# Patient Record
Sex: Female | Born: 1990 | Race: Black or African American | Hispanic: No | Marital: Married | Smoking: Former smoker
Health system: Southern US, Community
[De-identification: ages and names within clinical notes are randomized; demographics above are authoritative.]

## PROBLEM LIST (undated history)

## (undated) DIAGNOSIS — F32A Depression, unspecified: Secondary | ICD-10-CM

## (undated) DIAGNOSIS — B9689 Other specified bacterial agents as the cause of diseases classified elsewhere: Secondary | ICD-10-CM

## (undated) DIAGNOSIS — K5090 Crohn's disease, unspecified, without complications: Secondary | ICD-10-CM

## (undated) DIAGNOSIS — N760 Acute vaginitis: Secondary | ICD-10-CM

## (undated) DIAGNOSIS — F329 Major depressive disorder, single episode, unspecified: Secondary | ICD-10-CM

## (undated) DIAGNOSIS — J45909 Unspecified asthma, uncomplicated: Secondary | ICD-10-CM

## (undated) DIAGNOSIS — O039 Complete or unspecified spontaneous abortion without complication: Secondary | ICD-10-CM

## (undated) HISTORY — PX: DILATION AND CURETTAGE OF UTERUS: SHX78

## (undated) HISTORY — DX: Unspecified asthma, uncomplicated: J45.909

## (undated) HISTORY — PX: TONSILLECTOMY: SUR1361

## (undated) HISTORY — DX: Crohn's disease, unspecified, without complications: K50.90

## (undated) HISTORY — DX: Complete or unspecified spontaneous abortion without complication: O03.9

---
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
## 2009-06-17 ENCOUNTER — Emergency Department: Payer: Self-pay | Admitting: Emergency Medicine

## 2009-12-27 ENCOUNTER — Emergency Department: Payer: Self-pay | Admitting: Emergency Medicine

## 2010-03-20 ENCOUNTER — Emergency Department: Payer: Self-pay | Admitting: Emergency Medicine

## 2010-05-19 ENCOUNTER — Emergency Department: Payer: Self-pay | Admitting: Unknown Physician Specialty

## 2010-07-04 ENCOUNTER — Emergency Department: Payer: Self-pay | Admitting: Emergency Medicine

## 2011-02-17 ENCOUNTER — Emergency Department: Payer: Self-pay | Admitting: Emergency Medicine

## 2011-04-03 ENCOUNTER — Emergency Department: Payer: Self-pay | Admitting: Emergency Medicine

## 2011-04-03 LAB — COMPREHENSIVE METABOLIC PANEL
Alkaline Phosphatase: 54 U/L (ref 50–136)
Calcium, Total: 9.1 mg/dL (ref 8.5–10.1)
Co2: 25 mmol/L (ref 21–32)
Creatinine: 0.62 mg/dL (ref 0.60–1.30)
EGFR (African American): >60
EGFR (Non-African Amer.): >60
Osmolality: 281 (ref 275–301)
SGOT(AST): 23 U/L (ref 15–37)
SGPT (ALT): 36 U/L

## 2011-04-03 LAB — URINALYSIS, COMPLETE
Ketone: NEGATIVE
Nitrite: NEGATIVE
Ph: 5 (ref 4.5–8.0)
Protein: NEGATIVE
Specific Gravity: 1.023 (ref 1.003–1.030)

## 2011-04-03 LAB — CBC
HCT: 37.4 % (ref 35.0–47.0)
MCH: 29 pg (ref 26.0–34.0)
MCV: 90 fL (ref 80–100)
Platelet: 276 10*3/uL (ref 150–440)
RBC: 4.18 10*6/uL (ref 3.80–5.20)
WBC: 11.5 10*3/uL — ABNORMAL HIGH (ref 3.6–11.0)

## 2011-04-03 LAB — PREGNANCY, URINE: Pregnancy Test, Urine: NEGATIVE m[IU]/mL

## 2011-05-28 ENCOUNTER — Emergency Department: Payer: Self-pay | Admitting: *Deleted

## 2011-05-31 LAB — BETA STREP CULTURE(ARMC)

## 2011-11-03 ENCOUNTER — Emergency Department: Payer: Self-pay | Admitting: Emergency Medicine

## 2011-11-04 LAB — URINALYSIS, COMPLETE
Bacteria: NONE SEEN
Bilirubin,UR: NEGATIVE
Blood: NEGATIVE
Nitrite: NEGATIVE
Ph: 5 (ref 4.5–8.0)
Protein: NEGATIVE

## 2011-11-04 LAB — PREGNANCY, URINE: Pregnancy Test, Urine: NEGATIVE m[IU]/mL

## 2012-02-09 ENCOUNTER — Emergency Department: Payer: Self-pay | Admitting: Emergency Medicine

## 2012-02-12 LAB — BETA STREP CULTURE(ARMC)

## 2012-03-21 ENCOUNTER — Emergency Department: Payer: Self-pay | Admitting: Emergency Medicine

## 2012-03-21 LAB — COMPREHENSIVE METABOLIC PANEL
Alkaline Phosphatase: 74 U/L (ref 50–136)
BUN: 7 mg/dL (ref 7–18)
Bilirubin,Total: 0.5 mg/dL (ref 0.2–1.0)
Calcium, Total: 9.1 mg/dL (ref 8.5–10.1)
Chloride: 105 mmol/L (ref 98–107)
Co2: 25 mmol/L (ref 21–32)
Creatinine: 0.61 mg/dL (ref 0.60–1.30)
EGFR (African American): >60
EGFR (Non-African Amer.): >60
Glucose: 96 mg/dL (ref 65–99)
Potassium: 3.6 mmol/L (ref 3.5–5.1)
SGPT (ALT): 24 U/L (ref 12–78)
Total Protein: 7.4 g/dL (ref 6.4–8.2)

## 2012-03-21 LAB — URINALYSIS, COMPLETE
Bilirubin,UR: NEGATIVE
Blood: NEGATIVE
Glucose,UR: NEGATIVE mg/dL (ref 0–75)
Ketone: NEGATIVE
Leukocyte Esterase: NEGATIVE
Nitrite: NEGATIVE
Ph: 6 (ref 4.5–8.0)
Protein: NEGATIVE
RBC,UR: 1 /HPF (ref 0–5)
Specific Gravity: 1.014 (ref 1.003–1.030)
WBC UR: 3 /HPF (ref 0–5)

## 2012-03-21 LAB — CBC
MCH: 29 pg (ref 26.0–34.0)
MCHC: 32.7 g/dL (ref 32.0–36.0)
MCV: 89 fL (ref 80–100)
Platelet: 239 10*3/uL (ref 150–440)
RBC: 4.8 10*6/uL (ref 3.80–5.20)
RDW: 14.4 % (ref 11.5–14.5)

## 2012-03-21 LAB — HCG, QUANTITATIVE, PREGNANCY: Beta Hcg, Quant.: <1 m[IU]/mL — ABNORMAL LOW

## 2012-07-25 ENCOUNTER — Emergency Department: Payer: Self-pay | Admitting: Emergency Medicine

## 2012-07-25 LAB — CBC WITH DIFFERENTIAL/PLATELET
Basophil #: 0.2 10*3/uL — ABNORMAL HIGH (ref 0.0–0.1)
Basophil %: 1.1 %
Eosinophil #: 0.6 10*3/uL (ref 0.0–0.7)
Eosinophil %: 4.3 %
HCT: 41.6 % (ref 35.0–47.0)
HGB: 14.1 g/dL (ref 12.0–16.0)
Lymphocyte #: 4 10*3/uL — ABNORMAL HIGH (ref 1.0–3.6)
Lymphocyte %: 28.4 %
MCH: 29.6 pg (ref 26.0–34.0)
MCHC: 34 g/dL (ref 32.0–36.0)
MCV: 87 fL (ref 80–100)
Monocyte %: 6.3 %
Neutrophil #: 8.4 10*3/uL — ABNORMAL HIGH (ref 1.4–6.5)
Neutrophil %: 59.9 %
RBC: 4.77 10*6/uL (ref 3.80–5.20)
RDW: 14.1 % (ref 11.5–14.5)
WBC: 14.1 10*3/uL — ABNORMAL HIGH (ref 3.6–11.0)

## 2012-07-25 LAB — COMPREHENSIVE METABOLIC PANEL
Albumin: 3.7 g/dL (ref 3.4–5.0)
Anion Gap: 3 — ABNORMAL LOW (ref 7–16)
BUN: 9 mg/dL (ref 7–18)
Calcium, Total: 9.3 mg/dL (ref 8.5–10.1)
Chloride: 107 mmol/L (ref 98–107)
Co2: 29 mmol/L (ref 21–32)
EGFR (African American): >60
Glucose: 79 mg/dL (ref 65–99)
SGOT(AST): 23 U/L (ref 15–37)
SGPT (ALT): 28 U/L (ref 12–78)
Sodium: 139 mmol/L (ref 136–145)

## 2012-07-25 LAB — URINALYSIS, COMPLETE
Bacteria: NONE SEEN
Glucose,UR: NEGATIVE mg/dL (ref 0–75)
Ketone: NEGATIVE
Leukocyte Esterase: NEGATIVE
Nitrite: NEGATIVE
Ph: 6 (ref 4.5–8.0)
Protein: NEGATIVE
RBC,UR: 1 /HPF (ref 0–5)

## 2012-07-25 LAB — GC/CHLAMYDIA PROBE AMP

## 2013-01-01 ENCOUNTER — Emergency Department: Payer: Self-pay | Admitting: Emergency Medicine

## 2013-01-01 LAB — COMPREHENSIVE METABOLIC PANEL
Albumin: 4.3 g/dL (ref 3.4–5.0)
Alkaline Phosphatase: 88 U/L (ref 50–136)
Calcium, Total: 9.8 mg/dL (ref 8.5–10.1)
Chloride: 104 mmol/L (ref 98–107)
Creatinine: 0.73 mg/dL (ref 0.60–1.30)
EGFR (African American): >60
EGFR (Non-African Amer.): >60
Glucose: 104 mg/dL — ABNORMAL HIGH (ref 65–99)
SGOT(AST): 26 U/L (ref 15–37)
Sodium: 136 mmol/L (ref 136–145)
Total Protein: 8.4 g/dL — ABNORMAL HIGH (ref 6.4–8.2)

## 2013-01-01 LAB — URINALYSIS, COMPLETE
Leukocyte Esterase: NEGATIVE
Nitrite: NEGATIVE
Ph: 7 (ref 4.5–8.0)
Protein: NEGATIVE
RBC,UR: 1 /HPF (ref 0–5)
Specific Gravity: 1.021 (ref 1.003–1.030)
Squamous Epithelial: 12
WBC UR: 1 /HPF (ref 0–5)

## 2013-01-01 LAB — DRUG SCREEN, URINE
Amphetamines, Ur Screen: NEGATIVE (ref ?–1000)
Barbiturates, Ur Screen: NEGATIVE (ref ?–200)
Benzodiazepine, Ur Scrn: NEGATIVE (ref ?–200)
Cannabinoid 50 Ng, Ur ~~LOC~~: NEGATIVE (ref ?–50)
Methadone, Ur Screen: NEGATIVE (ref ?–300)
Opiate, Ur Screen: NEGATIVE (ref ?–300)
Phencyclidine (PCP) Ur S: NEGATIVE (ref ?–25)
Tricyclic, Ur Screen: NEGATIVE (ref ?–1000)

## 2013-01-01 LAB — CBC
MCH: 30.2 pg (ref 26.0–34.0)
MCV: 88 fL (ref 80–100)
Platelet: 313 10*3/uL (ref 150–440)
RDW: 14.2 % (ref 11.5–14.5)

## 2013-01-01 LAB — WET PREP, GENITAL

## 2013-01-01 LAB — GC/CHLAMYDIA PROBE AMP

## 2013-02-21 ENCOUNTER — Emergency Department: Payer: Self-pay | Admitting: Emergency Medicine

## 2013-02-21 LAB — CBC
HCT: 38.5 % (ref 35.0–47.0)
HGB: 13.2 g/dL (ref 12.0–16.0)
MCH: 30.3 pg (ref 26.0–34.0)
Platelet: 236 10*3/uL (ref 150–440)
RDW: 14 % (ref 11.5–14.5)
WBC: 15.1 10*3/uL — ABNORMAL HIGH (ref 3.6–11.0)

## 2013-02-21 LAB — URINALYSIS, COMPLETE
Bacteria: NONE SEEN
Bilirubin,UR: NEGATIVE
Blood: NEGATIVE
Ph: 7 (ref 4.5–8.0)
Protein: NEGATIVE
RBC,UR: 1 /HPF (ref 0–5)
Specific Gravity: 1.008 (ref 1.003–1.030)

## 2013-02-21 LAB — HCG, QUANTITATIVE, PREGNANCY: Beta Hcg, Quant.: 11734 m[IU]/mL — ABNORMAL HIGH

## 2013-02-21 LAB — WET PREP, GENITAL

## 2013-03-14 ENCOUNTER — Emergency Department: Payer: Self-pay | Admitting: Emergency Medicine

## 2013-03-15 LAB — URINALYSIS, COMPLETE
BILIRUBIN, UR: NEGATIVE
Glucose,UR: NEGATIVE mg/dL (ref 0–75)
Ketone: NEGATIVE
Leukocyte Esterase: NEGATIVE
NITRITE: NEGATIVE
Ph: 8 (ref 4.5–8.0)
Protein: NEGATIVE
SPECIFIC GRAVITY: 1.009 (ref 1.003–1.030)
Squamous Epithelial: 4
WBC UR: 1 /HPF (ref 0–5)

## 2013-03-15 LAB — GC/CHLAMYDIA PROBE AMP

## 2013-03-15 LAB — HCG, QUANTITATIVE, PREGNANCY: Beta Hcg, Quant.: 2987 m[IU]/mL — ABNORMAL HIGH

## 2013-03-15 LAB — CBC
HCT: 36.9 % (ref 35.0–47.0)
HGB: 12.4 g/dL (ref 12.0–16.0)
MCH: 30.1 pg (ref 26.0–34.0)
MCHC: 33.7 g/dL (ref 32.0–36.0)
MCV: 89 fL (ref 80–100)
Platelet: 254 10*3/uL (ref 150–440)
RBC: 4.13 10*6/uL (ref 3.80–5.20)
RDW: 13.9 % (ref 11.5–14.5)
WBC: 19.2 10*3/uL — ABNORMAL HIGH (ref 3.6–11.0)

## 2013-03-15 LAB — WET PREP, GENITAL

## 2013-03-24 ENCOUNTER — Emergency Department: Payer: Self-pay | Admitting: Emergency Medicine

## 2013-03-24 LAB — CBC
HCT: 40.9 % (ref 35.0–47.0)
HGB: 13.7 g/dL (ref 12.0–16.0)
MCH: 30.1 pg (ref 26.0–34.0)
MCHC: 33.6 g/dL (ref 32.0–36.0)
MCV: 90 fL (ref 80–100)
PLATELETS: 282 10*3/uL (ref 150–440)
RBC: 4.56 10*6/uL (ref 3.80–5.20)
RDW: 14.5 % (ref 11.5–14.5)
WBC: 16.9 10*3/uL — ABNORMAL HIGH (ref 3.6–11.0)

## 2013-03-24 LAB — HCG, QUANTITATIVE, PREGNANCY: Beta Hcg, Quant.: 10 m[IU]/mL — ABNORMAL HIGH

## 2013-05-11 ENCOUNTER — Emergency Department: Payer: Self-pay | Admitting: Emergency Medicine

## 2013-05-11 LAB — COMPREHENSIVE METABOLIC PANEL
ALK PHOS: 59 U/L
ALT: 23 U/L (ref 12–78)
Albumin: 3.6 g/dL (ref 3.4–5.0)
Anion Gap: 7 (ref 7–16)
BUN: 7 mg/dL (ref 7–18)
Bilirubin,Total: 0.2 mg/dL (ref 0.2–1.0)
Calcium, Total: 8.5 mg/dL (ref 8.5–10.1)
Chloride: 109 mmol/L — ABNORMAL HIGH (ref 98–107)
Co2: 27 mmol/L (ref 21–32)
Creatinine: 0.61 mg/dL (ref 0.60–1.30)
EGFR (African American): >60
Glucose: 111 mg/dL — ABNORMAL HIGH (ref 65–99)
Osmolality: 284 (ref 275–301)
Potassium: 3.1 mmol/L — ABNORMAL LOW (ref 3.5–5.1)
SGOT(AST): 19 U/L (ref 15–37)
Sodium: 143 mmol/L (ref 136–145)
TOTAL PROTEIN: 6.9 g/dL (ref 6.4–8.2)

## 2013-05-11 LAB — GC/CHLAMYDIA PROBE AMP

## 2013-05-11 LAB — URINALYSIS, COMPLETE
BACTERIA: NONE SEEN
Bilirubin,UR: NEGATIVE
Blood: NEGATIVE
GLUCOSE, UR: NEGATIVE mg/dL (ref 0–75)
Ketone: NEGATIVE
Leukocyte Esterase: NEGATIVE
Nitrite: NEGATIVE
PROTEIN: NEGATIVE
Ph: 5 (ref 4.5–8.0)
RBC,UR: 1 /HPF (ref 0–5)
SPECIFIC GRAVITY: 1.023 (ref 1.003–1.030)
Squamous Epithelial: 3
WBC UR: <1 /HPF (ref 0–5)

## 2013-05-11 LAB — CBC
HCT: 39.6 % (ref 35.0–47.0)
HGB: 13.5 g/dL (ref 12.0–16.0)
MCH: 30.5 pg (ref 26.0–34.0)
MCHC: 34 g/dL (ref 32.0–36.0)
MCV: 90 fL (ref 80–100)
Platelet: 226 10*3/uL (ref 150–440)
RBC: 4.41 10*6/uL (ref 3.80–5.20)
RDW: 13.7 % (ref 11.5–14.5)
WBC: 12.1 10*3/uL — ABNORMAL HIGH (ref 3.6–11.0)

## 2013-05-11 LAB — LIPASE, BLOOD: Lipase: 92 U/L (ref 73–393)

## 2013-05-11 LAB — HCG, QUANTITATIVE, PREGNANCY: Beta Hcg, Quant.: <1 m[IU]/mL — ABNORMAL LOW

## 2013-05-11 LAB — WET PREP, GENITAL

## 2013-07-20 ENCOUNTER — Emergency Department: Payer: Self-pay | Admitting: Emergency Medicine

## 2013-07-20 LAB — CBC WITH DIFFERENTIAL/PLATELET
Basophil #: 0.1 10*3/uL (ref 0.0–0.1)
Basophil %: 0.7 %
EOS ABS: 0.6 10*3/uL (ref 0.0–0.7)
Eosinophil %: 3.4 %
HCT: 40.9 % (ref 35.0–47.0)
HGB: 13.5 g/dL (ref 12.0–16.0)
Lymphocyte #: 6.2 10*3/uL — ABNORMAL HIGH (ref 1.0–3.6)
Lymphocyte %: 34.7 %
MCH: 29.8 pg (ref 26.0–34.0)
MCHC: 33.1 g/dL (ref 32.0–36.0)
MCV: 90 fL (ref 80–100)
MONO ABS: 1.3 x10 3/mm — AB (ref 0.2–0.9)
Monocyte %: 7 %
NEUTROS ABS: 9.6 10*3/uL — AB (ref 1.4–6.5)
NEUTROS PCT: 54.2 %
PLATELETS: 214 10*3/uL (ref 150–440)
RBC: 4.54 10*6/uL (ref 3.80–5.20)
RDW: 14.1 % (ref 11.5–14.5)
WBC: 17.8 10*3/uL — ABNORMAL HIGH (ref 3.6–11.0)

## 2013-07-20 LAB — COMPREHENSIVE METABOLIC PANEL
ALK PHOS: 57 U/L
Albumin: 3.7 g/dL (ref 3.4–5.0)
Anion Gap: 2 — ABNORMAL LOW (ref 7–16)
BUN: 5 mg/dL — ABNORMAL LOW (ref 7–18)
Bilirubin,Total: 0.2 mg/dL (ref 0.2–1.0)
CALCIUM: 9.1 mg/dL (ref 8.5–10.1)
Chloride: 107 mmol/L (ref 98–107)
Co2: 29 mmol/L (ref 21–32)
Creatinine: 0.66 mg/dL (ref 0.60–1.30)
EGFR (African American): >60
EGFR (Non-African Amer.): >60
Glucose: 89 mg/dL (ref 65–99)
Osmolality: 272 (ref 275–301)
Potassium: 3.7 mmol/L (ref 3.5–5.1)
SGOT(AST): 23 U/L (ref 15–37)
SGPT (ALT): 23 U/L (ref 12–78)
Sodium: 138 mmol/L (ref 136–145)
Total Protein: 7 g/dL (ref 6.4–8.2)

## 2013-07-20 LAB — URINALYSIS, COMPLETE
BACTERIA: NONE SEEN
BILIRUBIN, UR: NEGATIVE
BLOOD: NEGATIVE
GLUCOSE, UR: NEGATIVE mg/dL (ref 0–75)
KETONE: NEGATIVE
Leukocyte Esterase: NEGATIVE
NITRITE: NEGATIVE
PH: 5 (ref 4.5–8.0)
Protein: NEGATIVE
SPECIFIC GRAVITY: 1.021 (ref 1.003–1.030)
Squamous Epithelial: 8
WBC UR: 2 /HPF (ref 0–5)

## 2013-07-20 LAB — LIPASE, BLOOD: LIPASE: 106 U/L (ref 73–393)

## 2013-07-20 LAB — GC/CHLAMYDIA PROBE AMP

## 2013-07-20 LAB — WET PREP, GENITAL

## 2013-07-24 ENCOUNTER — Emergency Department: Payer: Self-pay | Admitting: Emergency Medicine

## 2013-07-24 LAB — CBC WITH DIFFERENTIAL/PLATELET
BASOS ABS: 0.1 10*3/uL (ref 0.0–0.1)
Basophil %: 0.4 %
Eosinophil #: 0.3 10*3/uL (ref 0.0–0.7)
Eosinophil %: 2 %
HCT: 42 % (ref 35.0–47.0)
HGB: 14 g/dL (ref 12.0–16.0)
LYMPHS ABS: 3.8 10*3/uL — AB (ref 1.0–3.6)
Lymphocyte %: 26.5 %
MCH: 29.8 pg (ref 26.0–34.0)
MCHC: 33.4 g/dL (ref 32.0–36.0)
MCV: 89 fL (ref 80–100)
Monocyte #: 0.8 x10 3/mm (ref 0.2–0.9)
Monocyte %: 5.3 %
NEUTROS ABS: 9.5 10*3/uL — AB (ref 1.4–6.5)
Neutrophil %: 65.8 %
Platelet: 230 10*3/uL (ref 150–440)
RBC: 4.71 10*6/uL (ref 3.80–5.20)
RDW: 14.2 % (ref 11.5–14.5)
WBC: 14.4 10*3/uL — ABNORMAL HIGH (ref 3.6–11.0)

## 2013-07-24 LAB — COMPREHENSIVE METABOLIC PANEL
Albumin: 3.7 g/dL (ref 3.4–5.0)
Alkaline Phosphatase: 69 U/L
Anion Gap: 5 — ABNORMAL LOW (ref 7–16)
BUN: 12 mg/dL (ref 7–18)
Bilirubin,Total: 0.6 mg/dL (ref 0.2–1.0)
Calcium, Total: 9.5 mg/dL (ref 8.5–10.1)
Chloride: 101 mmol/L (ref 98–107)
Co2: 30 mmol/L (ref 21–32)
Creatinine: 0.61 mg/dL (ref 0.60–1.30)
EGFR (African American): >60
Glucose: 112 mg/dL — ABNORMAL HIGH (ref 65–99)
OSMOLALITY: 272 (ref 275–301)
POTASSIUM: 3.4 mmol/L — AB (ref 3.5–5.1)
SGOT(AST): 272 U/L — ABNORMAL HIGH (ref 15–37)
SGPT (ALT): 132 U/L — ABNORMAL HIGH (ref 12–78)
Sodium: 136 mmol/L (ref 136–145)
TOTAL PROTEIN: 7.2 g/dL (ref 6.4–8.2)

## 2013-07-24 LAB — DRUG SCREEN, URINE
AMPHETAMINES, UR SCREEN: NEGATIVE (ref ?–1000)
BARBITURATES, UR SCREEN: NEGATIVE (ref ?–200)
BENZODIAZEPINE, UR SCRN: NEGATIVE (ref ?–200)
Cannabinoid 50 Ng, Ur ~~LOC~~: NEGATIVE (ref ?–50)
Cocaine Metabolite,Ur ~~LOC~~: NEGATIVE (ref ?–300)
MDMA (ECSTASY) UR SCREEN: NEGATIVE (ref ?–500)
METHADONE, UR SCREEN: NEGATIVE (ref ?–300)
OPIATE, UR SCREEN: NEGATIVE (ref ?–300)
Phencyclidine (PCP) Ur S: NEGATIVE (ref ?–25)
Tricyclic, Ur Screen: NEGATIVE (ref ?–1000)

## 2013-07-24 LAB — URINALYSIS, COMPLETE
BILIRUBIN, UR: NEGATIVE
Blood: NEGATIVE
GLUCOSE, UR: NEGATIVE mg/dL (ref 0–75)
Ketone: NEGATIVE
LEUKOCYTE ESTERASE: NEGATIVE
NITRITE: NEGATIVE
Ph: 7 (ref 4.5–8.0)
Protein: NEGATIVE
RBC,UR: 1 /HPF (ref 0–5)
SPECIFIC GRAVITY: 1.005 (ref 1.003–1.030)
Squamous Epithelial: 22

## 2013-10-03 DIAGNOSIS — Z6827 Body mass index (BMI) 27.0-27.9, adult: Secondary | ICD-10-CM | POA: Insufficient documentation

## 2013-10-14 ENCOUNTER — Emergency Department: Payer: Self-pay | Admitting: Emergency Medicine

## 2013-10-14 LAB — COMPREHENSIVE METABOLIC PANEL
ALBUMIN: 3.9 g/dL (ref 3.4–5.0)
ALK PHOS: 68 U/L
Anion Gap: 7 (ref 7–16)
BILIRUBIN TOTAL: 0.2 mg/dL (ref 0.2–1.0)
BUN: 8 mg/dL (ref 7–18)
CALCIUM: 9.2 mg/dL (ref 8.5–10.1)
CO2: 26 mmol/L (ref 21–32)
CREATININE: 0.84 mg/dL (ref 0.60–1.30)
Chloride: 106 mmol/L (ref 98–107)
Glucose: 119 mg/dL — ABNORMAL HIGH (ref 65–99)
Osmolality: 277 (ref 275–301)
Potassium: 3.8 mmol/L (ref 3.5–5.1)
SGOT(AST): 14 U/L — ABNORMAL LOW (ref 15–37)
SGPT (ALT): 25 U/L
Sodium: 139 mmol/L (ref 136–145)
Total Protein: 7.9 g/dL (ref 6.4–8.2)

## 2013-10-14 LAB — URINALYSIS, COMPLETE
BILIRUBIN, UR: NEGATIVE
Bacteria: NONE SEEN
GLUCOSE, UR: NEGATIVE mg/dL (ref 0–75)
Ketone: NEGATIVE
LEUKOCYTE ESTERASE: NEGATIVE
Nitrite: NEGATIVE
Ph: 6 (ref 4.5–8.0)
Protein: NEGATIVE
RBC,UR: 362 /HPF (ref 0–5)
Specific Gravity: 1.021 (ref 1.003–1.030)
Squamous Epithelial: 4
WBC UR: 4 /HPF (ref 0–5)

## 2013-10-14 LAB — CBC
HCT: 43.1 % (ref 35.0–47.0)
HGB: 14.1 g/dL (ref 12.0–16.0)
MCH: 29.4 pg (ref 26.0–34.0)
MCHC: 32.6 g/dL (ref 32.0–36.0)
MCV: 90 fL (ref 80–100)
Platelet: 290 10*3/uL (ref 150–440)
RBC: 4.78 10*6/uL (ref 3.80–5.20)
RDW: 13.9 % (ref 11.5–14.5)
WBC: 15.4 10*3/uL — ABNORMAL HIGH (ref 3.6–11.0)

## 2013-10-14 LAB — WET PREP, GENITAL

## 2013-10-14 LAB — GC/CHLAMYDIA PROBE AMP

## 2014-01-06 ENCOUNTER — Observation Stay: Payer: Self-pay | Admitting: Internal Medicine

## 2014-01-06 LAB — CBC WITH DIFFERENTIAL/PLATELET
Basophil #: 0.1 10*3/uL (ref 0.0–0.1)
Basophil %: 0.6 %
Eosinophil #: 0.8 10*3/uL — ABNORMAL HIGH (ref 0.0–0.7)
Eosinophil %: 3.6 %
HCT: 41.7 % (ref 35.0–47.0)
HGB: 13.8 g/dL (ref 12.0–16.0)
LYMPHS ABS: 5 10*3/uL — AB (ref 1.0–3.6)
LYMPHS PCT: 23.9 %
MCH: 30 pg (ref 26.0–34.0)
MCHC: 33.2 g/dL (ref 32.0–36.0)
MCV: 91 fL (ref 80–100)
Monocyte #: 1.3 x10 3/mm — ABNORMAL HIGH (ref 0.2–0.9)
Monocyte %: 6.1 %
NEUTROS PCT: 65.8 %
Neutrophil #: 13.8 10*3/uL — ABNORMAL HIGH (ref 1.4–6.5)
Platelet: 237 10*3/uL (ref 150–440)
RBC: 4.61 10*6/uL (ref 3.80–5.20)
RDW: 13.9 % (ref 11.5–14.5)
WBC: 21 10*3/uL — ABNORMAL HIGH (ref 3.6–11.0)

## 2014-01-06 LAB — URINALYSIS, COMPLETE
Bilirubin,UR: NEGATIVE
Blood: NEGATIVE
GLUCOSE, UR: NEGATIVE mg/dL (ref 0–75)
Ketone: NEGATIVE
Leukocyte Esterase: NEGATIVE
Nitrite: NEGATIVE
Ph: 5 (ref 4.5–8.0)
Protein: NEGATIVE
Specific Gravity: 1.019 (ref 1.003–1.030)
Squamous Epithelial: 5
WBC UR: 1 /HPF (ref 0–5)

## 2014-01-06 LAB — COMPREHENSIVE METABOLIC PANEL
ALBUMIN: 3.4 g/dL (ref 3.4–5.0)
ALT: 25 U/L
AST: 18 U/L (ref 15–37)
Alkaline Phosphatase: 62 U/L
Anion Gap: 11 (ref 7–16)
BUN: 7 mg/dL (ref 7–18)
Bilirubin,Total: 0.2 mg/dL (ref 0.2–1.0)
CALCIUM: 8.3 mg/dL — AB (ref 8.5–10.1)
CO2: 25 mmol/L (ref 21–32)
Chloride: 106 mmol/L (ref 98–107)
Creatinine: 0.81 mg/dL (ref 0.60–1.30)
EGFR (African American): >60
EGFR (Non-African Amer.): >60
Glucose: 112 mg/dL — ABNORMAL HIGH (ref 65–99)
Osmolality: 282 (ref 275–301)
Potassium: 3.2 mmol/L — ABNORMAL LOW (ref 3.5–5.1)
SODIUM: 142 mmol/L (ref 136–145)
Total Protein: 7.3 g/dL (ref 6.4–8.2)

## 2014-01-06 LAB — LIPASE, BLOOD: LIPASE: 105 U/L (ref 73–393)

## 2014-01-06 LAB — PREGNANCY, URINE: Pregnancy Test, Urine: NEGATIVE m[IU]/mL

## 2014-01-06 LAB — GC/CHLAMYDIA PROBE AMP

## 2014-01-06 LAB — CLOSTRIDIUM DIFFICILE(ARMC)

## 2014-01-09 LAB — STOOL CULTURE

## 2014-02-11 ENCOUNTER — Emergency Department: Payer: Self-pay | Admitting: Emergency Medicine

## 2014-02-11 LAB — URINALYSIS, COMPLETE
BACTERIA: NONE SEEN
BILIRUBIN, UR: NEGATIVE
Blood: NEGATIVE
Glucose,UR: NEGATIVE mg/dL (ref 0–75)
Ketone: NEGATIVE
Leukocyte Esterase: NEGATIVE
Nitrite: NEGATIVE
PH: 6 (ref 4.5–8.0)
Protein: NEGATIVE
Specific Gravity: 1.02 (ref 1.003–1.030)
Squamous Epithelial: 6
WBC UR: 1 /HPF (ref 0–5)

## 2014-02-21 ENCOUNTER — Ambulatory Visit: Payer: Self-pay | Admitting: Gastroenterology

## 2014-02-23 DIAGNOSIS — K5090 Crohn's disease, unspecified, without complications: Secondary | ICD-10-CM

## 2014-02-23 HISTORY — DX: Crohn's disease, unspecified, without complications: K50.90

## 2014-03-23 ENCOUNTER — Emergency Department: Payer: Self-pay | Admitting: Emergency Medicine

## 2014-03-23 LAB — URINALYSIS, COMPLETE
BACTERIA: NONE SEEN
BILIRUBIN, UR: NEGATIVE
BLOOD: NEGATIVE
GLUCOSE, UR: NEGATIVE mg/dL (ref 0–75)
Ketone: NEGATIVE
Leukocyte Esterase: NEGATIVE
Nitrite: NEGATIVE
Ph: 5 (ref 4.5–8.0)
Protein: NEGATIVE
RBC,UR: 1 /HPF (ref 0–5)
Specific Gravity: 1.026 (ref 1.003–1.030)
WBC UR: 1 /HPF (ref 0–5)

## 2014-03-23 LAB — CBC WITH DIFFERENTIAL/PLATELET
BASOS ABS: 0 10*3/uL (ref 0.0–0.1)
Basophil %: 0.3 %
Eosinophil #: 0.5 10*3/uL (ref 0.0–0.7)
Eosinophil %: 2.8 %
HCT: 43.3 % (ref 35.0–47.0)
HGB: 14 g/dL (ref 12.0–16.0)
LYMPHS PCT: 22.2 %
Lymphocyte #: 3.6 10*3/uL (ref 1.0–3.6)
MCH: 29 pg (ref 26.0–34.0)
MCHC: 32.4 g/dL (ref 32.0–36.0)
MCV: 90 fL (ref 80–100)
MONOS PCT: 7.7 %
Monocyte #: 1.2 x10 3/mm — ABNORMAL HIGH (ref 0.2–0.9)
Neutrophil #: 10.9 10*3/uL — ABNORMAL HIGH (ref 1.4–6.5)
Neutrophil %: 67 %
PLATELETS: 264 10*3/uL (ref 150–440)
RBC: 4.84 10*6/uL (ref 3.80–5.20)
RDW: 14.5 % (ref 11.5–14.5)
WBC: 16.2 10*3/uL — AB (ref 3.6–11.0)

## 2014-03-23 LAB — COMPREHENSIVE METABOLIC PANEL
ALBUMIN: 3.6 g/dL (ref 3.4–5.0)
ALT: 20 U/L (ref 14–63)
Alkaline Phosphatase: 57 U/L (ref 46–116)
Anion Gap: 7 (ref 7–16)
BILIRUBIN TOTAL: 0.4 mg/dL (ref 0.2–1.0)
BUN: 8 mg/dL (ref 7–18)
Calcium, Total: 9.3 mg/dL (ref 8.5–10.1)
Chloride: 106 mmol/L (ref 98–107)
Co2: 24 mmol/L (ref 21–32)
Creatinine: 0.77 mg/dL (ref 0.60–1.30)
EGFR (Non-African Amer.): >60
GLUCOSE: 97 mg/dL (ref 65–99)
Osmolality: 272 (ref 275–301)
Potassium: 3.6 mmol/L (ref 3.5–5.1)
SGOT(AST): 22 U/L (ref 15–37)
Sodium: 137 mmol/L (ref 136–145)
TOTAL PROTEIN: 7.6 g/dL (ref 6.4–8.2)

## 2014-03-23 LAB — HCG, QUANTITATIVE, PREGNANCY: Beta Hcg, Quant.: 9 m[IU]/mL — ABNORMAL HIGH

## 2014-03-23 LAB — LIPASE, BLOOD: LIPASE: 79 U/L (ref 73–393)

## 2014-03-27 ENCOUNTER — Emergency Department: Payer: Self-pay | Admitting: Emergency Medicine

## 2014-03-27 LAB — BASIC METABOLIC PANEL
ANION GAP: 8 (ref 7–16)
BUN: 11 mg/dL (ref 7–18)
CHLORIDE: 107 mmol/L (ref 98–107)
CO2: 26 mmol/L (ref 21–32)
CREATININE: 0.83 mg/dL (ref 0.60–1.30)
Calcium, Total: 9 mg/dL (ref 8.5–10.1)
EGFR (African American): >60
EGFR (Non-African Amer.): >60
Glucose: 115 mg/dL — ABNORMAL HIGH (ref 65–99)
Osmolality: 282 (ref 275–301)
POTASSIUM: 3.5 mmol/L (ref 3.5–5.1)
SODIUM: 141 mmol/L (ref 136–145)

## 2014-03-27 LAB — CBC
HCT: 41.4 % (ref 35.0–47.0)
HGB: 13.6 g/dL (ref 12.0–16.0)
MCH: 29.4 pg (ref 26.0–34.0)
MCHC: 32.8 g/dL (ref 32.0–36.0)
MCV: 90 fL (ref 80–100)
PLATELETS: 276 10*3/uL (ref 150–440)
RBC: 4.61 10*6/uL (ref 3.80–5.20)
RDW: 14.2 % (ref 11.5–14.5)
WBC: 19.4 10*3/uL — AB (ref 3.6–11.0)

## 2014-03-27 LAB — HCG, QUANTITATIVE, PREGNANCY: BETA HCG, QUANT.: 1 m[IU]/mL

## 2014-06-16 NOTE — Discharge Summary (Signed)
Dates of Admission and Diagnosis:  Date of Admission 06-Jan-2014   Date of Discharge 08-Jan-2014   Admitting Diagnosis blood in stool   Final Diagnosis Bloody diarrhea. possibel irritable bowel syndrome Exercise indused asthma chronic abdominal pain    Chief Complaint/History of Present Illness a 24 year old African American female who presents to the Emergency Department complaining of abdominal pain for 1 week. The patient has also had bloody diarrhea and some nausea. She admits to some subjective fevers and has seen blood in her stools. The diarrhea is watery, yet painful. She has a remote history of pelvic inflammatory disease and received treatment for that; she denies that her symptoms are similar in any way. Due to her intractable pain and the unclear etiology, the Emergency Department called for admission.   Allergies:  Amoxicillin: Rash  Pertinent Past History:  Pertinent Past History Exercise-induced asthma   Hospital Course:  Hospital Course 24 year old female admitted for abdominal pain and painful bloody diarrhea.   *  Diarrhea.   C. difficile- negative.  afebrile now,   a concern for inflammatory bowel disease/ irritable bowel syndrome.   IV fluids and supportive care.   Appreciated GI consult- Colonoscopy done    No gross findings, Internal hemorrhoids, Biopsy collected.     Suggest to D/c home. Follwo win GI clinic next week for report.  * migraine- worse due to morphin inj, give fioricet.  *  Leukocytosis.  likely inflammatory.  * Exercise-induced asthma. completely stable. The patient has not had to use her inhaler in a very long time.  * Pelvic inflammatory disease.Pt says - this has been ruled out by [employer].     so most likely pain was due to GI origin. *  Deep vein thrombosis prophylaxis - pt is young and ambulatory.   Condition on Discharge Stable   Code Status:  Code Status Full Code   DISCHARGE INSTRUCTIONS HOME MEDS:  Medication  Reconciliation: Patient's Home Medications at Discharge:     Medication Instructions  acetaminophen 325 mg oral tablet  2 tab(s) orally 1 to 4 times a day, As Needed - for Pain     Physician's Instructions:  Diet Regular   Activity Limitations As tolerated   Return to Work Not Applicable   Time frame for Follow Up Appointment 1-2 weeks  Dr. Allen Norris   Other Comments follow in GI office next week.     Allen Norris, Darren(Consultant): [facility], [address], [place], [place], [place] [address], [phone]  TIME SPENT:  Total Time: Greater than 30 minutes   Electronic Signatures: Vaughan Basta (MD)  (Signed [phone] 23:36)  Authored: ADMISSION DATE AND DIAGNOSIS, CHIEF COMPLAINT/HPI, Allergies, PERTINENT PAST HISTORY, HOSPITAL COURSE, DISCHARGE INSTRUCTIONS HOME MEDS, PATIENT INSTRUCTIONS, Follow Up Physician, TIME SPENT   Last Updated: 22-Nov-15 23:36 by Vaughan Basta (MD)

## 2014-06-16 NOTE — Consult Note (Signed)
Chief Complaint:  Subjective/Chief Complaint The patient had diarrhea times 2 yesterday. Reports having a head ache today. Having much less diarrhea since being in the hospital.   VITAL SIGNS/ANCILLARY NOTES: **Vital Signs.:   15-Nov-15 09:23  Vital Signs Type Routine  Temperature Temperature (F) 98.6  Celsius 37  Temperature Source oral  Pulse Pulse 82  Respirations Respirations 18  Systolic BP Systolic BP 95  Diastolic BP (mmHg) Diastolic BP (mmHg) 58  Mean BP 70  Pulse Ox % Pulse Ox % 98  Pulse Ox Activity Level  At rest  Oxygen Delivery Room Air/ 21 %   Brief Assessment:  GEN well developed, well nourished, no acute distress   Respiratory normal resp effort  no use of accessory muscles   Additional Physical Exam Alert and orientated times 3   Lab Results: Routine Micro:  14-Nov-15 16:43   Micro Text Report STOOL COMPREHENSIVE   COMMENT                   HOLDING FOR POSSIBLE PATHOGEN   COMMENT                   NO PATHOGENIC E.COLI DETECTED   COMMENT                   NO CAMPYLOBACTER ANTIGEN DETECTED   ANTIBIOTIC                        Culture Comment HOLDING FOR POSSIBLE PATHOGEN  Culture Comment . NO PATHOGENIC E.COLI DETECTED  Culture Comment    . NO CAMPYLOBACTER ANTIGEN DETECTED  Result(s) reported on 07 Jan 2014 at 09:43AM.   Assessment/Plan:  Assessment/Plan:  Assessment Bloody diarrhea   Plan The patient has been doing well since admission.  She will be set up for a colonoscopy for tomorrow.   Electronic Signatures: Lucilla Lame (MD)  (Signed [phone] 10:54)  Authored: Chief Complaint, VITAL SIGNS/ANCILLARY NOTES, Brief Assessment, Lab Results, Assessment/Plan   Last Updated: 15-Nov-15 10:54 by Lucilla Lame (MD)

## 2014-06-16 NOTE — Consult Note (Signed)
Brief Consult Note: Diagnosis: Patient with abd pain since January that has gotten worse in the last week with now bloody diarrhea and increased WBC's.   Patient was seen by consultant.   Consult note dictated.   Comments: The patient has not had a bowel movement since admission. Now with increased WBC's and lower abd pain and bloody stools. Will continue supportive treatment and plan for a colonoscopy for Monday.  Electronic Signatures: Lucilla Lame (MD)  (Signed [phone] 14:15)  Authored: Brief Consult Note   Last Updated: 14-Nov-15 14:15 by Lucilla Lame (MD)

## 2014-06-16 NOTE — H&P (Signed)
PATIENT NAME:  Kathleen Owen, Kathleen Owen MR#:  [id number] DATE OF BIRTH:  10-30-1990  DATE OF ADMISSION:  01/06/2014  REFERRING PHYSICIAN : Elta Guadeloupe R. Jacqualine Code, MD  PRIMARY CARE PHYSICIAN: Nonlocal.   ADMISSION DIAGNOSIS: Painful diarrhea and abdominal pain.   HISTORY OF PRESENT ILLNESS: This is a 24 year old African American female who presents to the Emergency Department complaining of abdominal pain for 1 week. The patient has also had bloody diarrhea and some nausea. She admits to some subjective fevers and has seen blood in her stools. The diarrhea is watery, yet painful. She has a remote history of pelvic inflammatory disease and received treatment for that; she denies that her symptoms are similar in any way. Due to her intractable pain and the unclear etiology, the Emergency Department called for admission.   REVIEW OF SYSTEMS:  CONSTITUTIONAL: The patient admits to subjective fevers, but denies weakness.  EYES: Denies blurred vision or inflammation.  EARS, NOSE AND THROAT: Denies tinnitus or difficulty swallowing.  RESPIRATORY: Denies cough or shortness of breath.  CARDIOVASCULAR: Denies chest pain or palpitations.  GASTROINTESTINAL: Admits to nausea and abdominal pain, as well as diarrhea and melena and sometimes bright red blood per rectum.  GENITOURINARY: Denies dysuria, increased frequency, or hesitancy.  ENDOCRINE: Denies polyuria or polydipsia.  HEMATOLOGIC AND LYMPHATIC: Admits to easy bruising most of which is on her legs but denies lesions consistent with erythema nodosum.  INTEGUMENT: Denies rashes or lesions.  MUSCULOSKELETAL: Denies myalgias or arthralgias.  NEUROLOGIC: Denies numbness in her extremities or dysarthria.  PSYCHIATRIC: Denies depression or suicidal ideation.   PAST MEDICAL HISTORY: Exercise-induced asthma.   PAST SURGICAL HISTORY: Tonsillectomy and adenoidectomy.   SOCIAL HISTORY: The patient smokes 1/2 pack per day for the last 4 years. She occasionally drinks  alcohol. She denies any drug use.   FAMILY HISTORY: Her great grandmother is deceased of stomach cancer.   MEDICATIONS: None.   ALLERGIES: AMOXICILLIN.   PERTINENT LABORATORY RESULTS AND RADIOGRAPHIC FINDINGS: Urine is negative for infection. White blood cell count is 21, hemoglobin 13.8, hematocrit 41.7, platelet count 237,000. Serum glucose 112, BUN 7, creatinine 0.81, sodium is 142, potassium is 3.2, chloride 106, bicarbonate 25, calcium is 8.3, alkaline phosphatase 62, AST 18, ALT 25, serum albumin is 3.4, lipase is 105. Urine pregnancy test is negative. C. difficile antigen is negative, as well.   CT of the abdomen and pelvis with contrast shows no acute abdominal or pelvic pathology.   PHYSICAL EXAMINATION:  VITAL SIGNS: Temperature is 98.3, pulse 78, respirations 15, blood pressure 118/78, pulse oximetry 100% on room air.  GENERAL: The patient is alert and oriented x 3, in mild discomfort.  HEENT: Normocephalic, atraumatic. Pupils equal, round, and reactive to light and accommodation. Extraocular movements are intact.  Mucous membranes are moist.  NECK: Trachea is midline. No adenopathy.  CHEST: Symmetric and atraumatic.  CARDIOVASCULAR: Regular rate and rhythm. Normal S1, S2. No rubs, clicks, or murmurs appreciated.  LUNGS: Clear to auscultation bilaterally. Normal effort and excursion.  ABDOMEN: Positive bowel sounds. Soft. Diffusely tender, but without guarding or rebound. There is no hepatosplenomegaly. There is no pinpoint tenderness at McBurney point. Murphy sign is negative. The patient does not specifically have periumbilical pain, nor does she have suprapubic pain.  GENITOURINARY: Deferred.  MUSCULOSKELETAL: The patient moves all 4 extremities equally. There is 5/5 strength in the upper and lower extremities bilaterally.  SKIN: No rashes or lesions.  NEUROLOGIC: Cranial nerves II through XII are grossly intact.  PSYCHIATRIC: Mood is  normal. Affect is congruent.   ASSESSMENT  AND PLAN: This is a 24 year old female admitted for abdominal pain and painful bloody diarrhea.  1.  Diarrhea. The patient's stool is watery and occasionally she has bright red blood per rectum, which is painful. She has no history of constipation or trauma to her genitalia, including her anus. The patient has no sick contacts, nor has she has been hunting recently or been interacting with daycare age children to suggest any infectious etiology. The patient's C. difficile assay was negative. She is afebrile now, but admits to subjective fevers. She does not currently meet sepsis criteria, but I have a concern for inflammatory bowel disease. Our goal currently is to manage the patient's cramping, and prevent dehydration by giving her intravenous fluid. Gastrointestinal consult has been ordered.  2.  Leukocytosis. There is a normal differential to the patient's complete blood count. Her leukocytosis is likely inflammatory.  3.  Exercise-induced asthma. This is completely stable. The patient has not had to use her inhaler in a very long time.  4.  Pelvic inflammatory disease. The patient declined a pelvic exam at this time; however, she denies any pelvic symptoms. There is a urine gonorrhea and Chlamydia test pending.  5.  Overweight. The patient's BMI is 28.4; I have encouraged a balanced diet and exercise.  6.  Deep vein thrombosis prophylaxis with SCDs. Will defer anticoagulation at this time, as the patient has bleeding per rectum.  7.  Gastrointestinal prophylaxis is unnecessary, as the patient is not critically ill.  8.  The patient is a Full Code.   TIME SPENT ON ADMISSION ORDERS AND PATIENT CARE: Approximately 35 minutes.     ____________________________ Norva Riffle. Marcille Blanco, MD msd:MT D: 01/06/2014 08:02:06 ET T: 01/06/2014 08:17:24 ET JOB#: [id number]  cc: Norva Riffle. Marcille Blanco, MD, <Dictator> Norva Riffle Robert Sunga MD ELECTRONICALLY SIGNED 01/11/2014 1:29

## 2014-06-16 NOTE — Consult Note (Signed)
PATIENT NAME:  Kathleen Owen, Kathleen Owen MR#:  [id number] DATE OF BIRTH:  08-Apr-1990  DATE OF CONSULTATION:  01/06/2014  CONSULTING PHYSICIAN:  Lucilla Lame, MD  CONSULTING SERVICE: Gastroenterology.    REASON FOR CONSULTATION: Bloody diarrhea.   HISTORY OF PRESENT ILLNESS: This patient is a 24 year old woman who states that she has had abdominal pain since January. She had a miscarriage in January and it was her first pregnancy. The patient states that since then, she has had abdominal pain and has been treated multiple times for PID. The patient states that she is not sure that she actually had PID, but was continually treated for it and states that the pain has not gotten better. Approximately 1 month ago, she started to have diarrhea and within the last week, she has been having some nausea with worsening diarrhea. The patient now reports that the diarrhea has been watery and she had noticed some bleeding with blood mixed with the stools. The patient came to the ER because of intractable pain and diarrhea with rectal bleeding.   PAST MEDICAL HISTORY: Asthma, miscarriage.   SOCIAL HISTORY: The patient smokes one half pack of cigarettes a day. She drinks occasionally.   FAMILY HISTORY: Noncontributory.   MEDICATIONS: None.   ALLERGIES: AMOXICILLIN.   REVIEW OF SYSTEMS: A 10 point review of systems is negative except what was stated above.   PHYSICAL EXAMINATION: VITAL SIGNS: Temperature 98.2, pulse 79, respirations 18, blood pressure 107/69, pulse oximetry 99%.   GENERAL: The patient is sitting in bed in no apparent distress. Alert, oriented x 3.   HEENT: Normocephalic, atraumatic. Extraocular motor intact. Pupils equally round and reactive to light and accommodation without JVD, without lymphadenopathy.   LUNGS: Clear to auscultation bilaterally.   HEART: Regular rate and rhythm without murmurs, rubs or gallops.   ABDOMEN: Soft, nondistended. Mild tenderness diffusely without  hepatosplenomegaly, without rebound, without guarding.   EXTREMITIES: Without cyanosis, clubbing or edema.   NEUROLOGICAL: Grossly intact.   PSYCHIATRIC: The mood normal.   ANCILLARY SERVICES: The patient's white cell count upon admission was 21,000 with normal, hemoglobin and hematocrit. The patient's C. difficile was negative and her LFTs were normal. The patient also had a CT scan of the abdomen that did not show any acute abdominal pelvic pathology.   ASSESSMENT AND PLAN: This patient is a 24 year old woman with 10 months of abdominal pain. The patient now has been having diarrhea for the last month, which has gotten worse in the last week. The patient states she has lost approximately 10 pounds. The patient was concerned because of her bloody diarrhea. She has not had any diarrhea since being admitted this morning and states that the pain is tolerable at the present time. The patient will be set up for a colonoscopy on Monday to rule out inflammatory bowel disease. She does report a long history of having to go to the bathroom shortly after eating, which is worse with stress which indicates the patient likely has underlying irritable bowel syndrome in addition to what is acutely going on with her elevated white cell count and her bloody diarrhea.   Thank you very much for involving me in the care of this patient. If you have any questions, please do not hesitate to call.     ____________________________ Lucilla Lame, MD dw:TT D: 01/06/2014 15:43:14 ET T: 01/06/2014 16:09:35 ET JOB#: [id number]  cc: Lucilla Lame, MD, <Dictator> Lucilla Lame MD ELECTRONICALLY SIGNED 01/07/2014 8:04

## 2014-06-18 LAB — SURGICAL PATHOLOGY

## 2014-06-21 ENCOUNTER — Emergency Department: Admit: 2014-06-21 | Disposition: A | Discharge status: Home / Self Care | Payer: Self-pay | Admitting: Internal Medicine

## 2014-07-15 ENCOUNTER — Emergency Department
Admission: EM | Admit: 2014-07-15 | Discharge: 2014-07-15 | Disposition: A | Discharge status: Home / Self Care | Payer: Self-pay | Attending: Emergency Medicine | Admitting: Emergency Medicine

## 2014-07-15 ENCOUNTER — Emergency Department: Payer: Self-pay

## 2014-07-15 DIAGNOSIS — O200 Threatened abortion: Secondary | ICD-10-CM

## 2014-07-15 DIAGNOSIS — B9689 Other specified bacterial agents as the cause of diseases classified elsewhere: Secondary | ICD-10-CM

## 2014-07-15 DIAGNOSIS — N72 Inflammatory disease of cervix uteri: Secondary | ICD-10-CM

## 2014-07-15 DIAGNOSIS — N760 Acute vaginitis: Secondary | ICD-10-CM

## 2014-07-15 DIAGNOSIS — O23511 Infections of cervix in pregnancy, first trimester: Secondary | ICD-10-CM | POA: Insufficient documentation

## 2014-07-15 DIAGNOSIS — Z79899 Other long term (current) drug therapy: Secondary | ICD-10-CM | POA: Insufficient documentation

## 2014-07-15 DIAGNOSIS — O23591 Infection of other part of genital tract in pregnancy, first trimester: Secondary | ICD-10-CM | POA: Insufficient documentation

## 2014-07-15 DIAGNOSIS — R109 Unspecified abdominal pain: Secondary | ICD-10-CM

## 2014-07-15 DIAGNOSIS — Z3A01 Less than 8 weeks gestation of pregnancy: Secondary | ICD-10-CM | POA: Insufficient documentation

## 2014-07-15 DIAGNOSIS — O26899 Other specified pregnancy related conditions, unspecified trimester: Secondary | ICD-10-CM

## 2014-07-15 DIAGNOSIS — Z880 Allergy status to penicillin: Secondary | ICD-10-CM | POA: Insufficient documentation

## 2014-07-15 DIAGNOSIS — Z3491 Encounter for supervision of normal pregnancy, unspecified, first trimester: Secondary | ICD-10-CM

## 2014-07-15 LAB — URINALYSIS COMPLETE WITH MICROSCOPIC (ARMC ONLY)
Bilirubin Urine: NEGATIVE
Glucose, UA: NEGATIVE mg/dL
Hgb urine dipstick: NEGATIVE
KETONES UR: NEGATIVE mg/dL
Nitrite: NEGATIVE
Protein, ur: 30 mg/dL — AB
Specific Gravity, Urine: 1.029 (ref 1.005–1.030)
pH: 6 (ref 5.0–8.0)

## 2014-07-15 LAB — CBC
HCT: 37.7 % (ref 35.0–47.0)
Hemoglobin: 12.5 g/dL (ref 12.0–16.0)
MCH: 30 pg (ref 26.0–34.0)
MCHC: 33.2 g/dL (ref 32.0–36.0)
MCV: 90.3 fL (ref 80.0–100.0)
Platelets: 256 10*3/uL (ref 150–440)
RBC: 4.18 MIL/uL (ref 3.80–5.20)
RDW: 13.8 % (ref 11.5–14.5)
WBC: 19.7 10*3/uL — AB (ref 3.6–11.0)

## 2014-07-15 LAB — ABO/RH
ABO/RH(D): A POS
ABO/RH(D): A POS

## 2014-07-15 LAB — WET PREP, GENITAL: TRICH WET PREP: NONE SEEN

## 2014-07-15 LAB — CHLAMYDIA/NGC RT PCR (ARMC ONLY)
CHLAMYDIA TR: NOT DETECTED
N GONORRHOEAE: NOT DETECTED

## 2014-07-15 LAB — HCG, QUANTITATIVE, PREGNANCY: hCG, Beta Chain, Quant, S: 3035 m[IU]/mL — ABNORMAL HIGH (ref <5)

## 2014-07-15 MED ORDER — PRENATAL VITAMINS 0.8 MG PO TABS: 1.0000 | ORAL_TABLET | Freq: Every day | ORAL | Status: DC

## 2014-07-15 MED ORDER — CEFTRIAXONE SODIUM 1 G IJ SOLR
500.0000 mg | Freq: Once | INTRAMUSCULAR | Status: AC
  Administered 2014-07-15: 500 mg via INTRAMUSCULAR

## 2014-07-15 MED ORDER — AZITHROMYCIN 250 MG PO TABS
ORAL_TABLET | ORAL | Status: AC
  Administered 2014-07-15: 1000 mg via ORAL
  Filled 2014-07-15: qty 4

## 2014-07-15 MED ORDER — AZITHROMYCIN 250 MG PO TABS
1000.0000 mg | ORAL_TABLET | Freq: Once | ORAL | Status: AC
  Administered 2014-07-15: 1000 mg via ORAL

## 2014-07-15 MED ORDER — METRONIDAZOLE 0.75 % VA GEL
1.0000 | Freq: Two times a day (BID) | VAGINAL | Status: AC
Start: 2014-07-15 — End: 2014-07-22

## 2014-07-15 MED ORDER — LIDOCAINE HCL (PF) 1 % IJ SOLN
INTRAMUSCULAR | Status: AC
  Administered 2014-07-15: 2.1 mL
  Filled 2014-07-15: qty 5

## 2014-07-15 MED ORDER — CEFTRIAXONE SODIUM 1 G IJ SOLR
INTRAMUSCULAR | Status: AC
  Administered 2014-07-15: 500 mg via INTRAMUSCULAR
  Filled 2014-07-15: qty 10

## 2014-07-15 NOTE — ED Notes (Signed)
Report to katie, rn 

## 2014-07-15 NOTE — ED Provider Notes (Signed)
[facility] Emergency Department Provider Note  ____________________________________________  Time seen: 7:10 AM  I have reviewed the triage vital signs and the nursing notes.   HISTORY  Chief Complaint Abdominal Pain; Back Pain; and Vaginal Discharge    HPI Kathleen Owen is a 24 y.o. female who is approximately [redacted] weeks pregnant and reports having low back pain and low pelvic pain described as an ache, as well as a brownish red discharge for one day. She's also had about 3 days of whitish discharge. Prior to this. No fever, chills, or other abdominal pain. She is eating and drinking normally. No hemorrhage. No dizziness, chest pain, shortness of breath. No prenatal care yet    No past medical history on file.  There are no active problems to display for this patient.   No past surgical history on file.  Current Outpatient Rx  Name  Route  Sig  Dispense  Refill  . metroNIDAZOLE (METROGEL) 0.75 % vaginal gel   Vaginal   Place 1 Applicatorful vaginally 2 (two) times daily.   70 g   0   . Prenatal Multivit-Min-Fe-FA (PRENATAL VITAMINS) 0.8 MG tablet   Oral   Take 1 tablet by mouth daily.   30 tablet   2     Allergies Amoxicillin  No family history on file.  Social History History  Substance Use Topics  . Smoking status: Not on file  . Smokeless tobacco: Not on file  . Alcohol Use: Not on file    Review of Systems  Constitutional: No fever or chills. No weight changes Eyes:No blurry vision or double vision.  ENT: No sore throat. Cardiovascular: No chest pain. Respiratory: No dyspnea or cough. Gastrointestinal: Pelvic pain, no vomiting or diarrhea.  No BRBPR or melena. Genitourinary: Negative for dysuria, urinary retention, bloody urine, or difficulty urinating. Musculoskeletal: Negative for back pain. No joint swelling or pain. Skin: Negative for rash. Neurological: Negative for headaches, focal weakness or  numbness. Psychiatric:No anxiety or depression.   Endocrine:No hot/cold intolerance, changes in energy, or sleep difficulty.  10-point ROS otherwise negative.  ____________________________________________   PHYSICAL EXAM:  VITAL SIGNS: ED Triage Vitals  Enc Vitals Group     BP 07/15/14 0152 103/78 mmHg     Pulse Rate 07/15/14 0152 89     Resp 07/15/14 0152 20     Temp 07/15/14 0152 98.3 F (36.8 C)     Temp Source 07/15/14 0152 Oral     SpO2 07/15/14 0152 100 %     Weight 07/15/14 0152 160 lb (72.576 kg)     Height 07/15/14 0152 5\' 3"  (1.6 m)     Head Cir --      Peak Flow --      Pain Score 07/15/14 0152 8     Pain Loc --      Pain Edu? --      Excl. in [place]? --      Constitutional: Alert and oriented. Well appearing and in no distress. Eyes: No scleral icterus. No conjunctival pallor. PERRL. EOMI ENT   Head: Normocephalic and atraumatic.   Nose: No congestion/rhinnorhea. No septal hematoma   Mouth/Throat: MMM, no pharyngeal erythema. No peritonsillar mass. No uvula shift.   Neck: No stridor. No SubQ emphysema. No meningismus. Hematological/Lymphatic/Immunilogical: No cervical lymphadenopathy. Cardiovascular: RRR. Normal and symmetric distal pulses are present in all extremities. No murmurs, rubs, or gallops. Respiratory: Normal respiratory effort without tachypnea nor retractions. Breath sounds are clear and equal bilaterally.  No wheezes/rales/rhonchi. Gastrointestinal: Soft, mild tenderness over the suprapubic area. No distention. There is no CVA tenderness.  No rebound, rigidity, or guarding. Genitourinary: External pelvic exam unremarkable. Speculum exam with sterile gloves reveals mild cervicitis and whitish discharge. Bimanual exam reveals no adnexal tenderness. No CMT and closed cervical os. Musculoskeletal: Nontender with normal range of motion in all extremities. No joint effusions.  No lower extremity tenderness.  No edema. Neurologic:   Normal  speech and language.  CN 2-10 normal. Motor grossly intact. No pronator drift.  Normal gait. No gross focal neurologic deficits are appreciated.  Skin:  Skin is warm, dry and intact. No rash noted.  No petechiae, purpura, or bullae. Psychiatric: Mood and affect are normal. Speech and behavior are normal. Patient exhibits appropriate insight and judgment.  ____________________________________________    LABS (pertinent positives/negatives) (all labs ordered are listed, but only abnormal results are displayed) Labs Reviewed  WET PREP, GENITAL - Abnormal; Notable for the following:    Yeast Wet Prep HPF POC FEW (*)    Clue Cells Wet Prep HPF POC FEW (*)    WBC, Wet Prep HPF POC MANY (*)    All other components within normal limits  CBC - Abnormal; Notable for the following:    WBC 19.7 (*)    All other components within normal limits  HCG, QUANTITATIVE, PREGNANCY - Abnormal; Notable for the following:    hCG, Beta Chain, Quant, S 3035 (*)    All other components within normal limits  URINALYSIS COMPLETEWITH MICROSCOPIC ([facility])  - Abnormal; Notable for the following:    Color, Urine YELLOW (*)    APPearance CLOUDY (*)    Protein, ur 30 (*)    Leukocytes, UA TRACE (*)    Bacteria, UA RARE (*)    Squamous Epithelial / LPF 6-30 (*)    All other components within normal limits  CHLAMYDIA/NGC RT PCR [facility])   ABO/RH  ABO/RH   ____________________________________________   EKG    ____________________________________________    RADIOLOGY  Pelvic ultrasound consistent with IUP without fetal heart rate. There is also a subchorionic hemorrhage.  ____________________________________________   PROCEDURES  ____________________________________________   INITIAL IMPRESSION / ASSESSMENT AND PLAN / ED COURSE  Pertinent labs & imaging results that were available during my care of the patient were reviewed by me and considered in my medical decision making (see  chart for details).  Patient counseled on possible early miscarriage and a subchorionic hemorrhage that may be causing some bloody discharge. Due to the cervicitis on exam and her symptoms, we went ahead and treated her with azithromycin and ceftriaxone. Empirically. The wet prep does also indicate that she likely has bacterial vaginosis so we'll prescribe her metronidazole. The patient is resistant to taking a by mouth due to GI upset, so I'll prescribe a vaginal gel. No evidence of ectopic TOA, sepsis, torsion or PID.  ____________________________________________   FINAL CLINICAL IMPRESSION(S) / ED DIAGNOSES  Final diagnoses:  Bacterial vaginosis  Cervicitis  First trimester pregnancy  Threatened miscarriage in early pregnancy      Carrie Mew, MD 07/15/14 [phone]

## 2014-07-15 NOTE — ED Notes (Signed)
MD at bedside. 

## 2014-07-15 NOTE — ED Notes (Signed)
Pt presents to ER alert and in NAD. Pt states she is [redacted] weeks pregnant and is having abd pain, back pain and vaginal discharge that is "brownish red."

## 2014-07-15 NOTE — Discharge Instructions (Signed)
Abdominal Pain During Pregnancy Abdominal pain is common in pregnancy. Most of the time, it does not cause harm. There are many causes of abdominal pain. Some causes are more serious than others. Some of the causes of abdominal pain in pregnancy are easily diagnosed. Occasionally, the diagnosis takes time to understand. Other times, the cause is not determined. Abdominal pain can be a sign that something is very wrong with the pregnancy, or the pain may have nothing to do with the pregnancy at all. For this reason, always tell your health care provider if you have any abdominal discomfort. HOME CARE INSTRUCTIONS  Monitor your abdominal pain for any changes. The following actions may help to alleviate any discomfort you are experiencing:  Do not have sexual intercourse or put anything in your vagina until your symptoms go away completely.  Get plenty of rest until your pain improves.  Drink clear fluids if you feel nauseous. Avoid solid food as long as you are uncomfortable or nauseous.  Only take over-the-counter or prescription medicine as directed by your health care provider.  Keep all follow-up appointments with your health care provider. SEEK IMMEDIATE MEDICAL CARE IF:  You are bleeding, leaking fluid, or passing tissue from the vagina.  You have increasing pain or cramping.  You have persistent vomiting.  You have painful or bloody urination.  You have a fever.  You notice a decrease in your baby's movements.  You have extreme weakness or feel faint.  You have shortness of breath, with or without abdominal pain.  You develop a severe headache with abdominal pain.  You have abnormal vaginal discharge with abdominal pain.  You have persistent diarrhea.  You have abdominal pain that continues even after rest, or gets worse. MAKE SURE YOU:   Understand these instructions.  Will watch your condition.  Will get help right away if you are not doing well or get  worse. Document Released: 02/09/2005 Document Revised: 11/30/2012 Document Reviewed: 09/08/2012 [facility] Patient Information 2015 [place], [place]. This information is not intended to replace advice given to you by your health care provider. Make sure you discuss any questions you have with your health care provider.  Bacterial Vaginosis Bacterial vaginosis is an infection of the vagina. It happens when too many of certain germs (bacteria) grow in the vagina. HOME CARE  Take your medicine as told by your doctor.  Finish your medicine even if you start to feel better.  Do not have sex until you finish your medicine and are better.  Tell your sex partner that you have an infection. They should see their doctor for treatment.  Practice safe sex. Use condoms. Have only one sex partner. GET HELP IF:  You are not getting better after 3 days of treatment.  You have more grey fluid (discharge) coming from your vagina than before.  You have more pain than before.  You have a fever. MAKE SURE YOU:   Understand these instructions.  Will watch your condition.  Will get help right away if you are not doing well or get worse. Document Released: 11/19/2007 Document Revised: 11/30/2012 Document Reviewed: 09/21/2012 [facility] Patient Information 2015 [place], [place]. This information is not intended to replace advice given to you by your health care provider. Make sure you discuss any questions you have with your health care provider.

## 2014-07-18 ENCOUNTER — Encounter: Payer: Self-pay | Admitting: Emergency Medicine

## 2014-07-18 ENCOUNTER — Emergency Department
Admission: EM | Admit: 2014-07-18 | Discharge: 2014-07-18 | Disposition: A | Discharge status: Home / Self Care | Payer: Medicaid Other | Attending: Emergency Medicine | Admitting: Emergency Medicine

## 2014-07-18 DIAGNOSIS — O039 Complete or unspecified spontaneous abortion without complication: Secondary | ICD-10-CM

## 2014-07-18 DIAGNOSIS — F17210 Nicotine dependence, cigarettes, uncomplicated: Secondary | ICD-10-CM | POA: Insufficient documentation

## 2014-07-18 DIAGNOSIS — Z880 Allergy status to penicillin: Secondary | ICD-10-CM | POA: Insufficient documentation

## 2014-07-18 DIAGNOSIS — O99331 Smoking (tobacco) complicating pregnancy, first trimester: Secondary | ICD-10-CM | POA: Insufficient documentation

## 2014-07-18 DIAGNOSIS — Z3A00 Weeks of gestation of pregnancy not specified: Secondary | ICD-10-CM | POA: Insufficient documentation

## 2014-07-18 DIAGNOSIS — Z79899 Other long term (current) drug therapy: Secondary | ICD-10-CM | POA: Insufficient documentation

## 2014-07-18 HISTORY — DX: Complete or unspecified spontaneous abortion without complication: O03.9

## 2014-07-18 LAB — HCG, QUANTITATIVE, PREGNANCY: hCG, Beta Chain, Quant, S: 1139 m[IU]/mL — ABNORMAL HIGH (ref <5)

## 2014-07-18 MED ORDER — IBUPROFEN 800 MG PO TABS
800.0000 mg | ORAL_TABLET | Freq: Once | ORAL | Status: AC
  Administered 2014-07-18: 800 mg via ORAL

## 2014-07-18 MED ORDER — IBUPROFEN 800 MG PO TABS
ORAL_TABLET | ORAL | Status: AC
  Filled 2014-07-18: qty 1

## 2014-07-18 NOTE — ED Notes (Signed)
Pt began spotting on Monday and came to ED for eval.  Was sent home.  Today pt began passing tissue.  Also c/o vaginal bleeding and lower abdominal pain.

## 2014-07-18 NOTE — Discharge Instructions (Signed)
Please seek medical attention for any high fevers, chest pain, shortness of breath, change in behavior, persistent vomiting, bloody stool or any other new or concerning symptoms.  Miscarriage A miscarriage is the sudden loss of an unborn baby (fetus) before the 20th week of pregnancy. Most miscarriages happen in the first 3 months of pregnancy. Sometimes, it happens before a woman even knows she is pregnant. A miscarriage is also called a "spontaneous miscarriage" or "early pregnancy loss." Having a miscarriage can be an emotional experience. Talk with your caregiver about any questions you may have about miscarrying, the grieving process, and your future pregnancy plans. CAUSES   Problems with the fetal chromosomes that make it impossible for the baby to develop normally. Problems with the baby's genes or chromosomes are most often the result of errors that occur, by chance, as the embryo divides and grows. The problems are not inherited from the parents.  Infection of the cervix or uterus.   Hormone problems.   Problems with the cervix, such as having an incompetent cervix. This is when the tissue in the cervix is not strong enough to hold the pregnancy.   Problems with the uterus, such as an abnormally shaped uterus, uterine fibroids, or congenital abnormalities.   Certain medical conditions.   Smoking, drinking alcohol, or taking illegal drugs.   Trauma.  Often, the cause of a miscarriage is unknown.  SYMPTOMS   Vaginal bleeding or spotting, with or without cramps or pain.  Pain or cramping in the abdomen or lower back.  Passing fluid, tissue, or blood clots from the vagina. DIAGNOSIS  Your caregiver will perform a physical exam. You may also have an ultrasound to confirm the miscarriage. Blood or urine tests may also be ordered. TREATMENT   Sometimes, treatment is not necessary if you naturally pass all the fetal tissue that was in the uterus. If some of the fetus or  placenta remains in the body (incomplete miscarriage), tissue left behind may become infected and must be removed. Usually, a dilation and curettage (D and C) procedure is performed. During a D and C procedure, the cervix is widened (dilated) and any remaining fetal or placental tissue is gently removed from the uterus.  Antibiotic medicines are prescribed if there is an infection. Other medicines may be given to reduce the size of the uterus (contract) if there is a lot of bleeding.  If you have Rh negative blood and your baby was Rh positive, you will need a Rh immunoglobulin shot. This shot will protect any future baby from having Rh blood problems in future pregnancies. HOME CARE INSTRUCTIONS   Your caregiver may order bed rest or may allow you to continue light activity. Resume activity as directed by your caregiver.  Have someone help with home and family responsibilities during this time.   Keep track of the number of sanitary pads you use each day and how soaked (saturated) they are. Write down this information.   Do not use tampons. Do not douche or have sexual intercourse until approved by your caregiver.   Only take over-the-counter or prescription medicines for pain or discomfort as directed by your caregiver.   Do not take aspirin. Aspirin can cause bleeding.   Keep all follow-up appointments with your caregiver.   If you or your partner have problems with grieving, talk to your caregiver or seek counseling to help cope with the pregnancy loss. Allow enough time to grieve before trying to get pregnant again.  SEEK IMMEDIATE  MEDICAL CARE IF:   You have severe cramps or pain in your back or abdomen.  You have a fever.  You pass large blood clots (walnut-sized or larger) ortissue from your vagina. Save any tissue for your caregiver to inspect.   Your bleeding increases.   You have a thick, bad-smelling vaginal discharge.  You become lightheaded, weak, or you  faint.   You have chills.  MAKE SURE YOU:  Understand these instructions.  Will watch your condition.  Will get help right away if you are not doing well or get worse. Document Released: 08/05/2000 Document Revised: 06/06/2012 Document Reviewed: 03/31/2011 [facility] Patient Information 2015 [place], [place]. This information is not intended to replace advice given to you by your health care provider. Make sure you discuss any questions you have with your health care provider.

## 2014-07-18 NOTE — ED Notes (Signed)
Pt passed tissue size of quarter.  md informed ,  No testing ordered.

## 2014-07-18 NOTE — ED Provider Notes (Signed)
[facility] Emergency Department Provider Note    ____________________________________________  Time seen: 1750  I have reviewed the triage vital signs and the nursing notes.   HISTORY  Chief Complaint Vaginal Bleeding   History limited by: Not Limited   HPI Kathleen Owen is a 24 y.o. female presents to the emergency department today because of concerns for passing tissue and lower abdominal pain. Patient was seen recently in the emergency department for brownish red discharge. States had an ultrasound done which did show an intrauterine pregnancy. States today and noticed some bright red clots as well as stringy tissue-like material. States the pain is also been severe. States it reminds her of when she has had previous miscarriages. Denies any fevers, chest pain or shortness of breath.    History reviewed. No pertinent past medical history.  There are no active problems to display for this patient.   Past Surgical History  Procedure Laterality Date  . Tonsillectomy      Current Outpatient Rx  Name  Route  Sig  Dispense  Refill  . Prenatal Multivit-Min-Fe-FA (PRENATAL VITAMINS) 0.8 MG tablet   Oral   Take 1 tablet by mouth daily.   30 tablet   2   . metroNIDAZOLE (METROGEL) 0.75 % vaginal gel   Vaginal   Place 1 Applicatorful vaginally 2 (two) times daily.   70 g   0     Allergies Amoxicillin  History reviewed. No pertinent family history.  Social History History  Substance Use Topics  . Smoking status: Current Every Day Smoker  . Smokeless tobacco: Not on file  . Alcohol Use: No    Review of Systems  Constitutional: Negative for fever. Cardiovascular: Negative for chest pain. Respiratory: Negative for shortness of breath. Gastrointestinal: Lower abdominal pain Genitourinary: Negative for dysuria. Musculoskeletal: Negative for back pain. Skin: Negative for rash. Neurological: Negative for headaches, focal weakness or  numbness.   10-point ROS otherwise negative.  ____________________________________________   PHYSICAL EXAM:  VITAL SIGNS: ED Triage Vitals  Enc Vitals Group     BP 07/18/14 1709 99/78 mmHg     Pulse Rate 07/18/14 1709 93     Resp 07/18/14 1709 20     Temp --      Temp Source 07/18/14 1709 Oral     SpO2 07/18/14 1709 99 %     Weight 07/18/14 1709 160 lb (72.576 kg)     Height 07/18/14 1709 5\' 3"  (1.6 m)     Head Cir --      Peak Flow --      Pain Score 07/18/14 1710 9   Constitutional: Alert and oriented. Well appearing and in no distress. Eyes: Conjunctivae are normal. PERRL. Normal extraocular movements. ENT   Head: Normocephalic and atraumatic.   Nose: No congestion/rhinnorhea.   Mouth/Throat: Mucous membranes are moist.   Neck: No stridor. Hematological/Lymphatic/Immunilogical: No cervical lymphadenopathy. Cardiovascular: Normal rate, regular rhythm.  No murmurs, rubs, or gallops. Respiratory: Normal respiratory effort without tachypnea nor retractions. Breath sounds are clear and equal bilaterally. No wheezes/rales/rhonchi. Gastrointestinal: Soft, minimally tender to palpation lower abdominal area. No rebound. No guarding. Genitourinary: Deferred Musculoskeletal: Normal range of motion in all extremities. No joint effusions.  No lower extremity tenderness nor edema. Neurologic:  Normal speech and language. No gross focal neurologic deficits are appreciated. Speech is normal.  Skin:  Skin is warm, dry and intact. No rash noted. Psychiatric: Mood and affect are normal. Speech and behavior are normal. Patient exhibits  appropriate insight and judgment.  ____________________________________________    LABS (pertinent positives/negatives)  Labs Reviewed  HCG, QUANTITATIVE, PREGNANCY - Abnormal; Notable for the following:    hCG, Beta Chain, Quant, S 1139 (*)    All other components within normal limits   Of note previous hCG 3035 days  ago  ____________________________________________   EKG  None  ____________________________________________    RADIOLOGY  None  ____________________________________________   PROCEDURES  Procedure(s) performed: None  Critical Care performed: No  ____________________________________________   INITIAL IMPRESSION / ASSESSMENT AND PLAN / ED COURSE  Pertinent labs & imaging results that were available during my care of the patient were reviewed by me and considered in my medical decision making (see chart for details).  Patient here with pelvic pain and concern for passing vaginal tissue. Bedside ultrasound does not show an intrauterine gestational sac. Additionally beta hCG has decreased from previous eye 3 days ago. I feel like clinical history and findings consistent with miscarriage. Encourage primary care and OB/GYN follow-up.  ____________________________________________   FINAL CLINICAL IMPRESSION(S) / ED DIAGNOSES  Final diagnoses:  Miscarriage     Nance Pear, MD 07/18/14 [id number]

## 2014-07-30 ENCOUNTER — Encounter: Payer: Self-pay | Admitting: Gastroenterology

## 2014-07-30 ENCOUNTER — Ambulatory Visit (INDEPENDENT_AMBULATORY_CARE_PROVIDER_SITE_OTHER): Payer: Self-pay | Admitting: Gastroenterology

## 2014-07-30 VITALS — BP 115/73 | HR 74 | Temp 98.1°F | Resp 20 | Ht 63.0 in | Wt 154.0 lb

## 2014-07-30 DIAGNOSIS — K5090 Crohn's disease, unspecified, without complications: Secondary | ICD-10-CM

## 2014-07-30 DIAGNOSIS — J45909 Unspecified asthma, uncomplicated: Secondary | ICD-10-CM

## 2014-07-30 HISTORY — DX: Crohn's disease, unspecified, without complications: K50.90

## 2014-07-30 HISTORY — DX: Unspecified asthma, uncomplicated: J45.909

## 2014-07-30 MED ORDER — PREDNISONE 10 MG PO TABS: 40.0000 mg | ORAL_TABLET | Freq: Every day | ORAL | Status: AC

## 2014-07-30 NOTE — Assessment & Plan Note (Addendum)
This patient is a 24 year old woman with presumed Crohn's disease. The site of her Crohn disease has not been ascertained despite upper endoscopy colonoscopy and small bowel follow-through. The patient did very well on steroids in the past. The patient has been told that the feasibility of keeping her on steroids long-term have higher risks. The patient will be started back on steroids to get her symptoms under control. The patient is not having diarrhea at the present time and states she is having constipation. The patient has been told to find a primary care provider to ascertain why she keeps having miscarriages. The patient and her mother have been explained the plan and agree with it.

## 2014-07-30 NOTE — Progress Notes (Signed)
Primary Care Physician: Stoney Bang, MD  Primary Gastroenterologist:  Dr. Lucilla Lame  No chief complaint on file.   HPI: Kathleen Owen is a 24 y.o. female here if lower abdominal pain and a history of likely Crohn's disease. The patient has had a small bowel follow-through upper endoscopy and colonoscopy that did not show any active disease but her CRP has been positive and her blood test for inflammatory bowel disease with consistent with Crohn's disease. The patient has not had biopsy proven Crohn's disease but has been better with steroids. The patient recently had been on steroids and felt well until she got pregnant and ran out of medication. 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 The original abdominal pain was diagnosed for some time as PID but the patient had never gotten better which brought her to me and resulted in this diagnosis.  No current outpatient prescriptions on file.   No current facility-administered medications for this visit.    Allergies as of 07/30/2014 - Review Complete 07/30/2014  Allergen Reaction Noted  . Amoxicillin Rash 07-19-14    ROS:  General: Negative for anorexia, weight loss, fever, chills, fatigue, weakness. ENT: Negative for hoarseness, difficulty swallowing , nasal congestion. CV: Negative for chest pain, angina, palpitations, dyspnea on exertion, peripheral edema.  Respiratory: Negative for dyspnea at rest, dyspnea on exertion, cough, sputum, wheezing.  GI: See history of present illness. GU:  Negative for dysuria, hematuria, urinary incontinence, urinary frequency, nocturnal urination.  Endo: Negative for unusual weight change.    Physical Examination:   BP 115/73 mmHg  Pulse 74  Temp(Src) 98.1 F (36.7 C) (Oral)  Resp 20  Ht 5\' 3"  (1.6 m)  Wt 154 lb (69.854 kg)  BMI 27.29 kg/m2  LMP 05/26/2014 (Exact Date)  Breastfeeding? Unknown  General: Well-nourished, well-developed in no acute distress.  Eyes:  No icterus. Conjunctivae pink. Mouth: Oropharyngeal mucosa moist and pink , no lesions erythema or exudate. Lungs: Clear to auscultation bilaterally. Non-labored. Heart: Regular rate and rhythm, no murmurs rubs or gallops.  Abdomen: Bowel sounds are normal, nontender, nondistended, no hepatosplenomegaly or masses, no abdominal bruits or hernia , no rebound or guarding.   Extremities: No lower extremity edema. No clubbing or deformities. Neuro: Alert and oriented x 3.  Grossly intact. Skin: Warm and dry, no jaundice.   Psych: Alert and cooperative, normal mood and affect.    Imaging Studies: US Ob Comp Less 14 Wks  2014-07-19   CLINICAL DATA:  Pregnant patient with right pelvic and back pain for 3 days. Vaginal spotting.  EXAM: OBSTETRIC <14 WK [place] AND TRANSVAGINAL OB US  TECHNIQUE: Both transabdominal and transvaginal ultrasound examinations were performed for complete evaluation of the gestation as well as the maternal uterus, adnexal regions, and pelvic cul-de-sac. Transvaginal technique was performed to assess early pregnancy.  COMPARISON:  None this pregnancy.  Pelvic ultrasound 03/27/2014  FINDINGS: Intrauterine gestational sac: Visualized, slightly irregular in shape.  Yolk sac:  Present.  Embryo:  Not present.  Cardiac Activity: Not present.  MSD: 6  mm   5 w   2  d  Maternal uterus/adnexae: Small amount of subchorionic hemorrhage measuring 1 x 0.9 x 0.5 cm. Fundal fibroid measuring 2.5 x 2.2 x 2.5 cm is noted with internal vascularity. Right ovary measures 3.5 x 1.6 x 2.5 cm with normal blood flow and appears normal. The left ovary is not visualized. No free fluid in the pelvis.  IMPRESSION: Probable early intrauterine gestational  sac and yolk sac. No fetal pole or cardiac activity yet visualized. Small subchorionic hemorrhage. Recommend follow-up quantitative B-HCG levels and follow-up US in 14 days to confirm and assess viability. This recommendation follows SRU consensus guidelines: Diagnostic  Criteria for Nonviable Pregnancy Early in the First Trimester. [employer] Med 2013; 384:5364-68.   Electronically Signed   By: Jeb Levering M.D.   On: 07/15/2014 05:06   US Ob Transvaginal  07/15/2014   CLINICAL DATA:  Pregnant patient with right pelvic and back pain for 3 days. Vaginal spotting.  EXAM: OBSTETRIC <14 WK [place] AND TRANSVAGINAL OB US  TECHNIQUE: Both transabdominal and transvaginal ultrasound examinations were performed for complete evaluation of the gestation as well as the maternal uterus, adnexal regions, and pelvic cul-de-sac. Transvaginal technique was performed to assess early pregnancy.  COMPARISON:  None this pregnancy.  Pelvic ultrasound 03/27/2014  FINDINGS: Intrauterine gestational sac: Visualized, slightly irregular in shape.  Yolk sac:  Present.  Embryo:  Not present.  Cardiac Activity: Not present.  MSD: 6  mm   5 w   2  d  Maternal uterus/adnexae: Small amount of subchorionic hemorrhage measuring 1 x 0.9 x 0.5 cm. Fundal fibroid measuring 2.5 x 2.2 x 2.5 cm is noted with internal vascularity. Right ovary measures 3.5 x 1.6 x 2.5 cm with normal blood flow and appears normal. The left ovary is not visualized. No free fluid in the pelvis.  IMPRESSION: Probable early intrauterine gestational sac and yolk sac. No fetal pole or cardiac activity yet visualized. Small subchorionic hemorrhage. Recommend follow-up quantitative B-HCG levels and follow-up US in 14 days to confirm and assess viability. This recommendation follows SRU consensus guidelines: Diagnostic Criteria for Nonviable Pregnancy Early in the First Trimester. [employer] Med 2013; 032:1224-82.   Electronically Signed   By: Jeb Levering M.D.   On: 07/15/2014 05:06

## 2014-09-24 DIAGNOSIS — B9689 Other specified bacterial agents as the cause of diseases classified elsewhere: Secondary | ICD-10-CM

## 2014-09-24 HISTORY — DX: Other specified bacterial agents as the cause of diseases classified elsewhere: B96.89

## 2014-10-15 ENCOUNTER — Telehealth: Payer: Self-pay | Admitting: Gastroenterology

## 2014-10-15 NOTE — Telephone Encounter (Signed)
Patient called, Now has insurance. Prednisone doesn't seem to be working, patient said Dr.Wohl mentioned he could try something else if and when she got insurance

## 2014-10-16 NOTE — Telephone Encounter (Signed)
Please let me know if you want her to come back in for an OV. Pt has crohn's disease and we were treating her with prednisone. Her last appt with [place] was January.

## 2014-10-16 NOTE — Telephone Encounter (Signed)
Pt scheduled follow up to discuss treatment for crohn's on Thurs, Sept 1st.

## 2014-10-16 NOTE — Telephone Encounter (Signed)
Please have patient come in for an office visit.

## 2014-10-24 ENCOUNTER — Other Ambulatory Visit: Payer: Self-pay

## 2014-10-25 ENCOUNTER — Encounter: Payer: Self-pay | Admitting: Gastroenterology

## 2014-10-25 ENCOUNTER — Ambulatory Visit (INDEPENDENT_AMBULATORY_CARE_PROVIDER_SITE_OTHER): Payer: Medicaid Other | Admitting: Gastroenterology

## 2014-10-25 VITALS — BP 114/65 | HR 96 | Temp 98.6°F | Ht 63.0 in | Wt 158.0 lb

## 2014-10-25 DIAGNOSIS — K5090 Crohn's disease, unspecified, without complications: Secondary | ICD-10-CM | POA: Diagnosis not present

## 2014-10-25 MED ORDER — MESALAMINE ER 500 MG PO CPCR: 1000.0000 mg | ORAL_CAPSULE | Freq: Four times a day (QID) | ORAL | Status: DC

## 2014-10-25 NOTE — Progress Notes (Signed)
Primary Care Physician: Stoney Bang, MD  Primary Gastroenterologist:  Dr. Lucilla Lame  No chief complaint on file.   HPI: Kathleen Owen is a 24 y.o. female here for follow-up of her Crohn's disease. The patient has serology that suggests that she has Crohn's disease but her endoscopy did not show the source. The patient has also had markers of inflammation and will positive. She responds well to steroids but then has symptoms again when she stop the steroids. She now reports that she recently completed her treated with steroids and her symptoms are beginning to return.  Current Outpatient Prescriptions  Medication Sig Dispense Refill  . HYDROcodone-acetaminophen (NORCO/VICODIN) 5-325 MG per tablet TK 1 T PO Q 4 TO 6 H  PRN P  0  . mesalamine (PENTASA) 500 MG CR capsule Take 2 capsules (1,000 mg total) by mouth 4 (four) times daily. 240 capsule 3  . metroNIDAZOLE (FLAGYL) 500 MG tablet TK 1 T PO Q  12 H  0  . predniSONE (DELTASONE) 10 MG tablet Take 4 tablets (40 mg total) by mouth daily. 40 mg/day for a week then 30mg  a day for a week then 20 mg a day for a week and then 10mg  a day (Patient not taking: Reported on 10/25/2014) 70 tablet 2   No current facility-administered medications for this visit.    Allergies as of 10/25/2014 - Review Complete 10/25/2014  Allergen Reaction Noted  . Amoxicillin Rash 07/15/2014    ROS:  General: Negative for anorexia, weight loss, fever, chills, fatigue, weakness. ENT: Negative for hoarseness, difficulty swallowing , nasal congestion. CV: Negative for chest pain, angina, palpitations, dyspnea on exertion, peripheral edema.  Respiratory: Negative for dyspnea at rest, dyspnea on exertion, cough, sputum, wheezing.  GI: See history of present illness. GU:  Negative for dysuria, hematuria, urinary incontinence, urinary frequency, nocturnal urination.  Endo: Negative for unusual weight change.    Physical Examination:   BP 114/65 mmHg   Pulse 96  Temp(Src) 98.6 F (37 C) (Oral)  Ht 5\' 3"  (1.6 m)  Wt 158 lb (71.668 kg)  BMI 28.00 kg/m2  General: Well-nourished, well-developed in no acute distress.  Eyes: No icterus. Conjunctivae pink. Mouth: Oropharyngeal mucosa moist and pink , no lesions erythema or exudate. Lungs: Clear to auscultation bilaterally. Non-labored. Heart: Regular rate and rhythm, no murmurs rubs or gallops.  Abdomen: Bowel sounds are normal, nontender, nondistended, no hepatosplenomegaly or masses, no abdominal bruits or hernia , no rebound or guarding.   Extremities: No lower extremity edema. No clubbing or deformities. Neuro: Alert and oriented x 3.  Grossly intact. Skin: Warm and dry, no jaundice.   Psych: Alert and cooperative, normal mood and affect.  Labs:    Imaging Studies: No results found.  Assessment and Plan:   Kathleen Owen is a 24 y.o. y/o female who has a workup suggests will disease without a location found that small bowel follow-through or upper endoscopy and colonoscopy. The patient has been doing well on steroids but she has been explained that long-term steroid use is not an option for her. The patient will be started on Pentasa to see if her symptoms improve. If her symptoms do not improve she is been told that she will likely be sent for evaluation at a tertiary care center.  The patient and her mother have been explained the plan and agree with it.   Note: This dictation was prepared with Dragon dictation along with smaller phrase technology. Any transcriptional  errors that result from this process are unintentional.

## 2014-10-30 ENCOUNTER — Encounter: Payer: Self-pay | Admitting: *Deleted

## 2014-10-30 DIAGNOSIS — R197 Diarrhea, unspecified: Secondary | ICD-10-CM | POA: Insufficient documentation

## 2014-10-30 DIAGNOSIS — Z79899 Other long term (current) drug therapy: Secondary | ICD-10-CM | POA: Insufficient documentation

## 2014-10-30 DIAGNOSIS — Z880 Allergy status to penicillin: Secondary | ICD-10-CM | POA: Diagnosis not present

## 2014-10-30 DIAGNOSIS — O9989 Other specified diseases and conditions complicating pregnancy, childbirth and the puerperium: Secondary | ICD-10-CM | POA: Insufficient documentation

## 2014-10-30 DIAGNOSIS — Z720 Tobacco use: Secondary | ICD-10-CM | POA: Insufficient documentation

## 2014-10-30 DIAGNOSIS — O210 Mild hyperemesis gravidarum: Secondary | ICD-10-CM | POA: Diagnosis not present

## 2014-10-30 DIAGNOSIS — R109 Unspecified abdominal pain: Secondary | ICD-10-CM | POA: Insufficient documentation

## 2014-10-30 DIAGNOSIS — Z3A01 Less than 8 weeks gestation of pregnancy: Secondary | ICD-10-CM | POA: Insufficient documentation

## 2014-10-30 LAB — CBC WITH DIFFERENTIAL/PLATELET
Basophils Absolute: 0.1 10*3/uL (ref 0–0.1)
Basophils Relative: 0 %
Eosinophils Absolute: 0.4 10*3/uL (ref 0–0.7)
Eosinophils Relative: 2 %
HEMATOCRIT: 41.7 % (ref 35.0–47.0)
HEMOGLOBIN: 14.1 g/dL (ref 12.0–16.0)
LYMPHS ABS: 5.3 10*3/uL — AB (ref 1.0–3.6)
Lymphocytes Relative: 26 %
MCH: 30.2 pg (ref 26.0–34.0)
MCHC: 33.7 g/dL (ref 32.0–36.0)
MCV: 89.7 fL (ref 80.0–100.0)
Monocytes Absolute: 1.3 10*3/uL — ABNORMAL HIGH (ref 0.2–0.9)
Monocytes Relative: 6 %
NEUTROS PCT: 66 %
Neutro Abs: 13.1 10*3/uL — ABNORMAL HIGH (ref 1.4–6.5)
Platelets: 274 10*3/uL (ref 150–440)
RBC: 4.65 MIL/uL (ref 3.80–5.20)
RDW: 13.4 % (ref 11.5–14.5)
WBC: 20.2 10*3/uL — ABNORMAL HIGH (ref 3.6–11.0)

## 2014-10-30 LAB — URINALYSIS COMPLETE WITH MICROSCOPIC (ARMC ONLY)
Bacteria, UA: NONE SEEN
Bilirubin Urine: NEGATIVE
GLUCOSE, UA: NEGATIVE mg/dL
Hgb urine dipstick: NEGATIVE
Ketones, ur: NEGATIVE mg/dL
Leukocytes, UA: NEGATIVE
Nitrite: NEGATIVE
Protein, ur: NEGATIVE mg/dL
Specific Gravity, Urine: 1.014 (ref 1.005–1.030)
pH: 8 (ref 5.0–8.0)

## 2014-10-30 LAB — POCT PREGNANCY, URINE: Preg Test, Ur: NEGATIVE

## 2014-10-30 NOTE — ED Notes (Signed)
Pt has n/v/d for 2 days.  Pt had positive home pregnancy test 2 weeks ago,  No vag bleeding or discharge.  No dysuria.

## 2014-10-31 ENCOUNTER — Ambulatory Visit: Payer: Medicaid Other

## 2014-10-31 ENCOUNTER — Other Ambulatory Visit: Payer: Self-pay | Admitting: Emergency Medicine

## 2014-10-31 ENCOUNTER — Emergency Department
Admission: EM | Admit: 2014-10-31 | Discharge: 2014-10-31 | Disposition: A | Discharge status: Home / Self Care | Payer: Medicaid Other | Attending: Emergency Medicine | Admitting: Emergency Medicine

## 2014-10-31 DIAGNOSIS — R197 Diarrhea, unspecified: Secondary | ICD-10-CM

## 2014-10-31 DIAGNOSIS — Z3201 Encounter for pregnancy test, result positive: Secondary | ICD-10-CM

## 2014-10-31 DIAGNOSIS — R112 Nausea with vomiting, unspecified: Secondary | ICD-10-CM

## 2014-10-31 DIAGNOSIS — Z3490 Encounter for supervision of normal pregnancy, unspecified, unspecified trimester: Secondary | ICD-10-CM

## 2014-10-31 LAB — COMPREHENSIVE METABOLIC PANEL
ALK PHOS: 54 U/L (ref 38–126)
ALT: 20 U/L (ref 14–54)
AST: 23 U/L (ref 15–41)
Albumin: 4 g/dL (ref 3.5–5.0)
Anion gap: 8 (ref 5–15)
BUN: <5 mg/dL — ABNORMAL LOW (ref 6–20)
CALCIUM: 9.6 mg/dL (ref 8.9–10.3)
CO2: 25 mmol/L (ref 22–32)
CREATININE: 0.58 mg/dL (ref 0.44–1.00)
Chloride: 104 mmol/L (ref 101–111)
GFR calc Af Amer: >60 mL/min (ref >60)
GFR calc non Af Amer: >60 mL/min (ref >60)
Glucose, Bld: 109 mg/dL — ABNORMAL HIGH (ref 65–99)
Potassium: 3.9 mmol/L (ref 3.5–5.1)
SODIUM: 137 mmol/L (ref 135–145)
Total Bilirubin: 0.3 mg/dL (ref 0.3–1.2)
Total Protein: 7.5 g/dL (ref 6.5–8.1)

## 2014-10-31 LAB — HCG, QUANTITATIVE, PREGNANCY: hCG, Beta Chain, Quant, S: 33 m[IU]/mL — ABNORMAL HIGH (ref <5)

## 2014-10-31 LAB — LIPASE, BLOOD: Lipase: 23 U/L (ref 22–51)

## 2014-10-31 NOTE — ED Provider Notes (Signed)
[facility] Emergency Department Provider Note  ____________________________________________  Time seen: Approximately 1:45 AM  I have reviewed the triage vital signs and the nursing notes.   HISTORY  Chief Complaint Emesis and Diarrhea    HPI Kathleen Owen is a 24 y.o. female with a history of probable Crohn's disease and multiple miscarriages in the past who presents with 2-3 days of persistent nausea/vomiting and diarrhea.  She states is similar to episodes that she has had in the past.  She is on "some medicine" (thought to be mesalamine based on [facility]) from Dr. Allen Norris for her Crohn's disease but she has not started yet because she had a home pregnancy test 2 weeks ago that was positive.  She has had no vaginal bleeding or discharge.  She also denies dysuria.  She previously took prednisone for the Crohn's disease but she says that she has not taken it for about a month.  She has no complaints of abdominal pain other than intermittent mild cramping.  She does not know what started these episodes.  She states that she has not had any vomiting for about 12 hours.  The diarrhea is the biggest problem and has profuse and watery.  She has not seen any blood in it.  She has not called Dr. Allen Norris to discuss her symptoms.   Past Medical History  Diagnosis Date  . Miscarriage 07/18/2014  . 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 CD (Crohn's disease) 07/30/2014    Patient Active Problem List   Diagnosis Date Noted  . 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 CD (Crohn's disease) 07/30/2014  . Body mass index (BMI) of 27.0-27.9 in adult 10/03/2013    Past Surgical History  Procedure Laterality Date  . Tonsillectomy      Current Outpatient Rx  Name  Route  Sig  Dispense  Refill  . metroNIDAZOLE (FLAGYL) 500 MG tablet      TK 1 T PO Q  12 H      0   . HYDROcodone-acetaminophen (NORCO/VICODIN) 5-325 MG per tablet      TK 1 T PO Q 4 TO 6 H  PRN P      0   .  mesalamine (PENTASA) 500 MG CR capsule   Oral   Take 2 capsules (1,000 mg total) by mouth 4 (four) times daily.   240 capsule   3   . predniSONE (DELTASONE) 10 MG tablet   Oral   Take 4 tablets (40 mg total) by mouth daily. 40 mg/day for a week then 30mg  a day for a week then 20 mg a day for a week and then 10mg  a day Patient not taking: Reported on 10/25/2014   70 tablet   2     Allergies Amoxicillin  Family History  Problem Relation Age of Onset  . Kidney disease Maternal Uncle     Social History Social History  Substance Use Topics  . Smoking status: Current Every Day Smoker -- 0.25 packs/day    Types: Cigarettes  . Smokeless tobacco: Never Used  . Alcohol Use: No    Review of Systems Constitutional: No fever/chills Eyes: No visual changes. ENT: No sore throat. Cardiovascular: Denies chest pain. Respiratory: Denies shortness of breath. Gastrointestinal: Mild intermittent abdominal cramping, currently gone, with frequent vomiting and diarrhea for 2-3 days Genitourinary: Negative for dysuria. Musculoskeletal: Negative for back pain. Skin: Negative for rash. Neurological: Negative for headaches, focal weakness or numbness.  10-point ROS otherwise negative.  ____________________________________________  PHYSICAL EXAM:  VITAL SIGNS: ED Triage Vitals  Enc Vitals Group     BP 10/30/14 2305 116/70 mmHg     Pulse Rate 10/30/14 2305 86     Resp 10/30/14 2305 18     Temp 10/30/14 2305 98.8 F (37.1 C)     Temp Source 10/30/14 2305 Oral     SpO2 10/30/14 2305 100 %     Weight 10/30/14 2305 154 lb (69.854 kg)     Height 10/30/14 2305 5\' 3"  (1.6 m)     Head Cir --      Peak Flow --      Pain Score 10/30/14 2306 5     Pain Loc --      Pain Edu? --      Excl. in [place]? --     Constitutional: Alert and oriented. Well appearing and in no acute distress. Afebrile, VSS. Eyes: Conjunctivae are normal. PERRL. EOMI. Head: Atraumatic. Nose: No  congestion/rhinnorhea. Mouth/Throat: Mucous membranes are moist.  Oropharynx non-erythematous. Neck: No stridor.   Cardiovascular: Normal rate, regular rhythm. Grossly normal heart sounds.  Good peripheral circulation. Respiratory: Normal respiratory effort.  No retractions. Lungs CTAB. Gastrointestinal: Soft and nontender. No distention. No abdominal bruits. No CVA tenderness. Musculoskeletal: No lower extremity tenderness nor edema.  No joint effusions. Neurologic:  Normal speech and language. No gross focal neurologic deficits are appreciated.  Skin:  Skin is warm, dry and intact. No rash noted. Psychiatric: Mood and affect are normal. Speech and behavior are normal.  ____________________________________________   LABS (all labs ordered are listed, but only abnormal results are displayed)  Labs Reviewed  CBC WITH DIFFERENTIAL/PLATELET - Abnormal; Notable for the following:    WBC 20.2 (*)    Neutro Abs 13.1 (*)    Lymphs Abs 5.3 (*)    Monocytes Absolute 1.3 (*)    All other components within normal limits  COMPREHENSIVE METABOLIC PANEL - Abnormal; Notable for the following:    Glucose, Bld 109 (*)    BUN <5 (*)    All other components within normal limits  URINALYSIS COMPLETEWITH MICROSCOPIC ([facility] ONLY) - Abnormal; Notable for the following:    Color, Urine YELLOW (*)    APPearance CLEAR (*)    Squamous Epithelial / LPF 6-30 (*)    All other components within normal limits  HCG, QUANTITATIVE, PREGNANCY - Abnormal; Notable for the following:    hCG, Beta Chain, Quant, S 33 (*)    All other components within normal limits  LIPASE, BLOOD  POC URINE PREG, ED  POCT PREGNANCY, URINE   ____________________________________________  EKG  Not indicated ____________________________________________  RADIOLOGY  No results found. ____________________________________________   PROCEDURES  Procedure(s) performed: None  Critical Care performed:  No ____________________________________________   INITIAL IMPRESSION / ASSESSMENT AND PLAN / ED COURSE  Pertinent labs & imaging results that were available during my care of the patient were reviewed by me and considered in my medical decision making (see chart for details).  The patient is stable without tachycardia and with no complaints at this time.  She has not vomited for about 12 hours.  Her symptoms are similar to her Crohn's flares in the past.  She has a significant leukocytosis but is in no acute distress.  The hCG of 33 is somewhat puzzling.  I offered to obtain a transvaginal and transabdominal ultrasound to look for evidence of pregnancy, but the patient is refusing the transvaginal probe stating that "that is what caused by miscarriage  last time".  There are best to evaluate with a transabdominal ultrasound.  Given the patient's stable appearance, no abdominal tenderness, and history of similar with established patient care with GI, anticipate that we will be able to discharge her home for follow-up in clinic.  ----------------------------------------- 2:20 AM on 10/31/2014 -----------------------------------------  The patient told the nurse that she feels much better and wants to go home.  She stated that she wants her discharge paperwork right now.  She is stable with no apparent emergent medical conditions; I will discharge her with appropriate follow-up recommendations.  I gave her my usual and customary return precautions.  ____________________________________________  FINAL CLINICAL IMPRESSION(S) / ED DIAGNOSES  Final diagnoses:  Diarrhea  Non-intractable vomiting with nausea, vomiting of unspecified type  Positive pregnancy test      NEW MEDICATIONS STARTED DURING THIS VISIT:  Discharge Medication List as of 10/31/2014  2:23 AM       Hinda Kehr, MD 10/31/14 [phone]

## 2014-10-31 NOTE — Discharge Instructions (Signed)
Since you are feeling better, your choosing to leave the emergency department at this time.  We think that that is appropriate, but it is very important that you call Dr. Allen Norris to follow-up at the next available opportunity, preferably in the morning.  It is also important that you follow-up with an OB/GYN doctor given you are positive pregnancy test which she did not want to receive an ultrasound to evaluate.  Please return to the emergency department if he develop new or worsening symptoms that concern you.   Diarrhea Diarrhea is frequent loose and watery bowel movements. It can cause you to feel weak and dehydrated. Dehydration can cause you to become tired and thirsty, have a dry mouth, and have decreased urination that often is dark yellow. Diarrhea is a sign of another problem, most often an infection that will not last long. In most cases, diarrhea typically lasts 2-3 days. However, it can last longer if it is a sign of something more serious. It is important to treat your diarrhea as directed by your caregiver to lessen or prevent future episodes of diarrhea. CAUSES  Some common causes include:  Gastrointestinal infections caused by viruses, bacteria, or parasites.  Food poisoning or food allergies.  Certain medicines, such as antibiotics, chemotherapy, and laxatives.  Artificial sweeteners and fructose.  Digestive disorders. HOME CARE INSTRUCTIONS  Ensure adequate fluid intake (hydration): Have 1 cup (8 oz) of fluid for each diarrhea episode. Avoid fluids that contain simple sugars or sports drinks, fruit juices, whole milk products, and sodas. Your urine should be clear or pale yellow if you are drinking enough fluids. Hydrate with an oral rehydration solution that you can purchase at pharmacies, retail stores, and online. You can prepare an oral rehydration solution at home by mixing the following ingredients together:   - tsp table salt.   tsp baking soda.   tsp salt substitute  containing potassium chloride.  1  tablespoons sugar.  1 L (34 oz) of water.  Certain foods and beverages may increase the speed at which food moves through the gastrointestinal (GI) tract. These foods and beverages should be avoided and include:  Caffeinated and alcoholic beverages.  High-fiber foods, such as raw fruits and vegetables, nuts, seeds, and whole grain breads and cereals.  Foods and beverages sweetened with sugar alcohols, such as xylitol, sorbitol, and mannitol.  Some foods may be well tolerated and may help thicken stool including:  Starchy foods, such as rice, toast, pasta, low-sugar cereal, oatmeal, grits, baked potatoes, crackers, and bagels.  Bananas.  Applesauce.  Add probiotic-rich foods to help increase healthy bacteria in the GI tract, such as yogurt and fermented milk products.  Wash your hands well after each diarrhea episode.  Only take over-the-counter or prescription medicines as directed by your caregiver.  Take a warm bath to relieve any burning or pain from frequent diarrhea episodes. SEEK IMMEDIATE MEDICAL CARE IF:   You are unable to keep fluids down.  You have persistent vomiting.  You have blood in your stool, or your stools are black and tarry.  You do not urinate in 6-8 hours, or there is only a small amount of very dark urine.  You have abdominal pain that increases or localizes.  You have weakness, dizziness, confusion, or light-headedness.  You have a severe headache.  Your diarrhea gets worse or does not get better.  You have a fever or persistent symptoms for more than 2-3 days.  You have a fever and your symptoms suddenly  get worse. MAKE SURE YOU:   Understand these instructions.  Will watch your condition.  Will get help right away if you are not doing well or get worse. Document Released: 01/30/2002 Document Revised: 06/26/2013 Document Reviewed: 10/18/2011 [facility] Patient Information 2015 [place], [place]. This  information is not intended to replace advice given to you by your health care provider. Make sure you discuss any questions you have with your health care provider.  Nausea and Vomiting Nausea is a sick feeling that often comes before throwing up (vomiting). Vomiting is a reflex where stomach contents come out of your mouth. Vomiting can cause severe loss of body fluids (dehydration). Children and elderly adults can become dehydrated quickly, especially if they also have diarrhea. Nausea and vomiting are symptoms of a condition or disease. It is important to find the cause of your symptoms. CAUSES   Direct irritation of the stomach lining. This irritation can result from increased acid production (gastroesophageal reflux disease), infection, food poisoning, taking certain medicines (such as nonsteroidal anti-inflammatory drugs), alcohol use, or tobacco use.  Signals from the brain.These signals could be caused by a headache, heat exposure, an inner ear disturbance, increased pressure in the brain from injury, infection, a tumor, or a concussion, pain, emotional stimulus, or metabolic problems.  An obstruction in the gastrointestinal tract (bowel obstruction).  Illnesses such as diabetes, hepatitis, gallbladder problems, appendicitis, kidney problems, cancer, sepsis, atypical symptoms of a heart attack, or eating disorders.  Medical treatments such as chemotherapy and radiation.  Receiving medicine that makes you sleep (general anesthetic) during surgery. DIAGNOSIS Your caregiver may ask for tests to be done if the problems do not improve after a few days. Tests may also be done if symptoms are severe or if the reason for the nausea and vomiting is not clear. Tests may include:  Urine tests.  Blood tests.  Stool tests.  Cultures (to look for evidence of infection).  X-rays or other imaging studies. Test results can help your caregiver make decisions about treatment or the need for  additional tests. TREATMENT You need to stay well hydrated. Drink frequently but in small amounts.You may wish to drink water, sports drinks, clear broth, or eat frozen ice pops or gelatin dessert to help stay hydrated.When you eat, eating slowly may help prevent nausea.There are also some antinausea medicines that may help prevent nausea. HOME CARE INSTRUCTIONS   Take all medicine as directed by your caregiver.  If you do not have an appetite, do not force yourself to eat. However, you must continue to drink fluids.  If you have an appetite, eat a normal diet unless your caregiver tells you differently.  Eat a variety of complex carbohydrates (rice, wheat, potatoes, bread), lean meats, yogurt, fruits, and vegetables.  Avoid high-fat foods because they are more difficult to digest.  Drink enough water and fluids to keep your urine clear or pale yellow.  If you are dehydrated, ask your caregiver for specific rehydration instructions. Signs of dehydration may include:  Severe thirst.  Dry lips and mouth.  Dizziness.  Dark urine.  Decreasing urine frequency and amount.  Confusion.  Rapid breathing or pulse. SEEK IMMEDIATE MEDICAL CARE IF:   You have blood or brown flecks (like coffee grounds) in your vomit.  You have black or bloody stools.  You have a severe headache or stiff neck.  You are confused.  You have severe abdominal pain.  You have chest pain or trouble breathing.  You do not urinate at least  once every 8 hours.  You develop cold or clammy skin.  You continue to vomit for longer than 24 to 48 hours.  You have a fever. MAKE SURE YOU:   Understand these instructions.  Will watch your condition.  Will get help right away if you are not doing well or get worse. Document Released: 02/09/2005 Document Revised: 05/04/2011 Document Reviewed: 07/09/2010 [facility] Patient Information 2015 [place], [place]. This information is not intended to replace advice  given to you by your health care provider. Make sure you discuss any questions you have with your health care provider.  Prenatal Care  WHAT IS PRENATAL CARE?  Prenatal care means health care during your pregnancy, before your baby is born. It is very important to take care of yourself and your baby during your pregnancy by:   Getting early prenatal care. If you know you are pregnant, or think you might be pregnant, call your health care provider as soon as possible. Schedule a visit for a prenatal exam.  Getting regular prenatal care. Follow your health care provider's schedule for blood and other necessary tests. Do not miss appointments.  Doing everything you can to keep yourself and your baby healthy during your pregnancy.  Getting complete care. Prenatal care should include evaluation of the medical, dietary, educational, psychological, and social needs of you and your significant other. The medical and genetic history of your family and the family of your baby's father should be discussed with your health care provider.  Discussing with your health care provider:  Prescription, over-the-counter, and herbal medicines that you take.  Any history of substance abuse, alcohol use, smoking, and illegal drug use.  Any history of domestic abuse and violence.  Immunizations you have received.  Your nutrition and diet.  The amount of exercise you do.  Any environmental and occupational hazards to which you are exposed.  History of sexually transmitted infections for both you and your partner.  Previous pregnancies you have had. WHY IS PRENATAL CARE SO IMPORTANT?  By regularly seeing your health care provider, you help ensure that problems can be identified early so that they can be treated as soon as possible. Other problems might be prevented. Many studies have shown that early and regular prenatal care is important for the health of mothers and their babies.  HOW CAN I TAKE CARE OF  MYSELF WHILE I AM PREGNANT?  Here are ways to take care of yourself and your baby:   Start or continue taking your multivitamin with 400 micrograms (mcg) of folic acid every day.  Get early and regular prenatal care. It is very important to see a health care provider during your pregnancy. Your health care provider will check at each visit to make sure that you and your baby are healthy. If there are any problems, action can be taken right away to help you and your baby.  Eat a healthy diet that includes:  Fruits.  Vegetables.  Foods low in saturated fat.  Whole grains.  Calcium-rich foods, such as milk, yogurt, and hard cheeses.  Drink 6-8 glasses of liquids a day.  Unless your health care provider tells you not to, try to be physically active for 30 minutes, most days of the week. If you are pressed for time, you can get your activity in through 10-minute segments, three times a day.  Do not smoke, drink alcohol, or use drugs. These can cause long-term damage to your baby. Talk with your health care provider about steps  to take to stop smoking. Talk with a member of your faith community, a counselor, a trusted friend, or your health care provider if you are concerned about your alcohol or drug use.  Ask your health care provider before taking any medicine, even over-the-counter medicines. Some medicines are not safe to take during pregnancy.  Get plenty of rest and sleep.  Avoid hot tubs and saunas during pregnancy.  Do not have X-rays taken unless absolutely necessary and with the recommendation of your health care provider. A lead shield can be placed on your abdomen to protect your baby when X-rays are taken in other parts of your body.  Do not empty the cat litter when you are pregnant. It may contain a parasite that causes an infection called toxoplasmosis, which can cause birth defects. Also, use gloves when working in garden areas used by cats.  Do not eat uncooked or  undercooked meats or fish.  Do not eat soft, mold-ripened cheeses (Brie, Camembert, and chevre) or soft, blue-veined cheese (Danish blue and Roquefort).  Stay away from toxic chemicals like:  Insecticides.  Solvents (some cleaners or paint thinners).  Lead.  Mercury.  Sexual intercourse may continue until the end of the pregnancy, unless you have a medical problem or there is a problem with the pregnancy and your health care provider tells you not to.  Do not wear high-heel shoes, especially during the second half of the pregnancy. You can lose your balance and fall.  Do not take long trips, unless absolutely necessary. Be sure to see your health care provider before going on the trip.  Do not sit in one position for more than 2 hours when on a trip.  Take a copy of your medical records when going on a trip. Know where a hospital is located in the city you are visiting, in case of an emergency.  Most dangerous household products will have pregnancy warnings on their labels. Ask your health care provider about products if you are unsure.  Limit or eliminate your caffeine intake from coffee, tea, sodas, medicines, and chocolate.  Many women continue working through pregnancy. Staying active might help you stay healthier. If you have a question about the safety or the hours you work at your particular job, talk with your health care provider.  Get informed:  Read books.  Watch videos.  Go to childbirth classes for you and your significant other.  Talk with experienced moms.  Ask your health care provider about childbirth education classes for you and your partner. Classes can help you and your partner prepare for the birth of your baby.  Ask about a baby doctor (pediatrician) and methods and pain medicine for labor, delivery, and possible cesarean delivery. HOW OFTEN SHOULD I SEE MY HEALTH CARE PROVIDER DURING PREGNANCY?  Your health care provider will give you a schedule for  your prenatal visits. You will have visits more often as you get closer to the end of your pregnancy. An average pregnancy lasts about 40 weeks.  A typical schedule includes visiting your health care provider:   About once each month during your first 6 months of pregnancy.  Every 2 weeks during the next 2 months.  Weekly in the last month, until the delivery date. Your health care provider will probably want to see you more often if:  You are older than 35 years.  Your pregnancy is high risk because you have certain health problems or problems with the pregnancy, such as:  Diabetes.  High blood pressure.  The baby is not growing on schedule, according to the dates of the pregnancy. Your health care provider will do special tests to make sure you and your baby are not having any serious problems. WHAT HAPPENS DURING PRENATAL VISITS?   At your first prenatal visit, your health care provider will do a physical exam and talk to you about your health history and the health history of your partner and your family. Your health care provider will be able to tell you what date to expect your baby to be born on.  Your first physical exam will include checks of your blood pressure, measurements of your height and weight, and an exam of your pelvic organs. Your health care provider will do a Pap test if you have not had one recently and will do cultures of your cervix to make sure there is no infection.  At each prenatal visit, there will be tests of your blood, urine, blood pressure, weight, and the progress of the baby will be checked.  At your later prenatal visits, your health care provider will check how you are doing and how your baby is developing. You may have a number of tests done as your pregnancy progresses.  Ultrasound exams are often used to check on your baby's growth and health.  You may have more urine and blood tests, as well as special tests, if needed. These may include  amniocentesis to examine fluid in the pregnancy sac, stress tests to check how the baby responds to contractions, or a biophysical profile to measure your baby's well-being. Your health care provider will explain the tests and why they are necessary.  You should be tested for high blood sugar (gestational diabetes) between the 24th and 28th weeks of your pregnancy.  You should discuss with your health care provider your plans to breastfeed or bottle-feed your baby.  Each visit is also a chance for you to learn about staying healthy during pregnancy and to ask questions. Document Released: 02/12/2003 Document Revised: 02/14/2013 Document Reviewed: 04/26/2013 [facility] Patient Information 2015 [place], [place]. This information is not intended to replace advice given to you by your health care provider. Make sure you discuss any questions you have with your health care provider.  Pregnancy Tests HOW DO PREGNANCY TESTS WORK? All pregnancy tests look for a special hormone in the urine or blood that is only present in pregnant women. This hormone, human chorionic gonadotropin (hCG), is also called the pregnancy hormone.  WHAT IS THE DIFFERENCE BETWEEN A URINE AND A BLOOD PREGNANCY TEST? IS ONE BETTER THAN THE OTHER? There are two types of pregnancy tests.  Blood tests.  Urine tests. Both tests look for the presence of hCG, the pregnancy hormone. Many women use a urine test or home pregnancy test (HPT) to find out if they are pregnant. HPTs are cheap, easy to use, can be done at home, and are private. When a woman has a positive result on an HPT, she needs to see her caregiver right away. The caregiver can confirm a positive HPT result with another urine test, a blood test, ultrasound, and a pelvic exam.  There are two types of blood tests you can get from a caregiver.   A quantitative blood test (or the beta hCG test). This test measures the exact amount of hCG in the blood. This means it can pick up  very small amounts of hCG, making it a very accurate test.  A qualitative hCG  blood test. This test gives a simple yes or no answer to whether you are pregnant. This test is more like a urine test in terms of its accuracy. Blood tests can pick up hCG earlier in a pregnancy than urine tests can. Blood tests can tell if you are pregnant about 6 to 8 days after you release an egg from an ovary (ovulate). Urine tests can determine pregnancy about 2 weeks after ovulation.  HOW IS A HOME PREGNANCY TEST DONE?  There are many types of home pregnancy tests or HPTs that can be bought over-the-counter at drug or discount stores.   Some involve collecting your urine in a cup and dipping a stick into the urine or putting some of the urine into a special container with an eyedropper.  Others are done by placing a stick into your urine stream.  Tests vary in how long you need to wait for the stick or container to turn a certain color or have a symbol on it (like a plus or a minus).  All tests come with written instructions. Most tests also have toll-free phone numbers to call if you have any questions about how to do the test or read the results. HOW ACCURATE ARE HOME PREGNANCY TESTS?  HPTs are very accurate. Most brands of HPTs say they are 97% to 99% accurate when taken 1 week after missing your menstrual period, but this can vary with actual use. Each brand varies in how sensitive it is in picking up the pregnancy hormone hCG. If a test is not done correctly, it will be less accurate. Always check the package to make sure it is not past its expiration date. If it is, it will not be accurate. Most brands of HPTs tell users to do the test again in a few days, no matter what the results.  If you use an HPT too early in your pregnancy, you may not have enough of the pregnancy hormone hCG in your urine to have a positive test result. Most HPTs will be accurate if you test yourself around the time your period is due  (about 2 weeks after you ovulate). You can get a negative test result if you are not pregnant or if you ovulated later than you thought you did. You may also have problems with the pregnancy, which affects the amount of hCG you have in your urine. If your HPT is negative, test yourself again within a few days to 1 week. If you keep getting a negative result and think you are pregnant, talk with your caregiver right away about getting a blood pregnancy test.  FALSE POSITIVE PREGNANCY TEST A false positive HPT can happen if there is blood or protein present in your urine. A false positive can also happen if you were recently pregnant or if you take a pregnancy test too soon after taking fertility drug that contains hCG. Also, some prescription medicines such as water pills (diuretics), tranquilizers, seizure medicines, psychiatric medicines, and allergy and nausea medicines (promethazine) give false positive readings. FALSE NEGATIVE PREGNANCY TEST  A false negative HPT can happen if you do the test too early. Try to wait until you are at least 1 day late for your menstrual period.  It may happen if you wait too long to test the urine (longer than 15 minutes).  It may also happen if the urine is too diluted because you drank a lot of fluids before getting the urine sample. It is best to test the  first morning urine after you get out of bed. If your menstrual period did not start after a week of a negative HPT, repeat the pregnancy test. CAN ANYTHING INTERFERE WITH HOME PREGNANCY TEST RESULTS?  Most medicines, both over-the-counter and prescription drugs, including birth control pills and antibiotics, should not affect the results of a HPT. Only those drugs that have the pregnancy hormone hCG in them can give a false positive test result. Drugs that have hCG in them may be used for treating infertility (not being able to get pregnant). Alcohol and illegal drugs do not affect HPT results, but you should not  be using these substances if you are trying to get pregnant. If you have a positive pregnancy test, call your caregiver to make an appointment to begin prenatal care. Document Released: 02/12/2003 Document Revised: 05/04/2011 Document Reviewed: 05/26/2013 [facility] Patient Information 2015 [place], [place]. This information is not intended to replace advice given to you by your health care provider. Make sure you discuss any questions you have with your health care provider.

## 2014-10-31 NOTE — ED Notes (Signed)
During rounding, this RN informed the pt of her plan of care, which included an upcoming [place]. Pt stated she would like her d/c papers and was ready to go home because she "was exhausted". Pt informed that this RN would notify Dr Karma Greaser. MD made aware.

## 2014-11-21 ENCOUNTER — Telehealth: Payer: Self-pay | Admitting: Gastroenterology

## 2014-11-21 NOTE — Telephone Encounter (Signed)
Patient said she  Was calling because her insurance wouldn't approve it? Not sure of medication

## 2014-11-22 ENCOUNTER — Ambulatory Visit
Admission: RE | Admit: 2014-11-22 | Discharge: 2014-11-22 | Disposition: A | Discharge status: Home / Self Care | Payer: Medicaid Other | Source: Ambulatory Visit | Attending: Obstetrics and Gynecology | Admitting: Obstetrics and Gynecology

## 2014-11-22 DIAGNOSIS — K50911 Crohn's disease, unspecified, with rectal bleeding: Secondary | ICD-10-CM

## 2014-11-22 DIAGNOSIS — Z720 Tobacco use: Secondary | ICD-10-CM | POA: Diagnosis not present

## 2014-11-22 DIAGNOSIS — N760 Acute vaginitis: Secondary | ICD-10-CM | POA: Diagnosis not present

## 2014-11-22 DIAGNOSIS — N96 Recurrent pregnancy loss: Secondary | ICD-10-CM

## 2014-11-22 DIAGNOSIS — J4520 Mild intermittent asthma, uncomplicated: Secondary | ICD-10-CM

## 2014-11-22 DIAGNOSIS — F17200 Nicotine dependence, unspecified, uncomplicated: Secondary | ICD-10-CM | POA: Insufficient documentation

## 2014-11-22 HISTORY — DX: Crohn's disease, unspecified, without complications: K50.90

## 2014-11-22 HISTORY — DX: Other specified bacterial agents as the cause of diseases classified elsewhere: B96.89

## 2014-11-22 HISTORY — DX: Acute vaginitis: N76.0

## 2014-11-22 NOTE — Progress Notes (Signed)
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

## 2014-11-22 NOTE — Progress Notes (Signed)
[place] Consultation   Chief Complaint: recurrent pregnancy loss x4   HPI: [name] is a 24 y.o. G6P0060 AAF who presents in consultation from [facility]   for recurrent pregnancy loss.  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  Past Surgical History: She  has past surgical history that includes Tonsillectomy.  Obstetric History:  OB History    Gravida Para Term Preterm AB TAB SAB Ectopic Multiple Living   _0 Gynecologic History:  Patient's last menstrual period was 09/30/2014 (approximate).  Medications: prednisone, pentasa ( she hasn't started yet , flagyl for recurrent BV  Allergies: Patient is allergic to amoxicillin.  Social History: Patient  reports that she has been smoking Cigarettes.  She has been smoking about 0.25 packs per day. She has never used smokeless tobacco. She reports that she drinks about 1.8 oz of alcohol per week. She reports that she does not use illicit drugs.  Family History: family history includes Kidney disease in her maternal uncle.  Review of Systems A full 12 point review of systems was negative or as noted in the History of Present Illness.  Physical Exam: There were no vitals filed for this visit. General appearance - alert, well appearing, and in no distress  Asessement: 1. CD (Crohn's disease), with rectal bleeding   2. 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 Smoker     Plan: I offered parental karyotypes to patient to r/o translocation as a cause of recurrent SAB - pt met with the genetic counselor and will consider - pt has undergone APAS labs which were negative , TSH was normal and SIS was normal .  I reviewed controversy over low progesterone /luteal phase defect as a cause and told her that  mainstream thought is that low prog  is a result of sab and not a cause. If karyotypes are normal -  I would be willing to Rx crinone or prometrium if she felt strongly about using despite lack of evidence. I told her we sometimes use empiric baby aspirin but given her Crohns it might aggravate her condition and there isn't data to support use.  We discussed empiric clomid use but I pointed risk of multiples . I also discussed IVF with endo beinng able to select best embryos for replacement but expense may be prohibitive.   I told her that Crohns can be associated with increased risk of preterm delivery and FGR but not usually SAB . I recommended she get her disease under control with pentasa and prednisone . There is some data of increased dVT risk in pregnancy - appropriate use of SCDs for imobilization  may be helpful .   I recommended she cut back on/ quit  smoking .  I suggested nicotine replacement as an improvement over cigarettes.    Total time spent with the patient was 30 minutes with greater than 50% spent in counseling and coordination of care. We appreciate this interesting consult and will be happy to be involved in the ongoing care of Kathleen Owen in anyway her obstetricians desire.  Alver Sorrow MD [facility]

## 2014-11-22 NOTE — Progress Notes (Signed)
Referring Physician:  Mosetta Pigeon Time of Consultation: 40 minutes  Ms.  Dilmore was referred for an MFM consultation and genetic counseling regarding her history of recurrent pregnancy losses and a brother with developmental delays.  This letter is a summary of our visit and may be helpful as questions arise.  We first obtained a detailed family history and pregnancy history.  Ms. Reyburn is not currently pregnant.  She has had 6 pregnancies, two early elective terminations and 4 first trimester spontaneous losses.  All have been with the same partner.  In the family history, Ms. Bronkema reports one maternal half brother (who is also a maternal first cousin to the father of the baby) with learning delays particularly in reading.  He is now 25 years old and is able to work and live independently.  The father of the baby has a full brother with similar developmental differences.  Neither individual has any birth defects or health concerns.  Learning differences may occur for many reasons, including genetic syndromes, environmental factors and often for no clearly identified reason.  Most genetic syndromes would be expected to result in more significant developmental differences than described in this family or would be accompanied by other health concerns or birth defects.  In the absence of a genetic syndrome, it is difficult to clearly determine the cause for learning differences or the chance for other family members to have a similar delay.  It would be important to be aware of this history and seek educational help if needed. There are no other family members with birth defects, developmental differences, chromosome conditions, recurrent pregnancy loss or known genetic conditions.  There can be many different causes for pregnancy loss.  It is known that approximately 15% of recognized pregnancies will end in miscarriage, often with no known cause.  When a person has experienced more than three losses, we  typically begin to search for specific factors causing the miscarriages. If a couple has experienced less than three losses, it is less likely that there will be a specific identifiable cause.  During our visit, we discussed several of these causes, including chromosome rearrangements, clotting factors, antibodies, and structural differences in the uterus.  Testing was previously ordere to assess for endocrine differences, clotting disorders and structural uterine anomalies which can all contribute to recurrent miscarriages.  That testing was all normal.  We therefore discussed chromosome differences.  Chromosomes are the structures inside each cell of our bodies that have all the instructions for growth and development.  There are usually 23 pairs of chromosomes in each cell, with one member of each pair coming from each parent.  This means that we all have two copies of all of our genetic instructions.  Sometimes the instructions are all present, but are arranged differently.  A rearrangement of the chromosomes is called a translocation and is caused when one or more chromosomes break and reattach in a different location.  As long as a person still has two copies of all the genetic instructions, this is does not result in medical problems.  However, when a person with a translocation makes egg or sperm cells, it is difficult for the chromosomes to divide normally.  This can result in too much or too little genetic information in the fetus, which can cause miscarriage, birth defects, or developmental differences.  We can test the chromosomes of an individual through a blood test.  At the time of this visit, Ms. Hush was offered chromosome analysis on herself  and her husband.  She declined all testing at this time, and desired to speak with her husband before making a decision.  She was given our contact infomration to call to arrange testing if desired.  If she were to become pregnant and were to experience  another loss, chromosome analysis or microarray may be helpful on tissue at the time of loss.  We are happy to speak with this family further to arrange for testing or answer questions.  We may be reached at [phone].  Wilburt Finlay, MS, CGC

## 2014-12-04 NOTE — Telephone Encounter (Signed)
LVM for pt to return my call.

## 2015-01-02 ENCOUNTER — Telehealth: Payer: Self-pay | Admitting: Obstetrics and Gynecology

## 2015-01-02 NOTE — Telephone Encounter (Signed)
Pinkish discharge/ lot of pressure in her pelvic area / lower stomach   ([redacted] wks pregnant)

## 2015-01-03 NOTE — Telephone Encounter (Signed)
This is wrong pt message

## 2015-02-28 ENCOUNTER — Encounter: Payer: Self-pay | Admitting: Urgent Care

## 2015-02-28 ENCOUNTER — Emergency Department
Admission: EM | Admit: 2015-02-28 | Discharge: 2015-02-28 | Disposition: A | Discharge status: Home / Self Care | Payer: Medicaid Other | Attending: Emergency Medicine | Admitting: Emergency Medicine

## 2015-02-28 DIAGNOSIS — Z792 Long term (current) use of antibiotics: Secondary | ICD-10-CM | POA: Diagnosis not present

## 2015-02-28 DIAGNOSIS — F17210 Nicotine dependence, cigarettes, uncomplicated: Secondary | ICD-10-CM | POA: Diagnosis not present

## 2015-02-28 DIAGNOSIS — Z880 Allergy status to penicillin: Secondary | ICD-10-CM | POA: Diagnosis not present

## 2015-02-28 DIAGNOSIS — Z79899 Other long term (current) drug therapy: Secondary | ICD-10-CM | POA: Insufficient documentation

## 2015-02-28 DIAGNOSIS — G43809 Other migraine, not intractable, without status migrainosus: Secondary | ICD-10-CM | POA: Diagnosis not present

## 2015-02-28 DIAGNOSIS — R51 Headache: Secondary | ICD-10-CM | POA: Diagnosis present

## 2015-02-28 MED ORDER — DIPHENHYDRAMINE HCL 50 MG/ML IJ SOLN
50.0000 mg | Freq: Once | INTRAMUSCULAR | Status: AC
  Administered 2015-02-28: 50 mg via INTRAVENOUS
  Filled 2015-02-28: qty 1

## 2015-02-28 MED ORDER — METOCLOPRAMIDE HCL 5 MG/ML IJ SOLN
10.0000 mg | Freq: Once | INTRAMUSCULAR | Status: AC
  Administered 2015-02-28: 10 mg via INTRAVENOUS
  Filled 2015-02-28 (×2): qty 2

## 2015-02-28 MED ORDER — SODIUM CHLORIDE 0.9 % IV BOLUS (SEPSIS)
1000.0000 mL | Freq: Once | INTRAVENOUS | Status: AC
  Administered 2015-02-28: 1000 mL via INTRAVENOUS

## 2015-02-28 MED ORDER — MORPHINE SULFATE (PF) 4 MG/ML IV SOLN
4.0000 mg | Freq: Once | INTRAVENOUS | Status: AC
  Administered 2015-02-28: 4 mg via INTRAVENOUS
  Filled 2015-02-28: qty 1

## 2015-02-28 MED ORDER — BUTALBITAL-APAP-CAFFEINE 50-325-40 MG PO TABS: 1.0000 | ORAL_TABLET | Freq: Four times a day (QID) | ORAL | Status: AC | PRN

## 2015-02-28 NOTE — ED Notes (Deleted)
Not workers comp.

## 2015-02-28 NOTE — ED Notes (Signed)
Pt c/o nausea, headache, and light sensitivity X 2 days.  Pt reports 8 out of 10 pain described as throbbing/pressure behind the left eye. Pt denies vomiting. Pt reports a previous history of migraines, but reports she has not had one in over 2 years.
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
## 2015-02-28 NOTE — ED Notes (Signed)
Reviewed d/c instructions, prescriptions, pain management strategies/idenitification of possible migraine triggers with pt. Pt verbalized understanding.

## 2015-02-28 NOTE — ED Provider Notes (Signed)
[facility] Emergency Department Provider Note  Time seen: 9:11 PM  I have reviewed the triage vital signs and the nursing notes.   HISTORY  Chief Complaint Headache    HPI Kathleen Owen is a 25 y.o. female with a past medical history of Crohn's disease who presents to the emergency department with a headache. According to the patient she has had migraines in the past but it is been several years since she had one. She states 3 days ago she started with a very slight headache which has progressively worsened over the past 3 days. Today she states the headache is making her nauseated, she cannot eat or look at light due to the pain. Denies fever, denies neck pain. Denies acute onset. Has taken Tylenol at home without relief, states due to her Crohn's disease Tylenol as she only medications she can take over-the-counter (has to avoid NSAIDs).Describes the headache as severe, dull aching pain in the front of her head.     Past Medical History  Diagnosis Date  . Miscarriage 07/18/2014  . 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 CD (Crohn's disease) ([place]) 07/30/2014  . Crohn's disease [facility]) Jan 2016  . Bacterial vaginosis August 2016    recurrent BV    Patient Active Problem List   Diagnosis Date Noted  . 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 Smoker 11/22/2014  . Recurrent vaginitis 11/22/2014  . 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 CD (Crohn's disease) ([place]) 07/30/2014  . Body mass index (BMI) of 27.0-27.9 in adult 10/03/2013    Past Surgical History  Procedure Laterality Date  . Tonsillectomy      Current Outpatient Rx  Name  Route  Sig  Dispense  Refill  . HYDROcodone-acetaminophen (NORCO/VICODIN) 5-325 MG per tablet      TK 1 T PO Q 4 TO 6 H  PRN P      0   . mesalamine (PENTASA) 500 MG CR capsule   Oral   Take 2 capsules (1,000 mg total) by mouth 4 (four) times daily. Patient not taking: Reported on 11/22/2014   240  capsule   3   . metroNIDAZOLE (FLAGYL) 500 MG tablet      TK 1 T PO Q  12 H      0   . predniSONE (DELTASONE) 10 MG tablet   Oral   Take 4 tablets (40 mg total) by mouth daily. 40 mg/day for a week then 30mg  a day for a week then 20 mg a day for a week and then 10mg  a day Patient not taking: Reported on 10/25/2014   70 tablet   2     Allergies Aspirin; Ibuprofen; and Amoxicillin  Family History  Problem Relation Age of Onset  . Kidney disease Maternal Uncle     Social History Social History  Substance Use Topics  . Smoking status: Current Every Day Smoker -- 0.25 packs/day    Types: Cigarettes  . Smokeless tobacco: Never Used  . Alcohol Use: 1.8 oz/week    3 Cans of beer per week     Comment: every other weekend    Review of Systems Constitutional: Negative for fever. Cardiovascular: Negative for chest pain. Respiratory: Negative for shortness of breath. Gastrointestinal: Negative for abdominal pain Neurological: Positive for headache. Denies focal weakness or numbness. 10-point ROS otherwise negative.  ____________________________________________   PHYSICAL EXAM:  VITAL SIGNS: ED Triage Vitals  Enc Vitals Group     BP 02/28/15 1910 118/65  mmHg     Pulse Rate 02/28/15 1910 81     Resp 02/28/15 1910 16     Temp 02/28/15 1910 98 F (36.7 C)     Temp Source 02/28/15 1910 Oral     SpO2 02/28/15 1910 97 %     Weight 02/28/15 1910 160 lb (72.576 kg)     Height 02/28/15 1910 5\' 3"  (1.6 m)     Head Cir --      Peak Flow --      Pain Score 02/28/15 1913 9     Pain Loc --      Pain Edu? --      Excl. in [place]? --     Constitutional: Alert and oriented. Well appearing and in no distress. Eyes: Normal exam, moderate photophobia on exam bilaterally. ENT   Head: Normocephalic and atraumatic.   Mouth/Throat: Mucous membranes are moist. Cardiovascular: Normal rate, regular rhythm. No murmur Respiratory: Normal respiratory effort without tachypnea nor  retractions. Breath sounds are clear and equal bilaterally. No wheezes/rales/rhonchi. Gastrointestinal: Soft and nontender. No distention.  Musculoskeletal: Nontender with normal range of motion in all extremities. Neurologic:  Normal speech and language. No gross focal neurologic deficits. 5/5 motor in all extremities. No pronator drift.  Skin:  Skin is warm, dry and intact.  Psychiatric: Mood and affect are normal. Speech and behavior are normal.  ____________________________________________    INITIAL IMPRESSION / ASSESSMENT AND PLAN / ED COURSE  Pertinent labs & imaging results that were available during my care of the patient were reviewed by me and considered in my medical decision making (see chart for details).  Patient presents to the emergency department with what appears to be consistent with a migraine headache. Patient has a history of migraine headaches in the past however it is been over a year since her past headache. States progressive onset, denies fever or neck pain. Overall the patient appears well but does appear uncomfortable especially when looking at light. We will treat the patient's headache and closely monitor in the emergency department. Patient has a very reassuring neurologic exam currently.  Headache is much improved. We'll discharge patient home at this time.  ____________________________________________   FINAL CLINICAL IMPRESSION(S) / ED DIAGNOSES  Migraine headache   Harvest Dark, MD 02/28/15 2249

## 2015-02-28 NOTE — Discharge Instructions (Signed)

## 2015-02-28 NOTE — ED Notes (Signed)
Patient presents with LEFT frontal headache x 3 days. (+) nausea. Taking APAP at home - "Its all I can take because of my crohns"

## 2015-04-13 ENCOUNTER — Encounter: Payer: Self-pay | Admitting: Emergency Medicine

## 2015-04-13 DIAGNOSIS — M545 Low back pain: Secondary | ICD-10-CM | POA: Insufficient documentation

## 2015-04-13 DIAGNOSIS — Z3A00 Weeks of gestation of pregnancy not specified: Secondary | ICD-10-CM

## 2015-04-13 DIAGNOSIS — O99330 Smoking (tobacco) complicating pregnancy, unspecified trimester: Secondary | ICD-10-CM | POA: Insufficient documentation

## 2015-04-13 DIAGNOSIS — Z3A01 Less than 8 weeks gestation of pregnancy: Secondary | ICD-10-CM | POA: Diagnosis not present

## 2015-04-13 DIAGNOSIS — R102 Pelvic and perineal pain: Secondary | ICD-10-CM | POA: Insufficient documentation

## 2015-04-13 DIAGNOSIS — M549 Dorsalgia, unspecified: Secondary | ICD-10-CM | POA: Diagnosis not present

## 2015-04-13 DIAGNOSIS — F17210 Nicotine dependence, cigarettes, uncomplicated: Secondary | ICD-10-CM | POA: Insufficient documentation

## 2015-04-13 DIAGNOSIS — Z880 Allergy status to penicillin: Secondary | ICD-10-CM | POA: Diagnosis not present

## 2015-04-13 DIAGNOSIS — O9989 Other specified diseases and conditions complicating pregnancy, childbirth and the puerperium: Secondary | ICD-10-CM

## 2015-04-13 DIAGNOSIS — N898 Other specified noninflammatory disorders of vagina: Secondary | ICD-10-CM

## 2015-04-13 DIAGNOSIS — R1013 Epigastric pain: Secondary | ICD-10-CM | POA: Diagnosis not present

## 2015-04-13 DIAGNOSIS — Z79899 Other long term (current) drug therapy: Secondary | ICD-10-CM | POA: Diagnosis not present

## 2015-04-13 DIAGNOSIS — O99331 Smoking (tobacco) complicating pregnancy, first trimester: Secondary | ICD-10-CM | POA: Diagnosis not present

## 2015-04-13 DIAGNOSIS — O23599 Infection of other part of genital tract in pregnancy, unspecified trimester: Secondary | ICD-10-CM | POA: Insufficient documentation

## 2015-04-13 DIAGNOSIS — O210 Mild hyperemesis gravidarum: Secondary | ICD-10-CM | POA: Diagnosis not present

## 2015-04-13 LAB — URINALYSIS COMPLETE WITH MICROSCOPIC (ARMC ONLY)
BILIRUBIN URINE: NEGATIVE
GLUCOSE, UA: NEGATIVE mg/dL
Hgb urine dipstick: NEGATIVE
Nitrite: NEGATIVE
Protein, ur: NEGATIVE mg/dL
Specific Gravity, Urine: 1.026 (ref 1.005–1.030)
pH: 6 (ref 5.0–8.0)

## 2015-04-13 MED ORDER — ACETAMINOPHEN 325 MG PO TABS: 650.0000 mg | ORAL_TABLET | Freq: Once | ORAL | Status: DC

## 2015-04-13 NOTE — ED Notes (Addendum)
Pt c/o low right back pain x 1 week; now with intermittent sharp pain in vagina; having white vaginal discharge; pt in no acute distress; pt is pregnant, due 12/01/15; denies vaginal bleeding

## 2015-04-14 ENCOUNTER — Encounter: Payer: Self-pay | Admitting: Emergency Medicine

## 2015-04-14 ENCOUNTER — Emergency Department: Payer: Medicaid Other

## 2015-04-14 ENCOUNTER — Emergency Department
Admission: EM | Admit: 2015-04-14 | Discharge: 2015-04-14 | Disposition: A | Discharge status: Home / Self Care | Payer: Medicaid Other | Source: Home / Self Care

## 2015-04-14 ENCOUNTER — Emergency Department
Admission: EM | Admit: 2015-04-14 | Discharge: 2015-04-15 | Disposition: A | Discharge status: Home / Self Care | Payer: Medicaid Other | Attending: Emergency Medicine | Admitting: Emergency Medicine

## 2015-04-14 DIAGNOSIS — R109 Unspecified abdominal pain: Secondary | ICD-10-CM

## 2015-04-14 DIAGNOSIS — O210 Mild hyperemesis gravidarum: Secondary | ICD-10-CM | POA: Insufficient documentation

## 2015-04-14 DIAGNOSIS — Z79899 Other long term (current) drug therapy: Secondary | ICD-10-CM | POA: Insufficient documentation

## 2015-04-14 DIAGNOSIS — M549 Dorsalgia, unspecified: Secondary | ICD-10-CM | POA: Insufficient documentation

## 2015-04-14 DIAGNOSIS — Z3A01 Less than 8 weeks gestation of pregnancy: Secondary | ICD-10-CM | POA: Insufficient documentation

## 2015-04-14 DIAGNOSIS — F17210 Nicotine dependence, cigarettes, uncomplicated: Secondary | ICD-10-CM | POA: Insufficient documentation

## 2015-04-14 DIAGNOSIS — O99331 Smoking (tobacco) complicating pregnancy, first trimester: Secondary | ICD-10-CM | POA: Insufficient documentation

## 2015-04-14 DIAGNOSIS — O9989 Other specified diseases and conditions complicating pregnancy, childbirth and the puerperium: Secondary | ICD-10-CM | POA: Insufficient documentation

## 2015-04-14 DIAGNOSIS — R1013 Epigastric pain: Secondary | ICD-10-CM

## 2015-04-14 DIAGNOSIS — Z880 Allergy status to penicillin: Secondary | ICD-10-CM | POA: Insufficient documentation

## 2015-04-14 LAB — CBC
HCT: 39.5 % (ref 35.0–47.0)
Hemoglobin: 13.2 g/dL (ref 12.0–16.0)
MCH: 29.7 pg (ref 26.0–34.0)
MCHC: 33.5 g/dL (ref 32.0–36.0)
MCV: 88.5 fL (ref 80.0–100.0)
PLATELETS: 269 10*3/uL (ref 150–440)
RBC: 4.46 MIL/uL (ref 3.80–5.20)
RDW: 14 % (ref 11.5–14.5)
WBC: 20.6 10*3/uL — AB (ref 3.6–11.0)

## 2015-04-14 LAB — HCG, QUANTITATIVE, PREGNANCY: HCG, BETA CHAIN, QUANT, S: 1780 m[IU]/mL — AB (ref <5)

## 2015-04-14 LAB — ABO/RH: ABO/RH(D): A POS

## 2015-04-14 LAB — POCT PREGNANCY, URINE: Preg Test, Ur: POSITIVE — AB

## 2015-04-14 MED ORDER — METOCLOPRAMIDE HCL 5 MG/ML IJ SOLN
10.0000 mg | Freq: Once | INTRAMUSCULAR | Status: AC
  Administered 2015-04-15: 10 mg via INTRAVENOUS
  Filled 2015-04-14: qty 2

## 2015-04-14 MED ORDER — MORPHINE SULFATE (PF) 4 MG/ML IV SOLN
4.0000 mg | Freq: Once | INTRAVENOUS | Status: AC
  Administered 2015-04-15: 4 mg via INTRAVENOUS
  Filled 2015-04-14: qty 1

## 2015-04-14 MED ORDER — SODIUM CHLORIDE 0.9 % IV BOLUS (SEPSIS)
1000.0000 mL | Freq: Once | INTRAVENOUS | Status: AC
  Administered 2015-04-15: 1000 mL via INTRAVENOUS

## 2015-04-14 NOTE — ED Notes (Signed)
Pt here with mother, with c/o upper abdominal 8/10 and back pain 5/10, N/V/D.  Pt reports [redacted] weeks pregnant and reports 7 spontaneous abortions in the past with no live births, and 7 weeks is the longest pregnancy to date.  Pt was here last night for similar s/sx, today different is d/t the vomiting and back pain.

## 2015-04-14 NOTE — ED Provider Notes (Signed)
[facility] Emergency Department Provider Note  ____________________________________________  Time seen: Approximately 11:13 PM  I have reviewed the triage vital signs and the nursing notes.   HISTORY  Chief Complaint Abdominal Pain and Emesis During Pregnancy    HPI Kathleen Owen is a 25 y.o. female who comes into the hospital today with epigastric pain and back pain. The patient reports that yesterday she had some bad back pain on the right. She came here to the hospital but there was a long wait and she was told that she should try to take Tylenol. The patient went home and took Tylenol and was able to go to sleep. She reports that this morning she had a bowel movement and then started having some burning in her upper abdomen. She thought she was maybe just hungry and tried to eat a french fry which made the pain worse. The patient reports that the pain as going from her mid upper abdomen around to her back. The patient reports that she has been unable to eat today. She's vomited 3 times. She does have a history of Crohn's but she's had no diarrhea nor bloody stools. The patient take steroids and mesalamine for her Crohn's but has not taken anything for the past 3-6 months. The patient reports that her pain in her upper abdomen is 8 out of 10 in intensity and she has no lower abdominal pain bleeding or vaginal discharge. The patient is [redacted] weeks pregnant and has had multiple miscarriages in the past. The patient is here because she is unable to tolerate the upper abdominal pain.   Past Medical History  Diagnosis Date  . Miscarriage 07/18/2014  . 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 CD (Crohn's disease) ([place]) 07/30/2014  . Crohn's disease [facility]) Jan 2016  . Bacterial vaginosis August 2016    recurrent BV    Patient Active Problem List   Diagnosis Date Noted  . 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 Smoker 11/22/2014  . Recurrent  vaginitis 11/22/2014  . 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 CD (Crohn's disease) ([place]) 07/30/2014  . Body mass index (BMI) of 27.0-27.9 in adult 10/03/2013    Past Surgical History  Procedure Laterality Date  . Tonsillectomy      Current Outpatient Rx  Name  Route  Sig  Dispense  Refill  . butalbital-acetaminophen-caffeine (FIORICET) 50-325-40 MG tablet   Oral   Take 1-2 tablets by mouth every 6 (six) hours as needed for headache.   20 tablet   0   . HYDROcodone-acetaminophen (NORCO/VICODIN) 5-325 MG per tablet      TK 1 T PO Q 4 TO 6 H  PRN P      0   . mesalamine (PENTASA) 500 MG CR capsule   Oral   Take 2 capsules (1,000 mg total) by mouth 4 (four) times daily. Patient not taking: Reported on 11/22/2014   240 capsule   3   . metroNIDAZOLE (FLAGYL) 500 MG tablet      TK 1 T PO Q  12 H      0   . predniSONE (DELTASONE) 10 MG tablet   Oral   Take 4 tablets (40 mg total) by mouth daily. 40 mg/day for a week then 30mg  a day for a week then 20 mg a day for a week and then 10mg  a day Patient not taking: Reported on 10/25/2014   70 tablet   2   . Prenatal Vit-Fe Fumarate-FA (PRENATAL  MULTIVITAMIN) TABS tablet   Oral   Take 1 tablet by mouth daily at 12 noon.           Allergies Aspirin; Ibuprofen; and Amoxicillin  Family History  Problem Relation Age of Onset  . Kidney disease Maternal Uncle     Social History Social History  Substance Use Topics  . Smoking status: Current Every Day Smoker -- 0.50 packs/day    Types: Cigarettes  . Smokeless tobacco: Never Used  . Alcohol Use: No     Comment: pt not drinking while pregnant    Review of Systems Constitutional: No fever/chills Eyes: No visual changes. ENT: No sore throat. Cardiovascular: Denies chest pain. Respiratory: Denies shortness of breath. Gastrointestinal:  abdominal pain, nausea, vomiting.  No diarrhea.  No constipation. Genitourinary: Negative for dysuria. Musculoskeletal:  back  pain. Skin: Negative for rash. Neurological: Negative for headaches, focal weakness or numbness.  10-point ROS otherwise negative.  ____________________________________________   PHYSICAL EXAM:  VITAL SIGNS: ED Triage Vitals  Enc Vitals Group     BP 04/14/15 2135 110/75 mmHg     Pulse Rate 04/14/15 2135 97     Resp 04/14/15 2135 20     Temp 04/14/15 2135 98.4 F (36.9 C)     Temp Source 04/14/15 2135 Oral     SpO2 04/14/15 2135 100 %     Weight 04/14/15 2135 160 lb (72.576 kg)     Height 04/14/15 2135 5\' 3"  (1.6 m)     Head Cir --      Peak Flow --      Pain Score 04/14/15 2137 8     Pain Loc --      Pain Edu? --      Excl. in [place]? --     Constitutional: Alert and oriented. Well appearing and in moderate distress. Eyes: Conjunctivae are normal. PERRL. EOMI. Head: Atraumatic. Nose: No congestion/rhinnorhea. Mouth/Throat: Mucous membranes are moist.  Oropharynx non-erythematous. Cardiovascular: Normal rate, regular rhythm. Grossly normal heart sounds.  Good peripheral circulation. Respiratory: Normal respiratory effort.  No retractions. Lungs CTAB. Gastrointestinal: Soft with epigastric and right upper quadrant tenderness to palpation. No distention. Positive bowel sounds, no CVA tenderness Musculoskeletal: No lower extremity tenderness nor edema.   Neurologic:  Normal speech and language.  Skin:  Skin is warm, dry and intact.  Psychiatric: Mood and affect are normal.   ____________________________________________   LABS (all labs ordered are listed, but only abnormal results are displayed)  Labs Reviewed  CBC - Abnormal; Notable for the following:    WBC 20.6 (*)    All other components within normal limits  HCG, QUANTITATIVE, PREGNANCY - Abnormal; Notable for the following:    hCG, Beta Chain, Quant, S 1780 (*)    All other components within normal limits  COMPREHENSIVE METABOLIC PANEL - Abnormal; Notable for the following:    Glucose, Bld 107 (*)    Anion gap  3 (*)    All other components within normal limits  URINALYSIS COMPLETEWITH MICROSCOPIC ([facility] ONLY) - Abnormal; Notable for the following:    Color, Urine YELLOW (*)    APPearance CLEAR (*)    pH 9.0 (*)    Leukocytes, UA TRACE (*)    Bacteria, UA RARE (*)    Squamous Epithelial / LPF 0-5 (*)    All other components within normal limits  POCT PREGNANCY, URINE - Abnormal; Notable for the following:    Preg Test, Ur POSITIVE (*)    All other components  within normal limits  LIPASE, BLOOD  ABO/RH   ____________________________________________  EKG  None ____________________________________________  RADIOLOGY  Ultrasound abdomen: Unremarkable ultrasound of the right upper quadrant ____________________________________________   PROCEDURES  Procedure(s) performed: None  Critical Care performed: No  ____________________________________________   INITIAL IMPRESSION / ASSESSMENT AND PLAN / ED COURSE  Pertinent labs & imaging results that were available during my care of the patient were reviewed by me and considered in my medical decision making (see chart for details).  This is a 25 year old female who is [redacted] weeks pregnant who comes into the hospital today with upper abdominal and epigastric tenderness to palpation as well as back pain on the right side. I am concerned that the patient may have some pancreatic or biliary disease. I will give the patient a liter of normal saline as well as some Reglan and I will have her obtain a right upper quadrant ultrasound.  The patient received some morphine for her pain as well as a GI cocktail and she is feeling improved at this time. I did give her a liter of normal saline. I discussed with the patient that she may have some gastritis or reflux which could be related to the pregnancy hormone. The patient does have a white blood cell count of 20.6 but she has had some chronically elevated white blood cell counts previously. The patient's  urine does not show any bacteria or white blood cells. I feel at this point the patient will be discharged home and follow up with her GI physician. I discussed this with the patient and her mother and they do understand and agree with the plan as stated.  ____________________________________________   FINAL CLINICAL IMPRESSION(S) / ED DIAGNOSES  Final diagnoses:  Abdominal pain  Epigastric pain      Loney Hering, MD 04/15/15 [phone]

## 2015-04-14 NOTE — ED Notes (Signed)
Pt states no dysuria, no vaginal discharge, vaginal bleeding. Pt states is G7, A6, P0. Skin normal color, warm and dry. resps unlabored.

## 2015-04-15 LAB — URINALYSIS COMPLETE WITH MICROSCOPIC (ARMC ONLY)
Bilirubin Urine: NEGATIVE
Glucose, UA: NEGATIVE mg/dL
Hgb urine dipstick: NEGATIVE
Ketones, ur: NEGATIVE mg/dL
Nitrite: NEGATIVE
Protein, ur: NEGATIVE mg/dL
Specific Gravity, Urine: 1.018 (ref 1.005–1.030)
pH: 9 — ABNORMAL HIGH (ref 5.0–8.0)

## 2015-04-15 LAB — COMPREHENSIVE METABOLIC PANEL
ALBUMIN: 3.9 g/dL (ref 3.5–5.0)
ALK PHOS: 43 U/L (ref 38–126)
ALT: 19 U/L (ref 14–54)
AST: 20 U/L (ref 15–41)
Anion gap: 3 — ABNORMAL LOW (ref 5–15)
BILIRUBIN TOTAL: 0.6 mg/dL (ref 0.3–1.2)
BUN: 8 mg/dL (ref 6–20)
CO2: 28 mmol/L (ref 22–32)
CREATININE: 0.47 mg/dL (ref 0.44–1.00)
Calcium: 9.3 mg/dL (ref 8.9–10.3)
Chloride: 106 mmol/L (ref 101–111)
GFR calc Af Amer: >60 mL/min (ref >60)
GLUCOSE: 107 mg/dL — AB (ref 65–99)
POTASSIUM: 3.6 mmol/L (ref 3.5–5.1)
Sodium: 137 mmol/L (ref 135–145)
TOTAL PROTEIN: 6.9 g/dL (ref 6.5–8.1)

## 2015-04-15 LAB — LIPASE, BLOOD: Lipase: 20 U/L (ref 11–51)

## 2015-04-15 MED ORDER — FAMOTIDINE 40 MG PO TABS: 40.0000 mg | ORAL_TABLET | Freq: Every evening | ORAL | Status: DC

## 2015-04-15 MED ORDER — ALUM & MAG HYDROXIDE-SIMETH 200-200-20 MG/5ML PO SUSP: 15.0000 mL | Freq: Four times a day (QID) | ORAL | Status: DC | PRN

## 2015-04-15 MED ORDER — METOCLOPRAMIDE HCL 10 MG PO TABS: 10.0000 mg | ORAL_TABLET | Freq: Three times a day (TID) | ORAL | Status: DC | PRN

## 2015-04-15 MED ORDER — FAMOTIDINE 20 MG PO TABS
40.0000 mg | ORAL_TABLET | Freq: Once | ORAL | Status: AC
  Administered 2015-04-15: 40 mg via ORAL
  Filled 2015-04-15: qty 2

## 2015-04-15 MED ORDER — ALUM & MAG HYDROXIDE-SIMETH 200-200-20 MG/5ML PO SUSP
15.0000 mL | Freq: Once | ORAL | Status: AC
  Administered 2015-04-15: 15 mL via ORAL
  Filled 2015-04-15: qty 30

## 2015-04-15 NOTE — Discharge Instructions (Signed)

## 2015-04-16 ENCOUNTER — Encounter: Payer: Self-pay | Admitting: Emergency Medicine

## 2015-04-16 ENCOUNTER — Emergency Department
Admission: EM | Admit: 2015-04-16 | Discharge: 2015-04-16 | Disposition: A | Discharge status: Home / Self Care | Payer: Medicaid Other | Attending: Emergency Medicine | Admitting: Emergency Medicine

## 2015-04-16 DIAGNOSIS — R8271 Bacteriuria: Secondary | ICD-10-CM | POA: Diagnosis not present

## 2015-04-16 DIAGNOSIS — Z79899 Other long term (current) drug therapy: Secondary | ICD-10-CM | POA: Diagnosis not present

## 2015-04-16 DIAGNOSIS — Z880 Allergy status to penicillin: Secondary | ICD-10-CM | POA: Diagnosis not present

## 2015-04-16 DIAGNOSIS — O2391 Unspecified genitourinary tract infection in pregnancy, first trimester: Secondary | ICD-10-CM | POA: Diagnosis not present

## 2015-04-16 DIAGNOSIS — O200 Threatened abortion: Secondary | ICD-10-CM | POA: Diagnosis not present

## 2015-04-16 DIAGNOSIS — O99331 Smoking (tobacco) complicating pregnancy, first trimester: Secondary | ICD-10-CM | POA: Insufficient documentation

## 2015-04-16 DIAGNOSIS — F419 Anxiety disorder, unspecified: Secondary | ICD-10-CM | POA: Insufficient documentation

## 2015-04-16 DIAGNOSIS — Z792 Long term (current) use of antibiotics: Secondary | ICD-10-CM | POA: Diagnosis not present

## 2015-04-16 DIAGNOSIS — F17210 Nicotine dependence, cigarettes, uncomplicated: Secondary | ICD-10-CM | POA: Diagnosis not present

## 2015-04-16 DIAGNOSIS — O99341 Other mental disorders complicating pregnancy, first trimester: Secondary | ICD-10-CM | POA: Diagnosis not present

## 2015-04-16 DIAGNOSIS — Z3A01 Less than 8 weeks gestation of pregnancy: Secondary | ICD-10-CM | POA: Insufficient documentation

## 2015-04-16 DIAGNOSIS — O209 Hemorrhage in early pregnancy, unspecified: Secondary | ICD-10-CM | POA: Diagnosis present

## 2015-04-16 LAB — CBC WITH DIFFERENTIAL/PLATELET
BASOS PCT: 0 %
Basophils Absolute: 0 10*3/uL (ref 0–0.1)
EOS PCT: 3 %
Eosinophils Absolute: 0.5 10*3/uL (ref 0–0.7)
HCT: 37.3 % (ref 35.0–47.0)
HEMOGLOBIN: 12.6 g/dL (ref 12.0–16.0)
LYMPHS PCT: 32 %
Lymphs Abs: 5.7 10*3/uL — ABNORMAL HIGH (ref 1.0–3.6)
MCH: 29.7 pg (ref 26.0–34.0)
MCHC: 33.9 g/dL (ref 32.0–36.0)
MCV: 87.6 fL (ref 80.0–100.0)
MONOS PCT: 7 %
Monocytes Absolute: 1.3 10*3/uL — ABNORMAL HIGH (ref 0.2–0.9)
NEUTROS PCT: 58 %
Neutro Abs: 10.4 10*3/uL — ABNORMAL HIGH (ref 1.4–6.5)
Platelets: 254 10*3/uL (ref 150–440)
RBC: 4.25 MIL/uL (ref 3.80–5.20)
RDW: 13.9 % (ref 11.5–14.5)
WBC: 17.9 10*3/uL — ABNORMAL HIGH (ref 3.6–11.0)

## 2015-04-16 LAB — HCG, QUANTITATIVE, PREGNANCY: hCG, Beta Chain, Quant, S: 1303 m[IU]/mL — ABNORMAL HIGH (ref <5)

## 2015-04-16 MED ORDER — ONDANSETRON 4 MG PO TBDP
4.0000 mg | ORAL_TABLET | Freq: Once | ORAL | Status: AC
  Administered 2015-04-16: 4 mg via ORAL
  Filled 2015-04-16: qty 1

## 2015-04-16 MED ORDER — NITROFURANTOIN MONOHYD MACRO 100 MG PO CAPS: 100.0000 mg | ORAL_CAPSULE | Freq: Two times a day (BID) | ORAL | Status: AC

## 2015-04-16 MED ORDER — ACETAMINOPHEN 500 MG PO TABS
1000.0000 mg | ORAL_TABLET | Freq: Once | ORAL | Status: AC
  Administered 2015-04-16: 1000 mg via ORAL
  Filled 2015-04-16: qty 2

## 2015-04-16 NOTE — ED Notes (Signed)
Pt states she woke up this morning with severe lower abd pain and lower back pain with vaginal bleeding.  Currently [redacted] weeks pregnant. was seen by [facility] ob gyn yesterday with ultrasound performed . pt told she was having a miscarriage and that she may need to have a d and c performed; had no bleeding at that time.  Pt states she has a hx of the same. Passing small clot but denies passing any large pieces of tissue. Pt appears slightly anxious. Tearful during triage.

## 2015-04-16 NOTE — Discharge Instructions (Signed)
Stay hydrated.   You are likely going to have some bleeding and spotting and cramps.   See OB in 2 days for HCG check. You should talk to [facility] about getting testing for reasons for miscarriages.   Take reglan prescribed yesterday for nausea.  You have some bacteria in your urine so take macrobid.   Take tylenol for pain  Return to ER if you have severe abdominal cramps, vomiting, fever.

## 2015-04-16 NOTE — ED Provider Notes (Signed)
CSN: [id number]     Arrival date & time 04/16/15  0541 History   First MD Initiated Contact with Patient 04/16/15 0802     Chief Complaint  Patient presents with  . Vaginal Bleeding  . Threatened Miscarriage     (Consider location/radiation/quality/duration/timing/severity/associated sxs/prior Treatment) The history is provided by the patient.  Kathleen Owen is a 25 y.o. female hx of recurrent miscarriage, crohn's disease, who presenting with lower abdominal pain, vaginal bleeding. Was seen yesterday at [facility] and had [place] to confirm IUP. Was also seen last night in the ED for epigastric pain and had unremarkable CMP and unremarkable RUQ [place], WBC 20 baseline with some bacteriuria. Has more spotting since yesterday and lower abdominal cramps. Patient had 3 miscarriages previously and was concerned for another miscarriage.    Past Medical History  Diagnosis Date  . Miscarriage 07/18/2014  . 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 CD (Crohn's disease) ([place]) 07/30/2014  . Crohn's disease [facility]) Jan 2016  . Bacterial vaginosis August 2016    recurrent BV   Past Surgical History  Procedure Laterality Date  . Tonsillectomy     Family History  Problem Relation Age of Onset  . Kidney disease Maternal Uncle    Social History  Substance Use Topics  . Smoking status: Current Every Day Smoker -- 0.50 packs/day    Types: Cigarettes  . Smokeless tobacco: Never Used  . Alcohol Use: No     Comment: pt not drinking while pregnant   OB History    Gravida Para Term Preterm AB TAB SAB Ectopic Multiple Living   7    6 1 4         Review of Systems  Gastrointestinal: Positive for abdominal pain.  Genitourinary: Positive for vaginal bleeding.  All other systems reviewed and are negative.     Allergies  Aspirin; Ibuprofen; and Amoxicillin  Home Medications   Prior to Admission medications   Medication Sig Start Date End Date Taking? Authorizing Provider  alum & mag hydroxide-simeth  (MAALOX ADVANCED) 200-200-20 MG/5ML suspension Take 15 mLs by mouth every 6 (six) hours as needed for indigestion or heartburn. 04/15/15   Loney Hering, MD  butalbital-acetaminophen-caffeine [facility]) [phone] MG tablet Take 1-2 tablets by mouth every 6 (six) hours as needed for headache. 02/28/15 02/28/16  Harvest Dark, MD  famotidine (PEPCID) 40 MG tablet Take 1 tablet (40 mg total) by mouth every evening. 04/15/15 04/14/16  Loney Hering, MD  HYDROcodone-acetaminophen (NORCO/VICODIN) 5-325 MG per tablet TK 1 T PO Q 4 TO 6 H  PRN P 10/12/14   Historical Provider, MD  mesalamine (PENTASA) 500 MG CR capsule Take 2 capsules (1,000 mg total) by mouth 4 (four) times daily. Patient not taking: Reported on 11/22/2014 10/25/14   Lucilla Lame, MD  metoCLOPramide (REGLAN) 10 MG tablet Take 1 tablet (10 mg total) by mouth every 8 (eight) hours as needed for nausea or vomiting. 04/15/15 04/14/16  Loney Hering, MD  metroNIDAZOLE (FLAGYL) 500 MG tablet TK 1 T PO Q  12 H 10/12/14   Historical Provider, MD  predniSONE (DELTASONE) 10 MG tablet Take 4 tablets (40 mg total) by mouth daily. 40 mg/day for a week then 30mg  a day for a week then 20 mg a day for a week and then 10mg  a day Patient not taking: Reported on 10/25/2014 07/30/14 07/30/15  Lucilla Lame, MD  Prenatal Vit-Fe Fumarate-FA (PRENATAL MULTIVITAMIN) TABS tablet Take 1 tablet by mouth daily at 12 noon.  Historical Provider, MD   BP 113/67 mmHg  Pulse 108  Temp(Src) 97.9 F (36.6 C) (Oral)  Resp 20  Ht 5\' 3"  (1.6 m)  Wt 160 lb (72.576 kg)  BMI 28.35 kg/m2  SpO2 99%  LMP 02/24/2015 (Exact Date) Physical Exam  Constitutional: She is oriented to person, place, and time.  Anxious, tearful   HENT:  Head: Normocephalic.  Mouth/Throat: Oropharynx is clear and moist.  Eyes: Conjunctivae are normal. Pupils are equal, round, and reactive to light.  Neck: Normal range of motion. Neck supple.  Cardiovascular: Normal rate, regular rhythm and normal  heart sounds.   Pulmonary/Chest: Effort normal and breath sounds normal. No respiratory distress. She has no wheezes. She has no rales.  Abdominal: Soft. Bowel sounds are normal.  ? Gravid uterus, mild tenderness. No adnexal tenderness   Musculoskeletal: Normal range of motion.  Neurological: She is alert and oriented to person, place, and time.  Skin: Skin is warm and dry.  Psychiatric: She has a normal mood and affect. Her behavior is normal. Judgment and thought content normal.  Nursing note and vitals reviewed.   ED Course  Procedures (including critical care time)  EMERGENCY DEPARTMENT [place] PREGNANCY "Study: Limited Ultrasound of the Pelvis"  INDICATIONS:Pregnancy(required) Multiple views of the uterus and pelvic cavity are obtained with a multi-frequency probe.  APPROACH:Transabdominal   PERFORMED BY: Myself  IMAGES ARCHIVED?: Yes  LIMITATIONS: Body habitus  PREGNANCY FREE FLUID: None  PREGNANCY UTERUS FINDINGS:Uterus enlarged ADNEXAL FINDINGS:Left ovary not seen and Right ovary not seen  PREGNANCY FINDINGS: Intrauterine gestational sac noted  INTERPRETATION: Non-viable intrauterine pregnancy  GESTATIONAL AGE, ESTIMATE:   FETAL HEART RATE: unable  COMMENT(Estimate of Gestational Age):  Possible gestational sac, unable to visualize IUP      Labs Review Labs Reviewed  HCG, QUANTITATIVE, PREGNANCY - Abnormal; Notable for the following:    hCG, Beta Chain, Quant, S 1303 (*)    All other components within normal limits  CBC WITH DIFFERENTIAL/PLATELET - Abnormal; Notable for the following:    WBC 17.9 (*)    Neutro Abs 10.4 (*)    Lymphs Abs 5.7 (*)    Monocytes Absolute 1.3 (*)    All other components within normal limits    Imaging Review US Abdomen Limited Ruq  04/15/2015  CLINICAL DATA:  Acute onset of epigastric abdominal pain. Initial encounter. EXAM: US ABDOMEN LIMITED - RIGHT UPPER QUADRANT COMPARISON:  CT of the abdomen pelvis from 01/06/2014, and  abdominal MRI performed 03/23/2014 FINDINGS: Gallbladder: No gallstones or wall thickening visualized. No sonographic Murphy sign noted by sonographer. Common bile duct: Diameter: 0.2 cm, within normal limits in caliber. Liver: No focal lesion identified. Within normal limits in parenchymal echogenicity. IMPRESSION: Unremarkable ultrasound of the right upper quadrant. Electronically Signed   By: Garald Balding M.D.   On: 04/15/2015 00:37   I have personally reviewed and evaluated these images and lab results as part of my medical decision-making.   EKG Interpretation None      MDM   Final diagnoses:  None    Kathleen Owen is a 25 y.o. female here with lower abdominal cramps, vaginal bleeding. Bedside US showed no IUP, ? Blood vs gestational sac. Beta HCG was 1700 yesterday, this morning is 1303. WBC still 18. Had some bacteriuria on UA yesterday so will give macrobid. Has follow up at [facility]. Recommend follow up in 2 days for HCG check. Likely going to have some bleeding. This is her third miscarriage so recommend  further workup and testing with OB.     Wandra Arthurs, MD 04/16/15 [phone]

## 2015-07-26 ENCOUNTER — Emergency Department
Admission: EM | Admit: 2015-07-26 | Discharge: 2015-07-26 | Disposition: A | Discharge status: Home / Self Care | Payer: Self-pay | Attending: Emergency Medicine | Admitting: Emergency Medicine

## 2015-07-26 ENCOUNTER — Encounter: Payer: Self-pay | Admitting: Emergency Medicine

## 2015-07-26 ENCOUNTER — Emergency Department: Payer: Self-pay

## 2015-07-26 DIAGNOSIS — J45909 Unspecified asthma, uncomplicated: Secondary | ICD-10-CM | POA: Insufficient documentation

## 2015-07-26 DIAGNOSIS — R197 Diarrhea, unspecified: Secondary | ICD-10-CM

## 2015-07-26 DIAGNOSIS — R112 Nausea with vomiting, unspecified: Secondary | ICD-10-CM | POA: Insufficient documentation

## 2015-07-26 DIAGNOSIS — K529 Noninfective gastroenteritis and colitis, unspecified: Secondary | ICD-10-CM | POA: Insufficient documentation

## 2015-07-26 DIAGNOSIS — Z79899 Other long term (current) drug therapy: Secondary | ICD-10-CM | POA: Insufficient documentation

## 2015-07-26 DIAGNOSIS — F17210 Nicotine dependence, cigarettes, uncomplicated: Secondary | ICD-10-CM | POA: Insufficient documentation

## 2015-07-26 LAB — URINALYSIS COMPLETE WITH MICROSCOPIC (ARMC ONLY)
BACTERIA UA: NONE SEEN
Bilirubin Urine: NEGATIVE
GLUCOSE, UA: NEGATIVE mg/dL
Hgb urine dipstick: NEGATIVE
Ketones, ur: NEGATIVE mg/dL
Leukocytes, UA: NEGATIVE
Nitrite: NEGATIVE
Protein, ur: 30 mg/dL — AB
Specific Gravity, Urine: 1.023 (ref 1.005–1.030)
pH: 7 (ref 5.0–8.0)

## 2015-07-26 LAB — COMPREHENSIVE METABOLIC PANEL
ALT: 21 U/L (ref 14–54)
AST: 26 U/L (ref 15–41)
Albumin: 4.7 g/dL (ref 3.5–5.0)
Alkaline Phosphatase: 52 U/L (ref 38–126)
Anion gap: 10 (ref 5–15)
BILIRUBIN TOTAL: 0.4 mg/dL (ref 0.3–1.2)
BUN: 7 mg/dL (ref 6–20)
CHLORIDE: 105 mmol/L (ref 101–111)
CO2: 24 mmol/L (ref 22–32)
CREATININE: 0.65 mg/dL (ref 0.44–1.00)
Calcium: 9.6 mg/dL (ref 8.9–10.3)
GFR calc Af Amer: >60 mL/min (ref >60)
Glucose, Bld: 110 mg/dL — ABNORMAL HIGH (ref 65–99)
Potassium: 4 mmol/L (ref 3.5–5.1)
Sodium: 139 mmol/L (ref 135–145)
Total Protein: 8 g/dL (ref 6.5–8.1)

## 2015-07-26 LAB — CHLAMYDIA/NGC RT PCR (ARMC ONLY)
Chlamydia Tr: NOT DETECTED
N GONORRHOEAE: NOT DETECTED

## 2015-07-26 LAB — WET PREP, GENITAL
Clue Cells Wet Prep HPF POC: NONE SEEN
Trich, Wet Prep: NONE SEEN
YEAST WET PREP: NONE SEEN

## 2015-07-26 LAB — CBC
HEMATOCRIT: 41.5 % (ref 35.0–47.0)
Hemoglobin: 13.9 g/dL (ref 12.0–16.0)
MCH: 29.1 pg (ref 26.0–34.0)
MCHC: 33.6 g/dL (ref 32.0–36.0)
MCV: 86.8 fL (ref 80.0–100.0)
Platelets: 258 10*3/uL (ref 150–440)
RBC: 4.78 MIL/uL (ref 3.80–5.20)
RDW: 13.7 % (ref 11.5–14.5)
WBC: 21.8 10*3/uL — ABNORMAL HIGH (ref 3.6–11.0)

## 2015-07-26 LAB — LIPASE, BLOOD: Lipase: 19 U/L (ref 11–51)

## 2015-07-26 LAB — PREGNANCY, URINE: PREG TEST UR: NEGATIVE

## 2015-07-26 MED ORDER — SODIUM CHLORIDE 0.9 % IV BOLUS (SEPSIS)
1000.0000 mL | Freq: Once | INTRAVENOUS | Status: AC
  Administered 2015-07-26: 1000 mL via INTRAVENOUS

## 2015-07-26 MED ORDER — IOPAMIDOL (ISOVUE-300) INJECTION 61%
100.0000 mL | Freq: Once | INTRAVENOUS | Status: AC | PRN
  Administered 2015-07-26: 100 mL via INTRAVENOUS

## 2015-07-26 MED ORDER — ONDANSETRON HCL 4 MG/2ML IJ SOLN
4.0000 mg | Freq: Once | INTRAMUSCULAR | Status: AC
  Administered 2015-07-26: 4 mg via INTRAVENOUS
  Filled 2015-07-26: qty 2

## 2015-07-26 MED ORDER — CIPROFLOXACIN HCL 500 MG PO TABS
500.0000 mg | ORAL_TABLET | Freq: Once | ORAL | Status: AC
  Administered 2015-07-26: 500 mg via ORAL
  Filled 2015-07-26: qty 1

## 2015-07-26 MED ORDER — MORPHINE SULFATE (PF) 2 MG/ML IV SOLN
2.0000 mg | Freq: Once | INTRAVENOUS | Status: AC
  Administered 2015-07-26: 2 mg via INTRAVENOUS
  Filled 2015-07-26: qty 1

## 2015-07-26 MED ORDER — DOXYCYCLINE HYCLATE 100 MG PO TABS: 100.0000 mg | ORAL_TABLET | Freq: Once | ORAL | Status: DC

## 2015-07-26 MED ORDER — ONDANSETRON HCL 4 MG PO TABS: 4.0000 mg | ORAL_TABLET | Freq: Three times a day (TID) | ORAL | Status: DC | PRN

## 2015-07-26 MED ORDER — DIATRIZOATE MEGLUMINE & SODIUM 66-10 % PO SOLN
15.0000 mL | Freq: Once | ORAL | Status: AC
  Administered 2015-07-26: 15 mL via ORAL

## 2015-07-26 MED ORDER — AZITHROMYCIN 500 MG PO TABS: 2000.0000 mg | ORAL_TABLET | Freq: Once | ORAL | Status: DC

## 2015-07-26 MED ORDER — CIPROFLOXACIN HCL 500 MG PO TABS
500.0000 mg | ORAL_TABLET | Freq: Two times a day (BID) | ORAL | Status: DC
Start: 2015-07-26 — End: 2016-08-27

## 2015-07-26 MED ORDER — DEXTROSE 5 % IV SOLN: 250.0000 mg | Freq: Once | INTRAVENOUS | Status: DC

## 2015-07-26 NOTE — ED Provider Notes (Signed)
[name] Emergency Department Provider Note   ____________________________________________  Time seen:  I have reviewed the triage vital signs and the triage nursing note.  HISTORY  Chief Complaint Emesis and Diarrhea   Historian Patient  HPI Kathleen Owen is a 25 y.o. female history Crohn's disease, here for her nausea vomiting and diarrhea multiple episodes of both since this morning. Nonbloody and nonbilious vomiting. Nonbloody, just watery diarrhea. Denies fever. She has had some abdominal pain in the suprapubic and lower abdominal area for a day or so.  Pain is moderate about 7 or 8 out of 10. She still feeling nauseated.  Nothing really makes it worse or better.    Past Medical History  Diagnosis Date  . Miscarriage 07/18/2014  . 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 CD (Crohn's disease) ([place]) 07/30/2014  . Crohn's disease [facility]) Jan 2016  . Bacterial vaginosis August 2016    recurrent BV    Patient Active Problem List   Diagnosis Date Noted  . 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 Smoker 11/22/2014  . Recurrent vaginitis 11/22/2014  . 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 CD (Crohn's disease) ([place]) 07/30/2014  . Body mass index (BMI) of 27.0-27.9 in adult 10/03/2013    Past Surgical History  Procedure Laterality Date  . Tonsillectomy      Current Outpatient Rx  Name  Route  Sig  Dispense  Refill  . alum & mag hydroxide-simeth (MAALOX ADVANCED) 200-200-20 MG/5ML suspension   Oral   Take 15 mLs by mouth every 6 (six) hours as needed for indigestion or heartburn.   150 mL   0   . butalbital-acetaminophen-caffeine (FIORICET) 50-325-40 MG tablet   Oral   Take 1-2 tablets by mouth every 6 (six) hours as needed for headache.   20 tablet   0   . ciprofloxacin (CIPRO) 500 MG tablet   Oral   Take 1 tablet (500 mg total) by mouth 2 (two) times daily.   14 tablet   0   . famotidine (PEPCID) 40 MG tablet    Oral   Take 1 tablet (40 mg total) by mouth every evening.   15 tablet   0   . HYDROcodone-acetaminophen (NORCO/VICODIN) 5-325 MG per tablet      TK 1 T PO Q 4 TO 6 H  PRN P      0   . mesalamine (PENTASA) 500 MG CR capsule   Oral   Take 2 capsules (1,000 mg total) by mouth 4 (four) times daily. Patient not taking: Reported on 11/22/2014   240 capsule   3   . metoCLOPramide (REGLAN) 10 MG tablet   Oral   Take 1 tablet (10 mg total) by mouth every 8 (eight) hours as needed for nausea or vomiting.   20 tablet   0   . metroNIDAZOLE (FLAGYL) 500 MG tablet      TK 1 T PO Q  12 H      0   . ondansetron (ZOFRAN) 4 MG tablet   Oral   Take 1 tablet (4 mg total) by mouth every 8 (eight) hours as needed for nausea or vomiting.   10 tablet   0   . predniSONE (DELTASONE) 10 MG tablet   Oral   Take 4 tablets (40 mg total) by mouth daily. 40 mg/day for a week then 30mg  a day for a week then 20 mg a day for a week and then 10mg  a day Patient  not taking: Reported on 10/25/2014   70 tablet   2   . Prenatal Vit-Fe Fumarate-FA (PRENATAL MULTIVITAMIN) TABS tablet   Oral   Take 1 tablet by mouth daily at 12 noon.           Allergies Aspirin; Ibuprofen; and Amoxicillin  Family History  Problem Relation Age of Onset  . Kidney disease Maternal Uncle     Social History Social History  Substance Use Topics  . Smoking status: Current Every Day Smoker -- 0.50 packs/day    Types: Cigarettes  . Smokeless tobacco: Never Used  . Alcohol Use: No     Comment: pt not drinking while pregnant    Review of Systems  Constitutional: Negative for fever. Eyes: Negative for visual changes. ENT: Negative for sore throat. Cardiovascular: Negative for chest pain. Respiratory: Negative for shortness of breath. Gastrointestinal:As per history of present illness. Genitourinary: Negative for dysuria.  Denies vaginal bleeding or pelvic discharge. No history of prior STD  exposure. Musculoskeletal: Negative for back pain. Skin: Negative for rash. Neurological: Negative for headache. 10 point Review of Systems otherwise negative ____________________________________________   PHYSICAL EXAM:  VITAL SIGNS: ED Triage Vitals  Enc Vitals Group     BP 07/26/15 1049 109/77 mmHg     Pulse Rate 07/26/15 1049 95     Resp 07/26/15 1049 20     Temp 07/26/15 1049 99 F (37.2 C)     Temp Source 07/26/15 1049 Oral     SpO2 07/26/15 1049 100 %     Weight 07/26/15 1049 160 lb (72.576 kg)     Height 07/26/15 1049 5\' 3"  (1.6 m)     Head Cir --      Peak Flow --      Pain Score 07/26/15 1051 6     Pain Loc --      Pain Edu? --      Excl. in [place]? --      Constitutional: Alert and oriented. Well appearing and in no distress. HEENT   Head: Normocephalic and atraumatic.      Eyes: Conjunctivae are normal. PERRL. Normal extraocular movements.      Ears:         Nose: No congestion/rhinnorhea.   Mouth/Throat: Mucous membranes are moist.   Neck: No stridor. Cardiovascular/Chest: Normal rate, regular rhythm.  No murmurs, rubs, or gallops. Respiratory: Normal respiratory effort without tachypnea nor retractions. Breath sounds are clear and equal bilaterally. No wheezes/rales/rhonchi. Gastrointestinal: Soft. No distention, no guarding, no rebound. Moderate lower abdominal tenderness suprapubic and right lower quadrant and left lower quadrant.  Genitourinary/rectal: Small white discharge, mild cervicitis. Musculoskeletal: Nontender with normal range of motion in all extremities. No joint effusions.  No lower extremity tenderness.  No edema. Neurologic:  Normal speech and language. No gross or focal neurologic deficits are appreciated. Skin:  Skin is warm, dry and intact. No rash noted. Psychiatric: Mood and affect are normal. Speech and behavior are normal. Patient exhibits appropriate insight and judgment.  ____________________________________________    EKG I, Lisa Roca, MD, the attending physician have personally viewed and interpreted all ECGs.  None ____________________________________________  LABS (pertinent positives/negatives)  Labs Reviewed  WET PREP, GENITAL - Abnormal; Notable for the following:    WBC, Wet Prep HPF POC RARE (*)    All other components within normal limits  COMPREHENSIVE METABOLIC PANEL - Abnormal; Notable for the following:    Glucose, Bld 110 (*)    All other components within normal limits  CBC - Abnormal; Notable for the following:    WBC 21.8 (*)    All other components within normal limits  URINALYSIS COMPLETEWITH MICROSCOPIC ([facility] ONLY) - Abnormal; Notable for the following:    Color, Urine YELLOW (*)    APPearance HAZY (*)    Protein, ur 30 (*)    Squamous Epithelial / LPF 6-30 (*)    All other components within normal limits  CHLAMYDIA/NGC RT PCR ([facility] ONLY)  C DIFFICILE QUICK SCREEN W PCR REFLEX  GASTROINTESTINAL PANEL BY PCR, STOOL (REPLACES STOOL CULTURE)  LIPASE, BLOOD  PREGNANCY, URINE    ____________________________________________  RADIOLOGY All Xrays were viewed by me. Imaging interpreted by Radiologist.  CT abdomen and pelvis with contrast:  IMPRESSION: Loss of the normal haustral pattern along the distal descending and sigmoid colon, with question of minimal wall thickening, which may reflect a mild infectious or inflammatory process. __________________________________________  PROCEDURES  Procedure(s) performed: None  Critical Care performed: None  ____________________________________________   ED COURSE / ASSESSMENT AND PLAN  Pertinent labs & imaging results that were available during my care of the patient were reviewed by me and considered in my medical decision making (see chart for details).   Patient's here mainly because of nausea vomiting diarrhea which sounds likely consistent with gastritis, this is very common in the community  right now.  She is overall well-appearing and stable. However on abdomen palpation she does seem to have fairly significant lower abdominal discomfort. However is significantly elevated, it looks like previously when she is in the ED is also elevated, I'm not sure whether or not she has some sort of underlying typical elevated white blood count, however in the setting of infection, and lower abdominal pain, I discussed with her risks versus benefit of obtaining CT to rule out appendicitis and we chose to proceed.  Her CT did not show acute appendicitis, but there was some wall thickening of the colon.  She states that she has blood work that showed she had Crohn's, but a colonoscopy that has never shown evidence of active Crohn's. I spoke with gastroenterologist on-call, Dr. Vira Agar, who recommends no it treatment with respect to Crohn's, but would recommend going ahead and treating with Cipro. He also recommended sending C. difficile and bio fire. Patient will be discharged on Cipro.  Her pelvic exam initially was a little concerning for cervicitis and I started ordering antibiotics, however her gonorrhea and Chlamydia came back negative, and I cancelled those antibiotic orders.    CONSULTATIONS:   Dr. Vira Agar, gastroenterology, recommended sending bio fire and C. difficile, and covering with Cipro for now.   Patient / Family / Caregiver informed of clinical course, medical decision-making process, and agree with plan.   I discussed return precautions, follow-up instructions, and discharged instructions with patient and/or family.   ___________________________________________   FINAL CLINICAL IMPRESSION(S) / ED DIAGNOSES   Final diagnoses:  Nausea vomiting and diarrhea  Colitis              Note: This dictation was prepared with Dragon dictation. Any transcriptional errors that result from this process are unintentional   Lisa Roca, MD 07/26/15 2109

## 2015-07-26 NOTE — ED Notes (Signed)
Pt to ed with c/o vomiting and diarrhea that started today at 7am.

## 2015-07-26 NOTE — ED Notes (Signed)
Reviewed d/c instructions, follow-up care, prescriptions with pt. Pt verbalized understanding

## 2015-07-26 NOTE — Discharge Instructions (Signed)
You were evaluated for vomiting and diarrhea and your being treated for colitis, infection/inflammation seen on your CT scan. You're being treated with antibiotic Cipro.   Return to emergency department for any worsening condition including fever, abdominal pain, weakness or numbness, dizziness or passing out, or any other symptoms concerning to you.   Diarrhea Diarrhea is frequent loose and watery bowel movements. It can cause you to feel weak and dehydrated. Dehydration can cause you to become tired and thirsty, have a dry mouth, and have decreased urination that often is dark yellow. Diarrhea is a sign of another problem, most often an infection that will not last long. In most cases, diarrhea typically lasts 2-3 days. However, it can last longer if it is a sign of something more serious. It is important to treat your diarrhea as directed by your caregiver to lessen or prevent future episodes of diarrhea. CAUSES  Some common causes include:  Gastrointestinal infections caused by viruses, bacteria, or parasites.  Food poisoning or food allergies.  Certain medicines, such as antibiotics, chemotherapy, and laxatives.  Artificial sweeteners and fructose.  Digestive disorders. HOME CARE INSTRUCTIONS  Ensure adequate fluid intake (hydration): Have 1 cup (8 oz) of fluid for each diarrhea episode. Avoid fluids that contain simple sugars or sports drinks, fruit juices, whole milk products, and sodas. Your urine should be clear or pale yellow if you are drinking enough fluids. Hydrate with an oral rehydration solution that you can purchase at pharmacies, retail stores, and online. You can prepare an oral rehydration solution at home by mixing the following ingredients together:   - tsp table salt.   tsp baking soda.   tsp salt substitute containing potassium chloride.  1  tablespoons sugar.  1 L (34 oz) of water.  Certain foods and beverages may increase the speed at which food moves  through the gastrointestinal (GI) tract. These foods and beverages should be avoided and include:  Caffeinated and alcoholic beverages.  High-fiber foods, such as raw fruits and vegetables, nuts, seeds, and whole grain breads and cereals.  Foods and beverages sweetened with sugar alcohols, such as xylitol, sorbitol, and mannitol.  Some foods may be well tolerated and may help thicken stool including:  Starchy foods, such as rice, toast, pasta, low-sugar cereal, oatmeal, grits, baked potatoes, crackers, and bagels.  Bananas.  Applesauce.  Add probiotic-rich foods to help increase healthy bacteria in the GI tract, such as yogurt and fermented milk products.  Wash your hands well after each diarrhea episode.  Only take over-the-counter or prescription medicines as directed by your caregiver.  Take a warm bath to relieve any burning or pain from frequent diarrhea episodes. SEEK IMMEDIATE MEDICAL CARE IF:   You are unable to keep fluids down.  You have persistent vomiting.  You have blood in your stool, or your stools are black and tarry.  You do not urinate in 6-8 hours, or there is only a small amount of very dark urine.  You have abdominal pain that increases or localizes.  You have weakness, dizziness, confusion, or light-headedness.  You have a severe headache.  Your diarrhea gets worse or does not get better.  You have a fever or persistent symptoms for more than 2-3 days.  You have a fever and your symptoms suddenly get worse. MAKE SURE YOU:   Understand these instructions.  Will watch your condition.  Will get help right away if you are not doing well or get worse.   This information is not  intended to replace advice given to you by your health care provider. Make sure you discuss any questions you have with your health care provider.   Document Released: 01/30/2002 Document Revised: 03/02/2014 Document Reviewed: 10/18/2011 Elsevier Interactive Patient  Education 2016 [employer]  Nausea and Vomiting Nausea is a sick feeling that often comes before throwing up (vomiting). Vomiting is a reflex where stomach contents come out of your mouth. Vomiting can cause severe loss of body fluids (dehydration). Children and elderly adults can become dehydrated quickly, especially if they also have diarrhea. Nausea and vomiting are symptoms of a condition or disease. It is important to find the cause of your symptoms. CAUSES   Direct irritation of the stomach lining. This irritation can result from increased acid production (gastroesophageal reflux disease), infection, food poisoning, taking certain medicines (such as nonsteroidal anti-inflammatory drugs), alcohol use, or tobacco use.  Signals from the brain.These signals could be caused by a headache, heat exposure, an inner ear disturbance, increased pressure in the brain from injury, infection, a tumor, or a concussion, pain, emotional stimulus, or metabolic problems.  An obstruction in the gastrointestinal tract (bowel obstruction).  Illnesses such as diabetes, hepatitis, gallbladder problems, appendicitis, kidney problems, cancer, sepsis, atypical symptoms of a heart attack, or eating disorders.  Medical treatments such as chemotherapy and radiation.  Receiving medicine that makes you sleep (general anesthetic) during surgery. DIAGNOSIS Your caregiver may ask for tests to be done if the problems do not improve after a few days. Tests may also be done if symptoms are severe or if the reason for the nausea and vomiting is not clear. Tests may include:  Urine tests.  Blood tests.  Stool tests.  Cultures (to look for evidence of infection).  X-rays or other imaging studies. Test results can help your caregiver make decisions about treatment or the need for additional tests. TREATMENT You need to stay well hydrated. Drink frequently but in small amounts.You may wish to drink water, sports  drinks, clear broth, or eat frozen ice pops or gelatin dessert to help stay hydrated.When you eat, eating slowly may help prevent nausea.There are also some antinausea medicines that may help prevent nausea. HOME CARE INSTRUCTIONS   Take all medicine as directed by your caregiver.  If you do not have an appetite, do not force yourself to eat. However, you must continue to drink fluids.  If you have an appetite, eat a normal diet unless your caregiver tells you differently.  Eat a variety of complex carbohydrates (rice, wheat, potatoes, bread), lean meats, yogurt, fruits, and vegetables.  Avoid high-fat foods because they are more difficult to digest.  Drink enough water and fluids to keep your urine clear or pale yellow.  If you are dehydrated, ask your caregiver for specific rehydration instructions. Signs of dehydration may include:  Severe thirst.  Dry lips and mouth.  Dizziness.  Dark urine.  Decreasing urine frequency and amount.  Confusion.  Rapid breathing or pulse. SEEK IMMEDIATE MEDICAL CARE IF:   You have blood or brown flecks (like coffee grounds) in your vomit.  You have black or bloody stools.  You have a severe headache or stiff neck.  You are confused.  You have severe abdominal pain.  You have chest pain or trouble breathing.  You do not urinate at least once every 8 hours.  You develop cold or clammy skin.  You continue to vomit for longer than 24 to 48 hours.  You have a fever. MAKE SURE YOU:  Understand these instructions.  Will watch your condition.  Will get help right away if you are not doing well or get worse.   This information is not intended to replace advice given to you by your health care provider. Make sure you discuss any questions you have with your health care provider.   Document Released: 02/09/2005 Document Revised: 05/04/2011 Document Reviewed: 07/09/2010 Elsevier Interactive Patient Education [employer].

## 2015-07-26 NOTE — ED Notes (Signed)
Patient states that she has had N/V/D since this morning. Patient has been unable to eat since yesterday afternoon. Patient states that she is having lower abdominal pain that started 2 days ago.

## 2015-07-27 LAB — GASTROINTESTINAL PANEL BY PCR, STOOL (REPLACES STOOL CULTURE)
ADENOVIRUS F40/41: NOT DETECTED
ASTROVIRUS: NOT DETECTED
CRYPTOSPORIDIUM: NOT DETECTED
CYCLOSPORA CAYETANENSIS: NOT DETECTED
Campylobacter species: NOT DETECTED
E. coli O157: NOT DETECTED
ENTAMOEBA HISTOLYTICA: NOT DETECTED
ENTEROAGGREGATIVE E COLI (EAEC): NOT DETECTED
ENTEROPATHOGENIC E COLI (EPEC): NOT DETECTED
Enterotoxigenic E coli (ETEC): NOT DETECTED
GIARDIA LAMBLIA: NOT DETECTED
Norovirus GI/GII: NOT DETECTED
Plesimonas shigelloides: NOT DETECTED
ROTAVIRUS A: NOT DETECTED
Salmonella species: NOT DETECTED
Sapovirus (I, II, IV, and V): NOT DETECTED
Shiga like toxin producing E coli (STEC): NOT DETECTED
Shigella/Enteroinvasive E coli (EIEC): NOT DETECTED
VIBRIO CHOLERAE: NOT DETECTED
VIBRIO SPECIES: NOT DETECTED
YERSINIA ENTEROCOLITICA: NOT DETECTED

## 2015-07-27 LAB — C DIFFICILE QUICK SCREEN W PCR REFLEX
C DIFFICLE (CDIFF) ANTIGEN: NEGATIVE
C Diff interpretation: NEGATIVE
C Diff toxin: NEGATIVE

## 2015-09-17 DIAGNOSIS — F321 Major depressive disorder, single episode, moderate: Secondary | ICD-10-CM | POA: Insufficient documentation

## 2015-09-17 DIAGNOSIS — F411 Generalized anxiety disorder: Secondary | ICD-10-CM | POA: Insufficient documentation

## 2015-09-21 ENCOUNTER — Encounter: Payer: Self-pay | Admitting: *Deleted

## 2015-09-21 ENCOUNTER — Emergency Department
Admission: EM | Admit: 2015-09-21 | Discharge: 2015-09-22 | Disposition: A | Discharge status: Home / Self Care | Payer: Self-pay | Attending: Emergency Medicine | Admitting: Emergency Medicine

## 2015-09-21 DIAGNOSIS — F17210 Nicotine dependence, cigarettes, uncomplicated: Secondary | ICD-10-CM | POA: Insufficient documentation

## 2015-09-21 DIAGNOSIS — N760 Acute vaginitis: Secondary | ICD-10-CM | POA: Insufficient documentation

## 2015-09-21 DIAGNOSIS — B9689 Other specified bacterial agents as the cause of diseases classified elsewhere: Secondary | ICD-10-CM

## 2015-09-21 DIAGNOSIS — N83202 Unspecified ovarian cyst, left side: Secondary | ICD-10-CM | POA: Insufficient documentation

## 2015-09-21 DIAGNOSIS — O26899 Other specified pregnancy related conditions, unspecified trimester: Secondary | ICD-10-CM

## 2015-09-21 DIAGNOSIS — R102 Pelvic and perineal pain: Secondary | ICD-10-CM

## 2015-09-21 LAB — URINALYSIS COMPLETE WITH MICROSCOPIC (ARMC ONLY)
Bilirubin Urine: NEGATIVE
Glucose, UA: NEGATIVE mg/dL
HGB URINE DIPSTICK: NEGATIVE
Ketones, ur: NEGATIVE mg/dL
NITRITE: NEGATIVE
PROTEIN: NEGATIVE mg/dL
SPECIFIC GRAVITY, URINE: 1.025 (ref 1.005–1.030)
pH: 6 (ref 5.0–8.0)

## 2015-09-21 LAB — COMPREHENSIVE METABOLIC PANEL
ALBUMIN: 4.3 g/dL (ref 3.5–5.0)
ALT: 15 U/L (ref 14–54)
ANION GAP: 7 (ref 5–15)
AST: 22 U/L (ref 15–41)
Alkaline Phosphatase: 51 U/L (ref 38–126)
BILIRUBIN TOTAL: 0.4 mg/dL (ref 0.3–1.2)
BUN: 13 mg/dL (ref 6–20)
CO2: 25 mmol/L (ref 22–32)
Calcium: 9.2 mg/dL (ref 8.9–10.3)
Chloride: 104 mmol/L (ref 101–111)
Creatinine, Ser: 0.62 mg/dL (ref 0.44–1.00)
GFR calc Af Amer: >60 mL/min (ref >60)
GLUCOSE: 111 mg/dL — AB (ref 65–99)
POTASSIUM: 3.3 mmol/L — AB (ref 3.5–5.1)
Sodium: 136 mmol/L (ref 135–145)
TOTAL PROTEIN: 7.4 g/dL (ref 6.5–8.1)

## 2015-09-21 LAB — POCT PREGNANCY, URINE: PREG TEST UR: NEGATIVE

## 2015-09-21 LAB — CBC
HEMATOCRIT: 41 % (ref 35.0–47.0)
HEMOGLOBIN: 13.9 g/dL (ref 12.0–16.0)
MCH: 29.4 pg (ref 26.0–34.0)
MCHC: 33.9 g/dL (ref 32.0–36.0)
MCV: 86.8 fL (ref 80.0–100.0)
Platelets: 233 10*3/uL (ref 150–440)
RBC: 4.73 MIL/uL (ref 3.80–5.20)
RDW: 13.6 % (ref 11.5–14.5)
WBC: 17.2 10*3/uL — ABNORMAL HIGH (ref 3.6–11.0)

## 2015-09-21 LAB — LIPASE, BLOOD: Lipase: 19 U/L (ref 11–51)

## 2015-09-21 NOTE — ED Triage Notes (Signed)
Pt disclosed that she was intermittently taking flagyl that was rx'd at the end of May 2017 for BV. Pt states she has not taken the meds as rx'd because they made her sick. Pt has PMH of prior episodes of BV and it has never felt like this. Pt also states [id number].

## 2015-09-21 NOTE — ED Notes (Signed)
Patient outside lobby doors and decided she did want to stay and be seen.

## 2015-09-21 NOTE — ED Triage Notes (Signed)
Pt c/o dysuria x 2 weeks. Pt c/o sudden increase in pelvic pressure and pain tonight at 2100. Pt denies BC use and could be pregnant. Pt is tearful during triage after urinating.

## 2015-09-22 ENCOUNTER — Emergency Department: Payer: Self-pay

## 2015-09-22 ENCOUNTER — Encounter: Payer: Self-pay | Admitting: Emergency Medicine

## 2015-09-22 LAB — WET PREP, GENITAL
Sperm: NONE SEEN
Trich, Wet Prep: NONE SEEN
Yeast Wet Prep HPF POC: NONE SEEN

## 2015-09-22 LAB — CHLAMYDIA/NGC RT PCR (ARMC ONLY)
Chlamydia Tr: NOT DETECTED
N gonorrhoeae: NOT DETECTED

## 2015-09-22 LAB — HCG, QUANTITATIVE, PREGNANCY

## 2015-09-22 MED ORDER — METRONIDAZOLE 0.75 % VA GEL: 1.0000 | Freq: Two times a day (BID) | VAGINAL | 0 refills | Status: DC

## 2015-09-22 MED ORDER — OXYCODONE-ACETAMINOPHEN 5-325 MG PO TABS
1.0000 | ORAL_TABLET | Freq: Once | ORAL | Status: AC
  Administered 2015-09-22: 1 via ORAL
  Filled 2015-09-22: qty 1

## 2015-09-22 MED ORDER — OXYCODONE-ACETAMINOPHEN 5-325 MG PO TABS: 1.0000 | ORAL_TABLET | Freq: Four times a day (QID) | ORAL | 0 refills | Status: DC | PRN

## 2015-09-22 MED ORDER — MORPHINE SULFATE (PF) 4 MG/ML IV SOLN
4.0000 mg | Freq: Once | INTRAVENOUS | Status: AC
  Administered 2015-09-22: 4 mg via INTRAVENOUS
  Filled 2015-09-22: qty 1

## 2015-09-22 MED ORDER — SODIUM CHLORIDE 0.9 % IV BOLUS (SEPSIS)
1000.0000 mL | Freq: Once | INTRAVENOUS | Status: AC
  Administered 2015-09-22: 1000 mL via INTRAVENOUS

## 2015-09-22 NOTE — ED Notes (Signed)
Unable to locate patient in lobby to update her on the wait.

## 2015-09-22 NOTE — ED Notes (Signed)
Pt reports she was dx with bacterial vaginosis and prescribed flagyl on 7/10.

## 2015-09-22 NOTE — ED Notes (Signed)
Pt taken to US

## 2015-09-22 NOTE — ED Notes (Signed)
MD McShane at bedside  

## 2015-09-22 NOTE — ED Provider Notes (Addendum)
[facility] Emergency Department Provider Note  ____________________________________________   I have reviewed the triage vital signs and the nursing notes.   HISTORY  Chief Complaint Dysuria    HPI Kathleen Owen is a 25 y.o. female with a history of ovarian cyst, uterine fibroids, Crohn's disease, recurrent abdominal pain, who is currently being treated since the 10th of this month for bacterial vaginosis with limited success in remembering to take the pills. Patient states last week, she has had left-sided lower abdominal discomfort which feels like menstrual period pain. She is somewhat late on her menstrual period. She believes she is approximately a week late. She believes is a like menstrual cramps however, she is not having any menstrual bleeding. She has not had any frank dysuria but sometimes it seems like the cramping is worse when she urinates. She denies any fever or chills. She does not believe this is a Crohn's flare. Patient states she has not had a Crohn's flare and 7 years, and her stool is completely normal. This is nothing like a Crohn's flare and her idea. She has had pain like this before but she cannot quite exactly remember what caused it. Patient denies any STI exposure she does have a ongoing bacterial vaginosis and therefore is an ongoing vaginal discharge. She states the vaginal discharge might be slightly worse in the last few days. She denies any sudden onset of discomfort, she cannot exactly remember when the lower pelvic pain started. She denies pregnancy although she has had multiple miscarriages in the past and she is concerned that she may be  having a miscarriage again.     Past Medical History:  Diagnosis Date  . 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 CD (Crohn's disease) ([place]) 07/30/2014  . Crohn's disease [facility]) Jan 2016  . Miscarriage 07/18/2014    Patient Active Problem List   Diagnosis Date Noted  . 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 Smoker 11/22/2014  . Recurrent vaginitis 11/22/2014  . 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 CD (Crohn's disease) ([place]) 07/30/2014  . Body mass index (BMI) of 27.0-27.9 in adult 10/03/2013    Past Surgical History:  Procedure Laterality Date  . TONSILLECTOMY      Current Outpatient Rx  . Order #: [id number] Class: Historical Med  . Order #: [id number] Class: Print  . Order #: [id number] Class: Print  . Order #: [id number] Class: Print  . Order #: [id number] Class: Print  . Order #: [id number] Class: Historical Med  . Order #: [id number] Class: Normal  . Order #: [id number] Class: Print  . Order #: [id number] Class: Print  . Order #: [id number] Class: Historical Med    Allergies Aspirin; Ibuprofen; and Amoxicillin  Family History  Problem Relation Age of Onset  . Kidney disease Maternal Uncle     Social History Social History  Substance Use Topics  . Smoking status: Current Every Day Smoker    Packs/day: 0.50    Types: Cigarettes  . Smokeless tobacco: Never Used  . Alcohol use Yes     Comment: occasionally    Review of Systems Constitutional: No fever/chills Eyes: No visual changes. ENT: No sore throat. No stiff neck no neck pain Cardiovascular: Denies chest pain. Respiratory: Denies shortness of breath. Gastrointestinal:   no vomiting.  No diarrhea.  No constipation. Genitourinary: See history of present illness. Musculoskeletal: Negative lower extremity swelling Skin: Negative for rash. Neurological: Negative for severe headaches, focal weakness or numbness. 10-point ROS otherwise negative.  ____________________________________________   PHYSICAL EXAM:  VITAL SIGNS: ED Triage Vitals  Enc Vitals Group     BP 09/21/15 2157 119/65     Pulse Rate 09/21/15 2157 (!) 102     Resp 09/21/15 2157 (!) 24     Temp 09/21/15 2157 98.1 F (36.7 C)     Temp Source 09/21/15 2157 Oral     SpO2  09/21/15 2157 100 %     Weight 09/21/15 2157 155 lb (70.3 kg)     Height 09/21/15 2157 5' 3"  (1.6 m)     Head Circumference --      Peak Flow --      Pain Score 09/21/15 2158 9     Pain Loc --      Pain Edu? --      Excl. in [place]? --     Constitutional: Alert and oriented. Well appearing and in no acute distress. Nose: No congestion/rhinnorhea. Mouth/Throat: Mucous membranes are moist.  Oropharynx non-erythematous. Cardiovascular: Normal rate, regular rhythm. Grossly normal heart sounds.  Good peripheral circulation. Respiratory: Normal respiratory effort.  No retractions. Lungs CTAB. Abdominal: Soft and Slight tenderness to palpation noted in the left suprapubic region nonsurgical abdomen No distention. No guarding no rebound Back:  There is no focal tenderness or step off.  there is no midline tenderness there are no lesions noted. there is no CVA tenderness Pelvic exam: Female nurse chaperone present, no external lesions noted, it is non-purulent vaginal discharge noted with no purulent discharge, there is moderate cervical motion tenderness, There is moderate left adnexal tenderness no mass, there is no significant uterine tenderness or mass. No vaginal bleeding Musculoskeletal: No lower extremity tenderness, no upper extremity tenderness. No joint effusions, no DVT signs strong distal pulses no edema Neurologic:  Normal speech and language. No gross focal neurologic deficits are appreciated.  Skin:  Skin is warm, dry and intact. No rash noted. Psychiatric: Mood and affect are normal. Speech and behavior are normal.  ____________________________________________   LABS (all labs ordered are listed, but only abnormal results are displayed)  Labs Reviewed  COMPREHENSIVE METABOLIC PANEL - Abnormal; Notable for the following:       Result Value   Potassium 3.3 (*)    Glucose, Bld 111 (*)    All other components within normal limits  CBC - Abnormal; Notable for the following:    WBC  17.2 (*)    All other components within normal limits  URINALYSIS COMPLETEWITH MICROSCOPIC ([facility] ONLY) - Abnormal; Notable for the following:    Color, Urine YELLOW (*)    APPearance HAZY (*)    Leukocytes, UA TRACE (*)    Bacteria, UA RARE (*)    Squamous Epithelial / LPF 6-30 (*)    All other components within normal limits  URINE CULTURE  CHLAMYDIA/NGC RT PCR ([facility] ONLY)  LIPASE, BLOOD  HCG, QUANTITATIVE, PREGNANCY  POC URINE PREG, ED  POCT PREGNANCY, URINE   ____________________________________________  EKG  I personally interpreted any EKGs ordered by me or triage  ____________________________________________  RADIOLOGY  I reviewed any imaging ordered by me or triage that were performed during my shift and, if possible, patient and/or family made aware of any abnormal findings. ____________________________________________   PROCEDURES  Procedure(s) performed: None  Procedures  Critical Care performed: None  ____________________________________________   INITIAL IMPRESSION / ASSESSMENT AND PLAN / ED COURSE  Pertinent labs & imaging results that were available during my care of the patient were reviewed by me and considered  in my medical decision making (see chart for details).  Patient with over a week of pelvic discomfort. White count moderately elevated otherwise unremarkable blood work. Does not appear to be urinalysis confirmed UTI but we will send urine culture. Given history of ovarian cysts and fibroids we will obtain an ultrasound and reassess.  ----------------------------------------- 5:11 AM on 09/22/2015 -----------------------------------------  Workup thus far reassuring patient does have a fairly large hemorrhagic cyst which I suspect is the cause of her symptoms. I did discuss with Anner Crete M.D. he states that he does not believe the patient has any evidence of torsion. He does see blood flow to the ovary and there is no  parenchymal edema or other secondary markers of torsion.   ----------------------------------------- 5:25 AM on 09/22/2015 -----------------------------------------  Patient has evidence of ongoing BV. I discussed with Dr. Kenton Kingfisher. He is comfortable signing the patient home with these findings on ultrasound doesn't he will follow-up. Patient does follow with [facility]. We will also see Dr. Kenton Kingfisher suggestion of a can try MetroGel to see if that helps the patient's BV as she is having trouble compliance with oral medication. We will send her home with pain medication and instructions to come back for any signs of torsion including increased pain or vomiting etc. the evidence of rupture with hemorrhage including increased pain or lightheadedness etc. and she will follow-up closely with her OB.  Clinical Course   ____________________________________________   FINAL CLINICAL IMPRESSION(S) / ED DIAGNOSES  Final diagnoses:  Pelvic pain affecting pregnancy      This chart was dictated using voice recognition software.  Despite best efforts to proofread,  errors can occur which can change meaning.      Schuyler Amor, MD 09/22/15 [place], MD 09/22/15 [place], MD 09/22/15 [phone]

## 2015-09-22 NOTE — ED Notes (Signed)
Pt c/o of lower left pelvic pain and pelvic pressure beginning last week. Pt denies problems with urinary flow/stream.

## 2015-09-23 LAB — URINE CULTURE

## 2016-01-31 LAB — HM PAP SMEAR: HM Pap smear: NEGATIVE

## 2016-04-07 ENCOUNTER — Emergency Department: Payer: Self-pay

## 2016-04-07 ENCOUNTER — Encounter: Payer: Self-pay | Admitting: Emergency Medicine

## 2016-04-07 ENCOUNTER — Emergency Department
Admission: EM | Admit: 2016-04-07 | Discharge: 2016-04-07 | Disposition: A | Discharge status: Home / Self Care | Payer: Self-pay | Attending: Emergency Medicine | Admitting: Emergency Medicine

## 2016-04-07 DIAGNOSIS — F17210 Nicotine dependence, cigarettes, uncomplicated: Secondary | ICD-10-CM | POA: Insufficient documentation

## 2016-04-07 DIAGNOSIS — R059 Cough, unspecified: Secondary | ICD-10-CM

## 2016-04-07 DIAGNOSIS — J40 Bronchitis, not specified as acute or chronic: Secondary | ICD-10-CM | POA: Insufficient documentation

## 2016-04-07 DIAGNOSIS — R05 Cough: Secondary | ICD-10-CM

## 2016-04-07 MED ORDER — PREDNISONE 10 MG PO TABS: 40.0000 mg | ORAL_TABLET | Freq: Every day | ORAL | 0 refills | Status: AC

## 2016-04-07 MED ORDER — ACETAMINOPHEN 325 MG PO TABS
650.0000 mg | ORAL_TABLET | Freq: Once | ORAL | Status: AC
  Administered 2016-04-07: 650 mg via ORAL
  Filled 2016-04-07: qty 2

## 2016-04-07 MED ORDER — AZITHROMYCIN 250 MG PO TABS: ORAL_TABLET | ORAL | 0 refills | Status: DC

## 2016-04-07 NOTE — ED Triage Notes (Signed)
Pt dx with the flu on Saturday and not any better today.

## 2016-04-07 NOTE — ED Provider Notes (Signed)
[facility] Emergency Department Provider Note  ____________________________________________  Time seen: Approximately 5:32 PM  I have reviewed the triage vital signs and the nursing notes.   HISTORY  Chief Complaint Influenza    HPI Kathleen Owen is a 26 y.o. female that presents to the emergency department with cough, body aches, congestion, fever since Wednesday. Patient states that she is coughing up brown sputum. Patient states that the coughing makes it difficult to breath during coughing spells. Patient states that symptoms weren't really bad for the first 2 days but symptoms got worse on Saturday. Patient states her fever went up to 103/104 but has not have a fever today.Patient has taken "everything over-the-counter." Patient denies chest pain, nausea, vomiting, abdominal pain, diarrhea, constipation.   Past Medical History:  Diagnosis Date  . 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 CD (Crohn's disease) ([place]) 07/30/2014  . Crohn's disease [facility]) Jan 2016  . Miscarriage 07/18/2014    Patient Active Problem List   Diagnosis Date Noted  . 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 Smoker 11/22/2014  . Recurrent vaginitis 11/22/2014  . 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 CD (Crohn's disease) ([place]) 07/30/2014  . Body mass index (BMI) of 27.0-27.9 in adult 10/03/2013    Past Surgical History:  Procedure Laterality Date  . TONSILLECTOMY      Prior to Admission medications   Medication Sig Start Date End Date Taking? Authorizing Provider  alum & mag hydroxide-simeth (MAALOX ADVANCED) 200-200-20 MG/5ML suspension Take 15 mLs by mouth every 6 (six) hours as needed for indigestion or heartburn. 04/15/15   Loney Hering, MD  azithromycin (ZITHROMAX Z-PAK) 250 MG tablet Take 2 tablets (500 mg) on  Day 1,  followed by 1 tablet (250 mg) once daily on Days 2 through 5. 04/07/16    Laban Emperor, PA-C  ciprofloxacin (CIPRO) 500 MG tablet Take 1 tablet (500 mg total) by mouth 2 (two) times daily. 07/26/15   Lisa Roca, MD  famotidine (PEPCID) 40 MG tablet Take 1 tablet (40 mg total) by mouth every evening. 04/15/15 04/14/16  Loney Hering, MD  HYDROcodone-acetaminophen (NORCO/VICODIN) 5-325 MG per tablet TK 1 T PO Q 4 TO 6 H  PRN P 10/12/14   Historical Provider, MD  mesalamine (PENTASA) 500 MG CR capsule Take 2 capsules (1,000 mg total) by mouth 4 (four) times daily. Patient not taking: Reported on 11/22/2014 10/25/14   Lucilla Lame, MD  metoCLOPramide (REGLAN) 10 MG tablet Take 1 tablet (10 mg total) by mouth every 8 (eight) hours as needed for nausea or vomiting. 04/15/15 04/14/16  Loney Hering, MD  metroNIDAZOLE (FLAGYL) 500 MG tablet TK 1 T PO Q  12 H 10/12/14   Historical Provider, MD  metroNIDAZOLE (METROGEL VAGINAL) 0.75 % vaginal gel Place 1 Applicatorful vaginally 2 (two) times daily. 09/22/15   Schuyler Amor, MD  ondansetron (ZOFRAN) 4 MG tablet Take 1 tablet (4 mg total) by mouth every 8 (eight) hours as needed for nausea or vomiting. 07/26/15   Lisa Roca, MD  oxyCODONE-acetaminophen (ROXICET) 5-325 MG tablet Take 1 tablet by mouth every 6 (six) hours as needed. 09/22/15   Schuyler Amor, MD  predniSONE (DELTASONE) 10 MG tablet Take 4 tablets (40 mg total) by mouth daily. 04/07/16 04/12/16  Laban Emperor, PA-C  Prenatal Vit-Fe Fumarate-FA (PRENATAL MULTIVITAMIN) TABS tablet Take 1 tablet by mouth daily at 12 noon.    Historical Provider,  MD    Allergies Aspirin; Ibuprofen; and Amoxicillin  Family History  Problem Relation Age of Onset  . Kidney disease Maternal Uncle     Social History Social History  Substance Use Topics  . Smoking status: Current Every Day Smoker    Packs/day: 0.50    Types: Cigarettes  . Smokeless tobacco: Never Used  . Alcohol use Yes     Comment: occasionally     Review of Systems  Eyes: No visual changes. No discharge. ENT:  Positive for congestion and rhinorrhea. Cardiovascular: No chest pain. Respiratory: Positive for cough.  Gastrointestinal: No abdominal pain.  No nausea, no vomiting.  No diarrhea.  No constipation. Musculoskeletal: Positive for musculoskeletal pain. Skin: Negative for rash, abrasions, lacerations, ecchymosis. Neurological: Negative for headaches.   ____________________________________________   PHYSICAL EXAM:  VITAL SIGNS: ED Triage Vitals  Enc Vitals Group     BP 04/07/16 1513 126/72     Pulse Rate 04/07/16 1513 (!) 102     Resp 04/07/16 1513 20     Temp 04/07/16 1513 98.5 F (36.9 C)     Temp Source 04/07/16 1513 Oral     SpO2 04/07/16 1513 98 %     Weight 04/07/16 1514 160 lb (72.6 kg)     Height 04/07/16 1514 5\' 3"  (1.6 m)     Head Circumference --      Peak Flow --      Pain Score 04/07/16 1514 8     Pain Loc --      Pain Edu? --      Excl. in [place]? --      Constitutional: Alert and oriented. Well appearing and in no acute distress. Eyes: Conjunctivae are normal. PERRL. EOMI. No discharge. Head: Atraumatic. ENT: No frontal and maxillary sinus tenderness.      Ears: Tympanic membranes pearly gray with good landmarks. No discharge.      Nose: Mild congestion/rhinnorhea.      Mouth/Throat: Mucous membranes are moist. Oropharynx non-erythematous. Tonsils not enlarged. No exudates. Uvula midline. Neck: No stridor.   Hematological/Lymphatic/Immunilogical: No cervical lymphadenopathy. Cardiovascular: Normal rate, regular rhythm. Good peripheral circulation. Respiratory: Normal respiratory effort without tachypnea or retractions. Lungs CTAB. Good air entry to the bases with no decreased or absent breath sounds. Gastrointestinal: Bowel sounds 4 quadrants. Soft and nontender to palpation. No guarding or rigidity. No palpable masses. No distention. Musculoskeletal: Full range of motion to all extremities. No gross deformities appreciated. Neurologic:  Normal speech and  language. No gross focal neurologic deficits are appreciated.  Skin:  Skin is warm, dry and intact. No rash noted. Psychiatric: Mood and affect are normal. Speech and behavior are normal. Patient exhibits appropriate insight and judgement.   ____________________________________________   LABS (all labs ordered are listed, but only abnormal results are displayed)  Labs Reviewed - No data to display ____________________________________________  EKG   ____________________________________________  RADIOLOGY Robinette Haines, personally viewed and evaluated these images (plain radiographs) as part of my medical decision making, as well as reviewing the written report by the radiologist.  Dg Chest 2 View  Result Date: 04/07/2016 CLINICAL DATA:  Acute onset of fever. Generalized chest and back pain when breathing. Blood-tinged mucus when coughing. Initial encounter. EXAM: CHEST  2 VIEW COMPARISON:  Chest radiograph from 01/01/2013 FINDINGS: The lungs are well-aerated and clear. There is no evidence of focal opacification, pleural effusion or pneumothorax. The heart is normal in size; the mediastinal contour is within normal limits. No acute osseous abnormalities  are seen. IMPRESSION: No acute cardiopulmonary process seen. Electronically Signed   By: Garald Balding M.D.   On: 04/07/2016 18:01    ____________________________________________    PROCEDURES  Procedure(s) performed:    Procedures    Medications  acetaminophen (TYLENOL) tablet 650 mg (650 mg Oral Given 04/07/16 1735)     ____________________________________________   INITIAL IMPRESSION / ASSESSMENT AND PLAN / ED COURSE  Pertinent labs & imaging results that were available during my care of the patient were reviewed by me and considered in my medical decision making (see chart for details).  Review of the [place] CSRS was performed in accordance of the [place] prior to dispensing any controlled drugs.     Patient's  diagnosis is consistent with bronchitis. Vital signs and exam are reassuring. Symptoms have been going on for 7 days. Chest x-ray negative for any acute cardiopulmonary processes. Patient appears well and is comfortable going home. Patient will be discharged home with prescriptions for azithromycin and prednisone. Patient is to follow up with PCP as needed or otherwise directed. Patient is given ED precautions to return to the ED for any worsening or new symptoms.     ____________________________________________  FINAL CLINICAL IMPRESSION(S) / ED DIAGNOSES  Final diagnoses:  Cough  Bronchitis      NEW MEDICATIONS STARTED DURING THIS VISIT:  Discharge Medication List as of 04/07/2016  6:51 PM    START taking these medications   Details  azithromycin (ZITHROMAX Z-PAK) 250 MG tablet Take 2 tablets (500 mg) on  Day 1,  followed by 1 tablet (250 mg) once daily on Days 2 through 5., Print    predniSONE (DELTASONE) 10 MG tablet Take 4 tablets (40 mg total) by mouth daily., Starting Tue 04/07/2016, Until Sun 04/12/2016, Print            This chart was dictated using voice recognition software/Dragon. Despite best efforts to proofread, errors can occur which can change the meaning. Any change was purely unintentional.    Laban Emperor, PA-C 04/07/16 2356    Harvest Dark, MD 04/13/16 2028

## 2016-04-07 NOTE — ED Notes (Signed)
Pt reports that she was dx with flu Saturday - she had a fever until yesterday of max 104 - since yesterday her entire back and chest hurt when she breaths and she reports blood tinged mucus from the coughing

## 2016-04-23 ENCOUNTER — Telehealth: Payer: Self-pay

## 2016-04-23 NOTE — Telephone Encounter (Signed)
Pt calling.  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  Wants to know if AMS ck'd her killer cells shen he ck'd her blood for diff things in the [place] office.  If he needs her to come in to discuss further that's fine.  Wants to know what all he did ck and what does she need to have ck'd to move forward.   She's had a couple miscarriages since she saw him last.  Also wants to know what Md/infertility clinic did AMS send her to.

## 2016-04-23 NOTE — Telephone Encounter (Signed)
Please schedule her to come in for a conference and possible labwork

## 2016-04-27 NOTE — Telephone Encounter (Signed)
Pt is calling back and would like Dr. Georgianne Fick to call back to answer some questions she has please. Cb# [phone]

## 2016-04-30 NOTE — Telephone Encounter (Signed)
I called pt and left voicemail for pt to call back to be schedule with Dr. Georgianne Fick for an Consult.

## 2016-05-07 NOTE — Telephone Encounter (Signed)
Called and Lvm for pt to call back to Follow up with Dr. Georgianne Fick.

## 2016-05-26 NOTE — Telephone Encounter (Signed)
Pt called and doesn't want to pay for appt at this time due to not having insurance pt will call back to be schedule

## 2016-08-27 ENCOUNTER — Emergency Department
Admission: EM | Admit: 2016-08-27 | Discharge: 2016-08-27 | Disposition: A | Discharge status: Home / Self Care | Payer: Self-pay | Attending: Emergency Medicine | Admitting: Emergency Medicine

## 2016-08-27 ENCOUNTER — Emergency Department: Payer: Self-pay

## 2016-08-27 ENCOUNTER — Encounter: Payer: Self-pay | Admitting: Emergency Medicine

## 2016-08-27 DIAGNOSIS — D72829 Elevated white blood cell count, unspecified: Secondary | ICD-10-CM

## 2016-08-27 DIAGNOSIS — Z79899 Other long term (current) drug therapy: Secondary | ICD-10-CM | POA: Insufficient documentation

## 2016-08-27 DIAGNOSIS — F17210 Nicotine dependence, cigarettes, uncomplicated: Secondary | ICD-10-CM | POA: Insufficient documentation

## 2016-08-27 DIAGNOSIS — R079 Chest pain, unspecified: Secondary | ICD-10-CM

## 2016-08-27 LAB — CBC
HCT: 42.1 % (ref 35.0–47.0)
Hemoglobin: 14.2 g/dL (ref 12.0–16.0)
MCH: 29 pg (ref 26.0–34.0)
MCHC: 33.7 g/dL (ref 32.0–36.0)
MCV: 86.1 fL (ref 80.0–100.0)
Platelets: 265 10*3/uL (ref 150–440)
RBC: 4.89 MIL/uL (ref 3.80–5.20)
RDW: 13.8 % (ref 11.5–14.5)
WBC: 28.5 10*3/uL — ABNORMAL HIGH (ref 3.6–11.0)

## 2016-08-27 LAB — BASIC METABOLIC PANEL
Anion gap: 10 (ref 5–15)
BUN: 10 mg/dL (ref 6–20)
CHLORIDE: 104 mmol/L (ref 101–111)
CO2: 24 mmol/L (ref 22–32)
Calcium: 9.6 mg/dL (ref 8.9–10.3)
Creatinine, Ser: 0.68 mg/dL (ref 0.44–1.00)
GFR calc Af Amer: >60 mL/min (ref >60)
GLUCOSE: 111 mg/dL — AB (ref 65–99)
POTASSIUM: 3.5 mmol/L (ref 3.5–5.1)
Sodium: 138 mmol/L (ref 135–145)

## 2016-08-27 LAB — TROPONIN I: Troponin I: <0.03 ng/mL (ref <0.03)

## 2016-08-27 MED ORDER — ONDANSETRON HCL 4 MG/2ML IJ SOLN
4.0000 mg | Freq: Once | INTRAMUSCULAR | Status: AC
  Administered 2016-08-27: 4 mg via INTRAVENOUS
  Filled 2016-08-27: qty 2

## 2016-08-27 MED ORDER — MORPHINE SULFATE (PF) 2 MG/ML IV SOLN
2.0000 mg | Freq: Once | INTRAVENOUS | Status: AC
  Administered 2016-08-27: 2 mg via INTRAVENOUS
  Filled 2016-08-27: qty 1

## 2016-08-27 MED ORDER — IOPAMIDOL (ISOVUE-370) INJECTION 76%
75.0000 mL | Freq: Once | INTRAVENOUS | Status: AC | PRN
  Administered 2016-08-27: 75 mL via INTRAVENOUS

## 2016-08-27 MED ORDER — MORPHINE SULFATE (PF) 4 MG/ML IV SOLN
INTRAVENOUS | Status: AC
  Administered 2016-08-27: 4 mg via INTRAVENOUS
  Filled 2016-08-27: qty 1

## 2016-08-27 MED ORDER — ALBUTEROL SULFATE HFA 108 (90 BASE) MCG/ACT IN AERS: 2.0000 | INHALATION_SPRAY | Freq: Four times a day (QID) | RESPIRATORY_TRACT | 0 refills | Status: DC | PRN

## 2016-08-27 MED ORDER — MORPHINE SULFATE (PF) 4 MG/ML IV SOLN
4.0000 mg | Freq: Once | INTRAVENOUS | Status: AC
  Administered 2016-08-27: 4 mg via INTRAVENOUS

## 2016-08-27 NOTE — ED Triage Notes (Signed)
Pt ambulatory to triage with steady gait, coughing. Pt reports she woke this AM at approximately 0400 with vigorous coughing spell, following she has had right sided chest pain with SOB. Pt taken to RM 8 with MD at bedside.

## 2016-08-27 NOTE — ED Provider Notes (Signed)
[facility] Emergency Department Provider Note    First MD Initiated Contact with Patient 08/27/16 [phone]     (approximate)  I have reviewed the triage vital signs and the nursing notes.   HISTORY  Chief Complaint Chest Pain    HPI Kathleen Owen is a 26 y.o. female with below list of chronic medical conditions including Crohn's disease (not receiving any treatment at this time) presents to the emergency department with acute onset of right-sided chest pain which began after a violent coughing spell on awakening this morning. Patient states her current pain score is 9 out of 10. Patient states the pain is worse with movement and deep inspiration. Patient denies any lower extremity pain swelling. Patient denies any history of DVT or PE.   Past Medical History:  Diagnosis Date  . 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 CD (Crohn's disease) ([place]) 07/30/2014  . Crohn's disease [facility]) Jan 2016  . Miscarriage 07/18/2014    Patient Active Problem List   Diagnosis Date Noted  . 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 Smoker 11/22/2014  . Recurrent vaginitis 11/22/2014  . 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 CD (Crohn's disease) ([place]) 07/30/2014  . Body mass index (BMI) of 27.0-27.9 in adult 10/03/2013    Past Surgical History:  Procedure Laterality Date  . TONSILLECTOMY      Prior to Admission medications   Medication Sig Start Date End Date Taking? Authorizing Provider  alum & mag hydroxide-simeth (MAALOX ADVANCED) 200-200-20 MG/5ML suspension Take 15 mLs by mouth every 6 (six) hours as needed for indigestion or heartburn. 04/15/15   Loney Hering, MD  azithromycin (ZITHROMAX Z-PAK) 250 MG tablet Take 2 tablets (500 mg) on  Day 1,  followed by 1 tablet (250 mg) once daily on Days 2 through 5. 04/07/16   Laban Emperor, PA-C  ciprofloxacin (CIPRO) 500 MG tablet Take 1 tablet (500 mg  total) by mouth 2 (two) times daily. 07/26/15   Lisa Roca, MD  famotidine (PEPCID) 40 MG tablet Take 1 tablet (40 mg total) by mouth every evening. 04/15/15 04/14/16  Loney Hering, MD  HYDROcodone-acetaminophen (NORCO/VICODIN) 5-325 MG per tablet TK 1 T PO Q 4 TO 6 H  PRN P 10/12/14   [provider]  mesalamine (PENTASA) 500 MG CR capsule Take 2 capsules (1,000 mg total) by mouth 4 (four) times daily. Patient not taking: Reported on 11/22/2014 10/25/14   Lucilla Lame, MD  metoCLOPramide (REGLAN) 10 MG tablet Take 1 tablet (10 mg total) by mouth every 8 (eight) hours as needed for nausea or vomiting. 04/15/15 04/14/16  Loney Hering, MD  metroNIDAZOLE (FLAGYL) 500 MG tablet TK 1 T PO Q  12 H 10/12/14   [provider]  metroNIDAZOLE (METROGEL VAGINAL) 0.75 % vaginal gel Place 1 Applicatorful vaginally 2 (two) times daily. 09/22/15   Schuyler Amor, MD  ondansetron (ZOFRAN) 4 MG tablet Take 1 tablet (4 mg total) by mouth every 8 (eight) hours as needed for nausea or vomiting. 07/26/15   Lisa Roca, MD  oxyCODONE-acetaminophen (ROXICET) 5-325 MG tablet Take 1 tablet by mouth every 6 (six) hours as needed. 09/22/15   Schuyler Amor, MD  Prenatal Vit-Fe Fumarate-FA (PRENATAL MULTIVITAMIN) TABS tablet Take 1 tablet by mouth daily at 12 noon.    [provider]    Allergies Aspirin; Ibuprofen; and Amoxicillin  Family History  Problem Relation Age of  Onset  . Kidney disease Maternal Uncle     Social History Social History  Substance Use Topics  . Smoking status: Current Every Day Smoker    Packs/day: 0.50    Types: Cigarettes  . Smokeless tobacco: Never Used  . Alcohol use Yes     Comment: occasionally    Review of Systems Constitutional: No fever/chills Eyes: No visual changes. ENT: No sore throat. Cardiovascular: Positive for chest pain. Respiratory: Denies shortness of breath. Gastrointestinal: No abdominal pain.  No nausea, no vomiting.  No  diarrhea.  No constipation. Genitourinary: Negative for dysuria. Musculoskeletal: Negative for neck pain.  Negative for back pain. Integumentary: Negative for rash. Neurological: Negative for headaches, focal weakness or numbness.   ____________________________________________   PHYSICAL EXAM:  VITAL SIGNS: ED Triage Vitals  Enc Vitals Group     BP 08/27/16 0624 120/71     Pulse Rate 08/27/16 0624 92     Resp 08/27/16 0624 20     Temp 08/27/16 0624 98 F (36.7 C)     Temp Source 08/27/16 0624 Oral     SpO2 08/27/16 0624 97 %     Weight 08/27/16 0621 72.6 kg (160 lb)     Height --      Head Circumference --      Peak Flow --      Pain Score 08/27/16 0628 8     Pain Loc --      Pain Edu? --      Excl. in [place]? --     Constitutional: Alert and oriented. Well appearing and in no acute distress. Eyes: Conjunctivae are normal.  Head: Atraumatic. Mouth/Throat: Mucous membranes are moist. Oropharynx non-erythematous. Neck: No stridor.   Cardiovascular: Normal rate, regular rhythm. Good peripheral circulation. Grossly normal heart sounds.Pain with gentle palpation of the right parasternal border. Respiratory: Normal respiratory effort.  No retractions. Lungs CTAB. Gastrointestinal: Soft and nontender. No distention.  Musculoskeletal: No lower extremity tenderness nor edema. No gross deformities of extremities.Pain with gentle palpation of the right parasternal border. Neurologic:  Normal speech and language. No gross focal neurologic deficits are appreciated.  Skin:  Skin is warm, dry and intact. No rash noted. Psychiatric: Mood and affect are normal. Speech and behavior are normal.  ____________________________________________   LABS (all labs ordered are listed, but only abnormal results are displayed)  Labs Reviewed  BASIC METABOLIC PANEL - Abnormal; Notable for the following:       Result Value   Glucose, Bld 111 (*)    All other components within normal limits  CBC -  Abnormal; Notable for the following:    WBC 28.5 (*)    All other components within normal limits  TROPONIN I  PREGNANCY, URINE   ____________________________________________  EKG  ED ECG REPORT I, [place] N BROWN, the attending physician, personally viewed and interpreted this ECG.   Date: 08/27/2016  EKG Time: 6:24 AM  Rate: 94  Rhythm: Normal sinus rhythm  Axis: Normal  Intervals: Normal  ST&T Change: None  ____________________________________________  RADIOLOGY I, Neligh N BROWN, personally viewed and evaluated these images (plain radiographs) as part of my medical decision making, as well as reviewing the written report by the radiologist.  Dg Chest 2 View  Result Date: 08/27/2016 CLINICAL DATA:  Acute onset of cough, right-sided chest pain and shortness of breath. Initial encounter. EXAM: CHEST  2 VIEW COMPARISON:  Chest radiograph performed 04/07/2016 FINDINGS: The lungs are well-aerated and clear. There is no evidence of focal  opacification, pleural effusion or pneumothorax. The heart is normal in size; the mediastinal contour is within normal limits. No acute osseous abnormalities are seen. IMPRESSION: No acute cardiopulmonary process seen. Electronically Signed   By: Garald Balding M.D.   On: 08/27/2016 06:51      Procedures   ____________________________________________   INITIAL IMPRESSION / ASSESSMENT AND PLAN / ED COURSE  Pertinent labs & imaging results that were available during my care of the patient were reviewed by me and considered in my medical decision making (see chart for details).  Patient given morphine 2 mg with no improvement of pain as such patient given 4 mg of IV morphine. Considered possibly of a pneumothorax given acute onset of chest pain and dyspnea however chest x-ray revealed no evidence of a pneumothorax. Also consider possibility of a pulmonary emboli and a such CT scan of chest ordered. Given the reproducible nature of the pain  considered possible costochondritis as etiology for the patient's pain. A CT scan of the chest revealed no acute abnormality I anticipate the patient being able to be discharged home with outpatient follow-up. I informed the patient of the fact that she's had a persistent leukocytosis and the necessity of following up with primary care physician for further outpatient evaluation regarding this as well. Patient's care transferred to Dr. Dineen Kid      ____________________________________________  FINAL CLINICAL IMPRESSION(S) / ED DIAGNOSES  Final diagnoses:  Leukocytosis, unspecified type  Chest pain, unspecified type     MEDICATIONS GIVEN DURING THIS VISIT:  Medications  morphine 2 MG/ML injection 2 mg (2 mg Intravenous Given 08/27/16 0644)  ondansetron (ZOFRAN) injection 4 mg (4 mg Intravenous Given 08/27/16 0644)     NEW OUTPATIENT MEDICATIONS STARTED DURING THIS VISIT:  New Prescriptions   No medications on file    Modified Medications   No medications on file    Discontinued Medications   No medications on file     Note:  This document was prepared using Dragon voice recognition software and may include unintentional dictation errors.    Gregor Hams, MD 08/27/16 [phone]

## 2016-08-27 NOTE — ED Provider Notes (Signed)
26 year old female with a history of Crohn's disease was presenting after waking up a coughing fit with right-sided chest pain this morning. Given morphine by Dr. Owens Shark. Plan to follow up with the CT angiography of the chest. Pain has been reproducible. Possible chest wall pain.   Physical Exam  BP 120/71 (BP Location: Right Arm)   Pulse 92   Temp 98 F (36.7 C) (Oral)   Resp 20   Wt 72.6 kg (160 lb)   SpO2 97%   BMI 28.34 kg/m  ----------------------------------------- 8:09 AM on 08/27/2016 -----------------------------------------   Physical Exam Patient without any respiratory distress. Clear to auscultation to the bilateral lung fields. No cough 11 the room. Patient resting in the bed without any active signs of pain. ED Course  Procedures  MDM No acute findings on the chest CT. Unclear cause of the coughing. Patient thinks she may have had an asthma attack but the symptoms seem to have resolved spontaneously without inhaler steroid use. I will discharge the patient home with an inhaler. She is aware of her elevated white blood cell count will be able to follow-up as an outpatient. We discussed the CAT scan findings. The patient says that she has used icy hot and other muscle sounds in the past without any adverse reaction. I recommended these for use at home. Patient will also be discharged with a prescription for an inhaler she says that she does not have one at home. Patient says that her symptoms have improved and she is not having any difficulty breathing now but still has just a mild soreness after morphine. I believe at this point she is appropriate for discharge. The patient understands plan and is willing to comply.       Orbie Pyo, MD 08/27/16 [phone]

## 2016-10-22 ENCOUNTER — Ambulatory Visit (INDEPENDENT_AMBULATORY_CARE_PROVIDER_SITE_OTHER): Payer: Medicaid Other | Admitting: Certified Nurse Midwife

## 2016-10-22 ENCOUNTER — Encounter: Payer: Self-pay | Admitting: Certified Nurse Midwife

## 2016-10-22 VITALS — BP 99/70 | HR 83 | Ht 63.0 in | Wt 165.2 lb

## 2016-10-22 DIAGNOSIS — O2620 Pregnancy care for patient with recurrent pregnancy loss, unspecified trimester: Secondary | ICD-10-CM

## 2016-10-22 DIAGNOSIS — N926 Irregular menstruation, unspecified: Secondary | ICD-10-CM | POA: Diagnosis not present

## 2016-10-22 DIAGNOSIS — Z87440 Personal history of urinary (tract) infections: Secondary | ICD-10-CM

## 2016-10-22 DIAGNOSIS — M549 Dorsalgia, unspecified: Secondary | ICD-10-CM | POA: Diagnosis not present

## 2016-10-22 LAB — POCT URINALYSIS DIPSTICK
Bilirubin, UA: NEGATIVE
Glucose, UA: NEGATIVE
KETONES UA: NEGATIVE
Leukocytes, UA: NEGATIVE
Nitrite, UA: NEGATIVE
PH UA: 7 (ref 5.0–8.0)
PROTEIN UA: NEGATIVE
RBC UA: NEGATIVE
Spec Grav, UA: 1.025 (ref 1.010–1.025)
UROBILINOGEN UA: 0.2 U/dL

## 2016-10-22 LAB — POCT URINE PREGNANCY: Preg Test, Ur: POSITIVE — AB

## 2016-10-22 MED ORDER — PROGESTERONE MICRONIZED 200 MG PO CAPS: ORAL_CAPSULE | ORAL | 3 refills | Status: DC

## 2016-10-22 NOTE — Patient Instructions (Signed)
First Trimester of Pregnancy The first trimester of pregnancy is from week 1 until the end of week 13 (months 1 through 3). A week after a sperm fertilizes an egg, the egg will implant on the wall of the uterus. This embryo will begin to develop into a baby. Genes from you and your partner will form the baby. The female genes will determine whether the baby will be a boy or a girl. At 6-8 weeks, the eyes and face will be formed, and the heartbeat can be seen on ultrasound. At the end of 12 weeks, all the baby's organs will be formed. Now that you are pregnant, you will want to do everything you can to have a healthy baby. Two of the most important things are to get good prenatal care and to follow your health care provider's instructions. Prenatal care is all the medical care you receive before the baby's birth. This care will help prevent, find, and treat any problems during the pregnancy and childbirth. Body changes during your first trimester Your body goes through many changes during pregnancy. The changes vary from woman to woman.  You may gain or lose a couple of pounds at first.  You may feel sick to your stomach (nauseous) and you may throw up (vomit). If the vomiting is uncontrollable, call your health care provider.  You may tire easily.  You may develop headaches that can be relieved by medicines. All medicines should be approved by your health care provider.  You may urinate more often. Painful urination may mean you have a bladder infection.  You may develop heartburn as a result of your pregnancy.  You may develop constipation because certain hormones are causing the muscles that push stool through your intestines to slow down.  You may develop hemorrhoids or swollen veins (varicose veins).  Your breasts may begin to grow larger and become tender. Your nipples may stick out more, and the tissue that surrounds them (areola) may become darker.  Your gums may bleed and may be  sensitive to brushing and flossing.  Dark spots or blotches (chloasma, mask of pregnancy) may develop on your face. This will likely fade after the baby is born.  Your menstrual periods will stop.  You may have a loss of appetite.  You may develop cravings for certain kinds of food.  You may have changes in your emotions from day to day, such as being excited to be pregnant or being concerned that something may go wrong with the pregnancy and baby.  You may have more vivid and strange dreams.  You may have changes in your hair. These can include thickening of your hair, rapid growth, and changes in texture. Some women also have hair loss during or after pregnancy, or hair that feels dry or thin. Your hair will most likely return to normal after your baby is born.  What to expect at prenatal visits During a routine prenatal visit:  You will be weighed to make sure you and the baby are growing normally.  Your blood pressure will be taken.  Your abdomen will be measured to track your baby's growth.  The fetal heartbeat will be listened to between weeks 10 and 14 of your pregnancy.  Test results from any previous visits will be discussed.  Your health care provider may ask you:  How you are feeling.  If you are feeling the baby move.  If you have had any abnormal symptoms, such as leaking fluid, bleeding, severe headaches,  or abdominal cramping.  If you are using any tobacco products, including cigarettes, chewing tobacco, and electronic cigarettes.  If you have any questions.  Other tests that may be performed during your first trimester include:  Blood tests to find your blood type and to check for the presence of any previous infections. The tests will also be used to check for low iron levels (anemia) and protein on red blood cells (Rh antibodies). Depending on your risk factors, or if you previously had diabetes during pregnancy, you may have tests to check for high blood  sugar that affects pregnant women (gestational diabetes).  Urine tests to check for infections, diabetes, or protein in the urine.  An ultrasound to confirm the proper growth and development of the baby.  Fetal screens for spinal cord problems (spina bifida) and Down syndrome.  HIV (human immunodeficiency virus) testing. Routine prenatal testing includes screening for HIV, unless you choose not to have this test.  You may need other tests to make sure you and the baby are doing well.  Follow these instructions at home: Medicines  Follow your health care provider's instructions regarding medicine use. Specific medicines may be either safe or unsafe to take during pregnancy.  Take a prenatal vitamin that contains at least 600 micrograms (mcg) of folic acid.  If you develop constipation, try taking a stool softener if your health care provider approves. Eating and drinking  Eat a balanced diet that includes fresh fruits and vegetables, whole grains, good sources of protein such as meat, eggs, or tofu, and low-fat dairy. Your health care provider will help you determine the amount of weight gain that is right for you.  Avoid raw meat and uncooked cheese. These carry germs that can cause birth defects in the baby.  Eating four or five small meals rather than three large meals a day may help relieve nausea and vomiting. If you start to feel nauseous, eating a few soda crackers can be helpful. Drinking liquids between meals, instead of during meals, also seems to help ease nausea and vomiting.  Limit foods that are high in fat and processed sugars, such as fried and sweet foods.  To prevent constipation: ? Eat foods that are high in fiber, such as fresh fruits and vegetables, whole grains, and beans. ? Drink enough fluid to keep your urine clear or pale yellow. Activity  Exercise only as directed by your health care provider. Most women can continue their usual exercise routine during  pregnancy. Try to exercise for 30 minutes at least 5 days a week. Exercising will help you: ? Control your weight. ? Stay in shape. ? Be prepared for labor and delivery.  Experiencing pain or cramping in the lower abdomen or lower back is a good sign that you should stop exercising. Check with your health care provider before continuing with normal exercises.  Try to avoid standing for long periods of time. Move your legs often if you must stand in one place for a long time.  Avoid heavy lifting.  Wear low-heeled shoes and practice good posture.  You may continue to have sex unless your health care provider tells you not to. Relieving pain and discomfort  Wear a good support bra to relieve breast tenderness.  Take warm sitz baths to soothe any pain or discomfort caused by hemorrhoids. Use hemorrhoid cream if your health care provider approves.  Rest with your legs elevated if you have leg cramps or low back pain.  If you develop  varicose veins in your legs, wear support hose. Elevate your feet for 15 minutes, 3-4 times a day. Limit salt in your diet. Prenatal care  Schedule your prenatal visits by the twelfth week of pregnancy. They are usually scheduled monthly at first, then more often in the last 2 months before delivery.  Write down your questions. Take them to your prenatal visits.  Keep all your prenatal visits as told by your health care provider. This is important. Safety  Wear your seat belt at all times when driving.  Make a list of emergency phone numbers, including numbers for family, friends, the hospital, and police and fire departments. General instructions  Ask your health care provider for a referral to a local prenatal education class. Begin classes no later than the beginning of month 6 of your pregnancy.  Ask for help if you have counseling or nutritional needs during pregnancy. Your health care provider can offer advice or refer you to specialists for help  with various needs.  Do not use hot tubs, steam rooms, or saunas.  Do not douche or use tampons or scented sanitary pads.  Do not cross your legs for long periods of time.  Avoid cat litter boxes and soil used by cats. These carry germs that can cause birth defects in the baby and possibly loss of the fetus by miscarriage or stillbirth.  Avoid all smoking, herbs, alcohol, and medicines not prescribed by your health care provider. Chemicals in these products affect the formation and growth of the baby.  Do not use any products that contain nicotine or tobacco, such as cigarettes and e-cigarettes. If you need help quitting, ask your health care provider. You may receive counseling support and other resources to help you quit.  Schedule a dentist appointment. At home, brush your teeth with a soft toothbrush and be gentle when you floss. Contact a health care provider if:  You have dizziness.  You have mild pelvic cramps, pelvic pressure, or nagging pain in the abdominal area.  You have persistent nausea, vomiting, or diarrhea.  You have a bad smelling vaginal discharge.  You have pain when you urinate.  You notice increased swelling in your face, hands, legs, or ankles.  You are exposed to fifth disease or chickenpox.  You are exposed to [place] measles (rubella) and have never had it. Get help right away if:  You have a fever.  You are leaking fluid from your vagina.  You have spotting or bleeding from your vagina.  You have severe abdominal cramping or pain.  You have rapid weight gain or loss.  You vomit blood or material that looks like coffee grounds.  You develop a severe headache.  You have shortness of breath.  You have any kind of trauma, such as from a fall or a car accident. Summary  The first trimester of pregnancy is from week 1 until the end of week 13 (months 1 through 3).  Your body goes through many changes during pregnancy. The changes vary from  woman to woman.  You will have routine prenatal visits. During those visits, your health care provider will examine you, discuss any test results you may have, and talk with you about how you are feeling. This information is not intended to replace advice given to you by your health care provider. Make sure you discuss any questions you have with your health care provider. Document Released: 02/03/2001 Document Revised: 01/22/2016 Document Reviewed: 01/22/2016 Elsevier Interactive Patient Education  2017 [employer]

## 2016-10-24 LAB — URINE CULTURE, OB REFLEX

## 2016-10-24 LAB — CULTURE, OB URINE

## 2016-10-28 ENCOUNTER — Telehealth: Payer: Self-pay | Admitting: Certified Nurse Midwife

## 2016-10-28 NOTE — Telephone Encounter (Signed)
Patient called requesting lab results from 8/30. Thanks

## 2016-10-29 ENCOUNTER — Ambulatory Visit (INDEPENDENT_AMBULATORY_CARE_PROVIDER_SITE_OTHER): Payer: Self-pay

## 2016-10-29 DIAGNOSIS — O2620 Pregnancy care for patient with recurrent pregnancy loss, unspecified trimester: Secondary | ICD-10-CM

## 2016-10-29 DIAGNOSIS — N926 Irregular menstruation, unspecified: Secondary | ICD-10-CM

## 2016-10-29 LAB — CMV ANTIBODY, IGG (EIA)

## 2016-10-29 LAB — PT AND PTT
INR: 1 (ref 0.8–1.2)
Prothrombin Time: 10.2 s (ref 9.1–12.0)
aPTT: 28 s (ref 24–33)

## 2016-10-29 LAB — GLIADIN ANTIBODIES, SERUM
Antigliadin Abs, IgA: 4 units (ref 0–19)
Gliadin IgG: 3 units (ref 0–19)

## 2016-10-29 LAB — FACTOR 5 LEIDEN

## 2016-10-29 LAB — HSV 1 ANTIBODY, IGG: HSV 1 Glycoprotein G Ab, IgG: 24.1 index — ABNORMAL HIGH (ref 0.00–0.90)

## 2016-10-29 LAB — RUBELLA SCREEN: RUBELLA: 3.87 {index} (ref >0.99)

## 2016-10-29 LAB — TOXOPLASMA GONDII ANTIBODY, IGG

## 2016-10-29 LAB — RPR: RPR Ser Ql: NONREACTIVE

## 2016-10-29 LAB — BETA HCG QUANT (REF LAB): hCG Quant: 1035 m[IU]/mL

## 2016-10-29 LAB — TISSUE TRANSGLUTAMINASE, IGA

## 2016-10-29 LAB — HSV 2 ANTIBODY, IGG

## 2016-10-29 LAB — RETICULIN ANTIBODIES, IGA W TITER: Reticulin Ab, IgA: NEGATIVE titer (ref <2.5)

## 2016-10-29 LAB — PROGESTERONE: Progesterone: 15.5 ng/mL

## 2016-11-01 NOTE — Progress Notes (Signed)
GYN ENCOUNTER NOTE  Subjective:       Kathleen Owen is a 26 y.o. G53P0070 female here for pregnancy confirmation.   Reports missed menses and positive home pregnancy test. Endorses breast tenderness and GERD.   Also reports intermittent lower right back pain for the last three (3) days.     History significant for recurrent pregnancy loss, irregular menses, and Chron's disease.   Denies difficulty breathing or respiratory distress, chest pain, abdominal pain, vaginal bleeding, dysuria, and leg pain or swelling.    Gynecologic History  Patient's last menstrual period was 09/15/2016 (exact date).   Gestational age: 72 weeks 2 days.   Estimated date of birth: 06/22/2018.   Obstetric History  OB History  Gravida Para Term Preterm AB Living  8       7    SAB TAB Ectopic Multiple Live Births  5 1          # Outcome Date GA Lbr Len/2nd Weight Sex Delivery Anes PTL Lv  8 Current           7 SAB 07/22/14          6 SAB 04/23/14          5 SAB 03/23/14          4 SAB 03/23/13          3 TAB 07/21/08          2 AB 07/22/06     TAB     1 SAB         ND      Past Medical History:  Diagnosis Date  . 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 CD (Crohn's disease) ([place]) 07/30/2014  . Crohn's disease [facility]) Jan 2016  . Miscarriage 07/18/2014    Past Surgical History:  Procedure Laterality Date  . TONSILLECTOMY      Current Outpatient Prescriptions on File Prior to Visit  Medication Sig Dispense Refill  . albuterol (PROVENTIL HFA;VENTOLIN HFA) 108 (90 Base) MCG/ACT inhaler Inhale 2 puffs into the lungs every 6 (six) hours as needed. 1 Inhaler 0  . alum & mag hydroxide-simeth (MAALOX ADVANCED) 200-200-20 MG/5ML suspension Take 15 mLs by mouth every 6 (six) hours as needed for indigestion or heartburn. (Patient not taking: Reported on 08/27/2016) 150 mL 0  . famotidine (PEPCID) 40 MG tablet Take 1 tablet (40 mg total) by mouth every  evening. (Patient not taking: Reported on 08/27/2016) 15 tablet 0  . mesalamine (PENTASA) 500 MG CR capsule Take 2 capsules (1,000 mg total) by mouth 4 (four) times daily. (Patient not taking: Reported on 11/22/2014) 240 capsule 3  . metoCLOPramide (REGLAN) 10 MG tablet Take 1 tablet (10 mg total) by mouth every 8 (eight) hours as needed for nausea or vomiting. (Patient not taking: Reported on 08/27/2016) 20 tablet 0  . ondansetron (ZOFRAN) 4 MG tablet Take 1 tablet (4 mg total) by mouth every 8 (eight) hours as needed for nausea or vomiting. (Patient not taking: Reported on 08/27/2016) 10 tablet 0  . oxyCODONE-acetaminophen (ROXICET) 5-325 MG tablet Take 1 tablet by mouth every 6 (six) hours as needed. (Patient not taking: Reported on 08/27/2016) 12 tablet 0  . Prenatal Vit-Fe Fumarate-FA (PRENATAL MULTIVITAMIN) TABS tablet Take 1 tablet by mouth daily at 12 noon.     No current facility-administered medications on file prior to visit.     Allergies  Allergen Reactions  . Aspirin  Chron's Disease  . Ibuprofen     Chron's Disease  . Amoxicillin Rash    Social History   Social History  . Marital status: Single    Spouse name: N/A  . Number of children: N/A  . Years of education: N/A   Occupational History  . Not on file.   Social History Main Topics  . Smoking status: Current Every Day Smoker    Packs/day: 0.50    Types: Cigarettes  . Smokeless tobacco: Never Used  . Alcohol use Yes     Comment: occasionally  . Drug use: No  . Sexual activity: Yes    Birth control/ protection: None   Other Topics Concern  . Not on file   Social History Narrative  . No narrative on file    Family History  Problem Relation Age of Onset  . Kidney disease Maternal Uncle   . Heart failure Maternal Uncle   . Heart failure Maternal Grandmother   . Hypertension Paternal Grandmother   . Thyroid disease Paternal Aunt     The following portions of the patient's history were reviewed and updated  as appropriate: allergies, current medications, past family history, past medical history, past social history, past surgical history and problem list.  Review of Systems  Review of Systems - Negative except as noted above.  History obtained from the patient.   Objective:   BP 99/70   Pulse 83   Ht 5' 3"  (1.6 m)   Wt 165 lb 3.2 oz (74.9 kg)   LMP 09/15/2016 (Exact Date)   BMI 29.26 kg/m   Alert and oriented x 4, no apparent distress.   Positive UPT  Physical exam: not indicated.   Assessment:   1. Missed menses  - POCT urine pregnancy - POCT urinalysis dipstick - Progesterone - Gliadin antibodies, serum - Tissue transglutaminase, IgA - Reticulin Antibody, IgA w reflex titer - RPR - Rubella screen - Toxoplasma gondii antibody, IgG - HSV 1 antibody, IgG - HSV 2 antibody, IgG - Cmv antibody, IgG (EIA) - PT and PTT - INR/PT - Beta HCG, Quant - Factor 5 leiden - US OB Transvaginal; Future  2. Right-sided back pain, unspecified back location, unspecified chronicity  - POCT urinalysis dipstick - Culture, OB Urine  3. Recurrent pregnancy loss with current pregnancy  - Progesterone - Gliadin antibodies, serum - Tissue transglutaminase, IgA - Reticulin Antibody, IgA w reflex titer - RPR - Rubella screen - Toxoplasma gondii antibody, IgG - HSV 1 antibody, IgG - HSV 2 antibody, IgG - Cmv antibody, IgG (EIA) - PT and PTT - INR/PT - Beta HCG, Quant - Factor 5 leiden - US OB Transvaginal; Future  4. Irregular menses  - US OB Transvaginal; Future  5. History of recurrent UTIs  - Culture, OB Urine   Plan:   Encouraged PNV with folic acid and DHA.   Discussed treatment for recurrent miscarriages including lab work and use of Aspirin 81 mg PO daily. Patient declines aspirin due to history of Chron's diseases.   Labs: see orders.   Rx: Progesterone, see orders.   Reviewed red flag symptoms and when to call.   RTC x `1-2 weeks for dating/viability  [place], RTC x 2-3 weeks for nurse intake.    Diona Fanti, CNM  A total of 20 minutes were spent face-to-face with the patient during the encounter with greater than 50% dealing with counseling and coordination of care.

## 2016-11-02 ENCOUNTER — Telehealth: Payer: Self-pay

## 2016-11-02 ENCOUNTER — Other Ambulatory Visit: Payer: Self-pay | Admitting: Certified Nurse Midwife

## 2016-11-02 DIAGNOSIS — R768 Other specified abnormal immunological findings in serum: Secondary | ICD-10-CM | POA: Insufficient documentation

## 2016-11-02 NOTE — Telephone Encounter (Signed)
Called pt no answer. LM for pt informing her of information below. Call back for questions.

## 2016-11-02 NOTE — Telephone Encounter (Signed)
Pt calls and questions CMV lab please advise.

## 2016-11-02 NOTE — Telephone Encounter (Signed)
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 Higher level indicated that infection was probably more than six (6) months ago. Patient may come in for redraw of CMV IgG and IgM to rule out new infection, even though lab work may inconclusive. There are no signs of active infection at this time; fever, chills, flu like symptoms (virus stuff). And even if active virus management options are tylenol and symptom treatment. There are risk associated with active infection, that can be reviewed in detail at follow up visit. TORCH titers are usually obtained during pregnancy or in someone with history of pregnancy loss. CMV is not uncommon in people who take immunomodulating drugs (she has Chron's). I also included a fact sheet at the end of her MyChart message, but will mail her patient teaching from [employer] when I return to the office. Thanks, JML.

## 2016-11-02 NOTE — Telephone Encounter (Signed)
Please inform patient labs released via MyChart. Continue progesterone and follow up as scheduled. Thanks, JML

## 2016-11-05 NOTE — Telephone Encounter (Signed)
Telephone call to patient, verified full name and date of birth.   Results reviewed regarding positive CMV IgG and questions answered. Will send fact sheet to patient in mail. Reviewed red flag symptoms and when to call.   Return to clinic as previously scheduled.    Diona Fanti, CNM

## 2016-11-05 NOTE — Telephone Encounter (Signed)
Sharyn Lull, I do not feel comfortable calling this pt, can you give me a better understanding before I call her. Thanks

## 2016-11-09 ENCOUNTER — Encounter: Payer: Self-pay | Admitting: Obstetrics and Gynecology

## 2016-11-09 ENCOUNTER — Other Ambulatory Visit: Payer: Self-pay | Admitting: Certified Nurse Midwife

## 2016-11-09 DIAGNOSIS — Z369 Encounter for antenatal screening, unspecified: Secondary | ICD-10-CM

## 2016-11-12 ENCOUNTER — Ambulatory Visit (INDEPENDENT_AMBULATORY_CARE_PROVIDER_SITE_OTHER): Payer: Self-pay

## 2016-11-12 ENCOUNTER — Ambulatory Visit (INDEPENDENT_AMBULATORY_CARE_PROVIDER_SITE_OTHER): Payer: Self-pay | Admitting: Certified Nurse Midwife

## 2016-11-12 VITALS — BP 123/62 | HR 83 | Wt 163.2 lb

## 2016-11-12 DIAGNOSIS — Z369 Encounter for antenatal screening, unspecified: Secondary | ICD-10-CM

## 2016-11-12 DIAGNOSIS — Z3A01 Less than 8 weeks gestation of pregnancy: Secondary | ICD-10-CM

## 2016-11-12 NOTE — Progress Notes (Signed)
Jerilee Hoh presents for NOB nurse interview visit. Pregnancy confirmation done 8/30/18______.  G-8 .  P-0070 . Pregnancy education material explained and given. _0__ cats in the home. NOB labs ordered. (TSH/HbgA1c due to Increased BMI), (sickle cell). HIV labs and Drug screen were explained optional and she did not decline. Drug screen ordered PNV encouraged. Genetic screening options discussed. Genetic testing:Unsure.  Pt may discuss with provider. Pt. To follow up with provider in _4_ weeks for NOB physical.  All questions answered.

## 2016-11-25 ENCOUNTER — Encounter: Payer: Self-pay | Admitting: Certified Nurse Midwife

## 2016-11-25 ENCOUNTER — Telehealth: Payer: Self-pay | Admitting: Certified Nurse Midwife

## 2016-11-25 NOTE — Telephone Encounter (Signed)
Patient with c/o cramping for the past week mostly on the right side - no spotting but she has had miscarriage X7 and wants to know if she should be seen.  Please call

## 2016-11-30 ENCOUNTER — Ambulatory Visit (INDEPENDENT_AMBULATORY_CARE_PROVIDER_SITE_OTHER): Payer: Self-pay | Admitting: Certified Nurse Midwife

## 2016-11-30 ENCOUNTER — Ambulatory Visit (INDEPENDENT_AMBULATORY_CARE_PROVIDER_SITE_OTHER): Payer: Self-pay

## 2016-11-30 ENCOUNTER — Telehealth: Payer: Self-pay | Admitting: Certified Nurse Midwife

## 2016-11-30 ENCOUNTER — Other Ambulatory Visit: Payer: Medicaid Other

## 2016-11-30 VITALS — BP 101/71 | HR 94 | Wt 162.4 lb

## 2016-11-30 DIAGNOSIS — O200 Threatened abortion: Secondary | ICD-10-CM

## 2016-11-30 NOTE — Patient Instructions (Signed)

## 2016-11-30 NOTE — Progress Notes (Signed)
TAB- see u/s from [facility]. NO vb. Pos for abd cramps.

## 2016-11-30 NOTE — Telephone Encounter (Signed)
Patient called and stated that she was seen at the ED in [place] over the weekend for sever cramping and ED were unable to hear heartbeat. ED advised patient to follow up with her OBGYN office, Patient has been added to the schedule this morning 11/30/2016 at 10:30. Please advise.

## 2016-11-30 NOTE — Progress Notes (Signed)
GYN ENCOUNTER NOTE  Subjective:       Kathleen Owen is a 26 y.o. G46P0070 female is here for gynecologic evaluation of the following issues:  1. Follow up form ED visit. Pt states that she was seen in the ED over the weekend and was told they did not see a heart beat. She complains of cramping for the last several days with no vaginal bleeding. She has a history of recurrent losses.    Gynecologic History Patient's last menstrual period was 09/15/2016 (exact date). Contraception: none   Obstetric History OB History  Gravida Para Term Preterm AB Living  8       7    SAB TAB Ectopic Multiple Live Births  5 1          # Outcome Date GA Lbr Len/2nd Weight Sex Delivery Anes PTL Lv  8 Current           7 SAB 07/22/14          6 SAB 04/23/14          5 SAB 03/23/14          4 SAB 03/23/13          3 TAB 07/21/08          2 AB 07/22/06     TAB     1 SAB         ND      Past Medical History:  Diagnosis Date  . 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 CD (Crohn's disease) ([place]) 07/30/2014  . Crohn's disease [facility]) Jan 2016  . Miscarriage 07/18/2014    Past Surgical History:  Procedure Laterality Date  . TONSILLECTOMY      Current Outpatient Prescriptions on File Prior to Visit  Medication Sig Dispense Refill  . progesterone (PROMETRIUM) 200 MG capsule Place one capsule vaginally at bedtime 30 capsule 3   No current facility-administered medications on file prior to visit.     Allergies  Allergen Reactions  . Aspirin     Chron's Disease  . Ibuprofen     Chron's Disease  . Amoxicillin Rash    Social History   Social History  . Marital status: Single    Spouse name: N/A  . Number of children: N/A  . Years of education: N/A   Occupational History  . Not on file.   Social History Main Topics  . Smoking status: Current Every Day Smoker    Packs/day: 0.50    Types: Cigarettes  . Smokeless tobacco: Never Used  .  Alcohol use No     Comment: occasionally  . Drug use: No  . Sexual activity: Yes    Birth control/ protection: None   Other Topics Concern  . Not on file   Social History Narrative  . No narrative on file    Family History  Problem Relation Age of Onset  . Kidney disease Maternal Uncle   . Heart failure Maternal Uncle   . Heart failure Maternal Grandmother   . Hypertension Paternal Grandmother   . Thyroid disease Paternal Aunt     The following portions of the patient's history were reviewed and updated as appropriate: allergies, current medications, past family history, past medical history, past social history, past surgical history and problem list.  Review of Systems Review of Systems - Negative except as mentioned in HPI  Review of Systems - General ROS: negative for -  chills, fatigue, fever, hot flashes, malaise or night sweats Hematological and Lymphatic ROS: negative for - bleeding problems or swollen lymph nodes Gastrointestinal ROS: negative for - abdominal pain, blood in stools, change in bowel habits and nausea/vomiting Musculoskeletal ROS: negative for - joint pain, muscle pain or muscular weakness Genito-Urinary ROS: positive for cramping, no bleeding or vaginal discharge  Objective:   BP 101/71   Pulse 94   Wt 162 lb 6.4 oz (73.7 kg)   LMP 09/15/2016 (Exact Date)   BMI 28.77 kg/m  CONSTITUTIONAL: Well-developed, well-nourished female in no acute distress.  HENT:  Normocephalic, atraumatic.  NECK: Normal range of motion, supple,   SKIN: Skin is warm and dry. No rash noted. Not diaphoretic. No erythema. No pallor. [place]: Alert and oriented to person, place, and time.  PSYCHIATRIC: Normal mood and affect. Normal behavior. Normal judgment and thought content. CARDIOVASCULAR:Not Examined RESPIRATORY: Not Examined BREASTS: Not Examined ABDOMEN: Soft, non distended; Non tender.  No Organomegaly. PELVIC: not indicated  MUSCULOSKELETAL: Normal range of  motion. No tenderness.  No cyanosis, clubbing, or edema.   Assessment:   1. Threatened miscarriage  - Beta HCG, Quant - Beta HCG, Quant; Future - CMV abs, IgG+IgM (cytomegalovirus) - US OB Comp Less 14 Wks; Future - INR/PT     Plan:  Return this afternoon for u/s , beta Hcg today, repeat in 48 hrs.   Philip Aspen, CNM

## 2016-12-01 ENCOUNTER — Emergency Department
Admission: EM | Admit: 2016-12-01 | Discharge: 2016-12-02 | Disposition: A | Discharge status: Home / Self Care | Payer: Self-pay | Attending: Emergency Medicine | Admitting: Emergency Medicine

## 2016-12-01 ENCOUNTER — Encounter: Payer: Self-pay | Admitting: Emergency Medicine

## 2016-12-01 DIAGNOSIS — O021 Missed abortion: Secondary | ICD-10-CM | POA: Insufficient documentation

## 2016-12-01 DIAGNOSIS — Z79899 Other long term (current) drug therapy: Secondary | ICD-10-CM | POA: Insufficient documentation

## 2016-12-01 DIAGNOSIS — O039 Complete or unspecified spontaneous abortion without complication: Secondary | ICD-10-CM

## 2016-12-01 DIAGNOSIS — F17210 Nicotine dependence, cigarettes, uncomplicated: Secondary | ICD-10-CM | POA: Insufficient documentation

## 2016-12-01 DIAGNOSIS — K5090 Crohn's disease, unspecified, without complications: Secondary | ICD-10-CM | POA: Insufficient documentation

## 2016-12-01 DIAGNOSIS — Z79890 Hormone replacement therapy (postmenopausal): Secondary | ICD-10-CM | POA: Insufficient documentation

## 2016-12-01 LAB — COMPREHENSIVE METABOLIC PANEL
ALT: 15 U/L (ref 14–54)
ANION GAP: 12 (ref 5–15)
AST: 26 U/L (ref 15–41)
Albumin: 3.9 g/dL (ref 3.5–5.0)
Alkaline Phosphatase: 46 U/L (ref 38–126)
BUN: 9 mg/dL (ref 6–20)
CHLORIDE: 105 mmol/L (ref 101–111)
CO2: 21 mmol/L — AB (ref 22–32)
Calcium: 9.3 mg/dL (ref 8.9–10.3)
Creatinine, Ser: 0.6 mg/dL (ref 0.44–1.00)
GFR calc non Af Amer: >60 mL/min (ref >60)
Glucose, Bld: 134 mg/dL — ABNORMAL HIGH (ref 65–99)
POTASSIUM: 2.8 mmol/L — AB (ref 3.5–5.1)
SODIUM: 138 mmol/L (ref 135–145)
Total Bilirubin: 0.4 mg/dL (ref 0.3–1.2)
Total Protein: 7.8 g/dL (ref 6.5–8.1)

## 2016-12-01 LAB — CBC WITH DIFFERENTIAL/PLATELET
Basophils Absolute: 0.2 10*3/uL — ABNORMAL HIGH (ref 0–0.1)
Basophils Relative: 1 %
EOS ABS: 0.5 10*3/uL (ref 0–0.7)
EOS PCT: 2 %
HCT: 38.7 % (ref 35.0–47.0)
Hemoglobin: 13.1 g/dL (ref 12.0–16.0)
LYMPHS ABS: 6.8 10*3/uL — AB (ref 1.0–3.6)
LYMPHS PCT: 26 %
MCH: 29.8 pg (ref 26.0–34.0)
MCHC: 33.8 g/dL (ref 32.0–36.0)
MCV: 88.1 fL (ref 80.0–100.0)
MONO ABS: 1.5 10*3/uL — AB (ref 0.2–0.9)
MONOS PCT: 6 %
Neutro Abs: 17.5 10*3/uL — ABNORMAL HIGH (ref 1.4–6.5)
Neutrophils Relative %: 65 %
Platelets: 297 10*3/uL (ref 150–440)
RBC: 4.39 MIL/uL (ref 3.80–5.20)
RDW: 13.8 % (ref 11.5–14.5)
WBC: 26.4 10*3/uL — ABNORMAL HIGH (ref 3.6–11.0)

## 2016-12-01 LAB — HCG, QUANTITATIVE, PREGNANCY: hCG, Beta Chain, Quant, S: 1278 m[IU]/mL — ABNORMAL HIGH (ref <5)

## 2016-12-01 LAB — PROTIME-INR
INR: 1 (ref 0.8–1.2)
Prothrombin Time: 10.3 s (ref 9.1–12.0)

## 2016-12-01 LAB — BETA HCG QUANT (REF LAB): hCG Quant: 1940 m[IU]/mL

## 2016-12-01 LAB — CMV ABS, IGG+IGM (CYTOMEGALOVIRUS)

## 2016-12-01 MED ORDER — FENTANYL CITRATE (PF) 100 MCG/2ML IJ SOLN
INTRAMUSCULAR | Status: AC
  Administered 2016-12-01: 100 ug via INTRAVENOUS
  Filled 2016-12-01: qty 2

## 2016-12-01 MED ORDER — HYDROMORPHONE HCL 1 MG/ML IJ SOLN
1.0000 mg | Freq: Once | INTRAMUSCULAR | Status: AC
  Administered 2016-12-01: 1 mg via INTRAVENOUS

## 2016-12-01 MED ORDER — HYDROMORPHONE HCL 1 MG/ML IJ SOLN
1.0000 mg | Freq: Once | INTRAMUSCULAR | Status: AC
  Administered 2016-12-01: 1 mg via INTRAVENOUS
  Filled 2016-12-01: qty 1

## 2016-12-01 MED ORDER — HYDROMORPHONE HCL 1 MG/ML IJ SOLN
INTRAMUSCULAR | Status: AC
  Administered 2016-12-01: 1 mg via INTRAVENOUS
  Filled 2016-12-01: qty 1

## 2016-12-01 MED ORDER — SODIUM CHLORIDE 0.9 % IV BOLUS (SEPSIS)
1000.0000 mL | Freq: Once | INTRAVENOUS | Status: AC
  Administered 2016-12-01: 1000 mL via INTRAVENOUS

## 2016-12-01 MED ORDER — FENTANYL CITRATE (PF) 100 MCG/2ML IJ SOLN
100.0000 ug | Freq: Once | INTRAMUSCULAR | Status: AC
Start: 2016-12-01 — End: 2016-12-01
  Administered 2016-12-01: 100 ug via INTRAVENOUS

## 2016-12-01 NOTE — ED Provider Notes (Addendum)
[facility] Emergency Department Provider Note  Time seen: 7:49 PM  I have reviewed the triage vital signs and the nursing notes.   HISTORY  Chief Complaint Abdominal Pain and Vaginal Bleeding    HPI Kathleen Owen is a 26 y.o. female With a past medical history of Crohn's disease, G8 P0 A7, approximately [redacted] weeks pregnant by LMP presents the emergency department for likely miscarriage. According to the patient she was seen at [facility] over the weekend with an ultrasound showing no fetal cardiac activity. She is having significant cramping at that time which has worsened significantly today. She was seen by her OB/GYN yesterday had an ultrasound once again verifying 6 week 5 day fetus with no fetal cardiac activity. Patient states she began spotting/bleeding today but states it has been fairly mild but the abdominal cramping has been severe. patient currently in moderate distress, pacing the room, bent over the bed at times moaning in significant pain.  Past Medical History:  Diagnosis Date  . 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 CD (Crohn's disease) ([place]) 07/30/2014  . Crohn's disease [facility]) Jan 2016  . Miscarriage 07/18/2014    Patient Active Problem List   Diagnosis Date Noted  . 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 Smoker 11/22/2014  . Recurrent vaginitis 11/22/2014  . 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 CD (Crohn's disease) ([place]) 07/30/2014  . Body mass index (BMI) of 27.0-27.9 in adult 10/03/2013    Past Surgical History:  Procedure Laterality Date  . TONSILLECTOMY      Prior to Admission medications   Medication Sig Start Date End Date Taking? Authorizing Provider  acetaminophen (TYLENOL) 325 MG tablet Take 650 mg by mouth every 6 (six) hours as needed.    [provider]  progesterone (PROMETRIUM) 200 MG capsule Place  one capsule vaginally at bedtime 10/22/16   Lawhorn, Lara Mulch, CNM    Allergies  Allergen Reactions  . Aspirin     Chron's Disease  . Ibuprofen     Chron's Disease  . Amoxicillin Rash    Family History  Problem Relation Age of Onset  . Kidney disease Maternal Uncle   . Heart failure Maternal Uncle   . Heart failure Maternal Grandmother   . Hypertension Paternal Grandmother   . Thyroid disease Paternal Aunt     Social History Social History  Substance Use Topics  . Smoking status: Current Every Day Smoker    Packs/day: 0.50    Types: Cigarettes  . Smokeless tobacco: Never Used  . Alcohol use Yes     Comment: occasionally    Review of Systems Constitutional: Negative for fever. Cardiovascular: Negative for chest pain. Respiratory: Negative for shortness of breath. Gastrointestinal: positive for lower abdominal pain/cramping. Negative for nausea vomiting or diarrhea Genitourinary: positive for vaginal bleeding starting today. Has not yet had to change her pad per patient. Musculoskeletal: Negative for back pain Neurological: Negative for headache All other ROS negative  ____________________________________________   PHYSICAL EXAM:  VITAL SIGNS: ED Triage Vitals  Enc Vitals Group     BP 12/01/16 1916 124/83     Pulse Rate 12/01/16 1916 86     Resp 12/01/16 1916 (!) 22     Temp 12/01/16 1916 97.6 F (36.4 C)     Temp Source 12/01/16 1916 Oral     SpO2 12/01/16 1916 99 %  Weight 12/01/16 1917 162 lb (73.5 kg)     Height 12/01/16 1917 5\' 3"  (1.6 m)     Head Circumference --      Peak Flow --      Pain Score 12/01/16 1916 10     Pain Loc --      Pain Edu? --      Excl. in [place]? --    Constitutional: alert, oriented, moderate distress due to abdominal pain. Eyes: Normal exam ENT   Head: Normocephalic and atraumatic   Mouth/Throat: Mucous membranes are moist. Cardiovascular: Normal rate, regular rhythm. No murmur Respiratory: Normal  respiratory effort without tachypnea nor retractions. Breath sounds are clear Gastrointestinal:mild suprapubic tenderness, no rebound or guarding. No distention.patient states the pain feels like it is deeper within the abdomen. Musculoskeletal: Nontender with normal range of motion in all extremities.  Neurologic:  Normal speech and language. No gross focal neurologic deficits  Skin:  Skin is warm, dry and intact.  Psychiatric: Mood and affect are normal.   ____________________________________________    INITIAL IMPRESSION / ASSESSMENT AND PLAN / ED COURSE  Pertinent labs & imaging results that were available during my care of the patient were reviewed by me and considered in my medical decision making (see chart for details).  patient presents to the emergency department with abdominal pain and vaginal bleeding. Ultrasound performed yesterday showing 6 week 5 day intrauterine pregnancy with no cardiac activity consistent with miscarriage. Differential this time includes miscarriage, threatened miscarriage less likely ectopic given ultrasound findings.we will check labs. Patient had not sound performed yesterday do not believe repeat ultrasound would be of much help. We will treat with pain medication.  I discussed the patient with Dr. Amalia Hailey of OB/GYN. He recommends pain control and pelvic examination. At this time the patient continues to be in moderate distress we will attempt to try a pelvic examination once the patient is more calm and able to lie in bed. Patient has received 100 g of fentanyl, we will dose 1 mg of Dilaudid IV. Patient is to be watched on continuous pulse oximetry.  pelvic examination shows mild amount of bleeding, no tissue in the os.  Patient's labs show a significant leukocytosis of 26,000, beta hCG 1200. Patient continues to have significant pain after 2 mg of Dilaudid and 100 g of fentanyl. Patient continues to have significant pain we will discuss with OB for  admission.  patient continues with significant abdominal pain. Discussed with Dr. Amalia Hailey once again. Patient to be taken for D&C.  Dr. Amalia Hailey states unfortunately and there are 2 emergent cases going on currently and it will likely be several hours before he is able to take her to the operating room. I discussed this with the patient is agreeable/understandable. ____________________________________________   FINAL CLINICAL IMPRESSION(S) / ED DIAGNOSES  abdominal pain/cramping Miscarriage    Harvest Dark, MD 12/01/16 2148    Harvest Dark, MD 12/01/16 2251

## 2016-12-01 NOTE — ED Notes (Signed)
Transported to OR via tech with one family member

## 2016-12-01 NOTE — ED Triage Notes (Signed)
Pt presents to ED from home by EMS with c/o vaginal bleeding and severe lower abd pain. Seen by her obgyn yesterday for 10 week pregnancy and was told she was having a miscarriage. Confirmed by ultrasound. Pt states this is her 8th miscarriage but pain has never been as severe. Pain and bleeding started this afternoon. passing clots. Unsure of how heavily she is bleeding. Restless.

## 2016-12-01 NOTE — Telephone Encounter (Signed)
Attempted to call Kathleen Owen to review ultrasound results and labs. No answer. Message left for her to call back.   Philip Aspen, CNM

## 2016-12-02 ENCOUNTER — Emergency Department: Payer: Self-pay | Admitting: Anesthesiology

## 2016-12-02 ENCOUNTER — Encounter: Payer: Self-pay | Admitting: Certified Nurse Midwife

## 2016-12-02 ENCOUNTER — Encounter: Payer: Self-pay | Admitting: Anesthesiology

## 2016-12-02 ENCOUNTER — Encounter
Admission: EM | Disposition: A | Discharge status: Home / Self Care | Payer: Self-pay | Source: Home / Self Care | Attending: Emergency Medicine

## 2016-12-02 DIAGNOSIS — O039 Complete or unspecified spontaneous abortion without complication: Secondary | ICD-10-CM

## 2016-12-02 HISTORY — PX: DILATION AND EVACUATION: SHX1459

## 2016-12-02 SURGERY — DILATION AND EVACUATION, UTERUS
Anesthesia: General

## 2016-12-02 MED ORDER — OXYTOCIN 10 UNIT/ML IJ SOLN
INTRAMUSCULAR | Status: AC
  Filled 2016-12-02: qty 2

## 2016-12-02 MED ORDER — FENTANYL CITRATE (PF) 100 MCG/2ML IJ SOLN
INTRAMUSCULAR | Status: AC
  Administered 2016-12-02: 25 ug via INTRAVENOUS
  Filled 2016-12-02: qty 2

## 2016-12-02 MED ORDER — FENTANYL CITRATE (PF) 100 MCG/2ML IJ SOLN
25.0000 ug | INTRAMUSCULAR | Status: AC | PRN
  Administered 2016-12-02 (×6): 25 ug via INTRAVENOUS

## 2016-12-02 MED ORDER — OXYCODONE-ACETAMINOPHEN 5-325 MG PO TABS
1.0000 | ORAL_TABLET | ORAL | Status: DC | PRN
Start: 2016-12-02 — End: 2016-12-02
  Administered 2016-12-02: 2 via ORAL

## 2016-12-02 MED ORDER — FENTANYL CITRATE (PF) 100 MCG/2ML IJ SOLN
INTRAMUSCULAR | Status: AC
  Filled 2016-12-02: qty 2

## 2016-12-02 MED ORDER — KETOROLAC TROMETHAMINE 30 MG/ML IJ SOLN
30.0000 mg | Freq: Once | INTRAMUSCULAR | Status: AC
  Administered 2016-12-02: 30 mg via INTRAVENOUS
  Filled 2016-12-02: qty 1

## 2016-12-02 MED ORDER — ONDANSETRON HCL 4 MG/2ML IJ SOLN
INTRAMUSCULAR | Status: DC | PRN
  Administered 2016-12-02: 4 mg via INTRAVENOUS

## 2016-12-02 MED ORDER — KETOROLAC TROMETHAMINE 30 MG/ML IJ SOLN
INTRAMUSCULAR | Status: AC
  Filled 2016-12-02: qty 1

## 2016-12-02 MED ORDER — OXYTOCIN 10 UNIT/ML IJ SOLN
INTRAVENOUS | Status: DC | PRN
  Administered 2016-12-02: 20 [IU] via INTRAVENOUS

## 2016-12-02 MED ORDER — PROPOFOL 10 MG/ML IV BOLUS
INTRAVENOUS | Status: DC | PRN
Start: 2016-12-02 — End: 2016-12-02
  Administered 2016-12-02: 150 mg via INTRAVENOUS

## 2016-12-02 MED ORDER — LIDOCAINE HCL (CARDIAC) 20 MG/ML IV SOLN
INTRAVENOUS | Status: DC | PRN
  Administered 2016-12-02: 40 mg via INTRAVENOUS

## 2016-12-02 MED ORDER — LACTATED RINGERS IV SOLN
INTRAVENOUS | Status: DC | PRN
  Administered 2016-12-02: 01:00:00 via INTRAVENOUS

## 2016-12-02 MED ORDER — PROMETHAZINE HCL 25 MG/ML IJ SOLN: 6.2500 mg | INTRAMUSCULAR | Status: DC | PRN

## 2016-12-02 MED ORDER — PROPOFOL 10 MG/ML IV BOLUS
INTRAVENOUS | Status: AC
  Filled 2016-12-02: qty 20

## 2016-12-02 MED ORDER — FENTANYL CITRATE (PF) 100 MCG/2ML IJ SOLN
INTRAMUSCULAR | Status: DC | PRN
  Administered 2016-12-02 (×2): 50 ug via INTRAVENOUS

## 2016-12-02 MED ORDER — OXYCODONE-ACETAMINOPHEN 5-325 MG PO TABS
ORAL_TABLET | ORAL | Status: AC
  Administered 2016-12-02: 2 via ORAL
  Filled 2016-12-02: qty 2

## 2016-12-02 SURGICAL SUPPLY — 23 items
ADAPTER VACURETTE TBG SET 14 (CANNULA) IMPLANT
CATH ROBINSON RED A/P 16FR (CATHETERS) IMPLANT
DRSG TELFA 3X8 NADH (GAUZE/BANDAGES/DRESSINGS) IMPLANT
FILTER UTR ASPR SPEC (MISCELLANEOUS) ×1 IMPLANT
FLTR UTR ASPR SPEC (MISCELLANEOUS) ×2
GLOVE ORTHO TXT STRL SZ7.5 (GLOVE) ×4 IMPLANT
GOWN STRL REUS W/ TWL LRG LVL3 (GOWN DISPOSABLE) ×2 IMPLANT
GOWN STRL REUS W/TWL LRG LVL3 (GOWN DISPOSABLE) ×2
KIT BERKELEY 1ST TRIMESTER 3/8 (MISCELLANEOUS) ×2 IMPLANT
KIT RM TURNOVER STRD PROC AR (KITS) ×2 IMPLANT
NEEDLE HYPO 25X1 1.5 SAFETY (NEEDLE) IMPLANT
PACK DNC HYST (MISCELLANEOUS) ×2 IMPLANT
PAD OB MATERNITY 4.3X12.25 (PERSONAL CARE ITEMS) ×2 IMPLANT
PAD PREP 24X41 OB/GYN DISP (PERSONAL CARE ITEMS) ×2 IMPLANT
SET BERKELEY SUCTION TUBING (SUCTIONS) ×2 IMPLANT
SOL PREP PVP 2OZ (MISCELLANEOUS) ×2
SOLUTION PREP PVP 2OZ (MISCELLANEOUS) ×1 IMPLANT
SPONGE XRAY 4X4 16PLY STRL (MISCELLANEOUS) IMPLANT
VACURETTE 10 RIGID CVD (CANNULA) IMPLANT
VACURETTE 12 RIGID CVD (CANNULA) IMPLANT
VACURETTE 7MM F TIP (CANNULA)
VACURETTE 7MM F TIP STRL (CANNULA) IMPLANT
VACURETTE 8 RIGID CVD (CANNULA) ×2 IMPLANT

## 2016-12-02 NOTE — Transfer of Care (Signed)
Immediate Anesthesia Transfer of Care Note  Patient: Kathleen Owen  Procedure(s) Performed: DILATATION AND EVACUATION (N/A )  Patient Location: PACU  Anesthesia Type:General  Level of Consciousness: awake, alert , oriented and patient cooperative  Airway & Oxygen Therapy: Patient Spontanous Breathing  Post-op Assessment: Report given to RN and Post -op Vital signs reviewed and stable  Post vital signs: Reviewed and stable  Last Vitals:  Vitals:   12/01/16 2330 12/01/16 2345  BP: 126/73 137/71  Pulse:    Resp:    Temp:    SpO2:      Last Pain:  Vitals:   12/01/16 2355  TempSrc:   PainSc: 9          Complications: No apparent anesthesia complications

## 2016-12-02 NOTE — Anesthesia Postprocedure Evaluation (Signed)
Anesthesia Post Note  Patient: Kathleen Owen  Procedure(s) Performed: DILATATION AND EVACUATION (N/A )  Patient location during evaluation: PACU Anesthesia Type: General Level of consciousness: awake and alert Pain management: pain level controlled Vital Signs Assessment: post-procedure vital signs reviewed and stable Respiratory status: spontaneous breathing, nonlabored ventilation, respiratory function stable and patient connected to nasal cannula oxygen Cardiovascular status: blood pressure returned to baseline and stable Postop Assessment: no apparent nausea or vomiting Anesthetic complications: no     Last Vitals:  Vitals:   12/02/16 0230 12/02/16 0235  BP: 108/61 (!) 113/58  Pulse: 86 87  Resp: 19 17  Temp: (!) 36.4 C   SpO2: 99% 99%    Last Pain:  Vitals:   12/02/16 0235  TempSrc:   PainSc: 2                  Martha Clan

## 2016-12-02 NOTE — H&P (Signed)
@LOGO @      [facility]  PCP:  Stoney Bang, MD Subjective:   HPI:  Kathleen Owen is a 26 y.o. [id number].  Patient's last menstrual period was 12/01/2016 (exact date).  She presents today for a pre-op discussion and PE.  She has the following symptoms:  U/S revealing IUP without heartones.  Pt having bleeding and cramping.  Seen through ED.  Of significant note, pt has had 7 prior first trimester losses without successful pregnancy.  Review of Systems:   Constitutional: Denied constitutional symptoms, night sweats, recent illness, fatigue, fever, insomnia and weight loss.  Eyes: Denied eye symptoms, eye pain, photophobia, vision change and visual disturbance.  Ears/Nose/Throat/Neck: Denied ear, nose, throat or neck symptoms, hearing loss, nasal discharge, sinus congestion and sore throat.  Cardiovascular: Denied cardiovascular symptoms, arrhythmia, chest pain/pressure, edema, exercise intolerance, orthopnea and palpitations.  Respiratory: Denied pulmonary symptoms, asthma, pleuritic pain, productive sputum, cough, dyspnea and wheezing.  Gastrointestinal: Denied, gastro-esophageal reflux, melena, nausea and vomiting.  Genitourinary: See HPI  Musculoskeletal: Denied musculoskeletal symptoms, stiffness, swelling, muscle weakness and myalgia.  Dermatologic: Denied dermatology symptoms, rash and scar.  Neurologic: Denied neurology symptoms, dizziness, headache, neck pain and syncope.  Psychiatric: Denied psychiatric symptoms, anxiety and depression.  Endocrine: Denied endocrine symptoms including hot flashes and night sweats.   OB History  Gravida Para Term Preterm AB Living  8       7    SAB TAB Ectopic Multiple Live Births  5 1          # Outcome Date GA Lbr Len/2nd Weight Sex Delivery Anes PTL Lv  8 Current           7 SAB 07/22/14          6 SAB 04/23/14          5 SAB 03/23/14          4 SAB 03/23/13          3 TAB 07/21/08          2 AB  07/22/06     TAB     1 SAB         ND      Past Medical History:  Diagnosis Date  . 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 CD (Crohn's disease) ([place]) 07/30/2014  . Crohn's disease [facility]) Jan 2016  . Miscarriage 07/18/2014    Past Surgical History:  Procedure Laterality Date  . TONSILLECTOMY        SOCIAL HISTORY: History  Smoking Status  . Current Every Day Smoker  . Packs/day: 0.50  . Types: Cigarettes  Smokeless Tobacco  . Never Used   History  Alcohol Use  . Yes    Comment: occasionally   History  Drug Use No    Family History  Problem Relation Age of Onset  . Kidney disease Maternal Uncle   . Heart failure Maternal Uncle   . Heart failure Maternal Grandmother   . Hypertension Paternal Grandmother   . Thyroid disease Paternal Aunt     ALLERGIES:  Aspirin; Ibuprofen; and Amoxicillin  MEDS:   No current facility-administered medications on file prior to encounter.    Current Outpatient Prescriptions on File Prior to Encounter  Medication Sig Dispense Refill  . acetaminophen (TYLENOL) 325 MG tablet Take 650 mg by mouth every 6 (six) hours as needed.    . progesterone (PROMETRIUM) 200 MG capsule  Place one capsule vaginally at bedtime 30 capsule 3    Meds ordered this encounter  Medications  . fentaNYL (SUBLIMAZE) injection 100 mcg  . fentaNYL (SUBLIMAZE) 100 MCG/2ML injection    Carver Fila   : cabinet override  . HYDROmorphone (DILAUDID) 1 MG/ML injection    [place] Fila   : cabinet override  . HYDROmorphone (DILAUDID) injection 1 mg  . sodium chloride 0.9 % bolus 1,000 mL  . HYDROmorphone (DILAUDID) injection 1 mg  . HYDROmorphone (DILAUDID) injection 1 mg     Physical examination BP 117/68 (BP Location: Left Arm)   Pulse 95   Temp 98.4 F (36.9 C) (Oral)   Resp 20   Ht 5' 3"  (1.6 m)   Wt 162 lb (73.5 kg)   LMP 12/01/2016 (Exact Date)   SpO2 100%   BMI 28.70 kg/m   General NAD,  Conversant  HEENT Atraumatic; Op clear with mmm.  Normo-cephalic. Pupils reactive. Anicteric sclerae  Thyroid/Neck Smooth without nodularity or enlargement. Normal ROM.  Neck Supple.  Skin No rashes, lesions or ulceration. Normal palpated skin turgor. No nodularity.  Breasts: No masses or discharge.  Symmetric.  No axillary adenopathy.  Lungs: Clear to auscultation.No rales or wheezes. Normal Respiratory effort, no retractions.  Heart: NSR.  No murmurs or rubs appreciated. No periferal edema  Abdomen: Soft.  Non-tender.  No masses.  No HSM. No hernia  Extremities: Moves all appropriately.  Normal ROM for age. No lymphadenopathy.  Neuro: Oriented to PPT.  Normal mood. Normal affect.     Pelvic:  Deferred for OR - done in ED   Assessment:   [id number] Patient Active Problem List   Diagnosis Date Noted  . 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 Smoker 11/22/2014  . Recurrent vaginitis 11/22/2014  . 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 CD (Crohn's disease) ([place]) 07/30/2014  . Body mass index (BMI) of 27.0-27.9 in adult 10/03/2013    1. Miscarriage     Incomplete Ab - recurrent pregnancy loss   Plan:   Orders: Meds ordered this encounter  Medications  . fentaNYL (SUBLIMAZE) injection 100 mcg  . fentaNYL (SUBLIMAZE) 100 MCG/2ML injection    Carver Fila   : cabinet override  . HYDROmorphone (DILAUDID) 1 MG/ML injection    Carver Fila   : cabinet override  . HYDROmorphone (DILAUDID) injection 1 mg  . sodium chloride 0.9 % bolus 1,000 mL  . HYDROmorphone (DILAUDID) injection 1 mg  . HYDROmorphone (DILAUDID) injection 1 mg     1.  D&E  Pre-op discussions regarding Risks and Benefits of her scheduled surgery.  D&C/E The procedure and the risks and benefits of dilation and curettage/evacuation have been explained to the patient.  The specific risks of bleeding, infection, anesthesia, uterine perforation, and  damage to bowel or bladder  have been specifically discussed.  I have answered all of her questions and I believe that she has an adequate and informed understanding of this procedure.   Finis Bud, M.D. 12/02/2016 12:31 AM

## 2016-12-02 NOTE — Anesthesia Preprocedure Evaluation (Signed)
Anesthesia Evaluation  Patient identified by MRN, date of birth, ID band Patient awake    Reviewed: Allergy & Precautions, H&P , NPO status , Patient's Chart, lab work & pertinent test results, reviewed documented beta blocker date and time   History of Anesthesia Complications Negative for: history of anesthetic complications  Airway Mallampati: III  TM Distance: >3 FB Neck ROM: full    Dental  (+) Dental Advidsory Given, Missing, Teeth Intact   Pulmonary neg shortness of breath, asthma , neg sleep apnea, neg COPD, neg recent URI, Current Smoker,           Cardiovascular Exercise Tolerance: Good negative cardio ROS       Neuro/Psych negative neurological ROS  negative psych ROS   GI/Hepatic negative GI ROS, Neg liver ROS,   Endo/Other  negative endocrine ROS  Renal/GU negative Renal ROS  negative genitourinary   Musculoskeletal   Abdominal   Peds  Hematology negative hematology ROS (+)   Anesthesia Other Findings Past Medical History: 07/30/2014: Airway hyperreactivity August 2016: Bacterial vaginosis     Comment:  recurrent BV 07/30/2014: CD (Crohn's disease) ([place]) Jan 2016: Crohn's disease ([place]) 07/18/2014: Miscarriage   Reproductive/Obstetrics negative OB ROS                             Anesthesia Physical Anesthesia Plan  ASA: II  Anesthesia Plan: General   Post-op Pain Management:    Induction: Intravenous  PONV Risk Score and Plan: 2 and Ondansetron and Dexamethasone  Airway Management Planned: LMA  Additional Equipment:   Intra-op Plan:   Post-operative Plan: Extubation in OR  Informed Consent: I have reviewed the patients History and Physical, chart, labs and discussed the procedure including the risks, benefits and alternatives for the proposed anesthesia with the patient or authorized representative who has indicated his/her understanding and acceptance.    Dental Advisory Given  Plan Discussed with: Anesthesiologist, CRNA and Surgeon  Anesthesia Plan Comments:         Anesthesia Quick Evaluation

## 2016-12-02 NOTE — Op Note (Signed)
    OPERATIVE NOTE 12/02/2016 1:25 AM  PRE-OPERATIVE DIAGNOSIS:  1) missed abortion  POST-OPERATIVE DIAGNOSIS:  1) Same  OPERATION:  D&E  SURGEON(S): Surgeon(s) and Role:    Harlin Heys, MD - Primary   ANESTHESIA: General  ESTIMATED BLOOD LOSS: 56m  OPERATIVE FINDINGS: Gestational sac found in external cervical os  SPECIMEN: * No specimens in log *  COMPLICATIONS: None  DRAINS: Foley to gravity  DISPOSITION: Stable to recovery room  DESCRIPTION OF PROCEDURE:      The patient was prepped and draped in the dorsal lithotomy position and placed under general anesthesia. A pelvic exam was performed and the POC were found part in the vagina and part in her external cervical os.  These were removed.  Her cervix was grasped with a Jacob's tenaculum. Respecting the position and curvature of her cervix, it was dilated to accommodate a number 8 suction curette. The suction curette was placed within the endometrial cavity and a pressure greater than 65 mmHg was allowed to build. A systematic curettage was performed in all quadrants until no additional tissue was noted. The uterus became firm and globular. Pitocin was run in the IV. The tenaculum was removed from the cervix and hemostasis was noted. The weighted speculum was removed and the patient went to recovery room in stable condition.  No follow-up provider specified.  DFinis Bud M.D. 12/02/2016 1:25 AM

## 2016-12-02 NOTE — Discharge Instructions (Signed)
AMBULATORY SURGERY  DISCHARGE INSTRUCTIONS   1) The drugs that you were given will stay in your system until tomorrow so for the next 24 hours you should not:  A) Drive an automobile B) Make any legal decisions C) Drink any alcoholic beverage   2) You may resume regular meals tomorrow.  Today it is better to start with liquids and gradually work up to solid foods.  You may eat anything you prefer, but it is better to start with liquids, then soup and crackers, and gradually work up to solid foods.   3) Please notify your doctor immediately if you have any unusual bleeding, trouble breathing, redness and pain at the surgery site, drainage, fever, or pain not relieved by medication.    4) Additional Instructions:        Please contact your physician with any problems or Same Day Surgery at [phone], Monday through Friday 6 am to 4 pm, or [place] at [facility] number at [phone].  Miscarriage A miscarriage is the loss of an unborn baby (fetus) before the 20th week of pregnancy. The cause is often unknown. Follow these instructions at home:  You may need to stay in bed (bed rest), or you may be able to do light activity. Go about activity as told by your doctor.  Have help at home.  Write down how many pads you use each day. Write down how soaked they are.  Do not use tampons. Do not wash out your vagina (douche) or have sex (intercourse) until your doctor approves.  Only take medicine as told by your doctor.  Do not take aspirin.  Keep all doctor visits as told.  If you or your partner have problems with grieving, talk to your doctor. You can also try counseling. Give yourself time to grieve before trying to get pregnant again. Get help right away if:  You have bad cramps or pain in your back or belly (abdomen).  You have a fever.  You pass large clumps of blood (clots) from your vagina that are walnut-sized or larger. Save the clumps for your doctor  to see.  You pass large amounts of tissue from your vagina. Save the tissue for your doctor to see.  You have more bleeding.  You have thick, bad-smelling fluid (discharge) coming from the vagina.  You get lightheaded, weak, or you pass out (faint).  You have chills. This information is not intended to replace advice given to you by your health care provider. Make sure you discuss any questions you have with your health care provider. Document Released: 05/04/2011 Document Revised: 07/18/2015 Document Reviewed: 03/12/2011 Elsevier Interactive Patient Education  2017 [employer].

## 2016-12-02 NOTE — Anesthesia Post-op Follow-up Note (Signed)
Anesthesia QCDR form completed.        

## 2016-12-02 NOTE — Anesthesia Procedure Notes (Signed)
Procedure Name: LMA Insertion Date/Time: 12/02/2016 1:12 AM Performed by: Lendon Colonel Pre-anesthesia Checklist: Patient identified, Patient being monitored, Timeout performed, Emergency Drugs available and Suction available Patient Re-evaluated:Patient Re-evaluated prior to induction Oxygen Delivery Method: Circle system utilized Preoxygenation: Pre-oxygenation with 100% oxygen Induction Type: IV induction Ventilation: Mask ventilation without difficulty LMA: LMA inserted LMA Size: 4.0 Tube type: Oral Number of attempts: 1 Placement Confirmation: positive ETCO2 and breath sounds checked- equal and bilateral Tube secured with: Tape Dental Injury: Teeth and Oropharynx as per pre-operative assessment

## 2016-12-03 LAB — SURGICAL PATHOLOGY

## 2016-12-10 ENCOUNTER — Encounter: Payer: Medicaid Other | Admitting: Certified Nurse Midwife

## 2016-12-14 ENCOUNTER — Encounter: Payer: Medicaid Other | Admitting: Obstetrics and Gynecology

## 2017-01-01 ENCOUNTER — Encounter: Payer: Medicaid Other | Admitting: Obstetrics and Gynecology

## 2017-03-22 ENCOUNTER — Encounter: Payer: Self-pay | Admitting: Obstetrics and Gynecology

## 2017-03-22 ENCOUNTER — Ambulatory Visit: Payer: Medicaid Other | Admitting: Obstetrics and Gynecology

## 2017-03-30 ENCOUNTER — Encounter: Payer: Medicaid Other | Admitting: Obstetrics and Gynecology

## 2017-04-19 ENCOUNTER — Encounter: Payer: Medicaid Other | Admitting: Obstetrics and Gynecology

## 2017-04-23 ENCOUNTER — Ambulatory Visit (INDEPENDENT_AMBULATORY_CARE_PROVIDER_SITE_OTHER): Payer: Medicaid Other | Admitting: Obstetrics and Gynecology

## 2017-04-23 ENCOUNTER — Encounter: Payer: Self-pay | Admitting: Obstetrics and Gynecology

## 2017-04-23 VITALS — BP 102/67 | HR 88 | Ht 63.0 in | Wt 160.2 lb

## 2017-04-23 DIAGNOSIS — N96 Recurrent pregnancy loss: Secondary | ICD-10-CM

## 2017-04-23 NOTE — Progress Notes (Signed)
Pt is doing well following a miscarriage. Concerning about why she continues to have miscarriage.

## 2017-04-23 NOTE — Progress Notes (Signed)
HPI:      [name] is a 27 y.o. [phone] who LMP was Patient's last menstrual period was 03/10/2016 (lmp unknown).  Subjective:   She presents today after having yet another miscarriage.  Approximately 2 weeks ago she had cramping bleeding and passed tissue.  She is now no longer bleeding and is sure she had another miscarriage.  She states that she is now ready to undergo a workup for recurrent pregnancy loss.    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 She does not have any pertinent problems on file. She  has a past surgical history that includes Tonsillectomy; Dilation and evacuation (N/A, 12/02/2016); and Dilation and curettage of uterus. Her family history includes Heart failure in her maternal grandmother and maternal uncle; Hypertension in her paternal grandmother; Kidney disease in her maternal uncle; Thyroid disease in her paternal aunt. She  reports that she has been smoking cigarettes.  She has been smoking about 0.50 packs per day. she has never used smokeless tobacco. She reports that she drinks alcohol. She reports that she does not use drugs. She currently has no medications in their medication list. She is allergic to aspirin; ibuprofen; and amoxicillin.       Review of Systems:  Review of Systems  Constitutional: Denied constitutional symptoms, night sweats, recent illness, fatigue, fever, insomnia and weight loss.  Eyes: Denied eye symptoms, eye pain, photophobia, vision change and visual disturbance.  Ears/Nose/Throat/Neck: Denied ear, nose, throat or neck symptoms, hearing loss, nasal discharge, sinus congestion and sore throat.  Cardiovascular: Denied cardiovascular symptoms, arrhythmia, chest  pain/pressure, edema, exercise intolerance, orthopnea and palpitations.  Respiratory: Denied pulmonary symptoms, asthma, pleuritic pain, productive sputum, cough, dyspnea and wheezing.  Gastrointestinal: Denied, gastro-esophageal reflux, melena, nausea and vomiting.  Genitourinary: Denied genitourinary symptoms including symptomatic vaginal discharge, pelvic relaxation issues, and urinary complaints.  Musculoskeletal: Denied musculoskeletal symptoms, stiffness, swelling, muscle weakness and myalgia.  Dermatologic: Denied dermatology symptoms, rash and scar.  Neurologic: Denied neurology symptoms, dizziness, headache, neck pain and syncope.  Psychiatric: Denied psychiatric symptoms, anxiety and depression.  Endocrine: Denied endocrine symptoms including hot flashes and night sweats.   Meds:   No current outpatient medications on file prior to visit.   No current facility-administered medications on file prior to visit.     Objective:     Vitals:   04/23/17 0810  BP: 102/67  Pulse: 88                Assessment:    G8P0080 Patient Active Problem List   Diagnosis Date Noted  . 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 Smoker 11/22/2014  . Recurrent vaginitis 11/22/2014  . 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 CD (Crohn's disease) ([place]) 07/30/2014  . Body mass index (BMI) of 27.0-27.9 in adult 10/03/2013     1. Habitual aborter        Plan:            1.  We have discussed recurrent pregnancy loss in some detail.  The workup and future direction of attempting pregnancy have been discussed in detail.  I will order some labs today. (TSH, anticardiolipin antibodies, karyotype, lupus anticoagulant)  I have recommended that her partner also have a karyotype performed.  2.  Should this basic workup prove negative would strongly recommend referral to REI.  Orders Orders Placed This Encounter  Procedures  . TSH  .  Cardiolipin antibodies, IgM+IgG  . Lupus anticoagulant  . Chromosome, Blood, Routine    No orders of the defined types were placed in this encounter.     F/U  Return for We will contact her with any abnormal test results. I spent 17 minutes with this patient of which greater than 50% was spent discussing recurrent pregnancy loss, workup and future direction of care.  Questions regarding testing and success rates and possible future referral.  Finis Bud, M.D. 04/23/2017 9:00 AM

## 2017-05-12 ENCOUNTER — Telehealth: Payer: Self-pay | Admitting: Obstetrics and Gynecology

## 2017-05-12 LAB — CHROMOSOME, BLOOD, ROUTINE
CELLS KARYOTYPED: 2
Cells Analyzed: 20
Cells Counted: 20
GTG BAND RESOLUTION ACHIEVED: 500

## 2017-05-12 LAB — LUPUS ANTICOAGULANT
DPT CONFIRM RATIO: 0.99 ratio (ref 0.00–1.40)
Dilute Viper Venom Time: 30 s (ref 0.0–47.0)
PTT Lupus Anticoagulant: 34 s (ref 0.0–51.9)
THROMBIN TIME: 17.6 s (ref 0.0–23.0)
dPT: 35.1 s (ref 0.0–55.0)

## 2017-05-12 LAB — CARDIOLIPIN ANTIBODIES, IGM+IGG: ANTICARDIOLIPIN IGM: 10 [MPL'U]/mL (ref 0–12)

## 2017-05-12 LAB — TSH: TSH: 3.44 u[IU]/mL (ref 0.450–4.500)

## 2017-05-12 NOTE — Telephone Encounter (Signed)
Pt should get final results thru my chart.

## 2017-05-12 NOTE — Telephone Encounter (Signed)
Pt aware some results are still pending. What is back is wnl per DJE. Final results should be in tomorrow.

## 2017-05-12 NOTE — Telephone Encounter (Signed)
The patient called and stated that she would like to speak with a nurse as soon as possible in regards to her receiving her test results from labs that were taken on 04/23/17. Please advise.

## 2017-05-17 ENCOUNTER — Telehealth: Payer: Self-pay

## 2017-05-17 DIAGNOSIS — N96 Recurrent pregnancy loss: Secondary | ICD-10-CM

## 2017-05-17 NOTE — Telephone Encounter (Signed)
Pt request lab results from 04/23/2017. She would like to know her next steps. Refer to infertility? Does her partner need to be seen?? Pt aware you will get this message tomorrow.

## 2017-05-18 NOTE — Telephone Encounter (Signed)
lmtrc

## 2017-05-19 NOTE — Telephone Encounter (Signed)
Pt aware- Referral placed.  

## 2017-05-25 ENCOUNTER — Encounter: Payer: Self-pay | Admitting: Obstetrics and Gynecology

## 2017-05-25 ENCOUNTER — Encounter (INDEPENDENT_AMBULATORY_CARE_PROVIDER_SITE_OTHER): Payer: Self-pay

## 2017-05-25 ENCOUNTER — Ambulatory Visit (INDEPENDENT_AMBULATORY_CARE_PROVIDER_SITE_OTHER): Payer: Medicaid Other | Admitting: Obstetrics and Gynecology

## 2017-05-25 VITALS — BP 103/72 | HR 90 | Ht 63.0 in | Wt 158.9 lb

## 2017-05-25 DIAGNOSIS — N926 Irregular menstruation, unspecified: Secondary | ICD-10-CM

## 2017-05-25 LAB — POCT URINE PREGNANCY: Preg Test, Ur: POSITIVE — AB

## 2017-05-25 NOTE — Progress Notes (Signed)
HPI:      [name] is a 27 y.o. [phone] who LMP was Patient's last menstrual period was 03/10/2016 (lmp unknown).  Subjective:   She presents today after missing her menstrual period.  She believes she may be pregnant.  Of significant that she has had multiple previous miscarriages and is in the middle of a workup for recurrent pregnancy loss.  All of her miscarriages have been with the same partner.  Her workup to date has been negative.  He has been loath to undergo testing for infertility especially for items that may not be medically treatable.    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 She does not have any pertinent problems on file. She  has a past surgical history that includes Tonsillectomy; Dilation and evacuation (N/A, 12/02/2016); and Dilation and curettage of uterus. Her family history includes Heart failure in her maternal grandmother and maternal uncle; Hypertension in her paternal grandmother; Kidney disease in her maternal uncle; Thyroid disease in her paternal aunt. She  reports that she has been smoking cigarettes.  She has been smoking about 0.50 packs per day. She has never used smokeless tobacco. She reports that she drinks alcohol. She reports that she does not use drugs. She currently has no medications in their medication list. She is allergic to aspirin; ibuprofen; and amoxicillin.       Review of Systems:  Review of Systems  Constitutional: Denied constitutional symptoms, night sweats, recent illness, fatigue, fever, insomnia and weight loss.  Eyes: Denied eye symptoms, eye pain, photophobia, vision change and visual disturbance.  Ears/Nose/Throat/Neck: Denied ear, nose, throat or neck  symptoms, hearing loss, nasal discharge, sinus congestion and sore throat.  Cardiovascular: Denied cardiovascular symptoms, arrhythmia, chest pain/pressure, edema, exercise intolerance, orthopnea and palpitations.  Respiratory: Denied pulmonary symptoms, asthma, pleuritic pain, productive sputum, cough, dyspnea and wheezing.  Gastrointestinal: Denied, gastro-esophageal reflux, melena, nausea and vomiting.  Genitourinary: Denied genitourinary symptoms including symptomatic vaginal discharge, pelvic relaxation issues, and urinary complaints.  Musculoskeletal: Denied musculoskeletal symptoms, stiffness, swelling, muscle weakness and myalgia.  Dermatologic: Denied dermatology symptoms, rash and scar.  Neurologic: Denied neurology symptoms, dizziness, headache, neck pain and syncope.  Psychiatric: Denied psychiatric symptoms, anxiety and depression.  Endocrine: Denied endocrine symptoms including hot flashes and night sweats.   Meds:   No current outpatient medications on file prior to visit.   No current facility-administered medications on file prior to visit.     Objective:     Vitals:   05/25/17 1442  BP: 103/72  Pulse: 90              Urinary beta-hCG - "faintly positive"  Assessment:    G10P0090 Patient Active Problem List   Diagnosis Date Noted  . 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 Smoker 11/22/2014  . Recurrent vaginitis 11/22/2014  . 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 CD (Crohn's disease) ([place]) 07/30/2014  . Body mass index (BMI) of 27.0-27.9 in adult 10/03/2013     1. Missed menses     Faint positive pregnancy test   Plan:            1.  Serum beta hCG -begin  prenatal vitamins  2.  Follow-up beta hCG or ultrasound depending on levels.  3.  Discussed female infertility testing especially karyotype with patient and partner.  Multiple questions answered.  Also discussed possibility of referral to  REI.  Orders Orders Placed This Encounter  Procedures  . Beta hCG quant (ref lab)  . POCT urine pregnancy    No orders of the defined types were placed in this encounter.     F/U  No follow-ups on file. I spent 18 minutes with this patient of which greater than 50% was spent discussing current pregnancy and possibility of loss.  Signs and symptoms of miscarriage.  Possible future karyotype for partner.  Possible future referral to REI.  Multiple questions answered.  Finis Bud, M.D. 05/25/2017 3:22 PM

## 2017-05-26 LAB — BETA HCG QUANT (REF LAB): hCG Quant: 147 m[IU]/mL

## 2017-06-01 ENCOUNTER — Other Ambulatory Visit: Payer: Self-pay | Admitting: Certified Nurse Midwife

## 2017-06-01 ENCOUNTER — Other Ambulatory Visit: Payer: Self-pay

## 2017-06-01 ENCOUNTER — Other Ambulatory Visit: Payer: Medicaid Other

## 2017-06-01 DIAGNOSIS — O200 Threatened abortion: Secondary | ICD-10-CM

## 2017-06-02 LAB — BETA HCG QUANT (REF LAB): HCG QUANT: 752 m[IU]/mL

## 2017-06-04 ENCOUNTER — Telehealth: Payer: Self-pay | Admitting: Obstetrics and Gynecology

## 2017-06-04 NOTE — Telephone Encounter (Signed)
Pt wants to know if she need to take progesterone. Pt aware will need to ask DJE. Aware he will get this message on Tuesday.  Pregnancy verification letter printed and left up front for p/u.

## 2017-06-04 NOTE — Telephone Encounter (Signed)
The patient called and stated that she needs to speak with a nurse in regards to her needing to take progesterone and also her needing a proof of pregnancy form to take to social services to get pregnancy medicaid again. No other information was disclosed. Please advise.

## 2017-06-04 NOTE — Telephone Encounter (Signed)
The patient called and state that she was checking on the status/results of her Beta results. Please advise.

## 2017-06-04 NOTE — Telephone Encounter (Signed)
See result note.  

## 2017-06-07 ENCOUNTER — Telehealth: Payer: Self-pay | Admitting: Obstetrics and Gynecology

## 2017-06-07 ENCOUNTER — Other Ambulatory Visit: Payer: Medicaid Other

## 2017-06-07 DIAGNOSIS — O200 Threatened abortion: Secondary | ICD-10-CM

## 2017-06-07 NOTE — Telephone Encounter (Signed)
The patient called and stated that she would like to speak with Dr. Marcelline Mates in regards to her taking Progesterone due to her having several miscarriages. The patient was informed by Dr. Amalia Hailey that it wouldn't make a difference and it wouldn't be useful moving forward, but the patient would like a second opinion. No other information was disclosed. Please advise.

## 2017-06-07 NOTE — Telephone Encounter (Signed)
Pt is aware.  

## 2017-06-07 NOTE — Telephone Encounter (Signed)
LMTRC

## 2017-06-08 ENCOUNTER — Other Ambulatory Visit: Payer: Self-pay | Admitting: Obstetrics and Gynecology

## 2017-06-08 ENCOUNTER — Telehealth: Payer: Self-pay | Admitting: Obstetrics and Gynecology

## 2017-06-08 DIAGNOSIS — N926 Irregular menstruation, unspecified: Secondary | ICD-10-CM

## 2017-06-08 LAB — BETA HCG QUANT (REF LAB): HCG QUANT: 4126 m[IU]/mL

## 2017-06-08 NOTE — Telephone Encounter (Signed)
See beta results.

## 2017-06-08 NOTE — Telephone Encounter (Signed)
Pt was called back she stated that she wanted to have her progesterone levels checked again. Was going to sent pt to the front desk resp to help her make an appt to have those level checked but pt decided to call back on tomorrow and make an appt.

## 2017-06-08 NOTE — Telephone Encounter (Signed)
Pls review beta and I will contact pt.  U/s and f/u???? pls advise.

## 2017-06-08 NOTE — Telephone Encounter (Signed)
The patient called and stated that she is checking on the status of her most recent Beta labs that were drawn on 06/07/2017. No other information was disclosed. Please advise.

## 2017-06-09 ENCOUNTER — Ambulatory Visit (INDEPENDENT_AMBULATORY_CARE_PROVIDER_SITE_OTHER): Payer: Self-pay

## 2017-06-09 DIAGNOSIS — N926 Irregular menstruation, unspecified: Secondary | ICD-10-CM

## 2017-06-10 ENCOUNTER — Other Ambulatory Visit: Payer: Self-pay | Admitting: Obstetrics and Gynecology

## 2017-06-10 DIAGNOSIS — N926 Irregular menstruation, unspecified: Secondary | ICD-10-CM

## 2017-06-18 ENCOUNTER — Telehealth: Payer: Self-pay | Admitting: Obstetrics and Gynecology

## 2017-06-18 NOTE — Telephone Encounter (Signed)
The patient called and stated that she would like to speak with a nurse in regards to her continuously spotting since her last appointment. The patient would like to discuss her concerns and if she is fine to just be seen at her next scheduled appointment or sooner. Please advise.

## 2017-06-18 NOTE — Telephone Encounter (Signed)
lmtrc

## 2017-06-21 NOTE — Telephone Encounter (Signed)
Pt notes spotting only when she wipes after urination since her u/s about 10 days ago. NO cramps. She has not had any spotting for the last 2 days. NO uti sx. BM are normal. She did d/x her pnv because it gave her loose stools. Advised pt p/u otc pnv. Pt ot monitor her bleeding. If her bleeding increases or she has cramps not relived with tylenol to contact the office. Pt has f/u appt on 06/23/2017.

## 2017-06-23 ENCOUNTER — Ambulatory Visit (INDEPENDENT_AMBULATORY_CARE_PROVIDER_SITE_OTHER): Payer: Medicaid Other | Admitting: Obstetrics and Gynecology

## 2017-06-23 ENCOUNTER — Encounter: Payer: Self-pay | Admitting: Obstetrics and Gynecology

## 2017-06-23 ENCOUNTER — Other Ambulatory Visit: Payer: Self-pay | Admitting: Obstetrics and Gynecology

## 2017-06-23 ENCOUNTER — Ambulatory Visit (INDEPENDENT_AMBULATORY_CARE_PROVIDER_SITE_OTHER): Payer: Self-pay

## 2017-06-23 VITALS — BP 98/65 | HR 84 | Ht 63.0 in | Wt 159.4 lb

## 2017-06-23 DIAGNOSIS — O200 Threatened abortion: Secondary | ICD-10-CM

## 2017-06-23 DIAGNOSIS — N926 Irregular menstruation, unspecified: Secondary | ICD-10-CM

## 2017-06-23 NOTE — Progress Notes (Signed)
HPI:      [name] is a 27 y.o. [phone] who LMP was Patient's last menstrual period was 03/10/2016 (lmp unknown).  Subjective:   She presents today after having an ultrasound which revealed no change in gestational sac no fetal heart tones.  Her ultrasound 2 weeks ago revealed 2 sacs which were empty except for yolk sacs.  Today's ultrasound reveals no change in sac possible fetal pole and one of the sacs but no heart tones detected. Patient continues to have daily light spotting.    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 She does not have any pertinent problems on file. She  has a past surgical history that includes Tonsillectomy; Dilation and evacuation (N/A, 12/02/2016); and Dilation and curettage of uterus. Her family history includes Heart failure in her maternal grandmother and maternal uncle; Hypertension in her paternal grandmother; Kidney disease in her maternal uncle; Thyroid disease in her paternal aunt. She  reports that she has been smoking cigarettes.  She has been smoking about 0.50 packs per day. She has never used smokeless tobacco. She reports that she drinks alcohol. She reports that she does not use drugs. She currently has no medications in their medication list. She is allergic to aspirin; ibuprofen; and amoxicillin.       Review of Systems:  Review of Systems  Constitutional: Denied constitutional symptoms, night sweats, recent illness, fatigue, fever, insomnia and weight loss.  Eyes: Denied eye symptoms, eye pain, photophobia, vision change and visual disturbance.  Ears/Nose/Throat/Neck: Denied ear, nose, throat or neck symptoms, hearing loss, nasal discharge, sinus congestion and sore  throat.  Cardiovascular: Denied cardiovascular symptoms, arrhythmia, chest pain/pressure, edema, exercise intolerance, orthopnea and palpitations.  Respiratory: Denied pulmonary symptoms, asthma, pleuritic pain, productive sputum, cough, dyspnea and wheezing.  Gastrointestinal: Denied, gastro-esophageal reflux, melena, nausea and vomiting.  Genitourinary: Denied genitourinary symptoms including symptomatic vaginal discharge, pelvic relaxation issues, and urinary complaints.  Musculoskeletal: Denied musculoskeletal symptoms, stiffness, swelling, muscle weakness and myalgia.  Dermatologic: Denied dermatology symptoms, rash and scar.  Neurologic: Denied neurology symptoms, dizziness, headache, neck pain and syncope.  Psychiatric: Denied psychiatric symptoms, anxiety and depression.  Endocrine: Denied endocrine symptoms including hot flashes and night sweats.   Meds:   No current outpatient medications on file prior to visit.   No current facility-administered medications on file prior to visit.     Objective:     Vitals:   06/23/17 1133  BP: 98/65  Pulse: 84              Ultrasound results reviewed directly with the patient.  Assessment:    [id number] Patient Active Problem List   Diagnosis Date Noted  . 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 Smoker 11/22/2014  . Recurrent vaginitis 11/22/2014  . 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 CD (Crohn's disease) ([place]) 07/30/2014  . Body mass index (BMI) of 27.0-27.9 in adult 10/03/2013     1. Threatened abortion     Likely missed AB based on current appearance of ultrasound and history.  Patient not entirely convinced and would like to wait and repeat an ultrasound next week "just to be sure".   Plan:  1.  Warnings regarding miscarriage discussed in detail.  Options regarding D&E versus misoprostol discussed.  We will wait until confirmation ultrasound next  week.  2.  Patient advised to follow-up with REI for future fertility/infertility/recurrent pregnancy loss issues.  3.  Patient now requesting paternal chromosomes for possible translocation. Orders No orders of the defined types were placed in this encounter.   No orders of the defined types were placed in this encounter.     F/U  No follow-ups on file. I spent 16 minutes with this patient of which greater than 50% was spent discussing pregnancy loss, current ultrasound findings, follow-up, recommended referrals.  Finis Bud, M.D. 06/23/2017 11:56 AM

## 2017-06-28 ENCOUNTER — Ambulatory Visit (INDEPENDENT_AMBULATORY_CARE_PROVIDER_SITE_OTHER): Payer: Self-pay

## 2017-06-28 DIAGNOSIS — O200 Threatened abortion: Secondary | ICD-10-CM

## 2017-06-30 ENCOUNTER — Ambulatory Visit: Payer: Self-pay | Admitting: Anesthesiology

## 2017-06-30 ENCOUNTER — Encounter: Payer: Self-pay | Admitting: Obstetrics and Gynecology

## 2017-06-30 ENCOUNTER — Encounter
Admission: RE | Disposition: A | Discharge status: Home / Self Care | Payer: Self-pay | Source: Ambulatory Visit | Attending: Obstetrics and Gynecology

## 2017-06-30 ENCOUNTER — Ambulatory Visit
Admission: RE | Admit: 2017-06-30 | Discharge: 2017-06-30 | Disposition: A | Discharge status: Home / Self Care | Payer: Self-pay | Source: Ambulatory Visit | Attending: Obstetrics and Gynecology | Admitting: Obstetrics and Gynecology

## 2017-06-30 ENCOUNTER — Other Ambulatory Visit: Payer: Self-pay

## 2017-06-30 ENCOUNTER — Ambulatory Visit (INDEPENDENT_AMBULATORY_CARE_PROVIDER_SITE_OTHER): Payer: Medicaid Other | Admitting: Obstetrics and Gynecology

## 2017-06-30 VITALS — BP 94/62 | HR 89 | Ht 63.0 in | Wt 160.4 lb

## 2017-06-30 DIAGNOSIS — Z880 Allergy status to penicillin: Secondary | ICD-10-CM | POA: Insufficient documentation

## 2017-06-30 DIAGNOSIS — Z8249 Family history of ischemic heart disease and other diseases of the circulatory system: Secondary | ICD-10-CM | POA: Insufficient documentation

## 2017-06-30 DIAGNOSIS — Z886 Allergy status to analgesic agent status: Secondary | ICD-10-CM | POA: Insufficient documentation

## 2017-06-30 DIAGNOSIS — O034 Incomplete spontaneous abortion without complication: Secondary | ICD-10-CM | POA: Insufficient documentation

## 2017-06-30 DIAGNOSIS — F17210 Nicotine dependence, cigarettes, uncomplicated: Secondary | ICD-10-CM | POA: Insufficient documentation

## 2017-06-30 DIAGNOSIS — K5090 Crohn's disease, unspecified, without complications: Secondary | ICD-10-CM | POA: Insufficient documentation

## 2017-06-30 DIAGNOSIS — J45909 Unspecified asthma, uncomplicated: Secondary | ICD-10-CM | POA: Insufficient documentation

## 2017-06-30 HISTORY — PX: DILATION AND EVACUATION: SHX1459

## 2017-06-30 LAB — TYPE AND SCREEN
ABO/RH(D): A POS
Antibody Screen: NEGATIVE

## 2017-06-30 LAB — CBC
HCT: 38.6 % (ref 35.0–47.0)
Hemoglobin: 13.2 g/dL (ref 12.0–16.0)
MCH: 30.4 pg (ref 26.0–34.0)
MCHC: 34.2 g/dL (ref 32.0–36.0)
MCV: 88.7 fL (ref 80.0–100.0)
PLATELETS: 282 10*3/uL (ref 150–440)
RBC: 4.35 MIL/uL (ref 3.80–5.20)
RDW: 14 % (ref 11.5–14.5)
WBC: 17.7 10*3/uL — AB (ref 3.6–11.0)

## 2017-06-30 SURGERY — DILATION AND EVACUATION, UTERUS
Anesthesia: General | Site: Uterus | Wound class: "Clean Contaminated "

## 2017-06-30 MED ORDER — HYDROMORPHONE HCL 1 MG/ML IJ SOLN
INTRAMUSCULAR | Status: AC
  Administered 2017-06-30: 0.5 mg via INTRAVENOUS
  Filled 2017-06-30: qty 1

## 2017-06-30 MED ORDER — HYDROMORPHONE HCL 1 MG/ML IJ SOLN
0.2500 mg | INTRAMUSCULAR | Status: DC | PRN
  Administered 2017-06-30 (×4): 0.5 mg via INTRAVENOUS

## 2017-06-30 MED ORDER — GLYCOPYRROLATE 0.2 MG/ML IJ SOLN
INTRAMUSCULAR | Status: DC | PRN
  Administered 2017-06-30: 0.2 mg via INTRAVENOUS

## 2017-06-30 MED ORDER — MIDAZOLAM HCL 2 MG/2ML IJ SOLN
INTRAMUSCULAR | Status: DC | PRN
  Administered 2017-06-30: 2 mg via INTRAVENOUS

## 2017-06-30 MED ORDER — ONDANSETRON HCL 4 MG/2ML IJ SOLN
INTRAMUSCULAR | Status: DC | PRN
  Administered 2017-06-30: 4 mg via INTRAVENOUS

## 2017-06-30 MED ORDER — PHENYLEPHRINE HCL 10 MG/ML IJ SOLN
INTRAMUSCULAR | Status: DC | PRN
  Administered 2017-06-30: 100 ug via INTRAVENOUS

## 2017-06-30 MED ORDER — LACTATED RINGERS IV SOLN
INTRAVENOUS | Status: DC
  Administered 2017-06-30: 12:00:00 via INTRAVENOUS

## 2017-06-30 MED ORDER — DEXAMETHASONE SODIUM PHOSPHATE 10 MG/ML IJ SOLN
INTRAMUSCULAR | Status: DC | PRN
  Administered 2017-06-30: 10 mg via INTRAVENOUS

## 2017-06-30 MED ORDER — PROPOFOL 10 MG/ML IV BOLUS
INTRAVENOUS | Status: DC | PRN
  Administered 2017-06-30: 100 mg via INTRAVENOUS
  Administered 2017-06-30: 50 mg via INTRAVENOUS
  Administered 2017-06-30: 150 mg via INTRAVENOUS

## 2017-06-30 MED ORDER — OXYTOCIN 10 UNIT/ML IJ SOLN
INTRAMUSCULAR | Status: AC
  Filled 2017-06-30: qty 1

## 2017-06-30 MED ORDER — HYDROCODONE-ACETAMINOPHEN 7.5-325 MG PO TABS
ORAL_TABLET | ORAL | Status: AC
  Filled 2017-06-30: qty 1

## 2017-06-30 MED ORDER — PROMETHAZINE HCL 25 MG/ML IJ SOLN: 6.2500 mg | INTRAMUSCULAR | Status: DC | PRN

## 2017-06-30 MED ORDER — FENTANYL CITRATE (PF) 100 MCG/2ML IJ SOLN
INTRAMUSCULAR | Status: DC | PRN
  Administered 2017-06-30 (×2): 50 ug via INTRAVENOUS

## 2017-06-30 MED ORDER — HYDROMORPHONE HCL 1 MG/ML IJ SOLN
INTRAMUSCULAR | Status: AC
  Administered 2017-06-30: 0.5 mg via INTRAVENOUS
  Filled 2017-06-30: qty 0.5

## 2017-06-30 MED ORDER — DEXMEDETOMIDINE HCL 200 MCG/2ML IV SOLN
INTRAVENOUS | Status: DC | PRN
  Administered 2017-06-30: 4 ug via INTRAVENOUS

## 2017-06-30 MED ORDER — LIDOCAINE HCL (CARDIAC) PF 100 MG/5ML IV SOSY
PREFILLED_SYRINGE | INTRAVENOUS | Status: DC | PRN
  Administered 2017-06-30: 100 mg via INTRAVENOUS

## 2017-06-30 MED ORDER — HYDROCODONE-ACETAMINOPHEN 7.5-325 MG PO TABS
1.0000 | ORAL_TABLET | Freq: Once | ORAL | Status: AC | PRN
  Administered 2017-06-30: 1 via ORAL
  Filled 2017-06-30: qty 1

## 2017-06-30 MED ORDER — ACETAMINOPHEN-CODEINE #3 300-30 MG PO TABS: 1.0000 | ORAL_TABLET | ORAL | 0 refills | Status: DC | PRN

## 2017-06-30 MED ORDER — MEPERIDINE HCL 50 MG/ML IJ SOLN: 6.2500 mg | INTRAMUSCULAR | Status: DC | PRN

## 2017-06-30 MED ORDER — OXYTOCIN 10 UNIT/ML IJ SOLN
INTRAVENOUS | Status: DC | PRN
  Administered 2017-06-30: 30 [IU] via INTRAVENOUS

## 2017-06-30 SURGICAL SUPPLY — 26 items
ADAPTER VACURETTE TBG SET 14 (CANNULA) ×2 IMPLANT
CATH ROBINSON RED A/P 16FR (CATHETERS) ×2 IMPLANT
DRSG TELFA 3X8 NADH (GAUZE/BANDAGES/DRESSINGS) ×2 IMPLANT
FILTER UTR ASPR SPEC (MISCELLANEOUS) ×1 IMPLANT
FLTR UTR ASPR SPEC (MISCELLANEOUS) ×2
GLOVE BIOGEL PI ORTHO PRO 7.5 (GLOVE) ×1
GLOVE PI ORTHO PRO STRL 7.5 (GLOVE) ×1 IMPLANT
GOWN STRL REUS W/ TWL LRG LVL3 (GOWN DISPOSABLE) ×2 IMPLANT
GOWN STRL REUS W/TWL LRG LVL3 (GOWN DISPOSABLE) ×2
KIT BERKELEY 1ST TRIMESTER 3/8 (MISCELLANEOUS) ×2 IMPLANT
KIT TURNOVER KIT A (KITS) ×2 IMPLANT
NDL HYPO 25X1 1.5 SAFETY (NEEDLE) ×1 IMPLANT
NEEDLE HYPO 25X1 1.5 SAFETY (NEEDLE) ×2 IMPLANT
PACK DNC HYST (MISCELLANEOUS) ×2 IMPLANT
PAD DRESSING TELFA 3X8 NADH (GAUZE/BANDAGES/DRESSINGS) ×1 IMPLANT
PAD OB MATERNITY 4.3X12.25 (PERSONAL CARE ITEMS) ×2 IMPLANT
PAD PREP 24X41 OB/GYN DISP (PERSONAL CARE ITEMS) ×2 IMPLANT
SET BERKELEY SUCTION TUBING (SUCTIONS) ×2 IMPLANT
SOL PREP PVP 2OZ (MISCELLANEOUS) ×2
SOLUTION PREP PVP 2OZ (MISCELLANEOUS) ×1 IMPLANT
SPONGE XRAY 4X4 16PLY STRL (MISCELLANEOUS) ×2 IMPLANT
VACURETTE 10 RIGID CVD (CANNULA) ×2 IMPLANT
VACURETTE 12 RIGID CVD (CANNULA) ×2 IMPLANT
VACURETTE 7MM F TIP (CANNULA) ×1
VACURETTE 7MM F TIP STRL (CANNULA) ×1 IMPLANT
VACURETTE 8 RIGID CVD (CANNULA) ×2 IMPLANT

## 2017-06-30 NOTE — Anesthesia Post-op Follow-up Note (Signed)
Anesthesia QCDR form completed.        

## 2017-06-30 NOTE — Anesthesia Preprocedure Evaluation (Addendum)
Anesthesia Evaluation  Patient identified by MRN, date of birth, ID band Patient awake    Reviewed: Allergy & Precautions, H&P , NPO status , reviewed documented beta blocker date and time   Airway Mallampati: II  TM Distance: >3 FB Neck ROM: full    Dental  (+) Teeth Intact   Pulmonary asthma , Current Smoker,    Pulmonary exam normal        Cardiovascular negative cardio ROS Normal cardiovascular exam     Neuro/Psych negative neurological ROS  negative psych ROS   GI/Hepatic negative GI ROS, Neg liver ROS, neg GERD  ,Crohn's   Endo/Other  negative endocrine ROS  Renal/GU      Musculoskeletal   Abdominal   Peds  Hematology negative hematology ROS (+)   Anesthesia Other Findings Past Medical History: 07/30/2014: Airway hyperreactivity August 2016: Bacterial vaginosis     Comment:  recurrent BV 07/30/2014: CD (Crohn's disease) ([place]) Jan 2016: Crohn's disease ([place]) 07/18/2014: Miscarriage  Past Surgical History: No date: DILATION AND CURETTAGE OF UTERUS 12/02/2016: DILATION AND EVACUATION; N/A     Comment:  Procedure: DILATATION AND EVACUATION;  Surgeon: Harlin Heys, MD;  Location: [facility] ORS;  Service:               Gynecology;  Laterality: N/A; No date: TONSILLECTOMY     Reproductive/Obstetrics                            Anesthesia Physical Anesthesia Plan  ASA: II  Anesthesia Plan: General   Post-op Pain Management:    Induction:   PONV Risk Score and Plan: 3 and Ondansetron, Midazolam and Metaclopromide  Airway Management Planned: LMA  Additional Equipment:   Intra-op Plan:   Post-operative Plan: Extubation in OR  Informed Consent: I have reviewed the patients History and Physical, chart, labs and discussed the procedure including the risks, benefits and alternatives for the proposed anesthesia with the patient or authorized representative who has  indicated his/her understanding and acceptance.   Dental Advisory Given  Plan Discussed with: CRNA  Anesthesia Plan Comments:        Anesthesia Quick Evaluation

## 2017-06-30 NOTE — Anesthesia Procedure Notes (Signed)
Procedure Name: LMA Insertion Date/Time: 06/30/2017 12:08 PM Performed by: Aline Brochure, CRNA Pre-anesthesia Checklist: Patient identified, Emergency Drugs available, Suction available and Patient being monitored Patient Re-evaluated:Patient Re-evaluated prior to induction Oxygen Delivery Method: Circle system utilized Preoxygenation: Pre-oxygenation with 100% oxygen Induction Type: IV induction Ventilation: Mask ventilation without difficulty LMA: LMA inserted LMA Size: 3.5 Number of attempts: 1 Placement Confirmation: positive ETCO2 and breath sounds checked- equal and bilateral Tube secured with: Tape Dental Injury: Teeth and Oropharynx as per pre-operative assessment

## 2017-06-30 NOTE — H&P (View-Only) (Signed)
PRE-OPERATIVE HISTORY AND PHYSICAL EXAM  PCP:  Stoney Bang, MD Subjective:   HPI:  Kathleen Owen is a 27 y.o. [phone].  Patient's last menstrual period was 03/10/2016 (lmp unknown).  She presents today for a pre-op discussion and PE.  She has the following symptoms: Severe abdominal cramping vaginal bleeding.  Review of Systems:   Constitutional: Denied constitutional symptoms, night sweats, recent illness, fatigue, fever, insomnia and weight loss.  Eyes: Denied eye symptoms, eye pain, photophobia, vision change and visual disturbance.  Ears/Nose/Throat/Neck: Denied ear, nose, throat or neck symptoms, hearing loss, nasal discharge, sinus congestion and sore throat.  Cardiovascular: Denied cardiovascular symptoms, arrhythmia, chest pain/pressure, edema, exercise intolerance, orthopnea and palpitations.  Respiratory: Denied pulmonary symptoms, asthma, pleuritic pain, productive sputum, cough, dyspnea and wheezing.  Gastrointestinal: Denied, gastro-esophageal reflux, melena, nausea and vomiting.  Genitourinary: See HPI for additional information.  Musculoskeletal: Denied musculoskeletal symptoms, stiffness, swelling, muscle weakness and myalgia.  Dermatologic: Denied dermatology symptoms, rash and scar.  Neurologic: Denied neurology symptoms, dizziness, headache, neck pain and syncope.  Psychiatric: Denied psychiatric symptoms, anxiety and depression.  Endocrine: Denied endocrine symptoms including hot flashes and night sweats.   OB History  Gravida Para Term Preterm AB Living  10       9    SAB TAB Ectopic Multiple Live Births  7 1          # Outcome Date GA Lbr Len/2nd Weight Sex Delivery Anes PTL Lv  10 Current           9 SAB 2018          8 SAB 2017          7 SAB 07/22/14          6 SAB 04/23/14          5 SAB 03/23/14          4 SAB 03/23/13          3 TAB 07/21/08          2 AB 2007     TAB     1 SAB         ND    Past Medical History:  Diagnosis  Date  . 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 CD (Crohn's disease) ([place]) 07/30/2014  . Crohn's disease [facility]) Jan 2016  . Miscarriage 07/18/2014    Past Surgical History:  Procedure Laterality Date  . DILATION AND CURETTAGE OF UTERUS    . DILATION AND EVACUATION N/A 12/02/2016   Procedure: DILATATION AND EVACUATION;  Surgeon: Harlin Heys, MD;  Location: [facility] ORS;  Service: Gynecology;  Laterality: N/A;  . TONSILLECTOMY        SOCIAL HISTORY:  Social History   Tobacco Use  Smoking Status Current Every Day Smoker  . Packs/day: 0.50  . Types: Cigarettes  Smokeless Tobacco Never Used   Social History   Substance and Sexual Activity  Alcohol Use Yes   Comment: occasionally    Social History   Substance and Sexual Activity  Drug Use No    Family History  Problem Relation Age of Onset  . Kidney disease Maternal Uncle   . Heart failure Maternal Uncle   . Heart failure Maternal Grandmother   . Hypertension Paternal Grandmother   . Thyroid disease Paternal Aunt     ALLERGIES:  Aspirin; Ibuprofen; and Amoxicillin  MEDS:   No current  outpatient medications on file prior to visit.   No current facility-administered medications on file prior to visit.     No orders of the defined types were placed in this encounter.    Physical examination BP 94/62   Pulse 89   Ht 5' 3"  (1.6 m)   Wt 160 lb 6.4 oz (72.8 kg)   LMP 03/10/2016 (LMP Unknown)   BMI 28.41 kg/m   General NAD, Conversant  HEENT Atraumatic; Op clear with mmm.  Normo-cephalic. Pupils reactive. Anicteric sclerae  Thyroid/Neck Smooth without nodularity or enlargement. Normal ROM.  Neck Supple.  Skin No rashes, lesions or ulceration. Normal palpated skin turgor. No nodularity.  Breasts:   Lungs: Clear to auscultation.No rales or wheezes. Normal Respiratory effort, no retractions.  Heart: NSR.  No murmurs or rubs appreciated. No periferal edema    Abdomen: Soft.  Non-tender.  No masses.  No HSM. No hernia  Extremities: Moves all appropriately.  Normal ROM for age. No lymphadenopathy.  Neuro: Oriented to PPT.  Normal mood. Normal affect.     Pelvic:  Pelvic examination deferred to ultrasound findings.  Will be performed in the OR.      Assessment:   [id number] Patient Active Problem List   Diagnosis Date Noted  . 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 Smoker 11/22/2014  . Recurrent vaginitis 11/22/2014  . 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 CD (Crohn's disease) ([place]) 07/30/2014  . Body mass index (BMI) of 27.0-27.9 in adult 10/03/2013    1. Incomplete abortion    Patient currently having significant cramping and bleeding.  Ultrasound reveals nonviable intrauterine pregnancy  Plan:   Orders: No orders of the defined types were placed in this encounter.    1.  D&E  Pre-op discussions regarding Risks and Benefits of her scheduled surgery.  D&E The procedure and the risks and benefits of dilation and curettage/evacuation have been explained to the patient.  The specific risks of bleeding, infection, anesthesia, uterine perforation, and damage to bowel or bladder  have been specifically discussed.  I have answered all of her questions and I believe that she has an adequate and informed understanding of this procedure.   Finis Bud, M.D. 06/30/2017 10:32 AM

## 2017-06-30 NOTE — Discharge Instructions (Signed)

## 2017-06-30 NOTE — Interval H&P Note (Signed)
History and Physical Interval Note:  06/30/2017 11:36 AM  Kathleen Owen  has presented today for surgery, with the diagnosis of MISSED AB  The various methods of treatment have been discussed with the patient and family. After consideration of risks, benefits and other options for treatment, the patient has consented to  Procedure(s): DILATATION AND EVACUATION (N/A) as a surgical intervention .  The patient's history has been reviewed, patient examined, no change in status, stable for surgery.  I have reviewed the patient's chart and labs.  Questions were answered to the patient's satisfaction.     Jeannie Fend

## 2017-06-30 NOTE — H&P (Signed)
PRE-OPERATIVE HISTORY AND PHYSICAL EXAM  PCP:  Stoney Bang, MD Subjective:   HPI:  Kathleen Owen is a 27 y.o. [phone].  Patient's last menstrual period was 03/10/2016 (lmp unknown).  She presents today for a pre-op discussion and PE.  She has the following symptoms: Severe abdominal cramping vaginal bleeding.  Review of Systems:   Constitutional: Denied constitutional symptoms, night sweats, recent illness, fatigue, fever, insomnia and weight loss.  Eyes: Denied eye symptoms, eye pain, photophobia, vision change and visual disturbance.  Ears/Nose/Throat/Neck: Denied ear, nose, throat or neck symptoms, hearing loss, nasal discharge, sinus congestion and sore throat.  Cardiovascular: Denied cardiovascular symptoms, arrhythmia, chest pain/pressure, edema, exercise intolerance, orthopnea and palpitations.  Respiratory: Denied pulmonary symptoms, asthma, pleuritic pain, productive sputum, cough, dyspnea and wheezing.  Gastrointestinal: Denied, gastro-esophageal reflux, melena, nausea and vomiting.  Genitourinary: See HPI for additional information.  Musculoskeletal: Denied musculoskeletal symptoms, stiffness, swelling, muscle weakness and myalgia.  Dermatologic: Denied dermatology symptoms, rash and scar.  Neurologic: Denied neurology symptoms, dizziness, headache, neck pain and syncope.  Psychiatric: Denied psychiatric symptoms, anxiety and depression.  Endocrine: Denied endocrine symptoms including hot flashes and night sweats.   OB History  Gravida Para Term Preterm AB Living  10       9    SAB TAB Ectopic Multiple Live Births  7 1          # Outcome Date GA Lbr Len/2nd Weight Sex Delivery Anes PTL Lv  10 Current           9 SAB 2018          8 SAB 2017          7 SAB 07/22/14          6 SAB 04/23/14          5 SAB 03/23/14          4 SAB 03/23/13          3 TAB 07/21/08          2 AB 2007     TAB     1 SAB         ND    Past Medical History:  Diagnosis  Date  . 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 CD (Crohn's disease) ([place]) 07/30/2014  . Crohn's disease [facility]) Jan 2016  . Miscarriage 07/18/2014    Past Surgical History:  Procedure Laterality Date  . DILATION AND CURETTAGE OF UTERUS    . DILATION AND EVACUATION N/A 12/02/2016   Procedure: DILATATION AND EVACUATION;  Surgeon: Harlin Heys, MD;  Location: [facility] ORS;  Service: Gynecology;  Laterality: N/A;  . TONSILLECTOMY        SOCIAL HISTORY:  Social History   Tobacco Use  Smoking Status Current Every Day Smoker  . Packs/day: 0.50  . Types: Cigarettes  Smokeless Tobacco Never Used   Social History   Substance and Sexual Activity  Alcohol Use Yes   Comment: occasionally    Social History   Substance and Sexual Activity  Drug Use No    Family History  Problem Relation Age of Onset  . Kidney disease Maternal Uncle   . Heart failure Maternal Uncle   . Heart failure Maternal Grandmother   . Hypertension Paternal Grandmother   . Thyroid disease Paternal Aunt     ALLERGIES:  Aspirin; Ibuprofen; and Amoxicillin  MEDS:   No current  outpatient medications on file prior to visit.   No current facility-administered medications on file prior to visit.     No orders of the defined types were placed in this encounter.    Physical examination BP 94/62   Pulse 89   Ht 5' 3"  (1.6 m)   Wt 160 lb 6.4 oz (72.8 kg)   LMP 03/10/2016 (LMP Unknown)   BMI 28.41 kg/m   General NAD, Conversant  HEENT Atraumatic; Op clear with mmm.  Normo-cephalic. Pupils reactive. Anicteric sclerae  Thyroid/Neck Smooth without nodularity or enlargement. Normal ROM.  Neck Supple.  Skin No rashes, lesions or ulceration. Normal palpated skin turgor. No nodularity.  Breasts:   Lungs: Clear to auscultation.No rales or wheezes. Normal Respiratory effort, no retractions.  Heart: NSR.  No murmurs or rubs appreciated. No periferal edema    Abdomen: Soft.  Non-tender.  No masses.  No HSM. No hernia  Extremities: Moves all appropriately.  Normal ROM for age. No lymphadenopathy.  Neuro: Oriented to PPT.  Normal mood. Normal affect.     Pelvic:  Pelvic examination deferred to ultrasound findings.  Will be performed in the OR.      Assessment:   [id number] Patient Active Problem List   Diagnosis Date Noted  . 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 Smoker 11/22/2014  . Recurrent vaginitis 11/22/2014  . 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 CD (Crohn's disease) ([place]) 07/30/2014  . Body mass index (BMI) of 27.0-27.9 in adult 10/03/2013    1. Incomplete abortion    Patient currently having significant cramping and bleeding.  Ultrasound reveals nonviable intrauterine pregnancy  Plan:   Orders: No orders of the defined types were placed in this encounter.    1.  D&E  Pre-op discussions regarding Risks and Benefits of her scheduled surgery.  D&E The procedure and the risks and benefits of dilation and curettage/evacuation have been explained to the patient.  The specific risks of bleeding, infection, anesthesia, uterine perforation, and damage to bowel or bladder  have been specifically discussed.  I have answered all of her questions and I believe that she has an adequate and informed understanding of this procedure.   Finis Bud, M.D. 06/30/2017 10:32 AM

## 2017-06-30 NOTE — Progress Notes (Signed)
PRE-OPERATIVE HISTORY AND PHYSICAL EXAM  PCP:  Stoney Bang, MD Subjective:   HPI:  Kathleen Owen is a 27 y.o. [phone].  Patient's last menstrual period was 03/10/2016 (lmp unknown).  She presents today for a pre-op discussion and PE.  She has the following symptoms: Severe abdominal cramping vaginal bleeding.  Review of Systems:   Constitutional: Denied constitutional symptoms, night sweats, recent illness, fatigue, fever, insomnia and weight loss.  Eyes: Denied eye symptoms, eye pain, photophobia, vision change and visual disturbance.  Ears/Nose/Throat/Neck: Denied ear, nose, throat or neck symptoms, hearing loss, nasal discharge, sinus congestion and sore throat.  Cardiovascular: Denied cardiovascular symptoms, arrhythmia, chest pain/pressure, edema, exercise intolerance, orthopnea and palpitations.  Respiratory: Denied pulmonary symptoms, asthma, pleuritic pain, productive sputum, cough, dyspnea and wheezing.  Gastrointestinal: Denied, gastro-esophageal reflux, melena, nausea and vomiting.  Genitourinary: See HPI for additional information.  Musculoskeletal: Denied musculoskeletal symptoms, stiffness, swelling, muscle weakness and myalgia.  Dermatologic: Denied dermatology symptoms, rash and scar.  Neurologic: Denied neurology symptoms, dizziness, headache, neck pain and syncope.  Psychiatric: Denied psychiatric symptoms, anxiety and depression.  Endocrine: Denied endocrine symptoms including hot flashes and night sweats.   OB History  Gravida Para Term Preterm AB Living  10       9    SAB TAB Ectopic Multiple Live Births  7 1          # Outcome Date GA Lbr Len/2nd Weight Sex Delivery Anes PTL Lv  10 Current           9 SAB 2018          8 SAB 2017          7 SAB 07/22/14          6 SAB 04/23/14          5 SAB 03/23/14          4 SAB 03/23/13          3 TAB 07/21/08          2 AB 2007     TAB     1 SAB         ND    Past Medical History:  Diagnosis  Date  . 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 CD (Crohn's disease) ([place]) 07/30/2014  . Crohn's disease [facility] Jan 2016  . Miscarriage 07/18/2014    Past Surgical History:  Procedure Laterality Date  . DILATION AND CURETTAGE OF UTERUS    . DILATION AND EVACUATION N/A 12/02/2016   Procedure: DILATATION AND EVACUATION;  Surgeon: Harlin Heys, MD;  Location: [facility] ORS;  Service: Gynecology;  Laterality: N/A;  . TONSILLECTOMY        SOCIAL HISTORY:  Social History   Tobacco Use  Smoking Status Current Every Day Smoker  . Packs/day: 0.50  . Types: Cigarettes  Smokeless Tobacco Never Used   Social History   Substance and Sexual Activity  Alcohol Use Yes   Comment: occasionally    Social History   Substance and Sexual Activity  Drug Use No    Family History  Problem Relation Age of Onset  . Kidney disease Maternal Uncle   . Heart failure Maternal Uncle   . Heart failure Maternal Grandmother   . Hypertension Paternal Grandmother   . Thyroid disease Paternal Aunt     ALLERGIES:  Aspirin; Ibuprofen; and Amoxicillin  MEDS:   No current  outpatient medications on file prior to visit.   No current facility-administered medications on file prior to visit.     No orders of the defined types were placed in this encounter.    Physical examination BP 94/62   Pulse 89   Ht 5' 3"  (1.6 m)   Wt 160 lb 6.4 oz (72.8 kg)   LMP 03/10/2016 (LMP Unknown)   BMI 28.41 kg/m   General NAD, Conversant  HEENT Atraumatic; Op clear with mmm.  Normo-cephalic. Pupils reactive. Anicteric sclerae  Thyroid/Neck Smooth without nodularity or enlargement. Normal ROM.  Neck Supple.  Skin No rashes, lesions or ulceration. Normal palpated skin turgor. No nodularity.  Breasts:   Lungs: Clear to auscultation.No rales or wheezes. Normal Respiratory effort, no retractions.  Heart: NSR.  No murmurs or rubs appreciated. No periferal edema    Abdomen: Soft.  Non-tender.  No masses.  No HSM. No hernia  Extremities: Moves all appropriately.  Normal ROM for age. No lymphadenopathy.  Neuro: Oriented to PPT.  Normal mood. Normal affect.     Pelvic:  Pelvic examination deferred to ultrasound findings.  Will be performed in the OR.      Assessment:   [id number] Patient Active Problem List   Diagnosis Date Noted  . 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 Smoker 11/22/2014  . Recurrent vaginitis 11/22/2014  . 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 CD (Crohn's disease) ([place]) 07/30/2014  . Body mass index (BMI) of 27.0-27.9 in adult 10/03/2013    1. Incomplete abortion    Patient currently having significant cramping and bleeding.  Ultrasound reveals nonviable intrauterine pregnancy  Plan:   Orders: No orders of the defined types were placed in this encounter.    1.  D&E  Pre-op discussions regarding Risks and Benefits of her scheduled surgery.  D&E The procedure and the risks and benefits of dilation and curettage/evacuation have been explained to the patient.  The specific risks of bleeding, infection, anesthesia, uterine perforation, and damage to bowel or bladder  have been specifically discussed.  I have answered all of her questions and I believe that she has an adequate and informed understanding of this procedure.   Finis Bud, M.D. 06/30/2017 10:32 AM

## 2017-06-30 NOTE — Op Note (Signed)
    OPERATIVE NOTE 06/30/2017 12:51 PM  PRE-OPERATIVE DIAGNOSIS:  1) MISSED AB  POST-OPERATIVE DIAGNOSIS:  2) Same  OPERATION:  D&E  SURGEON(S): Surgeon(s) and Role:    Harlin Heys, MD - Primary   ANESTHESIA: General  ESTIMATED BLOOD LOSS: 26m  OPERATIVE FINDINGS: Intra-uterine contents  SPECIMEN:  ID Type Source Tests Collected by Time Destination  1 : Products of conception [facility] Conception [facility] EHarlin Heys MD [id number]    COMPLICATIONS: None  DRAINS: Foley to gravity  DISPOSITION: Stable to recovery room  DESCRIPTION OF PROCEDURE:      The patient was prepped and draped in the dorsal lithotomy position and placed under general anesthesia. Her cervix was grasped with a Jacob's tenaculum. Respecting the position and curvature of her cervix, it was dilated to accommodate a number 10 suction curette. The suction curette was placed within the endometrial cavity and a pressure greater than 65 mmHg was allowed to build. A systematic curettage was performed in all quadrants until no additional tissue was noted. The uterus became firm and globular. Pitocin was run in the IV. The tenaculum was removed from the cervix and hemostasis was noted. The weighted speculum was removed and the patient went to recovery room in stable condition.  EHarlin Heys MD [address]SCaldwellBSanta [id number]  In 1 week    DFinis Bud M.D. 06/30/2017 12:51 PM

## 2017-06-30 NOTE — Transfer of Care (Signed)
Immediate Anesthesia Transfer of Care Note  Patient: Kathleen Owen  Procedure(s) Performed: DILATATION AND EVACUATION (N/A Uterus)  Patient Location: PACU  Anesthesia Type:General  Level of Consciousness: awake, alert  and oriented  Airway & Oxygen Therapy: Patient connected to face mask oxygen  Post-op Assessment: Post -op Vital signs reviewed and stable  Post vital signs: stable  Last Vitals:  Vitals Value Taken Time  BP 119/67 06/30/2017 12:45 PM  Temp 36.7 C 06/30/2017 12:45 PM  Pulse 127 06/30/2017 12:46 PM  Resp 21 06/30/2017 12:46 PM  SpO2 96 % 06/30/2017 12:46 PM  Vitals shown include unvalidated device data.  Last Pain:  Vitals:   06/30/17 1128  TempSrc: Temporal  PainSc: 8          Complications: No apparent anesthesia complications

## 2017-07-01 ENCOUNTER — Encounter: Payer: Self-pay | Admitting: Obstetrics and Gynecology

## 2017-07-01 LAB — SURGICAL PATHOLOGY

## 2017-07-06 NOTE — Anesthesia Postprocedure Evaluation (Signed)
Anesthesia Post Note  Patient: Kathleen Owen  Procedure(s) Performed: DILATATION AND EVACUATION (N/A Uterus)  Patient location during evaluation: PACU Anesthesia Type: General Level of consciousness: awake and alert Pain management: pain level controlled Vital Signs Assessment: post-procedure vital signs reviewed and stable Respiratory status: spontaneous breathing, nonlabored ventilation and respiratory function stable Cardiovascular status: blood pressure returned to baseline and stable Postop Assessment: no apparent nausea or vomiting Anesthetic complications: no     Last Vitals:  Vitals:   06/30/17 1402 06/30/17 1423  BP: 102/63 106/68  Pulse: 83 86  Resp: 18 18  Temp: 36.6 C 36.5 C  SpO2: 100% 100%    Last Pain:  Vitals:   06/30/17 1423  TempSrc: Temporal  PainSc: 4                  Ryenne Lynam Harvie Heck

## 2017-07-07 ENCOUNTER — Encounter: Payer: Self-pay | Admitting: Obstetrics and Gynecology

## 2017-07-07 ENCOUNTER — Ambulatory Visit (INDEPENDENT_AMBULATORY_CARE_PROVIDER_SITE_OTHER): Payer: Medicaid Other | Admitting: Obstetrics and Gynecology

## 2017-07-07 VITALS — BP 104/70 | HR 86 | Ht 63.0 in | Wt 160.2 lb

## 2017-07-07 DIAGNOSIS — F3289 Other specified depressive episodes: Secondary | ICD-10-CM

## 2017-07-07 DIAGNOSIS — N96 Recurrent pregnancy loss: Secondary | ICD-10-CM

## 2017-07-07 MED ORDER — SERTRALINE HCL 50 MG PO TABS: 50.0000 mg | ORAL_TABLET | Freq: Every day | ORAL | 1 refills | Status: DC

## 2017-07-07 NOTE — Progress Notes (Signed)
HPI:      [name] is a 27 y.o. G10P00100 who LMP was No LMP recorded.  Subjective:   She presents today with complaint of significant mood changes which have become somewhat disabling.  She is very sad about her most recent pregnancy loss and the fact that she has had many losses in a row.  She has become frustrated by this problem.  She is requesting medication to " help her get through this" She is having very little vaginal bleeding at this time. She is not attempting pregnancy.    Hx: The following portions of the patient's history were reviewed and updated as appropriate:             She  has a past medical history of Airway hyperreactivity (07/30/2014), Bacterial vaginosis (August 2016), CD (Crohn's disease) [facility]) (07/30/2014), Crohn's disease [facility]) (Jan 2016), and Miscarriage (07/18/2014). She does not have any pertinent problems on file. She  has a past surgical history that includes Tonsillectomy; Dilation and evacuation (N/A, 12/02/2016); Dilation and curettage of uterus; and Dilation and evacuation (N/A, 06/30/2017). Her family history includes Heart failure in her maternal grandmother and maternal uncle; Hypertension in her paternal grandmother; Kidney disease in her maternal uncle; Thyroid disease in her paternal aunt. She  reports that she has been smoking cigarettes.  She has been smoking about 0.50 packs per day. She has never used smokeless tobacco. She reports that she drinks alcohol. She reports that she does not use drugs. She currently has no medications in their medication list. She is allergic to aspirin; ibuprofen; and amoxicillin.       Review of Systems:  Review of Systems  Constitutional: Denied constitutional symptoms, night sweats, recent illness, fatigue, fever, insomnia and weight loss.  Eyes: Denied eye symptoms, eye pain, photophobia, vision change and visual disturbance.  Ears/Nose/Throat/Neck: Denied ear, nose, throat or neck symptoms, hearing loss,  nasal discharge, sinus congestion and sore throat.  Cardiovascular: Denied cardiovascular symptoms, arrhythmia, chest pain/pressure, edema, exercise intolerance, orthopnea and palpitations.  Respiratory: Denied pulmonary symptoms, asthma, pleuritic pain, productive sputum, cough, dyspnea and wheezing.  Gastrointestinal: Denied, gastro-esophageal reflux, melena, nausea and vomiting.  Genitourinary: Denied genitourinary symptoms including symptomatic vaginal discharge, pelvic relaxation issues, and urinary complaints.  Musculoskeletal: Denied musculoskeletal symptoms, stiffness, swelling, muscle weakness and myalgia.  Dermatologic: Denied dermatology symptoms, rash and scar.  Neurologic: Denied neurology symptoms, dizziness, headache, neck pain and syncope.  Psychiatric: Denied psychiatric symptoms, anxiety and depression.  Endocrine: Denied endocrine symptoms including hot flashes and night sweats.   Meds:   No current outpatient medications on file prior to visit.   No current facility-administered medications on file prior to visit.     Objective:     Vitals:   07/07/17 1512  BP: 104/70  Pulse: 86                Assessment:    G10P00100 Patient Active Problem List   Diagnosis Date Noted  . 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 Smoker 11/22/2014  . Recurrent vaginitis 11/22/2014  . 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 CD (Crohn's disease) ([place]) 07/30/2014  . Body mass index (BMI) of 27.0-27.9 in adult 10/03/2013     1. Other depression   2. Multiple pregnancy loss, not currently pregnant     Patient having some significant depression following pregnancy losses.   Plan:  1.  Zoloft for depression -patient to contact us immediately if she becomes worse  2.  Refer to [facility]I for multiple pregnancy losses.   Orders No orders of the defined types were placed in this encounter.   No orders of the  defined types were placed in this encounter.     F/U  No follow-ups on file. I spent 33 minutes involved in the care of this patient of which greater than 50% was spent discussing her multiple miscarriages, the possible causes and continued work-up, suggested referral to [facility], depression and how it is affecting her life, medication for depression, worsening depression and what to do.  Finis Bud, M.D. 07/07/2017 3:36 PM

## 2017-07-23 ENCOUNTER — Inpatient Hospital Stay
Admission: EM | Admit: 2017-07-23 | Discharge: 2017-07-26 | DRG: 372 | Disposition: A | Discharge status: Home / Self Care | Payer: Self-pay | Attending: Internal Medicine | Admitting: Internal Medicine

## 2017-07-23 ENCOUNTER — Encounter: Payer: Self-pay | Admitting: Emergency Medicine

## 2017-07-23 ENCOUNTER — Emergency Department: Payer: Self-pay

## 2017-07-23 ENCOUNTER — Other Ambulatory Visit: Payer: Self-pay

## 2017-07-23 DIAGNOSIS — Z79899 Other long term (current) drug therapy: Secondary | ICD-10-CM

## 2017-07-23 DIAGNOSIS — R1084 Generalized abdominal pain: Secondary | ICD-10-CM

## 2017-07-23 DIAGNOSIS — K5090 Crohn's disease, unspecified, without complications: Secondary | ICD-10-CM | POA: Diagnosis present

## 2017-07-23 DIAGNOSIS — Z880 Allergy status to penicillin: Secondary | ICD-10-CM

## 2017-07-23 DIAGNOSIS — D259 Leiomyoma of uterus, unspecified: Secondary | ICD-10-CM | POA: Diagnosis present

## 2017-07-23 DIAGNOSIS — R197 Diarrhea, unspecified: Secondary | ICD-10-CM

## 2017-07-23 DIAGNOSIS — E876 Hypokalemia: Secondary | ICD-10-CM | POA: Diagnosis present

## 2017-07-23 DIAGNOSIS — F17210 Nicotine dependence, cigarettes, uncomplicated: Secondary | ICD-10-CM | POA: Diagnosis present

## 2017-07-23 DIAGNOSIS — Z886 Allergy status to analgesic agent status: Secondary | ICD-10-CM

## 2017-07-23 DIAGNOSIS — R112 Nausea with vomiting, unspecified: Secondary | ICD-10-CM

## 2017-07-23 DIAGNOSIS — A0472 Enterocolitis due to Clostridium difficile, not specified as recurrent: Principal | ICD-10-CM | POA: Diagnosis present

## 2017-07-23 HISTORY — DX: Depression, unspecified: F32.A

## 2017-07-23 HISTORY — DX: Major depressive disorder, single episode, unspecified: F32.9

## 2017-07-23 LAB — CBC WITH DIFFERENTIAL/PLATELET
BASOS ABS: 0.1 10*3/uL (ref 0–0.1)
Basophils Relative: 1 %
EOS ABS: 0.4 10*3/uL (ref 0–0.7)
EOS PCT: 2 %
HCT: 42.3 % (ref 35.0–47.0)
Hemoglobin: 14.5 g/dL (ref 12.0–16.0)
LYMPHS PCT: 17 %
Lymphs Abs: 4.3 10*3/uL — ABNORMAL HIGH (ref 1.0–3.6)
MCH: 30.6 pg (ref 26.0–34.0)
MCHC: 34.2 g/dL (ref 32.0–36.0)
MCV: 89.5 fL (ref 80.0–100.0)
Monocytes Absolute: 1.5 10*3/uL — ABNORMAL HIGH (ref 0.2–0.9)
Monocytes Relative: 6 %
Neutro Abs: 18.4 10*3/uL — ABNORMAL HIGH (ref 1.4–6.5)
Neutrophils Relative %: 74 %
PLATELETS: 264 10*3/uL (ref 150–440)
RBC: 4.73 MIL/uL (ref 3.80–5.20)
RDW: 13.8 % (ref 11.5–14.5)
WBC: 24.8 10*3/uL — AB (ref 3.6–11.0)

## 2017-07-23 LAB — GASTROINTESTINAL PANEL BY PCR, STOOL (REPLACES STOOL CULTURE)
ADENOVIRUS F40/41: NOT DETECTED
ASTROVIRUS: NOT DETECTED
CAMPYLOBACTER SPECIES: NOT DETECTED
CYCLOSPORA CAYETANENSIS: NOT DETECTED
Cryptosporidium: NOT DETECTED
ENTEROPATHOGENIC E COLI (EPEC): NOT DETECTED
ENTEROTOXIGENIC E COLI (ETEC): NOT DETECTED
Entamoeba histolytica: NOT DETECTED
Enteroaggregative E coli (EAEC): NOT DETECTED
Giardia lamblia: NOT DETECTED
NOROVIRUS GI/GII: NOT DETECTED
PLESIMONAS SHIGELLOIDES: NOT DETECTED
Rotavirus A: NOT DETECTED
Salmonella species: NOT DETECTED
Sapovirus (I, II, IV, and V): NOT DETECTED
Shiga like toxin producing E coli (STEC): NOT DETECTED
Shigella/Enteroinvasive E coli (EIEC): NOT DETECTED
VIBRIO SPECIES: NOT DETECTED
Vibrio cholerae: NOT DETECTED
Yersinia enterocolitica: NOT DETECTED

## 2017-07-23 LAB — URINALYSIS, ROUTINE W REFLEX MICROSCOPIC
BILIRUBIN URINE: NEGATIVE
Glucose, UA: NEGATIVE mg/dL
HGB URINE DIPSTICK: NEGATIVE
KETONES UR: NEGATIVE mg/dL
Leukocytes, UA: NEGATIVE
Nitrite: NEGATIVE
PROTEIN: NEGATIVE mg/dL
Specific Gravity, Urine: 1.004 — ABNORMAL LOW (ref 1.005–1.030)
pH: 6 (ref 5.0–8.0)

## 2017-07-23 LAB — COMPREHENSIVE METABOLIC PANEL
ALT: 14 U/L (ref 14–54)
AST: 24 U/L (ref 15–41)
Albumin: 4.1 g/dL (ref 3.5–5.0)
Alkaline Phosphatase: 52 U/L (ref 38–126)
Anion gap: 9 (ref 5–15)
BUN: 9 mg/dL (ref 6–20)
CHLORIDE: 104 mmol/L (ref 101–111)
CO2: 24 mmol/L (ref 22–32)
Calcium: 9.2 mg/dL (ref 8.9–10.3)
Creatinine, Ser: 0.63 mg/dL (ref 0.44–1.00)
Glucose, Bld: 105 mg/dL — ABNORMAL HIGH (ref 65–99)
POTASSIUM: 3.1 mmol/L — AB (ref 3.5–5.1)
Sodium: 137 mmol/L (ref 135–145)
Total Bilirubin: 0.4 mg/dL (ref 0.3–1.2)
Total Protein: 7.5 g/dL (ref 6.5–8.1)

## 2017-07-23 LAB — MAGNESIUM: MAGNESIUM: 1.8 mg/dL (ref 1.7–2.4)

## 2017-07-23 LAB — C DIFFICILE QUICK SCREEN W PCR REFLEX
C DIFFICLE (CDIFF) ANTIGEN: POSITIVE — AB
C Diff toxin: NEGATIVE

## 2017-07-23 LAB — LACTIC ACID, PLASMA
LACTIC ACID, VENOUS: 2 mmol/L — AB (ref 0.5–1.9)
LACTIC ACID, VENOUS: 1.9 mmol/L (ref 0.5–1.9)

## 2017-07-23 LAB — HCG, QUANTITATIVE, PREGNANCY: HCG, BETA CHAIN, QUANT, S: 5 m[IU]/mL — AB (ref <5)

## 2017-07-23 LAB — CLOSTRIDIUM DIFFICILE BY PCR, REFLEXED: Toxigenic C. Difficile by PCR: POSITIVE — AB

## 2017-07-23 LAB — LIPASE, BLOOD: LIPASE: 30 U/L (ref 11–51)

## 2017-07-23 IMAGING — CT CT ABD-PELV W/ CM
2 of 4 series · 16 of 46 positions shown, 18 images · IV contrast (APPLIED)
Comparison: [DATE]

CLINICAL DATA: Diarrhea, nausea, vomiting, abdominal pain

EXAM:
CT ABDOMEN AND PELVIS WITH CONTRAST
TECHNIQUE: Multidetector CT imaging of the abdomen and pelvis was performed
using the standard protocol following bolus administration of
intravenous contrast.
CONTRAST:  100mL [JU] IOPAMIDOL ([JU]) INJECTION 61%

[Series 2: routine abd/pel with · axial · 0.63mm/px · z∈[-1191,-731]mm · 13 of 102 slices shown, 15 images]
[im 5/102  soft-tissue]
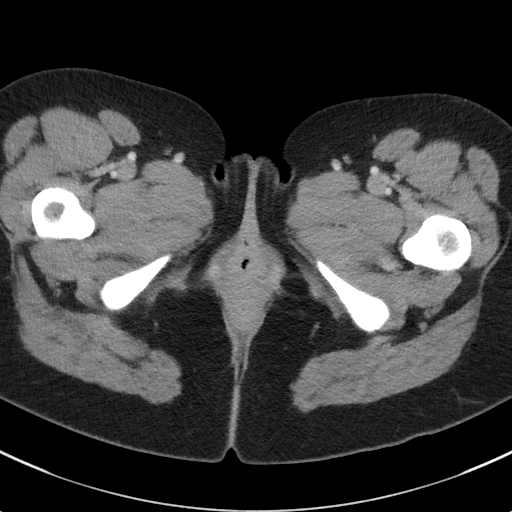
[im 5/102  bone]
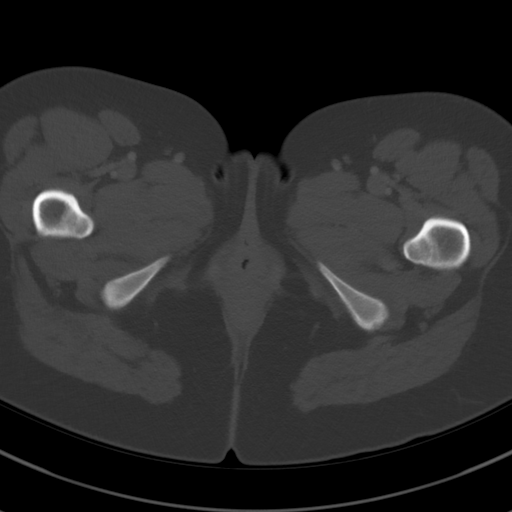
[im 13/102  soft-tissue]
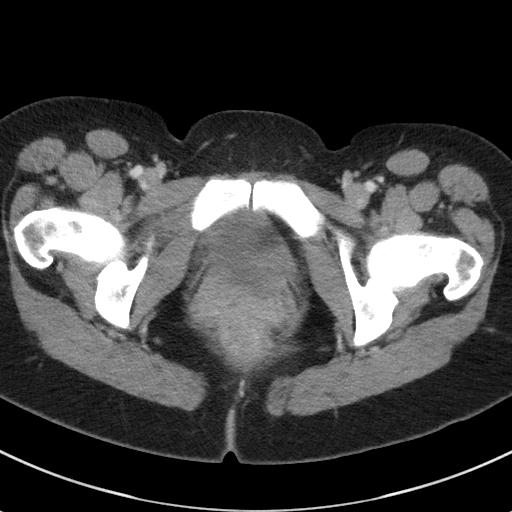
[im 22/102  soft-tissue]
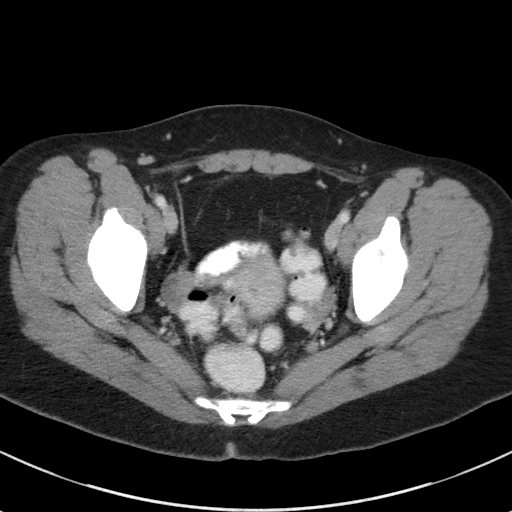
[im 30/102  soft-tissue]
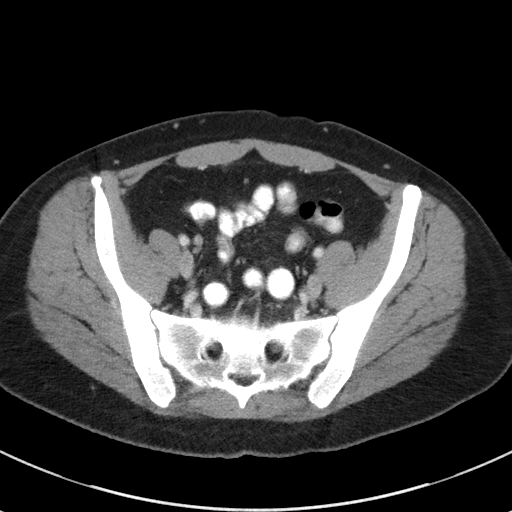
[im 34/102  soft-tissue]
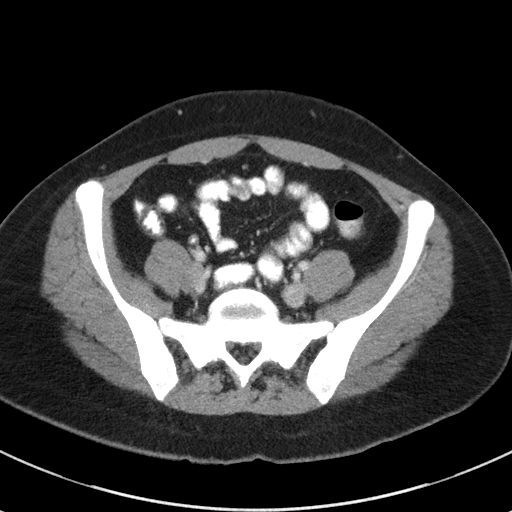
[im 43/102  soft-tissue]
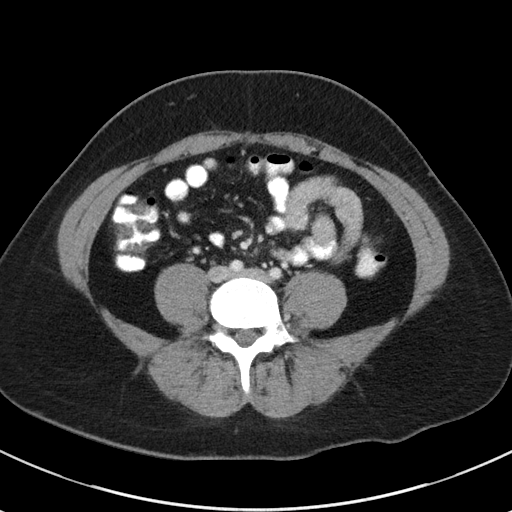
[im 51/102  soft-tissue]
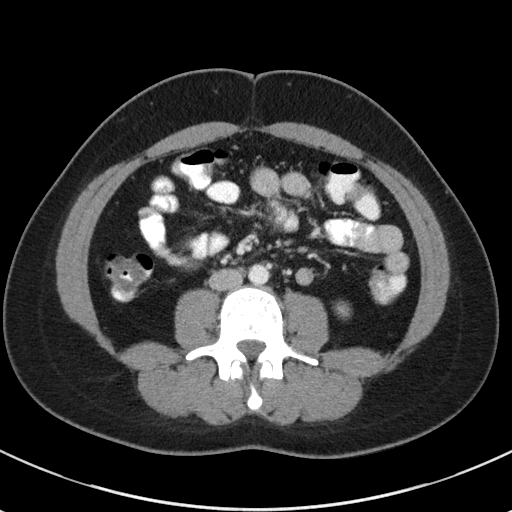
[im 59/102  soft-tissue]
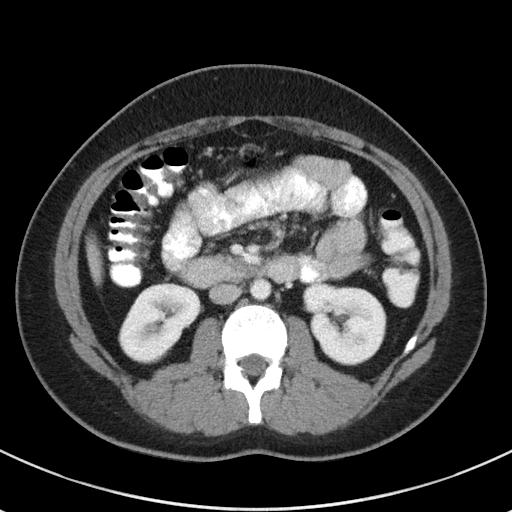
[im 68/102  soft-tissue]
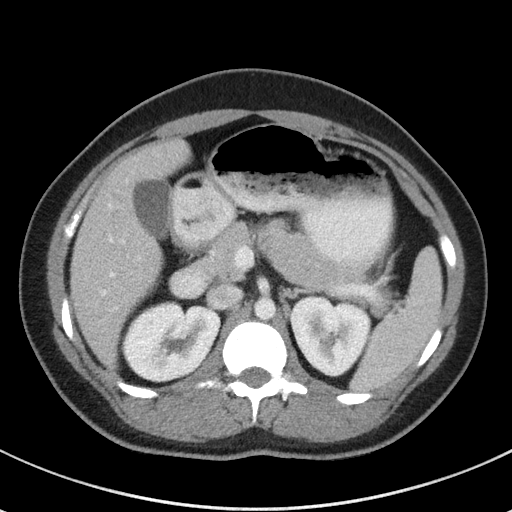
[im 68/102  bone]
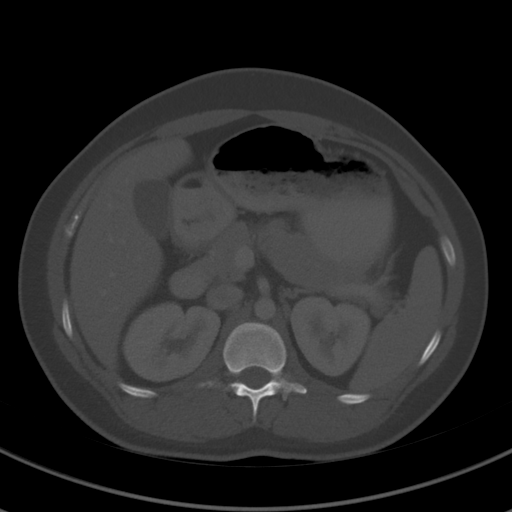
[im 72/102  soft-tissue]
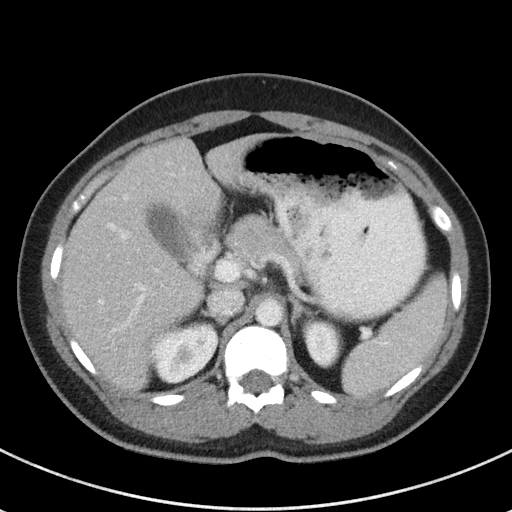
[im 80/102  soft-tissue]
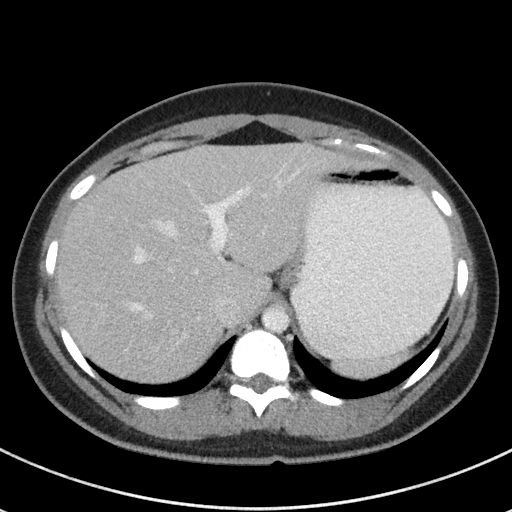
[im 89/102  soft-tissue]
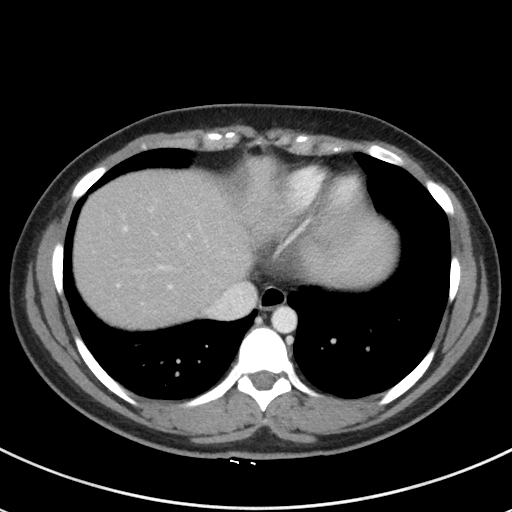
[im 97/102  soft-tissue]
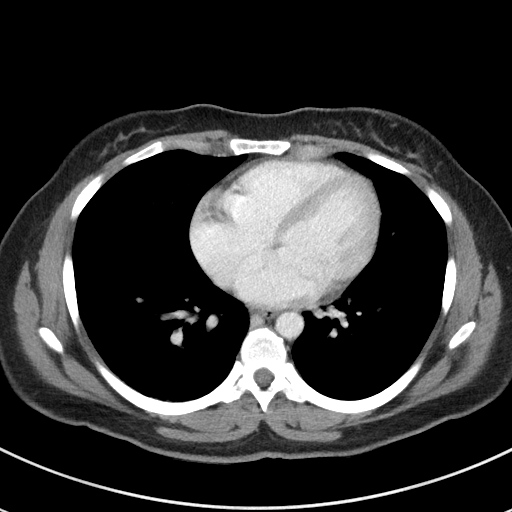

[Series 5: coronal st · coronal · 0.68mm/px · 3 of 85 slices shown]
[im 29/85  soft-tissue]
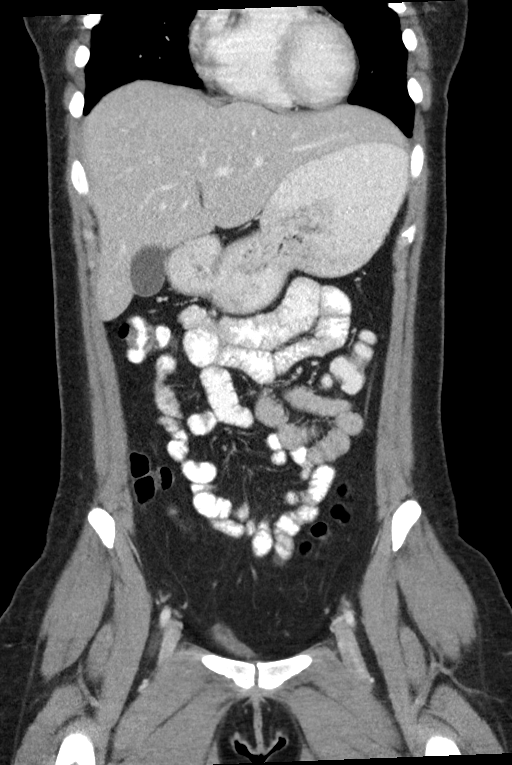
[im 38/85  soft-tissue]
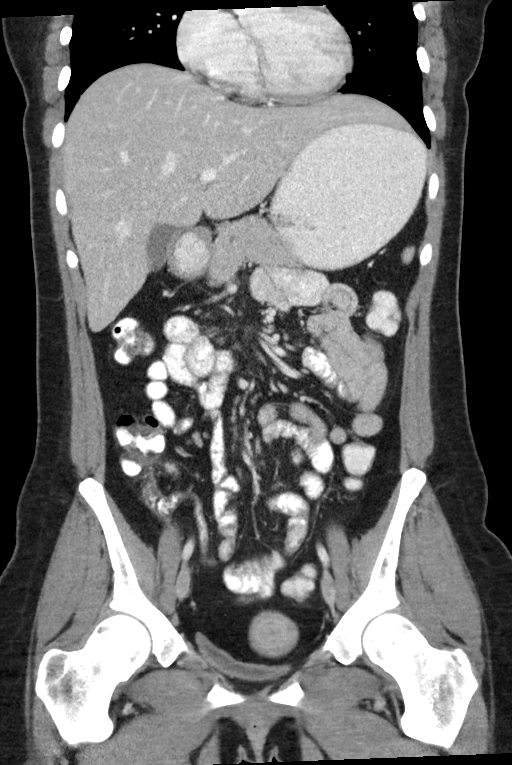
[im 47/85  soft-tissue]
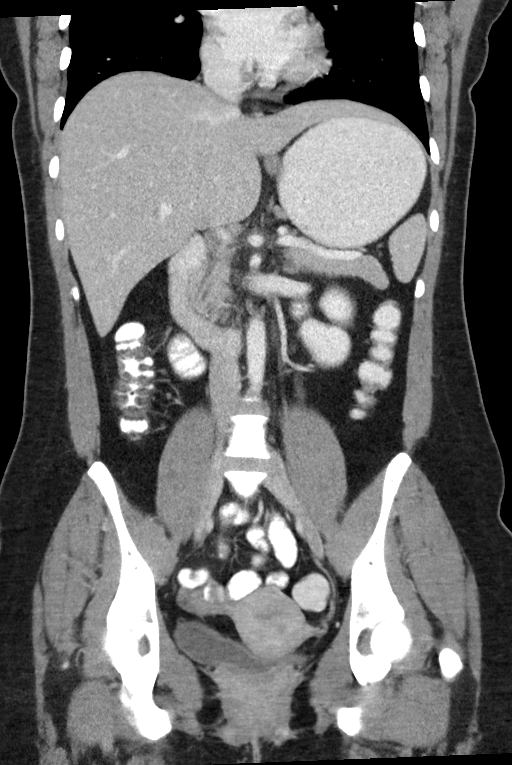

[16 of 46 positions shown; findings below may reference images not displayed]

FINDINGS: Lower chest: Lung bases are clear. No effusions. Heart is normal
size.

Hepatobiliary: No focal hepatic abnormality. Gallbladder
unremarkable.

Pancreas: No focal abnormality or ductal dilatation.

Spleen: No focal abnormality.  Normal size.

Adrenals/Urinary Tract: No adrenal abnormality. No focal renal
abnormality. No stones or hydronephrosis. Urinary bladder is
unremarkable.

Stomach/Bowel: Normal appendix. Stomach, large and small bowel
grossly unremarkable.

Vascular/Lymphatic: No evidence of aneurysm or adenopathy.

Reproductive: Probable anterior fundal fibroid measuring 4.5 cm. No
adnexal mass.

Other: No free fluid or free air.

Musculoskeletal: No acute bony abnormality.
IMPRESSION: No acute findings in the abdomen or pelvis.

Uterine fibroid.

## 2017-07-23 MED ORDER — POTASSIUM CHLORIDE 20 MEQ PO PACK: 40.0000 meq | PACK | Freq: Once | ORAL | Status: DC

## 2017-07-23 MED ORDER — KETOROLAC TROMETHAMINE 30 MG/ML IJ SOLN
30.0000 mg | Freq: Four times a day (QID) | INTRAMUSCULAR | Status: DC | PRN
  Administered 2017-07-23 – 2017-07-26 (×7): 30 mg via INTRAVENOUS
  Filled 2017-07-23 (×7): qty 1

## 2017-07-23 MED ORDER — ONDANSETRON HCL 4 MG/2ML IJ SOLN
4.0000 mg | INTRAMUSCULAR | Status: AC
  Administered 2017-07-23: 4 mg via INTRAVENOUS

## 2017-07-23 MED ORDER — POTASSIUM CHLORIDE 20 MEQ PO PACK
40.0000 meq | PACK | Freq: Once | ORAL | Status: AC
  Administered 2017-07-23: 40 meq via ORAL
  Filled 2017-07-23: qty 2

## 2017-07-23 MED ORDER — MAGNESIUM SULFATE IN D5W 1-5 GM/100ML-% IV SOLN
1.0000 g | Freq: Once | INTRAVENOUS | Status: AC
  Administered 2017-07-23: 1 g via INTRAVENOUS
  Filled 2017-07-23 (×2): qty 100

## 2017-07-23 MED ORDER — POTASSIUM CHLORIDE CRYS ER 20 MEQ PO TBCR
40.0000 meq | EXTENDED_RELEASE_TABLET | Freq: Once | ORAL | Status: AC
  Administered 2017-07-23: 40 meq via ORAL
  Filled 2017-07-23: qty 2

## 2017-07-23 MED ORDER — ONDANSETRON HCL 4 MG PO TABS
4.0000 mg | ORAL_TABLET | Freq: Four times a day (QID) | ORAL | Status: DC | PRN
  Administered 2017-07-25 – 2017-07-26 (×2): 4 mg via ORAL
  Filled 2017-07-23 (×2): qty 1

## 2017-07-23 MED ORDER — SODIUM CHLORIDE 0.9 % IV SOLN
INTRAVENOUS | Status: DC
  Administered 2017-07-23 – 2017-07-25 (×5): via INTRAVENOUS

## 2017-07-23 MED ORDER — ACETAMINOPHEN 650 MG RE SUPP: 650.0000 mg | Freq: Four times a day (QID) | RECTAL | Status: DC | PRN

## 2017-07-23 MED ORDER — HYDROMORPHONE HCL 1 MG/ML IJ SOLN
INTRAMUSCULAR | Status: AC
  Administered 2017-07-23: 1 mg via INTRAVENOUS
  Filled 2017-07-23: qty 1

## 2017-07-23 MED ORDER — ONDANSETRON HCL 4 MG/2ML IJ SOLN
INTRAMUSCULAR | Status: AC
  Administered 2017-07-23: 4 mg via INTRAVENOUS
  Filled 2017-07-23: qty 2

## 2017-07-23 MED ORDER — SODIUM CHLORIDE 0.9 % IV BOLUS
1000.0000 mL | Freq: Once | INTRAVENOUS | Status: AC
  Administered 2017-07-23: 1000 mL via INTRAVENOUS

## 2017-07-23 MED ORDER — HYDROMORPHONE HCL 1 MG/ML IJ SOLN
1.0000 mg | Freq: Once | INTRAMUSCULAR | Status: AC
  Administered 2017-07-23: 1 mg via INTRAVENOUS

## 2017-07-23 MED ORDER — VANCOMYCIN 50 MG/ML ORAL SOLUTION
125.0000 mg | Freq: Four times a day (QID) | ORAL | Status: DC
  Administered 2017-07-23 – 2017-07-26 (×13): 125 mg via ORAL
  Filled 2017-07-23 (×19): qty 2.5

## 2017-07-23 MED ORDER — ONDANSETRON HCL 4 MG/2ML IJ SOLN
4.0000 mg | Freq: Four times a day (QID) | INTRAMUSCULAR | Status: DC | PRN
  Administered 2017-07-24 (×2): 4 mg via INTRAVENOUS
  Filled 2017-07-23 (×2): qty 2

## 2017-07-23 MED ORDER — MORPHINE SULFATE (PF) 4 MG/ML IV SOLN
4.0000 mg | Freq: Once | INTRAVENOUS | Status: AC
  Administered 2017-07-23: 4 mg via INTRAVENOUS
  Filled 2017-07-23: qty 1

## 2017-07-23 MED ORDER — ACETAMINOPHEN 325 MG PO TABS: 650.0000 mg | ORAL_TABLET | Freq: Four times a day (QID) | ORAL | Status: DC | PRN

## 2017-07-23 MED ORDER — ENOXAPARIN SODIUM 40 MG/0.4ML ~~LOC~~ SOLN
40.0000 mg | SUBCUTANEOUS | Status: DC
  Administered 2017-07-23 – 2017-07-25 (×3): 40 mg via SUBCUTANEOUS
  Filled 2017-07-23 (×3): qty 0.4

## 2017-07-23 MED ORDER — HYDROMORPHONE HCL 1 MG/ML PO LIQD: 1.0000 mg | Freq: Once | ORAL | Status: DC

## 2017-07-23 MED ORDER — ONDANSETRON HCL 4 MG/2ML IJ SOLN
4.0000 mg | INTRAMUSCULAR | Status: AC
  Administered 2017-07-23: 4 mg via INTRAVENOUS
  Filled 2017-07-23: qty 2

## 2017-07-23 MED ORDER — SERTRALINE HCL 50 MG PO TABS: 50.0000 mg | ORAL_TABLET | Freq: Every day | ORAL | Status: DC

## 2017-07-23 MED ORDER — HYDROCODONE-ACETAMINOPHEN 5-325 MG PO TABS
1.0000 | ORAL_TABLET | ORAL | Status: DC | PRN
  Administered 2017-07-23: 1 via ORAL
  Administered 2017-07-23 – 2017-07-26 (×10): 2 via ORAL
  Filled 2017-07-23 (×8): qty 2
  Filled 2017-07-23: qty 1
  Filled 2017-07-23 (×2): qty 2

## 2017-07-23 MED ORDER — SERTRALINE HCL 50 MG PO TABS
50.0000 mg | ORAL_TABLET | Freq: Every day | ORAL | Status: DC
  Administered 2017-07-23 – 2017-07-25 (×3): 50 mg via ORAL
  Filled 2017-07-23 (×3): qty 1

## 2017-07-23 MED ORDER — IOPAMIDOL (ISOVUE-300) INJECTION 61%
100.0000 mL | Freq: Once | INTRAVENOUS | Status: AC | PRN
  Administered 2017-07-23: 100 mL via INTRAVENOUS

## 2017-07-23 MED ORDER — IOPAMIDOL (ISOVUE-300) INJECTION 61%
15.0000 mL | INTRAVENOUS | Status: AC
  Administered 2017-07-23: 15 mL via ORAL

## 2017-07-23 MED ORDER — NICOTINE 14 MG/24HR TD PT24
14.0000 mg | MEDICATED_PATCH | Freq: Every day | TRANSDERMAL | Status: DC
  Administered 2017-07-23 – 2017-07-26 (×4): 14 mg via TRANSDERMAL
  Filled 2017-07-23 (×4): qty 1

## 2017-07-23 NOTE — ED Notes (Signed)
Pt continues to have diarrhea.

## 2017-07-23 NOTE — Progress Notes (Addendum)
Pharmacy Electrolyte Monitoring Consult:  Pharmacy consulted to assist in monitoring and replacing electrolytes in this 27 y.o. female admitted on 07/23/2017 with clostridium difficile colitis.   Patient is currently ordered NS @ 153m/hr.   Labs:  Sodium (mmol/L)  Date Value  07/23/2017 137  03/27/2014 141   Potassium (mmol/L)  Date Value  07/23/2017 3.1 (L)  03/27/2014 3.5   Magnesium (mg/dL)  Date Value  07/23/2017 1.8   Calcium (mg/dL)  Date Value  07/23/2017 9.2   Calcium, Total (mg/dL)  Date Value  03/27/2014 9.0   Albumin (g/dL)  Date Value  07/23/2017 4.1  03/23/2014 3.6    Assessment/Plan: Patient initiated on magnesium 1g IV x 1 and potassium 44m PO x 2 doses. Will recheck electrolytes with am labs.   Pharmacy will continue to monitor and adjust per consult.    Simpson,Michael L 07/23/2017 9:11 PM

## 2017-07-23 NOTE — ED Notes (Signed)
MD made aware of pt's unchanged pain level. See mar for administered medications

## 2017-07-23 NOTE — Progress Notes (Signed)
Pharmacy Antibiotic Note  Kathleen Owen is a 27 y.o. female admitted on 07/23/2017 with clostridium difficile colitis and past medical history significant for Crohn's disease.  Pharmacy has been consulted for vancomycin oral dosing. Patient is no noted history of clostridium difficile and is not any other antibiotics.   Plan: Vancomycin 174m PO QID x 4 doses.   Height: 5' 3"  (160 cm) Weight: 160 lb (72.6 kg) IBW/kg (Calculated) : 52.4  Temp (24hrs), Avg:98 F (36.7 C), Min:97.6 F (36.4 C), Max:98.5 F (36.9 C)  Recent Labs  Lab 07/23/17 0555 07/23/17 0840  WBC 24.8*  --   CREATININE 0.63  --   LATICACIDVEN 2.0* 1.9    Estimated Creatinine Clearance: 100.9 mL/min (by C-G formula based on SCr of 0.63 mg/dL).    Allergies  Allergen Reactions  . Aspirin     Chron's Disease  . Ibuprofen     Chron's Disease  . Amoxicillin Rash    Antimicrobials this admission: Vancomycin 5/31 >>   Dose adjustments this admission: N/A  Microbiology results: 5/31 GI Panel: negative 5/31 CDiff PCR: positive 5/31 CDiff QuickScan: antigen positive/toxin negative  Thank you for allowing pharmacy to be a part of this patient's care.  Tashia Leiterman L 07/23/2017 9:13 PM

## 2017-07-23 NOTE — ED Provider Notes (Signed)
[facility] Emergency Department Provider Note  ____________________________________________   First MD Initiated Contact with Patient 07/23/17 [phone]     (approximate)  I have reviewed the triage vital signs and the nursing notes.   HISTORY  Chief Complaint Abdominal Pain    HPI Kathleen Owen is a 27 y.o. female with medical history as listed below who presents for evaluation of severe generalized abdominal pain associated with minimal nausea vomiting but copious diarrhea.  She reports that she has been having watery diarrhea "constantly" for about 3 days.  Any amount of oral intake makes it worse and nothing makes it better.  She also reports a generalized sharp stabbing and burning pain all throughout her abdomen which is worse both at the bottom but also present at the top.  She denies fever/chills, chest pain, shortness of breath, and dysuria.  Her recent medical history is notable for a D&C by Dr. Amalia Hailey about 3 to 4 weeks ago.  She has recovered from that procedure well and was not having any symptoms until 3 days ago as described above.  She reports that she was diagnosed with Crohn's disease after Dr. Allen Norris performed a colonoscopy 2 or 3 years ago.  She reports that he said there was nothing unusual on the scope but he diagnosed her "based on my white blood cells".  She was on prednisone for an extended period of time but reports that it has been at least a year since she has taken prednisone.  Past Medical History:  Diagnosis Date  . 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 CD (Crohn's disease) ([place]) 07/30/2014  . Crohn's disease [facility]) Jan 2016  . Miscarriage 07/18/2014    Patient Active Problem List   Diagnosis Date Noted  . 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 Smoker 11/22/2014  . Recurrent vaginitis 11/22/2014  . Airway hyperreactivity  07/30/2014  . CD (Crohn's disease) ([place]) 07/30/2014  . Body mass index (BMI) of 27.0-27.9 in adult 10/03/2013    Past Surgical History:  Procedure Laterality Date  . DILATION AND CURETTAGE OF UTERUS    . DILATION AND EVACUATION N/A 12/02/2016   Procedure: DILATATION AND EVACUATION;  Surgeon: Harlin Heys, MD;  Location: [facility] ORS;  Service: Gynecology;  Laterality: N/A;  . DILATION AND EVACUATION N/A 06/30/2017   Procedure: DILATATION AND EVACUATION;  Surgeon: Harlin Heys, MD;  Location: [facility] ORS;  Service: Gynecology;  Laterality: N/A;  . TONSILLECTOMY      Prior to Admission medications   Medication Sig Start Date End Date Taking? Authorizing Provider  sertraline (ZOLOFT) 50 MG tablet Take 1 tablet (50 mg total) by mouth daily. 07/07/17 08/06/17  Harlin Heys, MD    Allergies Aspirin; Ibuprofen; and Amoxicillin  Family History  Problem Relation Age of Onset  . Kidney disease Maternal Uncle   . Heart failure Maternal Uncle   . Heart failure Maternal Grandmother   . Hypertension Paternal Grandmother   . Thyroid disease Paternal Aunt     Social History Social History   Tobacco Use  . Smoking status: Current Every Day Smoker    Packs/day: 0.50    Types: Cigarettes  . Smokeless tobacco: Never Used  Substance Use Topics  . Alcohol use: Yes    Comment: occasionally  . Drug use: No    Review of Systems Constitutional: No fever/chills Eyes: No visual changes.  ENT: No sore throat. Cardiovascular: Denies chest pain. Respiratory: Denies shortness of breath. Gastrointestinal: Abdominal pain and diarrhea with minimal nausea and vomiting for 3 days as described above Genitourinary: Negative for dysuria. Musculoskeletal: Negative for neck pain.  Negative for back pain. Integumentary: Negative for rash. Neurological: Negative for headaches, focal weakness or numbness.   ____________________________________________   PHYSICAL EXAM:  VITAL SIGNS: ED  Triage Vitals  Enc Vitals Group     BP 07/23/17 0528 107/61     Pulse Rate 07/23/17 0528 72     Resp 07/23/17 0528 18     Temp --      Temp Source 07/23/17 0528 Oral     SpO2 07/23/17 0528 100 %     Weight 07/23/17 0511 72.6 kg (160 lb)     Height 07/23/17 0511 1.6 m (5\' 3" )     Head Circumference --      Peak Flow --      Pain Score 07/23/17 0511 9     Pain Loc --      Pain Edu? --      Excl. in [place]? --     Constitutional: Alert and oriented.  Appears to be in severe distress, rocking back and forth and holding her abdomen. Eyes: Conjunctivae are normal.  Head: Atraumatic. Nose: No congestion/rhinnorhea. Mouth/Throat: Mucous membranes are moist. Neck: No stridor.  No meningeal signs.   Cardiovascular: Normal rate, regular rhythm. Good peripheral circulation. Grossly normal heart sounds. Respiratory: Normal respiratory effort.  No retractions. Lungs CTAB. Gastrointestinal: Soft, possibly mildly distended, tender to palpation throughout the abdomen, voluntarily guarding whenever I palpate. Musculoskeletal: No lower extremity tenderness nor edema. No gross deformities of extremities. Neurologic:  Normal speech and language. No gross focal neurologic deficits are appreciated.  Skin:  Skin is warm, dry and intact. No rash noted. Psychiatric: Mood and affect are normal. Speech and behavior are normal.  ____________________________________________   LABS (all labs ordered are listed, but only abnormal results are displayed)  Labs Reviewed  CBC WITH DIFFERENTIAL/PLATELET - Abnormal; Notable for the following components:      Result Value   WBC 24.8 (*)    Neutro Abs 18.4 (*)    Lymphs Abs 4.3 (*)    Monocytes Absolute 1.5 (*)    All other components within normal limits  COMPREHENSIVE METABOLIC PANEL - Abnormal; Notable for the following components:   Potassium 3.1 (*)    Glucose, Bld 105 (*)    All other components within normal limits  LACTIC ACID, PLASMA - Abnormal;  Notable for the following components:   Lactic Acid, Venous 2.0 (*)    All other components within normal limits  HCG, QUANTITATIVE, PREGNANCY - Abnormal; Notable for the following components:   hCG, Beta Chain, Quant, S 5 (*)    All other components within normal limits  GASTROINTESTINAL PANEL BY PCR, STOOL (REPLACES STOOL CULTURE)  C DIFFICILE QUICK SCREEN W PCR REFLEX  CALPROTECTIN, FECAL  LIPASE, BLOOD  MAGNESIUM  LACTIC ACID, PLASMA  URINALYSIS, ROUTINE W REFLEX MICROSCOPIC  POC URINE PREG, ED   ____________________________________________  EKG  None - EKG not ordered by ED physician ____________________________________________  RADIOLOGY   ED MD interpretation:  CT pending  Official radiology report(s): No results found.  ____________________________________________   PROCEDURES  Critical Care performed: No   Procedure(s) performed:   Procedures   ____________________________________________   INITIAL IMPRESSION / ASSESSMENT AND PLAN / ED COURSE  As part of my medical decision making, I reviewed  the following data within the [place] notes reviewed and incorporated, Labs reviewed , Old chart reviewed, Patient signed out to Dr. Corky Downs and Notes from prior ED visits    Differential diagnosis includes, but is not limited to, Crohn's flare, other nonspecific colitis or enteritis including but not limited to viral or bacterial infection, SBO/ileus, intra-abdominal abscess or infection, less likely gallbladder disease.  Given the patient's history of Crohn's for which she has not been taking any medicine for at least a year, I think that a Crohn's flare is the most likely, particularly given the diarrhea as the primary issue and she is almost begging me for something to stop the diarrhea in spite of the pain she is experiencing as well.  I will treat with morphine 4 mg IV and Zofran 4 mg IV.  I am sending a broad lab work-up including a  lactic acid and we will also check GI pathogen panel including C. difficile if she is able to provide a stool sample.  Urinalysis is also pending.  I have also ordered a CT scan of the abdomen and pelvis with oral and IV contrast.  She understands and agrees with the plan.   Clinical Course as of Jul 24 714  Fri Jul 23, 2017  0612 Patient is writhing around after the morphine and reports pain still severe.  Giving Dilaudid 1 mg IV.   [CF]  0614 WBC(!): 24.8 [CF]  0350 The patient is noted to have a lactate = 2. With the current information available to me, I don't think the patient is in septic shock or even sepsis. The lactate of 2 is related to volume depletion secondary to copious diarrhea.   Lactic Acid, Venous(!!): 2.0 [CF]  0634 I added on a fecal calprotectin level to the existing stool studies which I think may be helpful in diagnosing or confirming the presence of Crohn's flare.   [CF]  0938 Will start potassium repletion  Potassium(!): 3.1 [CF]  0648 Suspect the HCG of 5 is representative of her recent D&C, not a new pregnancy.  HCG, Beta Chain, Quant, S(!): 5 [CF]  0649 Lipase: 30 [CF]  [id number] Transferring ED care to Dr. Corky Downs to follow up stool studies and CT scan and determine appropriate disposition.  Discussion with GI may be necessary.   [CF]    Clinical Course User Index [CF] Hinda Kehr, MD    ____________________________________________  FINAL CLINICAL IMPRESSION(S) / ED DIAGNOSES  Final diagnoses:  Generalized abdominal pain  Intractable diarrhea  Non-intractable vomiting with nausea, unspecified vomiting type     MEDICATIONS GIVEN DURING THIS VISIT:  Medications  iopamidol (ISOVUE-300) 61 % injection 15 mL (15 mLs Oral Contrast Given 07/23/17 0605)  sodium chloride 0.9 % bolus 1,000 mL (1,000 mLs Intravenous New Bag/Given 07/23/17 0619)  morphine 4 MG/ML injection 4 mg (4 mg Intravenous Given 07/23/17 0547)  ondansetron (ZOFRAN) injection 4 mg (4 mg  Intravenous Given 07/23/17 0547)  HYDROmorphone (DILAUDID) injection 1 mg (1 mg Intravenous Given 07/23/17 1829)     ED Discharge Orders    None       Note:  This document was prepared using Dragon voice recognition software and may include unintentional dictation errors.    Hinda Kehr, MD 07/23/17 [phone]

## 2017-07-23 NOTE — ED Notes (Signed)
Informed Ct patient finished with oral contrast.

## 2017-07-23 NOTE — ED Notes (Signed)
Report to Jack, RN  

## 2017-07-23 NOTE — Progress Notes (Signed)
Nutrition Brief Note  Patient identified on the Malnutrition Screening Tool (MST) Report  Wt Readings from Last 15 Encounters:  07/23/17 160 lb (72.6 kg)  07/07/17 160 lb 3.2 oz (72.7 kg)  06/30/17 160 lb (72.6 kg)  06/30/17 160 lb 6.4 oz (72.8 kg)  06/23/17 159 lb 6.4 oz (72.3 kg)  05/25/17 158 lb 14.4 oz (72.1 kg)  04/23/17 160 lb 3.2 oz (72.7 kg)  03/22/17 159 lb 1.6 oz (72.2 kg)  12/01/16 162 lb (73.5 kg)  11/30/16 162 lb 6.4 oz (73.7 kg)  11/12/16 163 lb 3 oz (74 kg)  10/22/16 165 lb 3.2 oz (74.9 kg)  08/27/16 160 lb (72.6 kg)  04/07/16 160 lb (72.6 kg)  09/21/15 155 lb (108.58 kg)   27 year old female with history of tobacco dependence and Crohn's disease not on any medications who presents with diarrhea.  Pt admitted with c-diff.   Pt sleeping soundly at time of visit and did not arouse to voice. Unable to perform nutrition-focused physical exam at this time.   Reviewed wt hx; wt has been stable over the past 2 years.   Body mass index is 28.34 kg/m. Patient meets criteria for overweight based on current BMI.   Current diet order is clear liquid, patient is consuming approximately n/a% of meals at this time. Labs and medications reviewed.   No nutrition interventions warranted at this time. If nutrition issues arise, please consult RD.   Kaelen Caughlin A. Jimmye Norman, RD, LDN, CDE Pager: [phone] After hours Pager: [phone]

## 2017-07-23 NOTE — H&P (Signed)
[place] [facility] [place] NAME: Kathleen Owen    MR#:  [id number]  DATE OF BIRTH:  Oct 30, 1990  DATE OF ADMISSION:  07/23/2017  PRIMARY CARE PHYSICIAN: Pauline Good Thornell Mule, MD   REQUESTING/REFERRING PHYSICIAN: Dr. Corky Downs  CHIEF COMPLAINT:   Diarrhea HISTORY OF PRESENT ILLNESS:  Kathleen Owen  is a 27 y.o. female with a known history of Crohn's disease not on any medications followed by Dr.Wohl who presents today to the emergency room due to diarrhea.  Patient reports over the past 3 days she has had profuse diarrhea and generalized abdominal pain with nausea and vomiting.  She is unable to take in oral intake.  In the emergency room CT scan was performed which does not show acute pathology however C. Difficile testing was positive.  She denies recent antibiotics.  She denies working in a nursing home or being hospitalized recently.  PAST MEDICAL HISTORY:   Past Medical History:  Diagnosis Date  . 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 CD (Crohn's disease) ([place]) 07/30/2014  . Crohn's disease [facility]) Jan 2016  . Miscarriage 07/18/2014    PAST SURGICAL HISTORY:   Past Surgical History:  Procedure Laterality Date  . DILATION AND CURETTAGE OF UTERUS    . DILATION AND EVACUATION N/A 12/02/2016   Procedure: DILATATION AND EVACUATION;  Surgeon: Harlin Heys, MD;  Location: [facility] ORS;  Service: Gynecology;  Laterality: N/A;  . DILATION AND EVACUATION N/A 06/30/2017   Procedure: DILATATION AND EVACUATION;  Surgeon: Harlin Heys, MD;  Location: [facility] ORS;  Service: Gynecology;  Laterality: N/A;  . TONSILLECTOMY      SOCIAL HISTORY:   Social History   Tobacco Use  . Smoking status: Current Every Day Smoker    Packs/day: 0.50    Types: Cigarettes  . Smokeless tobacco: Never Used  Substance Use Topics  . Alcohol use: Yes    Comment: occasionally    FAMILY HISTORY:   Family History   Problem Relation Age of Onset  . Kidney disease Maternal Uncle   . Heart failure Maternal Uncle   . Heart failure Maternal Grandmother   . Hypertension Paternal Grandmother   . Thyroid disease Paternal Aunt     DRUG ALLERGIES:   Allergies  Allergen Reactions  . Aspirin     Chron's Disease  . Ibuprofen     Chron's Disease  . Amoxicillin Rash    REVIEW OF SYSTEMS:   Review of Systems  Constitutional: Negative.  Negative for chills, fever and malaise/fatigue.  HENT: Negative.  Negative for ear discharge, ear pain, hearing loss, nosebleeds and sore throat.   Eyes: Negative.  Negative for blurred vision and pain.  Respiratory: Negative.  Negative for cough, hemoptysis, shortness of breath and wheezing.   Cardiovascular: Negative.  Negative for chest pain, palpitations and leg swelling.  Gastrointestinal: Positive for abdominal pain, diarrhea, nausea and vomiting. Negative for blood in stool.  Genitourinary: Negative.  Negative for dysuria.  Musculoskeletal: Negative.  Negative for back pain.  Skin: Negative.   Neurological: Negative for dizziness, tremors, speech change, focal weakness, seizures and headaches.  Endo/Heme/Allergies: Negative.  Does not bruise/bleed easily.  Psychiatric/Behavioral: Negative.  Negative for depression, hallucinations and suicidal ideas.    MEDICATIONS AT HOME:   Prior to Admission medications   Medication Sig Start Date End Date Taking? Authorizing Provider  sertraline (ZOLOFT) 50 MG tablet Take 1 tablet (50 mg total) by mouth  daily. 07/07/17 08/06/17 Yes Harlin Heys, MD      VITAL SIGNS:  Blood pressure 114/79, pulse 65, temperature 97.6 F (36.4 C), temperature source Oral, resp. rate 18, height 5' 3"  (1.6 m), weight 72.6 kg (160 lb), SpO2 98 %.  PHYSICAL EXAMINATION:   Physical Exam  Constitutional: She is oriented to person, place, and time. No distress.  HENT:  Head: Normocephalic.  Eyes: No scleral icterus.  Neck: Normal  range of motion. Neck supple. No JVD present. No tracheal deviation present.  Cardiovascular: Normal rate, regular rhythm and normal heart sounds. Exam reveals no gallop and no friction rub.  No murmur heard. Pulmonary/Chest: Effort normal and breath sounds normal. No respiratory distress. She has no wheezes. She has no rales. She exhibits no tenderness.  Abdominal: Soft. Bowel sounds are normal. She exhibits no distension and no mass. There is generalized tenderness. There is no rebound and no guarding.  Musculoskeletal: Normal range of motion. She exhibits no edema.  Neurological: She is alert and oriented to person, place, and time.  Skin: Skin is warm. No rash noted. No erythema.  Psychiatric: Judgment normal.      LABORATORY PANEL:   CBC Recent Labs  Lab 07/23/17 0555  WBC 24.8*  HGB 14.5  HCT 42.3  PLT 264   ------------------------------------------------------------------------------------------------------------------  Chemistries  Recent Labs  Lab 07/23/17 0555  NA 137  K 3.1*  CL 104  CO2 24  GLUCOSE 105*  BUN 9  CREATININE 0.63  CALCIUM 9.2  MG 1.8  AST 24  ALT 14  ALKPHOS 52  BILITOT 0.4   ------------------------------------------------------------------------------------------------------------------  Cardiac Enzymes No results for input(s): TROPONINI in the last 168 hours. ------------------------------------------------------------------------------------------------------------------  RADIOLOGY:  Ct Abdomen Pelvis W Contrast  Result Date: 07/23/2017 CLINICAL DATA:  Diarrhea, nausea, vomiting, abdominal pain EXAM: CT ABDOMEN AND PELVIS WITH CONTRAST TECHNIQUE: Multidetector CT imaging of the abdomen and pelvis was performed using the standard protocol following bolus administration of intravenous contrast. CONTRAST:  160m ISOVUE-300 IOPAMIDOL (ISOVUE-300) INJECTION 61% COMPARISON:  07/26/2015 FINDINGS: Lower chest: Lung bases are clear. No  effusions. Heart is normal size. Hepatobiliary: No focal hepatic abnormality. Gallbladder unremarkable. Pancreas: No focal abnormality or ductal dilatation. Spleen: No focal abnormality.  Normal size. Adrenals/Urinary Tract: No adrenal abnormality. No focal renal abnormality. No stones or hydronephrosis. Urinary bladder is unremarkable. Stomach/Bowel: Normal appendix. Stomach, large and small bowel grossly unremarkable. Vascular/Lymphatic: No evidence of aneurysm or adenopathy. Reproductive: Probable anterior fundal fibroid measuring 4.5 cm. No adnexal mass. Other: No free fluid or free air. Musculoskeletal: No acute bony abnormality. IMPRESSION: No acute findings in the abdomen or pelvis. Uterine fibroid. Electronically Signed   By: KRolm BaptiseM.D.   On: 07/23/2017 08:47    EKG:    IMPRESSION AND PLAN:   27year old female with history of tobacco dependence and Crohn's disease not on any medications who presents with diarrhea.  1.  C. difficile colitis: Vancomycin started.  Treat for 2 weeks.  2.  Leukocytosis due to C. difficile colitis: Repeat CBC in a.m.  3.  History of Crohn's disease: Patient can follow-up with GI as an outpatient  4.Tobacco dependence: Patient is encouraged to quit smoking. Counseling was provided for 4 minutes.   5.  Hypokalemia: Replete and recheck in a.m.    All the records are reviewed and case discussed with ED provider. Management plans discussed with the patient and she is in agreement  CODE STATUS: Full  TOTAL TIME TAKING CARE OF THIS PATIENT:  39 minutes.    Aydyn Testerman M.D on 07/23/2017 at 10:55 AM  Between 7am to 6pm - Pager - [phone]  After 6pm go to [email or web address] - password [email or web address] [facility]  Office  [phone]  CC: Primary care physician; Pauline Good, Thornell Mule, MD

## 2017-07-23 NOTE — ED Triage Notes (Addendum)
Pt to triage via w/c, appears uncomfortable, grimacing; reports lower abd pain accomp by N/V/D x 3 days; pt with hx chron's disease and recent D&C

## 2017-07-23 NOTE — ED Provider Notes (Signed)
C.diff antigen positive, toxin negative, pcr positive. Given copius diarrhea and abdominal cramping and elevated wbc c/w c.diff colitis. Vanco PO and admission to hospitalist   Lavonia Drafts, MD 07/23/17 [phone]

## 2017-07-24 DIAGNOSIS — A0472 Enterocolitis due to Clostridium difficile, not specified as recurrent: Principal | ICD-10-CM

## 2017-07-24 LAB — MAGNESIUM: Magnesium: 1.8 mg/dL (ref 1.7–2.4)

## 2017-07-24 LAB — CBC
HCT: 35.7 % (ref 35.0–47.0)
Hemoglobin: 12.1 g/dL (ref 12.0–16.0)
MCH: 30.8 pg (ref 26.0–34.0)
MCHC: 33.9 g/dL (ref 32.0–36.0)
MCV: 90.7 fL (ref 80.0–100.0)
PLATELETS: 207 10*3/uL (ref 150–440)
RBC: 3.94 MIL/uL (ref 3.80–5.20)
RDW: 14.1 % (ref 11.5–14.5)
WBC: 10.5 10*3/uL (ref 3.6–11.0)

## 2017-07-24 LAB — BASIC METABOLIC PANEL
Anion gap: 3 — ABNORMAL LOW (ref 5–15)
CALCIUM: 8.2 mg/dL — AB (ref 8.9–10.3)
CHLORIDE: 110 mmol/L (ref 101–111)
CO2: 23 mmol/L (ref 22–32)
CREATININE: 0.44 mg/dL (ref 0.44–1.00)
GFR calc Af Amer: >60 mL/min (ref >60)
GFR calc non Af Amer: >60 mL/min (ref >60)
GLUCOSE: 90 mg/dL (ref 65–99)
Potassium: 3.9 mmol/L (ref 3.5–5.1)
Sodium: 136 mmol/L (ref 135–145)

## 2017-07-24 LAB — PHOSPHORUS: PHOSPHORUS: 2.8 mg/dL (ref 2.5–4.6)

## 2017-07-24 NOTE — Progress Notes (Signed)
Pharmacy Electrolyte Monitoring Consult:  Pharmacy consulted to assist in monitoring and replacing electrolytes in this 27 y.o. female admitted on 07/23/2017 with clostridium difficile colitis.   Patient is currently ordered NS @ 177mL/hr.   Labs:  Sodium (mmol/L)  Date Value  07/24/2017 136  03/27/2014 141   Potassium (mmol/L)  Date Value  07/24/2017 3.9  03/27/2014 3.5   Magnesium (mg/dL)  Date Value  07/24/2017 1.8   Phosphorus (mg/dL)  Date Value  07/24/2017 2.8   Calcium (mg/dL)  Date Value  07/24/2017 8.2 (L)   Calcium, Total (mg/dL)  Date Value  03/27/2014 9.0   Albumin (g/dL)  Date Value  07/23/2017 4.1  03/23/2014 3.6    Assessment/Plan: Supplementation not required currently. Will recheck electrolytes with am labs.   Pharmacy will continue to monitor and adjust per consult.    Kathleen Owen A 07/24/2017 1:44 PM

## 2017-07-24 NOTE — Progress Notes (Signed)
[facility] at [place] NAME: Kathleen Owen    MR#:  [id number]  DATE OF BIRTH:  13-Dec-1990  SUBJECTIVE: 27 year old female patient with history of Crohn's disease [place] for diarrhea that is going on for 3 days associated with abdominal cramps, nausea.  Started on p.o. vancomycin, diarrhea is decreased but still has a lot of cramps and nausea.  CHIEF COMPLAINT:   Chief Complaint  Patient presents with  . Abdominal Pain    REVIEW OF SYSTEMS:   ROS CONSTITUTIONAL: No fever, fatigue or weakness.  EYES: No blurred or double vision.  EARS, NOSE, AND THROAT: No tinnitus or ear pain.  RESPIRATORY: No cough, shortness of breath, wheezing or hemoptysis.  CARDIOVASCULAR: No chest pain, orthopnea, edema.  GASTROINTESTINAL: No nausea, vomiting, diarrhea or abdominal pain.  GENITOURINARY: No dysuria, hematuria.  ENDOCRINE: No polyuria, nocturia,  HEMATOLOGY: No anemia, easy bruising or bleeding SKIN: No rash or lesion. MUSCULOSKELETAL: No joint pain or arthritis.   NEUROLOGIC: No tingling, numbness, weakness.  PSYCHIATRY: No anxiety or depression.   DRUG ALLERGIES:   Allergies  Allergen Reactions  . Aspirin     Chron's Disease  . Ibuprofen     Chron's Disease  . Amoxicillin Rash    VITALS:  Blood pressure 98/65, pulse (!) 59, temperature 98.1 F (36.7 C), temperature source Oral, resp. rate 18, height 5' 3"  (1.6 m), weight 72.6 kg (160 lb), SpO2 97 %.  PHYSICAL EXAMINATION:  GENERAL:  27 y.o.-year-old patient lying in the bed with no acute distress.  EYES: Pupils equal, round, reactive to light and accommodation. No scleral icterus. Extraocular muscles intact.  HEENT: Head atraumatic, normocephalic. Oropharynx and nasopharynx clear.  NECK:  Supple, no jugular venous distention. No thyroid enlargement, no tenderness.  LUNGS: Normal breath sounds bilaterally, no wheezing, rales,rhonchi or crepitation. No use of accessory muscles of  respiration.  CARDIOVASCULAR: S1, S2 normal. No murmurs, rubs, or gallops.  ABDOMEN: Soft, nontender, nondistended. Bowel sounds present. No organomegaly or mass.  EXTREMITIES: No pedal edema, cyanosis, or clubbing.  NEUROLOGIC: Cranial nerves II through XII are intact. Muscle strength 5/5 in all extremities. Sensation intact. Gait not checked.  PSYCHIATRIC: The patient is alert and oriented x 3.  SKIN: No obvious rash, lesion, or ulcer.    LABORATORY PANEL:   CBC Recent Labs  Lab 07/24/17 0631  WBC 10.5  HGB 12.1  HCT 35.7  PLT 207   ------------------------------------------------------------------------------------------------------------------  Chemistries  Recent Labs  Lab 07/23/17 0555 07/24/17 0631  NA 137 136  K 3.1* 3.9  CL 104 110  CO2 24 23  GLUCOSE 105* 90  BUN 9 <5*  CREATININE 0.63 0.44  CALCIUM 9.2 8.2*  MG 1.8 1.8  AST 24  --   ALT 14  --   ALKPHOS 52  --   BILITOT 0.4  --    ------------------------------------------------------------------------------------------------------------------  Cardiac Enzymes No results for input(s): TROPONINI in the last 168 hours. ------------------------------------------------------------------------------------------------------------------  RADIOLOGY:  Ct Abdomen Pelvis W Contrast  Result Date: 07/23/2017 CLINICAL DATA:  Diarrhea, nausea, vomiting, abdominal pain EXAM: CT ABDOMEN AND PELVIS WITH CONTRAST TECHNIQUE: Multidetector CT imaging of the abdomen and pelvis was performed using the standard protocol following bolus administration of intravenous contrast. CONTRAST:  170m ISOVUE-300 IOPAMIDOL (ISOVUE-300) INJECTION 61% COMPARISON:  07/26/2015 FINDINGS: Lower chest: Lung bases are clear. No effusions. Heart is normal size. Hepatobiliary: No focal hepatic abnormality. Gallbladder unremarkable. Pancreas: No focal abnormality or ductal dilatation. Spleen: No focal abnormality.  Normal size. Adrenals/Urinary Tract:  No adrenal abnormality. No focal renal abnormality. No stones or hydronephrosis. Urinary bladder is unremarkable. Stomach/Bowel: Normal appendix. Stomach, large and small bowel grossly unremarkable. Vascular/Lymphatic: No evidence of aneurysm or adenopathy. Reproductive: Probable anterior fundal fibroid measuring 4.5 cm. No adnexal mass. Other: No free fluid or free air. Musculoskeletal: No acute bony abnormality. IMPRESSION: No acute findings in the abdomen or pelvis. Uterine fibroid. Electronically Signed   By: Rolm Baptise M.D.   On: 07/23/2017 08:47    EKG:   Orders placed or performed during the hospital encounter of 08/27/16  . ED EKG within 10 minutes  . ED EKG within 10 minutes  . EKG 12-Lead  . EKG 12-Lead    ASSESSMENT AND PLAN:   27 year old female with history of Crohn's disease not on medicines came in because of profuse diarrhea for 3 days associated with abdominal cramps, nausea, vomiting found to have C. difficile colitis: Patient is on p.o. vancomycin, diarrhea is decreased but still had about 3 loose stools since last night, has nausea, abdominal cramps.  Continue vancomycin, GI consult placed, patient hemodynamically stable at this time.  Has normal kidney function, no leukocytosis.  Discussed with plan with patient, patient's relative in the room.    All the records are reviewed and case discussed with Care Management/Social Workerr. Management plans discussed with the patient, family and they are in agreement.  CODE STATUS: full  TOTAL TIME TAKING CARE OF THIS PATIENT: 35 minutes.   POSSIBLE D/C IN 1-2 DAYS, DEPENDING ON CLINICAL CONDITION.   Epifanio Lesches M.D on 07/24/2017 at 12:52 PM  Between 7am to 6pm - Pager - [phone]  After 6pm go to [email or web address] - password [email or web address] [facility]  Office  [phone]  CC: Primary care physician; Stoney Bang, MD   Note: This dictation was prepared with Dragon dictation along with  smaller phrase technology. Any transcriptional errors that result from this process are unintentional.

## 2017-07-24 NOTE — Progress Notes (Signed)
Pt complaining of increased BM. Three since 1900. Pt requesting immodium. Primary RN paged and spoke to Dr. Jannifer Franklin. No new orders at this time. Primary nurse to continue to monitor.

## 2017-07-24 NOTE — Consult Note (Signed)
Cephas Darby, MD [address]  [place]  [place], [place] [address]  Main: [phone]  Fax: [phone] Pager: [phone]   Consultation  Referring Provider:     No ref. provider found Primary Care Physician:  Pauline Good Thornell Mule, MD Primary Gastroenterologist:  Dr. Allen Norris         Reason for Consultation:     C. Difficile diarrhea  Date of Admission:  07/23/2017 Date of Consultation:  07/24/2017         HPI:   Kathleen Owen is a 27 y.o. African-American female with chronic tobacco use, ?Crohn's disease presents with 3 days history of abdominal pain associated with nausea vomiting and diarrhea. She reports that she has been having several bowel movements, when she was in the ER yesterday, she had stool studies performed C. Difficile antigen came back positive , subsequently PCR came back positive. Because of the severity of diarrhea and concern for dehydration and hypokalemia, patient is admitted. She started on oral vancomycin. She has significant leukocytosis with WBC count 24.8 yesterday, decreased to 10.5 today. She reports feeling better today, did not have BM today, feels nauseous and had 1 episode of emesis this morning. She continues to have lower abdominal pain. She denies fever, chills. She is able to tolerate clear liquid diet CT A/P with IV contrast during this admission did not reveal any evidence of polyps inflammation. She has uterine fibroid which is 4.5 cm in size  With regards to the diagnosis of Crohn's disease, patient is seen by Dr. Allen Norris, last visit in 10/2014, and she underwent colonoscopy which was unremarkable. Based on the blood work, she was given the diagnosis of possible Crohn's disease. She reports that she had flare up of symptoms including abdominal pain predominantly in the lower abdomen, associated with diarrhea. She had 2 flareups so far and was taking prednisone courses which provided partial relief. Her last flareup was about 1 year  ago. She smokes 1 pack per day She is currently unemployed Has a female sexual partner, no known HIV   NSAIDs: none  Antiplts/Anticoagulants/Anti thrombotics: none  GI Procedures: colonoscopy in 12/2013 normal DIAGNOSIS:  A. TERMINAL ILEUM; BIOPSY:  -SMALL BOWEL MUCOSA WITH INTACT VILLOUS ARCHITECTURE.  -NEGATIVE FOR DYSPLASIA AND MALIGNANCY.   B. COLON, RANDOM; COLD BIOPSY:  -COLONIC MUCOSA NEGATIVE FOR MICROSCOPIC COLITIS, DYSPLASIA AND  MALIGNANCY.  Upper endoscopy and small bowel follow-through negative  Past Medical History:  Diagnosis Date  . 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 CD (Crohn's disease) ([facility]) 07/30/2014  . Crohn's disease [facility]) Jan 2016  . Depression   . Miscarriage 07/18/2014    Past Surgical History:  Procedure Laterality Date  . DILATION AND CURETTAGE OF UTERUS    . DILATION AND EVACUATION N/A 12/02/2016   Procedure: DILATATION AND EVACUATION;  Surgeon: Harlin Heys, MD;  Location: [facility] ORS;  Service: Gynecology;  Laterality: N/A;  . DILATION AND EVACUATION N/A 06/30/2017   Procedure: DILATATION AND EVACUATION;  Surgeon: Harlin Heys, MD;  Location: [facility] ORS;  Service: Gynecology;  Laterality: N/A;  . TONSILLECTOMY      Prior to Admission medications   Medication Sig Start Date End Date Taking? Authorizing Provider  sertraline (ZOLOFT) 50 MG tablet Take 1 tablet (50 mg total) by mouth daily. 07/07/17 08/06/17 Yes Harlin Heys, MD    Family History  Problem Relation Age of Onset  . Kidney disease Maternal Uncle   .  Heart failure Maternal Uncle   . Heart failure Maternal Grandmother   . Hypertension Paternal Grandmother   . Thyroid disease Paternal Aunt      Social History   Tobacco Use  . Smoking status: Current Every Day Smoker    Packs/day: 0.50    Types: Cigarettes  . Smokeless tobacco: Never Used  Substance Use Topics  . Alcohol use: Yes    Comment: occasionally  . Drug  use: No    Allergies as of 07/23/2017 - Review Complete 07/23/2017  Allergen Reaction Noted  . Aspirin  02/28/2015  . Ibuprofen  02/28/2015  . Amoxicillin Rash 07/15/2014    Review of Systems:    All systems reviewed and negative except where noted in HPI.   Physical Exam:  Vital signs in last 24 hours: Temp:  [97.9 F (36.6 C)-98.1 F (36.7 C)] 98.1 F (36.7 C) (06/01 0440) Pulse Rate:  [51-59] 59 (06/01 0440) Resp:  [18] 18 (06/01 0440) BP: (87-98)/(56-65) 98/65 (06/01 0440) SpO2:  [97 %-98 %] 97 % (06/01 0440) Last BM Date: 07/23/17 General:   Pleasant, cooperative in NAD Head:  Normocephalic and atraumatic. Eyes:   No icterus.   Conjunctiva pink. PERRLA. Ears:  Normal auditory acuity. Neck:  Supple; no masses or thyroidomegaly Lungs: Respirations even and unlabored. Lungs clear to auscultation bilaterally.   No wheezes, crackles, or rhonchi.  Heart:  Regular rate and rhythm;  Without murmur, clicks, rubs or gallops Abdomen:  Soft, nondistended, diffuse lower abdominal tenderness. Normal bowel sounds. No appreciable masses or hepatomegaly.  No rebound or guarding.  Rectal:  Not performed. Msk:  Symmetrical without gross deformities.  Strength normal  Extremities:  Without edema, cyanosis or clubbing. Neurologic:  Alert and oriented x3;  grossly normal neurologically. Skin:  Intact without significant lesions or rashes. Cervical Nodes:  No significant cervical adenopathy. Psych:  Alert and cooperative. Normal affect.  LAB RESULTS: CBC Latest Ref Rng & Units 07/24/2017 07/23/2017 06/30/2017  WBC 3.6 - 11.0 K/uL 10.5 24.8(H) 17.7(H)  Hemoglobin 12.0 - 16.0 g/dL 12.1 14.5 13.2  Hematocrit 35.0 - 47.0 % 35.7 42.3 38.6  Platelets 150 - 440 K/uL 207 264 282    BMET BMP Latest Ref Rng & Units 07/24/2017 07/23/2017 12/01/2016  Glucose 65 - 99 mg/dL 90 105(H) 134(H)  BUN 6 - 20 mg/dL <5(L) 9 9  Creatinine 0.44 - 1.00 mg/dL 0.44 0.63 0.60  Sodium 135 - 145 mmol/L 136 137 138   Potassium 3.5 - 5.1 mmol/L 3.9 3.1(L) 2.8(L)  Chloride 101 - 111 mmol/L 110 104 105  CO2 22 - 32 mmol/L 23 24 21(L)  Calcium 8.9 - 10.3 mg/dL 8.2(L) 9.2 9.3    LFT Hepatic Function Latest Ref Rng & Units 07/23/2017 12/01/2016 09/21/2015  Total Protein 6.5 - 8.1 g/dL 7.5 7.8 7.4  Albumin 3.5 - 5.0 g/dL 4.1 3.9 4.3  AST 15 - 41 U/L _0 ALT 14 - 54 U/L _1 Alk Phosphatase 38 - 126 U/L 52 46 51  Total Bilirubin 0.3 - 1.2 mg/dL 0.4 0.4 0.4     STUDIES: Ct Abdomen Pelvis W Contrast  Result Date: 07/23/2017 CLINICAL DATA:  Diarrhea, nausea, vomiting, abdominal pain EXAM: CT ABDOMEN AND PELVIS WITH CONTRAST TECHNIQUE: Multidetector CT imaging of the abdomen and pelvis was performed using the standard protocol following bolus administration of intravenous contrast. CONTRAST:  144m ISOVUE-300 IOPAMIDOL (ISOVUE-300) INJECTION 61% COMPARISON:  07/26/2015 FINDINGS: Lower chest: Lung bases are clear. No effusions.  Heart is normal size. Hepatobiliary: No focal hepatic abnormality. Gallbladder unremarkable. Pancreas: No focal abnormality or ductal dilatation. Spleen: No focal abnormality.  Normal size. Adrenals/Urinary Tract: No adrenal abnormality. No focal renal abnormality. No stones or hydronephrosis. Urinary bladder is unremarkable. Stomach/Bowel: Normal appendix. Stomach, large and small bowel grossly unremarkable. Vascular/Lymphatic: No evidence of aneurysm or adenopathy. Reproductive: Probable anterior fundal fibroid measuring 4.5 cm. No adnexal mass. Other: No free fluid or free air. Musculoskeletal: No acute bony abnormality. IMPRESSION: No acute findings in the abdomen or pelvis. Uterine fibroid. Electronically Signed   By: Rolm Baptise M.D.   On: 07/23/2017 08:47      Impression / Plan:   Kathleen Owen is a 27 y.o. African-American female with chronic tobacco use, presents with 3 days history of nausea vomiting, abdominal pain and diarrhea, significant leukocytosis and was found  to have C. Difficile PCR positive and started on oral vancomycin for C. Difficile diarrhea. Patient does not have Crohn's disease based on the available data other than positive serologies which does not confirm the presence of Crohn's disease. Currently, she is clinically improving. Leukocytosis resolved  Continue oral vancomycin 125 mg 4 times daily for 14 days total Advance diet as tolerated Discussed with patient about further evaluation including video capsule endoscopy and repeat colonoscopy as outpatient to delineate the diagnosis of Crohn's disease Anticipate discharge home in next 1-2 days Follow up with [facility] in 2-3 weeks after discharge  Thank you for involving me in the care of this patient.  Will follow along with you    LOS: 1 day   Sherri Sear, MD  07/24/2017, 4:30 PM   Note: This dictation was prepared with Dragon dictation along with smaller phrase technology. Any transcriptional errors that result from this process are unintentional.

## 2017-07-25 LAB — BASIC METABOLIC PANEL
Anion gap: 7 (ref 5–15)
CALCIUM: 8.8 mg/dL — AB (ref 8.9–10.3)
CHLORIDE: 105 mmol/L (ref 101–111)
CO2: 26 mmol/L (ref 22–32)
CREATININE: 0.5 mg/dL (ref 0.44–1.00)
GFR calc non Af Amer: >60 mL/min (ref >60)
Glucose, Bld: 92 mg/dL (ref 65–99)
Potassium: 3.7 mmol/L (ref 3.5–5.1)
SODIUM: 138 mmol/L (ref 135–145)

## 2017-07-25 MED ORDER — RISAQUAD PO CAPS
2.0000 | ORAL_CAPSULE | Freq: Every day | ORAL | Status: DC
  Administered 2017-07-25 – 2017-07-26 (×2): 2 via ORAL
  Filled 2017-07-25 (×2): qty 2

## 2017-07-25 MED ORDER — BACID PO TABS
2.0000 | ORAL_TABLET | Freq: Three times a day (TID) | ORAL | Status: DC
  Filled 2017-07-25 (×2): qty 2

## 2017-07-25 MED ORDER — HYDROMORPHONE HCL 1 MG/ML IJ SOLN
0.5000 mg | Freq: Once | INTRAMUSCULAR | Status: AC
  Administered 2017-07-25: 0.5 mg via INTRAVENOUS
  Filled 2017-07-25: qty 0.5

## 2017-07-25 NOTE — Progress Notes (Signed)
[place] at [place] NAME: Kathleen Owen    MR#:  [id number]  DATE OF BIRTH:  1990-08-16  SUBJECTIVE: Admitted for ulcerative colitis, diarrhea is decreased patient had one episode of loose stool and some abdominal cramps, slight nausea.  Overall better but still has loose stool and she is concerned about that.  I called patient's mom and left a message.  CHIEF COMPLAINT:   Chief Complaint  Patient presents with  . Abdominal Pain    REVIEW OF SYSTEMS:   ROS CONSTITUTIONAL: No fever, fatigue or weakness.  EYES: No blurred or double vision.  EARS, NOSE, AND THROAT: No tinnitus or ear pain.  RESPIRATORY: No cough, shortness of breath, wheezing or hemoptysis.  CARDIOVASCULAR: No chest pain, orthopnea, edema.  GASTROINTESTINAL: No nausea, vomiting, diarrhea or abdominal pain.  GENITOURINARY: No dysuria, hematuria.  ENDOCRINE: No polyuria, nocturia,  HEMATOLOGY: No anemia, easy bruising or bleeding SKIN: No rash or lesion. MUSCULOSKELETAL: No joint pain or arthritis.   NEUROLOGIC: No tingling, numbness, weakness.  PSYCHIATRY: No anxiety or depression.   DRUG ALLERGIES:   Allergies  Allergen Reactions  . Aspirin     Chron's Disease  . Ibuprofen     Chron's Disease  . Amoxicillin Rash    VITALS:  Blood pressure 117/73, pulse (!) 52, temperature 98.2 F (36.8 C), temperature source Oral, resp. rate 16, height 5' 3"  (1.6 m), weight 72.6 kg (160 lb), SpO2 100 %.  PHYSICAL EXAMINATION:  GENERAL:  27 y.o.-year-old patient lying in the bed with no acute distress.  EYES: Pupils equal, round, reactive to light and accommodation. No scleral icterus. Extraocular muscles intact.  HEENT: Head atraumatic, normocephalic. Oropharynx and nasopharynx clear.  NECK:  Supple, no jugular venous distention. No thyroid enlargement, no tenderness.  LUNGS: Normal breath sounds bilaterally, no wheezing, rales,rhonchi or crepitation. No use of  accessory muscles of respiration.  CARDIOVASCULAR: S1, S2 normal. No murmurs, rubs, or gallops.  ABDOMEN: Soft, nontender, nondistended. Bowel sounds present. No organomegaly or mass.  EXTREMITIES: No pedal edema, cyanosis, or clubbing.  NEUROLOGIC: Cranial nerves II through XII are intact. Muscle strength 5/5 in all extremities. Sensation intact. Gait not checked.  PSYCHIATRIC: The patient is alert and oriented x 3.  SKIN: No obvious rash, lesion, or ulcer.    LABORATORY PANEL:   CBC Recent Labs  Lab 07/24/17 0631  WBC 10.5  HGB 12.1  HCT 35.7  PLT 207   ------------------------------------------------------------------------------------------------------------------  Chemistries  Recent Labs  Lab 07/23/17 0555 07/24/17 0631 07/25/17 0755  NA 137 136 138  K 3.1* 3.9 3.7  CL 104 110 105  CO2 24 23 26   GLUCOSE 105* 90 92  BUN 9 <5* <5*  CREATININE 0.63 0.44 0.50  CALCIUM 9.2 8.2* 8.8*  MG 1.8 1.8  --   AST 24  --   --   ALT 14  --   --   ALKPHOS 52  --   --   BILITOT 0.4  --   --    ------------------------------------------------------------------------------------------------------------------  Cardiac Enzymes No results for input(s): TROPONINI in the last 168 hours. ------------------------------------------------------------------------------------------------------------------  RADIOLOGY:  No results found.  EKG:   Orders placed or performed during the hospital encounter of 08/27/16  . ED EKG within 10 minutes  . ED EKG within 10 minutes  . EKG 12-Lead  . EKG 12-Lead    ASSESSMENT AND PLAN:   27 year old female with history of Crohn's disease not on medicines came in  because of profuse diarrhea for 3 days associated with abdominal cramps, nausea, vomiting found to have C. difficile colitis: Diarrhea is better.  Continue vancomycin, add probiotic.  Discussed with patient's mother over the phone.   hemodynamically stable at this time.  Has normal  kidney function, no leukocytosis.   2 possible Crohn's, according to gastroenterology note patient has only positive serology for Crohn's, she is advised that patient can have repeat colonoscopy or video capsule endoscopy as an outpatient to delineate a diagnosis of Crohn's disease.   All the records are reviewed and case discussed with Care Management/Social Workerr. Management plans discussed with the patient, family and they are in agreement.  CODE STATUS: full  TOTAL TIME TAKING CARE OF THIS PATIENT: 35 minutes.   POSSIBLE D/C IN 1-2 DAYS, DEPENDING ON CLINICAL CONDITION.   Epifanio Lesches M.D on 07/25/2017 at 11:51 AM  Between 7am to 6pm - Pager - [phone]  After 6pm go to [email or web address] - password [email or web address] [facility]  Office  [phone]  CC: Primary care physician; Stoney Bang, MD   Note: This dictation was prepared with Dragon dictation along with smaller phrase technology. Any transcriptional errors that result from this process are unintentional.

## 2017-07-25 NOTE — Progress Notes (Signed)
Pt complaining of pain throughout shift. Pt given Norco twice; Toradol twice. Pt rates pain at a 8 out of 10. Primary nurse paged and spoke to Dr. Marcille Blanco. Orders received for Dilaudid once. Primary nurse to continue to monitor.

## 2017-07-25 NOTE — Progress Notes (Signed)
Pharmacy Electrolyte Monitoring Consult:  Pharmacy consulted to assist in monitoring and replacing electrolytes in this 27 y.o. female admitted on 07/23/2017 with clostridium difficile colitis.   Patient is currently ordered NS @ 85mL/hr.   Labs:  Sodium (mmol/L)  Date Value  07/25/2017 138  03/27/2014 141   Potassium (mmol/L)  Date Value  07/25/2017 3.7  03/27/2014 3.5   Magnesium (mg/dL)  Date Value  07/24/2017 1.8   Phosphorus (mg/dL)  Date Value  07/24/2017 2.8   Calcium (mg/dL)  Date Value  07/25/2017 8.8 (L)   Calcium, Total (mg/dL)  Date Value  03/27/2014 9.0   Albumin (g/dL)  Date Value  07/23/2017 4.1  03/23/2014 3.6    Assessment/Plan: Supplementation not required currently. Will recheck electrolytes with am labs.   Pharmacy will continue to monitor and adjust per consult.    Sofhia Ulibarri A 07/25/2017 10:27 AM

## 2017-07-25 NOTE — Progress Notes (Signed)
Cephas Darby, MD [address]  [place]  [place], [place] [address]  Main: [phone]  Fax: [phone] Pager: [phone]   Subjective: She had hamburger and french fries for lunch today. She reports having one loose bowel movement today and reports mild abdominal pain. She does not want to go home today. Her mom is bedside, patient is sleeping and appears comfortable in bed   Objective: Vital signs in last 24 hours: Vitals:   07/24/17 0440 07/24/17 1741 07/24/17 2057 07/25/17 0423  BP: 98/65 111/75 95/66 117/73  Pulse: (!) 59 (!) 58 (!) 57 (!) 52  Resp: 18 16 16 16   Temp: 98.1 F (36.7 C) 97.8 F (36.6 C) 98 F (36.7 C) 98.2 F (36.8 C)  TempSrc: Oral Oral Oral Oral  SpO2: 97% 100% 100% 100%  Weight:      Height:       Weight change:   Intake/Output Summary (Last 24 hours) at 07/25/2017 1621 Last data filed at 07/24/2017 1859 Gross per 24 hour  Intake 1028 ml  Output -  Net 1028 ml     Exam: Heart:: Regular rate and rhythm or S1S2 present Lungs: clear to auscultation Abdomen: soft, nontender, normal bowel sounds   Lab Results: CBC Latest Ref Rng & Units 07/24/2017 07/23/2017 06/30/2017  WBC 3.6 - 11.0 K/uL 10.5 24.8(H) 17.7(H)  Hemoglobin 12.0 - 16.0 g/dL 12.1 14.5 13.2  Hematocrit 35.0 - 47.0 % 35.7 42.3 38.6  Platelets 150 - 440 K/uL 207 264 282   Micro Results: Recent Results (from the past 240 hour(s))  Gastrointestinal Panel by PCR , Stool     Status: None   Collection Time: 07/23/17  5:56 AM  Result Value Ref Range Status   Campylobacter species NOT DETECTED NOT DETECTED Final   Plesimonas shigelloides NOT DETECTED NOT DETECTED Final   Salmonella species NOT DETECTED NOT DETECTED Final   Yersinia enterocolitica NOT DETECTED NOT DETECTED Final   Vibrio species NOT DETECTED NOT DETECTED Final   Vibrio cholerae NOT DETECTED NOT DETECTED Final   Enteroaggregative E coli (EAEC) NOT DETECTED NOT DETECTED Final   Enteropathogenic E coli  (EPEC) NOT DETECTED NOT DETECTED Final   Enterotoxigenic E coli (ETEC) NOT DETECTED NOT DETECTED Final   Shiga like toxin producing E coli (STEC) NOT DETECTED NOT DETECTED Final   Shigella/Enteroinvasive E coli (EIEC) NOT DETECTED NOT DETECTED Final   Cryptosporidium NOT DETECTED NOT DETECTED Final   Cyclospora cayetanensis NOT DETECTED NOT DETECTED Final   Entamoeba histolytica NOT DETECTED NOT DETECTED Final   Giardia lamblia NOT DETECTED NOT DETECTED Final   Adenovirus F40/41 NOT DETECTED NOT DETECTED Final   Astrovirus NOT DETECTED NOT DETECTED Final   Norovirus GI/GII NOT DETECTED NOT DETECTED Final   Rotavirus A NOT DETECTED NOT DETECTED Final   Sapovirus (I, II, IV, and V) NOT DETECTED NOT DETECTED Final    Comment: Performed at [facility], [address] [place], [place] [address]  C difficile quick scan w PCR reflex     Status: Abnormal   Collection Time: 07/23/17  5:56 AM  Result Value Ref Range Status   C Diff antigen POSITIVE (A) NEGATIVE Final   C Diff toxin NEGATIVE NEGATIVE Final   C Diff interpretation Results are indeterminate. See PCR results.  Final    Comment: Performed at [facility], [address] [place], [place] [address]  C. Diff by PCR, Reflexed     Status: Abnormal   Collection  Time: 07/23/17  5:56 AM  Result Value Ref Range Status   Toxigenic C. Difficile by PCR POSITIVE (A) NEGATIVE Final    Comment: Positive for toxigenic C. difficile with little to no toxin production. Only treat if clinical presentation suggests symptomatic illness. Performed at [facility], [address] [place], [place] [address]    Studies/Results: No results found. Medications: I have reviewed the patient's current medications. Scheduled Meds: . acidophilus  2 capsule Oral Daily  . enoxaparin (LOVENOX) injection  40 mg Subcutaneous Q24H  . nicotine  14 mg Transdermal Daily  . sertraline  50 mg Oral QHS  . vancomycin  125 mg Oral QID    Continuous Infusions: PRN Meds:.acetaminophen **OR** acetaminophen, HYDROcodone-acetaminophen, ketorolac, ondansetron **OR** ondansetron (ZOFRAN) IV   Assessment: Active Problems:   C. difficile colitis clinically improving  Plan: Continue oral vancomycin 125 mg 4 times daily  Anticipate discharge home tomorrow and follow up with GI in 2 weeks We will sign off and please call us back with questions   LOS: 2 days   Sherri Mcarthy 07/25/2017, 4:21 PM

## 2017-07-26 LAB — BASIC METABOLIC PANEL
ANION GAP: 8 (ref 5–15)
BUN: 6 mg/dL (ref 6–20)
CALCIUM: 8.8 mg/dL — AB (ref 8.9–10.3)
CO2: 26 mmol/L (ref 22–32)
Chloride: 104 mmol/L (ref 101–111)
Creatinine, Ser: 0.57 mg/dL (ref 0.44–1.00)
GFR calc non Af Amer: >60 mL/min (ref >60)
Glucose, Bld: 101 mg/dL — ABNORMAL HIGH (ref 65–99)
Potassium: 3.5 mmol/L (ref 3.5–5.1)
Sodium: 138 mmol/L (ref 135–145)

## 2017-07-26 LAB — PHOSPHORUS: Phosphorus: 3.5 mg/dL (ref 2.5–4.6)

## 2017-07-26 LAB — MAGNESIUM: MAGNESIUM: 1.9 mg/dL (ref 1.7–2.4)

## 2017-07-26 LAB — HIV ANTIBODY (ROUTINE TESTING W REFLEX): HIV Screen 4th Generation wRfx: NONREACTIVE

## 2017-07-26 MED ORDER — POTASSIUM CHLORIDE CRYS ER 20 MEQ PO TBCR
40.0000 meq | EXTENDED_RELEASE_TABLET | Freq: Once | ORAL | Status: AC
  Administered 2017-07-26: 40 meq via ORAL
  Filled 2017-07-26: qty 2

## 2017-07-26 MED ORDER — RISAQUAD PO CAPS: 2.0000 | ORAL_CAPSULE | Freq: Every day | ORAL | 0 refills | Status: DC

## 2017-07-26 MED ORDER — MAGNESIUM SULFATE 2 GM/50ML IV SOLN
2.0000 g | Freq: Once | INTRAVENOUS | Status: AC
  Administered 2017-07-26: 2 g via INTRAVENOUS
  Filled 2017-07-26: qty 50

## 2017-07-26 MED ORDER — VANCOMYCIN 50 MG/ML ORAL SOLUTION: 125.0000 mg | Freq: Four times a day (QID) | ORAL | 0 refills | Status: DC

## 2017-07-26 MED ORDER — ONDANSETRON HCL 4 MG PO TABS: 4.0000 mg | ORAL_TABLET | Freq: Four times a day (QID) | ORAL | 0 refills | Status: DC | PRN

## 2017-07-26 NOTE — Care Management (Signed)
Patient to discharge home on oral vancomycin suspension.  Patient does not have insurance.  Vancomycin suspension is not compounded in local pharmacies.  Capsules would be too costly.  [facility] pharmacy will provide the vancomycin suspension.  Updated patient and her primary nurse.

## 2017-07-26 NOTE — Discharge Summary (Signed)
Kathleen Owen, is a 27 y.o. female  DOB 1990-11-14  MRN [id number].  Admission date:  07/23/2017  Admitting Physician  Bettey Costa, MD  Discharge Date:  07/26/2017   Primary MD  Pauline Good, Thornell Mule, MD  Recommendations for primary care physician for things to follow:   Follow-up with PCP in 1 week    Admission Diagnosis  Generalized abdominal pain [R10.84] Clostridium difficile colitis [A04.72] Intractable diarrhea [R19.7] Non-intractable vomiting with nausea, unspecified vomiting type [R11.2]   Discharge Diagnosis  Generalized abdominal pain [R10.84] Clostridium difficile colitis [A04.72] Intractable diarrhea [R19.7] Non-intractable vomiting with nausea, unspecified vomiting type [R11.2]    Active Problems:   C. difficile colitis      Past Medical History:  Diagnosis Date  . 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 CD (Crohn's disease) ([place]) 07/30/2014  . Crohn's disease [facility]) Jan 2016  . Depression   . Miscarriage 07/18/2014    Past Surgical History:  Procedure Laterality Date  . DILATION AND CURETTAGE OF UTERUS    . DILATION AND EVACUATION N/A 12/02/2016   Procedure: DILATATION AND EVACUATION;  Surgeon: Harlin Heys, MD;  Location: [facility] ORS;  Service: Gynecology;  Laterality: N/A;  . DILATION AND EVACUATION N/A 06/30/2017   Procedure: DILATATION AND EVACUATION;  Surgeon: Harlin Heys, MD;  Location: [facility] ORS;  Service: Gynecology;  Laterality: N/A;  . TONSILLECTOMY         History of present illness and  Hospital Course:     Kindly see H&P for history of present illness and admission details, please review complete Labs, Consult reports and Test reports for all details in brief  HPI  from the history and physical done on the day of  admission 27 year old female patient admitted for diarrhea, found to have C. difficile colitis with PCR for C. difficile positive.   Hospital Course  For C. difficile colitis.  Admitted to hospitalist service, started on p.o. vancomycin.  Initial blood count WBC 24.8 decreased to 10.5.  Patient denies any decreased, she had some abdominal cramps required Toradol, Percocet.  She is able to tolerate regular food, patient had CT abdomen and pelvis with IV contrast during the admission did not reveal any acute inflammation.  Diarrhea is less, tolerating the diet.  Afebrile.  Discharging home with vancomycin 125 mg p.o. 4 times daily for 11 days.  Patient needs 11 more days.  She finished 3 days of vancomycin here.  Total course of antibiotic duration is 14 days of vancomycin p.o. for C. difficile colitis. 2.  Crohn's disease as per patient but gastroenterologist that patient does not have Crohn's disease based on the date other than positive serology.  She recommended that patient can follow-up with them as an outpatient for video capsule endoscopy or repeat colonoscopy as an outpatient to delineate diagnosis of Crohn's disease.  Same discussed with patient.  Patient likely had IBS. 3.  Hypomagnesemia: Improved with supplementation.     Discharge Condition: Stable   Follow UP  Follow-up Information    Vanga, Tally Due, MD. Schedule an appointment as soon as possible for a visit in 2 week(s).   Specialty:  Gastroenterology Contact information: [place] [address] [phone]             Discharge Instructions  and  Discharge Medications      Allergies as of 07/26/2017      Reactions   Aspirin  Chron's Disease   Ibuprofen    Chron's Disease   Amoxicillin Rash      Medication List    TAKE these medications   acidophilus Caps capsule Take 2 capsules by mouth daily.   ondansetron 4 MG tablet Commonly known as:  ZOFRAN Take 1 tablet (4 mg total) by  mouth every 6 (six) hours as needed for nausea.   sertraline 50 MG tablet Commonly known as:  ZOLOFT Take 1 tablet (50 mg total) by mouth daily.   vancomycin 50 mg/mL oral solution Commonly known as:  VANCOCIN Take 2.5 mLs (125 mg total) by mouth 4 (four) times daily. For 11 days         Diet and Activity recommendation: See Discharge Instructions above   Consults obtained; gastroenterology  Major procedures and Radiology Reports - PLEASE review detailed and final reports for all details, in brief -     US Ob Transvaginal  Result Date: 07/13/2017 ULTRASOUND REPORT Location: [facility] Date of Service:  06/28/2017 Indications: F/U MAB; Patient confirmation Findings: Similar appearing scan to previous two.  Two gestational sacs are again noted within the uterus.  Gestational sac A measures approx. [redacted] weeks gestation and no products of conception are identified within. Gestational sac B measures approx. [redacted] weeks gestation.  A yolk sac is not identified on today's scan.  The probable fetal pole is visualized measuring 5 6/[redacted] weeks gestation.  Fetal heart tones could not be identified on m-mode, color, or pulsed-wave doppler.  This similar in appearance to previous scan. FHR: No fetal heart tones CRL measurement: 2.8 mm probable fetal pole in Gestational sac B Survey of the adnexa demonstrates no adnexal masses. There is no free peritoneal fluid in the cul de sac. Impression: 1. Similar appearing ultrasound to two previous scans.  2. No products of conception are identified within gestational sac A. 3. The previous identified fetal pole in gestational sac B measures 5 6/[redacted] weeks gestation.  Fetal heart tones could not be identified. 4. Non-viable intrauterine pregnancy Recommendations: 1.Clinical correlation with the patient's History and Physical Exam. Dario Ave, RDMS The ultrasound images and findings were reviewed by me and I agree with the above report. Finis Bud, M.D. 07/13/2017  3:25 PM   Ct Abdomen Pelvis W Contrast  Result Date: 07/23/2017 CLINICAL DATA:  Diarrhea, nausea, vomiting, abdominal pain EXAM: CT ABDOMEN AND PELVIS WITH CONTRAST TECHNIQUE: Multidetector CT imaging of the abdomen and pelvis was performed using the standard protocol following bolus administration of intravenous contrast. CONTRAST:  161m ISOVUE-300 IOPAMIDOL (ISOVUE-300) INJECTION 61% COMPARISON:  07/26/2015 FINDINGS: Lower chest: Lung bases are clear. No effusions. Heart is normal size. Hepatobiliary: No focal hepatic abnormality. Gallbladder unremarkable. Pancreas: No focal abnormality or ductal dilatation. Spleen: No focal abnormality.  Normal size. Adrenals/Urinary Tract: No adrenal abnormality. No focal renal abnormality. No stones or hydronephrosis. Urinary bladder is unremarkable. Stomach/Bowel: Normal appendix. Stomach, large and small bowel grossly unremarkable. Vascular/Lymphatic: No evidence of aneurysm or adenopathy. Reproductive: Probable anterior fundal fibroid measuring 4.5 cm. No adnexal mass. Other: No free fluid or free air. Musculoskeletal: No acute bony abnormality. IMPRESSION: No acute findings in the abdomen or pelvis. Uterine fibroid. Electronically Signed   By: KRolm BaptiseM.D.   On: 07/23/2017 08:47    Micro Results     Recent Results (from the past 240 hour(s))  Gastrointestinal Panel by PCR , Stool     Status: None   Collection Time: 07/23/17  5:56 AM  Result Value  Ref Range Status   Campylobacter species NOT DETECTED NOT DETECTED Final   Plesimonas shigelloides NOT DETECTED NOT DETECTED Final   Salmonella species NOT DETECTED NOT DETECTED Final   Yersinia enterocolitica NOT DETECTED NOT DETECTED Final   Vibrio species NOT DETECTED NOT DETECTED Final   Vibrio cholerae NOT DETECTED NOT DETECTED Final   Enteroaggregative E coli (EAEC) NOT DETECTED NOT DETECTED Final   Enteropathogenic E coli (EPEC) NOT DETECTED NOT DETECTED Final   Enterotoxigenic E coli (ETEC) NOT  DETECTED NOT DETECTED Final   Shiga like toxin producing E coli (STEC) NOT DETECTED NOT DETECTED Final   Shigella/Enteroinvasive E coli (EIEC) NOT DETECTED NOT DETECTED Final   Cryptosporidium NOT DETECTED NOT DETECTED Final   Cyclospora cayetanensis NOT DETECTED NOT DETECTED Final   Entamoeba histolytica NOT DETECTED NOT DETECTED Final   Giardia lamblia NOT DETECTED NOT DETECTED Final   Adenovirus F40/41 NOT DETECTED NOT DETECTED Final   Astrovirus NOT DETECTED NOT DETECTED Final   Norovirus GI/GII NOT DETECTED NOT DETECTED Final   Rotavirus A NOT DETECTED NOT DETECTED Final   Sapovirus (I, II, IV, and V) NOT DETECTED NOT DETECTED Final    Comment: Performed at [facility], [address] [place], [place] [address]  C difficile quick scan w PCR reflex     Status: Abnormal   Collection Time: 07/23/17  5:56 AM  Result Value Ref Range Status   C Diff antigen POSITIVE (A) NEGATIVE Final   C Diff toxin NEGATIVE NEGATIVE Final   C Diff interpretation Results are indeterminate. See PCR results.  Final    Comment: Performed at [facility], [address] [place], [place] [address]  C. Diff by PCR, Reflexed     Status: Abnormal   Collection Time: 07/23/17  5:56 AM  Result Value Ref Range Status   Toxigenic C. Difficile by PCR POSITIVE (A) NEGATIVE Final    Comment: Positive for toxigenic C. difficile with little to no toxin production. Only treat if clinical presentation suggests symptomatic illness. Performed at [facility], [address] [place], [place] [address]        Today   Subjective:   Kathleen Owen today has no headache,no chest abdominal pain,no new weakness tingling or numbness, feels much better wants to go home today.  Diarrhea decreased.  Objective:   Blood pressure 97/63, pulse (!) 54, temperature 98 F (36.7 C), temperature source Oral, resp. rate 16, height 5' 3"  (1.6 m), weight 72.6 kg (160 lb), SpO2 99 %.  No intake or output  data in the 24 hours ending 07/26/17 0937  Exam Awake Alert, Oriented x 3, No new F.N deficits, Normal affect [place].AT,PERRAL Supple Neck,No JVD, No cervical lymphadenopathy appriciated.  Symmetrical Chest wall movement, Good air movement bilaterally, CTAB RRR,No Gallops,Rubs or new Murmurs, No Parasternal Heave +ve B.Sounds, Abd Soft, Non tender, No organomegaly appriciated, No rebound -guarding or rigidity. No Cyanosis, Clubbing or edema, No new Rash or bruise  Data Review   CBC w Diff:  Lab Results  Component Value Date   WBC 10.5 07/24/2017   HGB 12.1 07/24/2017   HGB 13.6 03/27/2014   HCT 35.7 07/24/2017   HCT 41.4 03/27/2014   PLT 207 07/24/2017   PLT 276 03/27/2014   LYMPHOPCT 17 07/23/2017   LYMPHOPCT 22.2 03/23/2014   MONOPCT 6 07/23/2017   MONOPCT 7.7 03/23/2014   EOSPCT 2 07/23/2017   EOSPCT 2.8 03/23/2014   BASOPCT 1 07/23/2017   BASOPCT 0.3 03/23/2014  CMP:  Lab Results  Component Value Date   NA 138 07/26/2017   NA 141 03/27/2014   K 3.5 07/26/2017   K 3.5 03/27/2014   CL 104 07/26/2017   CL 107 03/27/2014   CO2 26 07/26/2017   CO2 26 03/27/2014   BUN 6 07/26/2017   BUN 11 03/27/2014   CREATININE 0.57 07/26/2017   CREATININE 0.83 03/27/2014   PROT 7.5 07/23/2017   PROT 7.6 03/23/2014   ALBUMIN 4.1 07/23/2017   ALBUMIN 3.6 03/23/2014   BILITOT 0.4 07/23/2017   BILITOT 0.4 03/23/2014   ALKPHOS 52 07/23/2017   ALKPHOS 57 03/23/2014   AST 24 07/23/2017   AST 22 03/23/2014   ALT 14 07/23/2017   ALT 20 03/23/2014  . Spoke to patient's mom yesterday about discharge today with p.o. vancomycin.  Total Time in preparing paper work, data evaluation and todays exam - 51 minutes  Epifanio Lesches M.D on 07/26/2017 at 9:37 AM    Note: This dictation was prepared with Dragon dictation along with smaller phrase technology. Any transcriptional errors that result from this process are unintentional.

## 2017-07-26 NOTE — Progress Notes (Signed)
Pharmacy Electrolyte Monitoring Consult:  Pharmacy consulted to assist in monitoring and replacing electrolytes in this 27 y.o. female admitted on 07/23/2017 with clostridium difficile colitis.   Patient is currently ordered NS @ 18mL/hr.   Labs:  Sodium (mmol/L)  Date Value  07/26/2017 138  03/27/2014 141   Potassium (mmol/L)  Date Value  07/26/2017 3.5  03/27/2014 3.5   Magnesium (mg/dL)  Date Value  07/26/2017 1.9   Phosphorus (mg/dL)  Date Value  07/26/2017 3.5   Calcium (mg/dL)  Date Value  07/26/2017 8.8 (L)   Calcium, Total (mg/dL)  Date Value  03/27/2014 9.0   Albumin (g/dL)  Date Value  07/23/2017 4.1  03/23/2014 3.6    Assessment/Plan: K dropped to 3.5- since K has trended down and pt is still having loose stools, I will give KCL 40 MEW once. Recheck K in the AM.  Pharmacy will continue to monitor and adjust per consult.    Ramond Dial, Pharm.D, BCPS Clinical Pharmacist  07/26/2017 7:32 AM

## 2017-07-27 LAB — CALPROTECTIN, FECAL

## 2017-07-28 ENCOUNTER — Encounter: Payer: Medicaid Other | Admitting: Obstetrics and Gynecology

## 2017-08-13 ENCOUNTER — Ambulatory Visit: Payer: Medicaid Other | Admitting: Gastroenterology

## 2017-09-22 LAB — HM HIV SCREENING LAB: HM HIV Screening: NEGATIVE

## 2017-09-27 ENCOUNTER — Ambulatory Visit: Payer: Self-pay | Admitting: Gastroenterology

## 2017-09-27 ENCOUNTER — Other Ambulatory Visit: Payer: Self-pay

## 2017-09-27 ENCOUNTER — Encounter: Payer: Self-pay | Admitting: Gastroenterology

## 2017-09-27 VITALS — BP 101/68 | HR 73 | Ht 63.0 in | Wt 158.6 lb

## 2017-09-27 DIAGNOSIS — A0472 Enterocolitis due to Clostridium difficile, not specified as recurrent: Secondary | ICD-10-CM

## 2017-09-27 MED ORDER — DICYCLOMINE HCL 10 MG PO CAPS: 10.0000 mg | ORAL_CAPSULE | Freq: Three times a day (TID) | ORAL | 0 refills | Status: DC

## 2017-09-27 NOTE — Progress Notes (Signed)
Cephas Darby, MD [address]  [place]  [place], [place] [address]  Main: [phone]  Fax: [phone]    Gastroenterology Consultation  Referring Provider:     Stoney Bang,* Primary Care Physician:  Stoney Bang, MD Primary Gastroenterologist:  Dr. Allen Norris Reason for Consultation:     C. Difficile diarrhea        HPI:   LYNNMARIE LOVETT is a 27 y.o. female referred by Dr. Pauline Good, Thornell Mule, MD  for consultation & management of C. Difficile diarrhea. Patient was admitted to [facility] about 2 months ago with 3 days history of lower abdominal pain, nausea vomiting and diarrhea. she had stool studies performed C. Difficile antigen came back positive , subsequently PCR came back positive. J pathogen panel was negative, fecal calprotectin was normal.Because of the severity of diarrhea and concern for dehydration and hypokalemia, patient was admitted. She was started on oral vancomycin for 14 days. She had significant leukocytosis with WBC count 24.8 on admission, decreased to 10.5 on antibiotics. CT A/P with IV contrast during this admission did not reveal any evidence of polyps inflammation. She has uterine fibroid which is 4.5 cm in size  With regards to the diagnosis of ?Crohn's disease, patient is seen by Dr. Allen Norris, last visit in 10/2014, and she underwent colonoscopy which was unremarkable. Based on the blood work, she was given the diagnosis of possible Crohn's disease. She reports that she had flare up of symptoms including abdominal pain predominantly in the lower abdomen, associated with diarrhea. She had 2 flareups so far and was taking prednisone courses which provided partial relief. Her last flareup was about 1 year ago. She smokes 1 pack per day She is currently unemployed Has a female sexual partner, no known HIV  Interval history: she reports recurrence of symptoms after completion of oral vancomycin. Her weight has been  stable. She continues to have 3-4 loose nonbloody bowel movements associated with severe lower abdominal cramps for which she is taking extra strength Tylenol every 4-6 hours. She is accompanied by her mom today  NSAIDs: none  Antiplts/Anticoagulants/Anti thrombotics: none  GI Procedures: colonoscopy in 12/2013 normal DIAGNOSIS:  A. TERMINAL ILEUM; BIOPSY:  -SMALL BOWEL MUCOSA WITH INTACT VILLOUS ARCHITECTURE.  -NEGATIVE FOR DYSPLASIA AND MALIGNANCY.   B. COLON, RANDOM; COLD BIOPSY:  -COLONIC MUCOSA NEGATIVE FOR MICROSCOPIC COLITIS, DYSPLASIA AND  MALIGNANCY.  Upper endoscopy and small bowel follow-through negative   Past Medical History:  Diagnosis Date  . 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 CD (Crohn's disease) ([place]) 07/30/2014  . Crohn's disease [facility]) Jan 2016  . Depression   . Miscarriage 07/18/2014    Past Surgical History:  Procedure Laterality Date  . DILATION AND CURETTAGE OF UTERUS    . DILATION AND EVACUATION N/A 12/02/2016   Procedure: DILATATION AND EVACUATION;  Surgeon: Harlin Heys, MD;  Location: [facility] ORS;  Service: Gynecology;  Laterality: N/A;  . DILATION AND EVACUATION N/A 06/30/2017   Procedure: DILATATION AND EVACUATION;  Surgeon: Harlin Heys, MD;  Location: [facility] ORS;  Service: Gynecology;  Laterality: N/A;  . TONSILLECTOMY      Current Outpatient Medications:  .  acidophilus (RISAQUAD) CAPS capsule, Take 2 capsules by mouth daily. (Patient not taking: Reported on 09/27/2017), Disp: 60 capsule, Rfl: 0 .  dicyclomine (BENTYL) 10 MG capsule, Take 1 capsule (10 mg total) by mouth 4 (four) times daily -  before  meals and at bedtime., Disp: 120 capsule, Rfl: 0 .  ondansetron (ZOFRAN) 4 MG tablet, Take 1 tablet (4 mg total) by mouth every 6 (six) hours as needed for nausea. (Patient not taking: Reported on 09/27/2017), Disp: 20 tablet, Rfl: 0 .  sertraline (ZOLOFT) 50 MG tablet, Take 1 tablet (50 mg  total) by mouth daily., Disp: 30 tablet, Rfl: 1 .  vancomycin (VANCOCIN) 50 mg/mL oral solution, Take 2.5 mLs (125 mg total) by mouth 4 (four) times daily. For 11 days (Patient not taking: Reported on 09/27/2017), Disp: 110 mL, Rfl: 0   Family History  Problem Relation Age of Onset  . Kidney disease Maternal Uncle   . Heart failure Maternal Uncle   . Heart failure Maternal Grandmother   . Hypertension Paternal Grandmother   . Thyroid disease Paternal Aunt      Social History   Tobacco Use  . Smoking status: Current Every Day Smoker    Packs/day: 0.50    Types: Cigarettes  . Smokeless tobacco: Never Used  Substance Use Topics  . Alcohol use: Yes    Comment: occasionally  . Drug use: No    Allergies as of 09/27/2017 - Review Complete 09/27/2017  Allergen Reaction Noted  . Aspirin  02/28/2015  . Ibuprofen  02/28/2015  . Amoxicillin Rash 07/15/2014    Review of Systems:    All systems reviewed and negative except where noted in HPI.   Physical Exam:  BP 101/68   Pulse 73   Ht 5\' 3"  (1.6 m)   Wt 158 lb 9.6 oz (71.9 kg)   BMI 28.09 kg/m  No LMP recorded.  General:   Alert,  Well-developed, well-nourished, pleasant and cooperative in NAD Head:  Normocephalic and atraumatic. Eyes:  Sclera clear, no icterus.   Conjunctiva pink. Ears:  Normal auditory acuity. Nose:  No deformity, discharge, or lesions. Mouth:  No deformity or lesions,oropharynx pink & moist. Neck:  Supple; no masses or thyromegaly. Lungs:  Respirations even and unlabored.  Clear throughout to auscultation.   No wheezes, crackles, or rhonchi. No acute distress. Heart:  Regular rate and rhythm; no murmurs, clicks, rubs, or gallops. Abdomen:  Normal bowel sounds. Soft, mild to moderate lower abdominal tenderness and non-distended without masses, hepatosplenomegaly or hernias noted.  No guarding or rebound tenderness.   Rectal: Not performed Msk:  Symmetrical without gross deformities. Good, equal movement &  strength bilaterally. Pulses:  Normal pulses noted. Extremities:  No clubbing or edema.  No cyanosis. Neurologic:  Alert and oriented x3;  grossly normal neurologically. Skin:  Intact without significant lesions or rashes. No jaundice. Lymph Nodes:  No significant cervical adenopathy. Psych:  Alert and cooperative. Normal mood and affect.  Imaging Studies: Reviewed  Assessment and Plan:   EVELETTE HOLLERN is a 27 y.o. African-American female with chronic tobacco use with C Diff diarrhea in 07/2017 s/p 2 weeks course of oral vancomycin. Patient does not have Crohn's disease based on the available data other than positive serologies which does not confirm the presence of Crohn's disease. She is here for follow-up of her ongoing symptoms of diarrhea and lower abdominal pain  - Recheck stool studies to rule out C. Difficile - schedule colonoscopy - Trial of Bentyl 10 mg every 4-6 hours  Follow up in 4 weeks   Cephas Darby, MD

## 2017-09-28 ENCOUNTER — Other Ambulatory Visit
Admission: RE | Admit: 2017-09-28 | Discharge: 2017-09-28 | Disposition: A | Discharge status: Home / Self Care | Payer: Self-pay | Source: Ambulatory Visit | Attending: Gastroenterology | Admitting: Gastroenterology

## 2017-09-28 DIAGNOSIS — A0472 Enterocolitis due to Clostridium difficile, not specified as recurrent: Secondary | ICD-10-CM | POA: Insufficient documentation

## 2017-09-28 LAB — C DIFFICILE QUICK SCREEN W PCR REFLEX
C Diff antigen: POSITIVE — AB
C Diff toxin: NEGATIVE

## 2017-09-28 LAB — CLOSTRIDIUM DIFFICILE BY PCR, REFLEXED: Toxigenic C. Difficile by PCR: POSITIVE — AB

## 2017-09-29 ENCOUNTER — Encounter: Payer: Self-pay | Admitting: *Deleted

## 2017-09-29 ENCOUNTER — Other Ambulatory Visit: Payer: Self-pay | Admitting: Gastroenterology

## 2017-09-29 DIAGNOSIS — A0471 Enterocolitis due to Clostridium difficile, recurrent: Secondary | ICD-10-CM

## 2017-09-29 MED ORDER — FIDAXOMICIN 200 MG PO TABS
200.0000 mg | ORAL_TABLET | Freq: Two times a day (BID) | ORAL | 0 refills | Status: DC
Start: 2017-09-29 — End: 2017-10-07

## 2017-09-30 ENCOUNTER — Encounter: Admission: RE | Payer: Self-pay | Source: Ambulatory Visit

## 2017-09-30 ENCOUNTER — Ambulatory Visit: Admission: RE | Admit: 2017-09-30 | Payer: Medicaid Other | Source: Ambulatory Visit | Admitting: Gastroenterology

## 2017-09-30 SURGERY — COLONOSCOPY WITH PROPOFOL
Anesthesia: General

## 2017-10-04 ENCOUNTER — Other Ambulatory Visit: Payer: Self-pay

## 2017-10-04 DIAGNOSIS — Z79899 Other long term (current) drug therapy: Secondary | ICD-10-CM

## 2017-10-04 DIAGNOSIS — Z881 Allergy status to other antibiotic agents status: Secondary | ICD-10-CM

## 2017-10-04 DIAGNOSIS — Z886 Allergy status to analgesic agent status: Secondary | ICD-10-CM

## 2017-10-04 DIAGNOSIS — F17210 Nicotine dependence, cigarettes, uncomplicated: Secondary | ICD-10-CM | POA: Diagnosis present

## 2017-10-04 DIAGNOSIS — F329 Major depressive disorder, single episode, unspecified: Secondary | ICD-10-CM | POA: Diagnosis present

## 2017-10-04 DIAGNOSIS — N760 Acute vaginitis: Secondary | ICD-10-CM | POA: Diagnosis present

## 2017-10-04 DIAGNOSIS — D259 Leiomyoma of uterus, unspecified: Secondary | ICD-10-CM | POA: Diagnosis present

## 2017-10-04 DIAGNOSIS — N739 Female pelvic inflammatory disease, unspecified: Secondary | ICD-10-CM | POA: Diagnosis present

## 2017-10-04 DIAGNOSIS — A0471 Enterocolitis due to Clostridium difficile, recurrent: Principal | ICD-10-CM | POA: Diagnosis present

## 2017-10-04 MED ORDER — VANCOMYCIN HCL 125 MG PO CAPS: 125.0000 mg | ORAL_CAPSULE | Freq: Four times a day (QID) | ORAL | 0 refills | Status: DC

## 2017-10-05 ENCOUNTER — Encounter: Payer: Self-pay | Admitting: *Deleted

## 2017-10-05 ENCOUNTER — Inpatient Hospital Stay
Admission: EM | Admit: 2017-10-05 | Discharge: 2017-10-07 | DRG: 373 | Disposition: A | Discharge status: Home / Self Care | Payer: Self-pay | Attending: Specialist | Admitting: Specialist

## 2017-10-05 ENCOUNTER — Other Ambulatory Visit: Payer: Self-pay

## 2017-10-05 ENCOUNTER — Emergency Department: Payer: Self-pay

## 2017-10-05 DIAGNOSIS — A0471 Enterocolitis due to Clostridium difficile, recurrent: Secondary | ICD-10-CM

## 2017-10-05 DIAGNOSIS — R1030 Lower abdominal pain, unspecified: Secondary | ICD-10-CM

## 2017-10-05 DIAGNOSIS — A0472 Enterocolitis due to Clostridium difficile, not specified as recurrent: Secondary | ICD-10-CM

## 2017-10-05 LAB — CBC
HEMATOCRIT: 41.3 % (ref 35.0–47.0)
Hemoglobin: 14.4 g/dL (ref 12.0–16.0)
MCH: 31.2 pg (ref 26.0–34.0)
MCHC: 34.8 g/dL (ref 32.0–36.0)
MCV: 89.6 fL (ref 80.0–100.0)
Platelets: 248 10*3/uL (ref 150–440)
RBC: 4.6 MIL/uL (ref 3.80–5.20)
RDW: 14.1 % (ref 11.5–14.5)
WBC: 18.1 10*3/uL — AB (ref 3.6–11.0)

## 2017-10-05 LAB — POCT PREGNANCY, URINE: Preg Test, Ur: NEGATIVE

## 2017-10-05 LAB — COMPREHENSIVE METABOLIC PANEL
ALT: 14 U/L (ref 0–44)
ANION GAP: 6 (ref 5–15)
AST: 18 U/L (ref 15–41)
Albumin: 4.3 g/dL (ref 3.5–5.0)
Alkaline Phosphatase: 50 U/L (ref 38–126)
BILIRUBIN TOTAL: 0.8 mg/dL (ref 0.3–1.2)
BUN: 8 mg/dL (ref 6–20)
CHLORIDE: 104 mmol/L (ref 98–111)
CO2: 28 mmol/L (ref 22–32)
Calcium: 9.3 mg/dL (ref 8.9–10.3)
Creatinine, Ser: 0.78 mg/dL (ref 0.44–1.00)
Glucose, Bld: 113 mg/dL — ABNORMAL HIGH (ref 70–99)
POTASSIUM: 4.1 mmol/L (ref 3.5–5.1)
Sodium: 138 mmol/L (ref 135–145)
TOTAL PROTEIN: 7.4 g/dL (ref 6.5–8.1)

## 2017-10-05 LAB — URINALYSIS, COMPLETE (UACMP) WITH MICROSCOPIC
BACTERIA UA: NONE SEEN
BILIRUBIN URINE: NEGATIVE
Glucose, UA: NEGATIVE mg/dL
HGB URINE DIPSTICK: NEGATIVE
KETONES UR: NEGATIVE mg/dL
LEUKOCYTES UA: NEGATIVE
NITRITE: NEGATIVE
PH: 6 (ref 5.0–8.0)
Protein, ur: NEGATIVE mg/dL
SPECIFIC GRAVITY, URINE: 1.02 (ref 1.005–1.030)

## 2017-10-05 LAB — C-REACTIVE PROTEIN: CRP: 1.1 mg/dL — ABNORMAL HIGH (ref <1.0)

## 2017-10-05 LAB — LIPASE, BLOOD: Lipase: 25 U/L (ref 11–51)

## 2017-10-05 LAB — HCG, QUANTITATIVE, PREGNANCY

## 2017-10-05 LAB — SEDIMENTATION RATE: Sed Rate: 13 mm/hr (ref 0–20)

## 2017-10-05 LAB — TSH: TSH: 2.322 u[IU]/mL (ref 0.350–4.500)

## 2017-10-05 MED ORDER — DOXYCYCLINE HYCLATE 100 MG PO TABS
100.0000 mg | ORAL_TABLET | Freq: Two times a day (BID) | ORAL | Status: DC
  Administered 2017-10-06: 09:00:00 100 mg via ORAL
  Filled 2017-10-05 (×3): qty 1

## 2017-10-05 MED ORDER — ACETAMINOPHEN 325 MG PO TABS: 650.0000 mg | ORAL_TABLET | Freq: Four times a day (QID) | ORAL | Status: DC | PRN

## 2017-10-05 MED ORDER — NICOTINE 21 MG/24HR TD PT24
21.0000 mg | MEDICATED_PATCH | Freq: Every day | TRANSDERMAL | Status: DC
  Administered 2017-10-05 – 2017-10-07 (×3): 21 mg via TRANSDERMAL
  Filled 2017-10-05 (×3): qty 1

## 2017-10-05 MED ORDER — IOHEXOL 300 MG/ML  SOLN
100.0000 mL | Freq: Once | INTRAMUSCULAR | Status: AC | PRN
  Administered 2017-10-05: 100 mL via INTRAVENOUS

## 2017-10-05 MED ORDER — ONDANSETRON HCL 4 MG PO TABS
4.0000 mg | ORAL_TABLET | Freq: Four times a day (QID) | ORAL | Status: DC | PRN
  Filled 2017-10-05: qty 1

## 2017-10-05 MED ORDER — HYDROMORPHONE HCL 1 MG/ML IJ SOLN
1.0000 mg | Freq: Once | INTRAMUSCULAR | Status: AC
  Administered 2017-10-05: 1 mg via INTRAVENOUS

## 2017-10-05 MED ORDER — PROMETHAZINE HCL 25 MG/ML IJ SOLN
25.0000 mg | Freq: Once | INTRAMUSCULAR | Status: AC
  Administered 2017-10-05: 25 mg via INTRAVENOUS
  Filled 2017-10-05: qty 1

## 2017-10-05 MED ORDER — METOCLOPRAMIDE HCL 5 MG/ML IJ SOLN
INTRAMUSCULAR | Status: AC
  Filled 2017-10-05: qty 2

## 2017-10-05 MED ORDER — ENOXAPARIN SODIUM 40 MG/0.4ML ~~LOC~~ SOLN
40.0000 mg | SUBCUTANEOUS | Status: DC
  Administered 2017-10-07: 40 mg via SUBCUTANEOUS
  Filled 2017-10-05 (×2): qty 0.4

## 2017-10-05 MED ORDER — PROMETHAZINE HCL 25 MG/ML IJ SOLN
12.5000 mg | Freq: Once | INTRAMUSCULAR | Status: AC
Start: 2017-10-05 — End: 2017-10-05
  Administered 2017-10-05: 12.5 mg via INTRAVENOUS
  Filled 2017-10-05: qty 1

## 2017-10-05 MED ORDER — ONDANSETRON HCL 4 MG/2ML IJ SOLN
INTRAMUSCULAR | Status: AC
  Filled 2017-10-05: qty 2

## 2017-10-05 MED ORDER — ACETAMINOPHEN 650 MG RE SUPP: 650.0000 mg | Freq: Four times a day (QID) | RECTAL | Status: DC | PRN

## 2017-10-05 MED ORDER — PROMETHAZINE HCL 25 MG/ML IJ SOLN: 12.5000 mg | Freq: Four times a day (QID) | INTRAMUSCULAR | Status: DC | PRN

## 2017-10-05 MED ORDER — SODIUM CHLORIDE 0.9 % IV SOLN
1.0000 g | Freq: Once | INTRAVENOUS | Status: AC
  Administered 2017-10-05: 1 g via INTRAVENOUS
  Filled 2017-10-05: qty 1

## 2017-10-05 MED ORDER — HYDROMORPHONE HCL 1 MG/ML IJ SOLN
INTRAMUSCULAR | Status: AC
  Administered 2017-10-05: 1 mg via INTRAVENOUS
  Filled 2017-10-05: qty 1

## 2017-10-05 MED ORDER — SODIUM CHLORIDE 0.9 % IV SOLN
INTRAVENOUS | Status: DC
  Administered 2017-10-05 – 2017-10-07 (×4): via INTRAVENOUS

## 2017-10-05 MED ORDER — FIDAXOMICIN 200 MG PO TABS
200.0000 mg | ORAL_TABLET | Freq: Two times a day (BID) | ORAL | Status: DC
  Administered 2017-10-05 – 2017-10-07 (×5): 200 mg via ORAL
  Filled 2017-10-05 (×6): qty 1

## 2017-10-05 MED ORDER — SODIUM CHLORIDE 0.9 % IV SOLN: 500.0000 mg | INTRAVENOUS | Status: DC

## 2017-10-05 MED ORDER — SODIUM CHLORIDE 0.9 % IV SOLN: INTRAVENOUS | Status: DC

## 2017-10-05 MED ORDER — MORPHINE SULFATE (PF) 4 MG/ML IV SOLN
4.0000 mg | Freq: Once | INTRAVENOUS | Status: AC
  Administered 2017-10-05: 4 mg via INTRAVENOUS

## 2017-10-05 MED ORDER — MORPHINE SULFATE (PF) 2 MG/ML IV SOLN
2.0000 mg | INTRAVENOUS | Status: DC | PRN
  Administered 2017-10-05: 4 mg via INTRAVENOUS
  Administered 2017-10-05 (×2): 2 mg via INTRAVENOUS
  Filled 2017-10-05 (×2): qty 2
  Filled 2017-10-05: qty 1

## 2017-10-05 MED ORDER — ONDANSETRON HCL 4 MG/2ML IJ SOLN
4.0000 mg | Freq: Once | INTRAMUSCULAR | Status: AC
  Administered 2017-10-05: 4 mg via INTRAVENOUS

## 2017-10-05 MED ORDER — AZITHROMYCIN 500 MG IV SOLR
500.0000 mg | Freq: Once | INTRAVENOUS | Status: AC
  Administered 2017-10-05: 500 mg via INTRAVENOUS
  Filled 2017-10-05: qty 500

## 2017-10-05 MED ORDER — DOCUSATE SODIUM 100 MG PO CAPS
100.0000 mg | ORAL_CAPSULE | Freq: Two times a day (BID) | ORAL | Status: DC
  Administered 2017-10-05: 100 mg via ORAL
  Filled 2017-10-05: qty 1

## 2017-10-05 MED ORDER — PROMETHAZINE HCL 25 MG/ML IJ SOLN
12.5000 mg | Freq: Four times a day (QID) | INTRAMUSCULAR | Status: DC | PRN
  Administered 2017-10-05 – 2017-10-07 (×7): 12.5 mg via INTRAVENOUS
  Filled 2017-10-05 (×7): qty 1

## 2017-10-05 MED ORDER — HYDROMORPHONE HCL 1 MG/ML IJ SOLN
INTRAMUSCULAR | Status: AC
  Filled 2017-10-05: qty 1

## 2017-10-05 MED ORDER — ONDANSETRON HCL 4 MG/2ML IJ SOLN
4.0000 mg | Freq: Four times a day (QID) | INTRAMUSCULAR | Status: DC | PRN
  Administered 2017-10-05 – 2017-10-07 (×7): 4 mg via INTRAVENOUS
  Filled 2017-10-05 (×7): qty 2

## 2017-10-05 MED ORDER — VANCOMYCIN 50 MG/ML ORAL SOLUTION
125.0000 mg | Freq: Four times a day (QID) | ORAL | Status: DC
  Administered 2017-10-05: 125 mg via ORAL
  Filled 2017-10-05 (×5): qty 2.5

## 2017-10-05 MED ORDER — HYDROMORPHONE HCL 1 MG/ML IJ SOLN
0.2500 mg | INTRAMUSCULAR | Status: DC | PRN
  Administered 2017-10-05 – 2017-10-06 (×6): 0.25 mg via INTRAVENOUS
  Filled 2017-10-05 (×6): qty 1

## 2017-10-05 MED ORDER — BOOST / RESOURCE BREEZE PO LIQD CUSTOM
1.0000 | Freq: Three times a day (TID) | ORAL | Status: DC
  Administered 2017-10-05 – 2017-10-07 (×6): 1 via ORAL

## 2017-10-05 MED ORDER — OXYCODONE-ACETAMINOPHEN 5-325 MG PO TABS
1.0000 | ORAL_TABLET | Freq: Once | ORAL | Status: DC
Start: 2017-10-05 — End: 2017-10-05

## 2017-10-05 MED ORDER — METOCLOPRAMIDE HCL 5 MG/ML IJ SOLN
10.0000 mg | Freq: Once | INTRAMUSCULAR | Status: AC
  Administered 2017-10-05: 10 mg via INTRAVENOUS

## 2017-10-05 MED ORDER — METRONIDAZOLE IN NACL 5-0.79 MG/ML-% IV SOLN
500.0000 mg | Freq: Once | INTRAVENOUS | Status: AC
  Administered 2017-10-05: 500 mg via INTRAVENOUS
  Filled 2017-10-05: qty 100

## 2017-10-05 MED ORDER — SODIUM CHLORIDE 0.9 % IV BOLUS
1000.0000 mL | Freq: Once | INTRAVENOUS | Status: AC
  Administered 2017-10-05: 1000 mL via INTRAVENOUS

## 2017-10-05 MED ORDER — OXYCODONE-ACETAMINOPHEN 7.5-325 MG PO TABS
1.0000 | ORAL_TABLET | Freq: Four times a day (QID) | ORAL | Status: DC | PRN
  Administered 2017-10-05 – 2017-10-07 (×7): 1 via ORAL
  Filled 2017-10-05 (×7): qty 1

## 2017-10-05 NOTE — Progress Notes (Signed)
Pt complaining of severe pain of abdomen. Current order morphine q 4 currently unavailable last admin in 7 oclock hr. MD contacted awaiting orders

## 2017-10-05 NOTE — Progress Notes (Signed)
Initial Nutrition Assessment  DOCUMENTATION CODES:   Not applicable  INTERVENTION:  Provide Boost Breeze po TID, each supplement provides 250 kcal and 9 grams of protein.  NUTRITION DIAGNOSIS:   Inadequate oral intake related to decreased appetite, nausea, vomiting as evidenced by per patient/family report.  GOAL:   Patient will meet greater than or equal to 90% of their needs  MONITOR:   PO intake, Supplement acceptance, Diet advancement, Labs, Weight trends, I & O's  REASON FOR ASSESSMENT:   Malnutrition Screening Tool    ASSESSMENT:   27 year old female with PMHx of depression, pelvic inflammatory disease, bacterial vaginosis now admitted with recurrent C. difficile infection, intractable N/V.   Met with patient at bedside. She reports she has had a decreased appetite and intake for the past 1-2 days. She reports it is related to her N/V. She endorses diarrhea with C. diff but reports the diarrhea was not impacting her appetite/intake. Up until 1-2 days ago she continued to eat her usual intake of 2 meals per day, which usually consists of chicken or another meat with vegetables. She is currently having significant nausea but did tolerate some of her clear liquid tray today. Patient is amenable to drinking clear ONS to help increase protein intake.  Patient reports her UBW is 155 lbs (70.5 kg). Currently 71.7 kg.  Medications reviewed and include: doxycycline, ceftriaxone, NS @ 75 mL/hr.  Labs reviewed.  Patient does not meet criteria for malnutrition at this time. She is certainly at risk for acute malnutrition if inadequate intake persists, so will continue to follow.  Discussed with RN.  NUTRITION - FOCUSED PHYSICAL EXAM:    Most Recent Value  Orbital Region  No depletion  Upper Arm Region  No depletion  Thoracic and Lumbar Region  No depletion  Buccal Region  No depletion  Temple Region  No depletion  Clavicle Bone Region  No depletion  Clavicle and Acromion  Bone Region  No depletion  Scapular Bone Region  No depletion  Dorsal Hand  No depletion  Patellar Region  No depletion  Anterior Thigh Region  No depletion  Posterior Calf Region  No depletion  Edema (RD Assessment)  None  Hair  Reviewed  Eyes  Reviewed  Mouth  Reviewed  Skin  Reviewed  Nails  Reviewed     Diet Order:   Diet Order            Diet clear liquid Room service appropriate? Yes; Fluid consistency: Thin  Diet effective now              EDUCATION NEEDS:   No education needs have been identified at this time  Skin:  Skin Assessment: Reviewed RN Assessment  Last BM:  10/05/2017  Height:   Ht Readings from Last 1 Encounters:  10/05/17 '5\' 3"'  (1.6 m)    Weight:   Wt Readings from Last 1 Encounters:  10/05/17 71.7 kg    Ideal Body Weight:  52.3 kg  BMI:  Body mass index is 27.99 kg/m.  Estimated Nutritional Needs:   Kcal:  1705-1990 (MSJ x 1.2-1.4)  Protein:  85-100 grams (1.2-1.4 grams/kg)  Fluid:  1.7-2 L/day (1 mL/kcal)  Willey Blade, MS, RD, LDN Office: [phone] Pager: [phone] After Hours/Weekend Pager: [phone]

## 2017-10-05 NOTE — ED Notes (Signed)
Pt in with co mid upper abd pain and lower abd pain states has been dx with c diff and PID. States has had cdiff since June where she was admitted for the same. States started having mid lower abd pain 2 weeks ago and dx with PID by gyn. States was given antibiotics for for both but states was too expensive and was not able to get it filled. States has had diarrhea for weeks, started vomiting today. Denies any dysuria or fever at this time, no discharge.

## 2017-10-05 NOTE — ED Notes (Signed)
Report given to Henry RN 

## 2017-10-05 NOTE — ED Triage Notes (Addendum)
Pt has cdiff and pid.  Pt unable to afford rx and was sent to er for eval and medication.  Pt treated at health dept for pid, pt did not get rx filled.  Pt alert.

## 2017-10-05 NOTE — Progress Notes (Signed)
Pt requesting different pain medication. On coming RN aware. Off going RN, and On comming RN dicussed pain management and planning if care with Pt. Ongoing RN to continue monitoring and follow up accordingly

## 2017-10-05 NOTE — Progress Notes (Addendum)
[facility] [place] NAME: Kathleen Owen    MR#:  [id number]  DATE OF BIRTH:  09-Jan-1991  SUBJECTIVE:  CHIEF COMPLAINT:   Chief Complaint  Patient presents with  . Abdominal Pain  Seen and evaluated this morning Has abdominal discomfort Has nausea No complaints of any diarrhea  REVIEW OF SYSTEMS:    ROS  CONSTITUTIONAL: No documented fever. Has fatigue, weakness. No weight gain, no weight loss.  EYES: No blurry or double vision.  ENT: No tinnitus. No postnasal drip. No redness of the oropharynx.  RESPIRATORY: No cough, no wheeze, no hemoptysis. No dyspnea.  CARDIOVASCULAR: No chest pain. No orthopnea. No palpitations. No syncope.  GASTROINTESTINAL: Has nausea, Has vomiting or diarrhea. Has abdominal pain. No melena or hematochezia.  GENITOURINARY: No dysuria or hematuria.  ENDOCRINE: No polyuria or nocturia. No heat or cold intolerance.  HEMATOLOGY: No anemia. No bruising. No bleeding.  INTEGUMENTARY: No rashes. No lesions.  MUSCULOSKELETAL: No arthritis. No swelling. No gout.  NEUROLOGIC: No numbness, tingling, or ataxia. No seizure-type activity.  PSYCHIATRIC: No anxiety. No insomnia. No ADD.   DRUG ALLERGIES:   Allergies  Allergen Reactions  . Aspirin Other (See Comments)    Chron's Disease  . Ibuprofen Other (See Comments)    Chron's Disease  . Amoxicillin Rash and Other (See Comments)    Has patient had a PCN reaction causing immediate rash, facial/tongue/throat swelling, SOB or lightheadedness with hypotension: Unknown Has patient had a PCN reaction causing severe rash involving mucus membranes or skin necrosis: Unknown Has patient had a PCN reaction that required hospitalization: Unknown Has patient had a PCN reaction occurring within the last 10 years: No If all of the above answers are "NO", then may proceed with Cephalosporin use.     VITALS:  Blood pressure 94/68, pulse 80, temperature 98.2 F (36.8 C),  temperature source Oral, resp. rate 13, height 5' 3"  (1.6 m), weight 71.7 kg, last menstrual period 09/16/2017, SpO2 100 %.  PHYSICAL EXAMINATION:   Physical Exam  GENERAL:  27 y.o.-year-old patient lying in the bed with no acute distress.  EYES: Pupils equal, round, reactive to light and accommodation. No scleral icterus. Extraocular muscles intact.  HEENT: Head atraumatic, normocephalic. Oropharynx and nasopharynx clear.  NECK:  Supple, no jugular venous distention. No thyroid enlargement, no tenderness.  LUNGS: Normal breath sounds bilaterally, no wheezing, rales, rhonchi. No use of accessory muscles of respiration.  CARDIOVASCULAR: S1, S2 normal. No murmurs, rubs, or gallops.  ABDOMEN: Soft, Mild tenderness around umbilicus, nondistended. Bowel sounds present. No organomegaly or mass.  EXTREMITIES: No cyanosis, clubbing or edema b/l.    NEUROLOGIC: Cranial nerves II through XII are intact. No focal Motor or sensory deficits b/l.   PSYCHIATRIC: The patient is alert and oriented x 3.  SKIN: No obvious rash, lesion, or ulcer.   LABORATORY PANEL:   CBC Recent Labs  Lab 10/05/17 0009  WBC 18.1*  HGB 14.4  HCT 41.3  PLT 248   ------------------------------------------------------------------------------------------------------------------ Chemistries  Recent Labs  Lab 10/05/17 0009  NA 138  K 4.1  CL 104  CO2 28  GLUCOSE 113*  BUN 8  CREATININE 0.78  CALCIUM 9.3  AST 18  ALT 14  ALKPHOS 50  BILITOT 0.8   ------------------------------------------------------------------------------------------------------------------  Cardiac Enzymes No results for input(s): TROPONINI in the last 168 hours. ------------------------------------------------------------------------------------------------------------------  RADIOLOGY:  Ct Abdomen Pelvis W Contrast  Result Date: 10/05/2017 CLINICAL DATA:  Mid upper abdominal pain and lower  abdominal pain. Previous diagnosis of C  difficile and pelvic inflammatory disease. EXAM: CT ABDOMEN AND PELVIS WITH CONTRAST TECHNIQUE: Multidetector CT imaging of the abdomen and pelvis was performed using the standard protocol following bolus administration of intravenous contrast. CONTRAST:  115m OMNIPAQUE IOHEXOL 300 MG/ML  SOLN COMPARISON:  07/23/2017 FINDINGS: Lower chest: Lung bases are clear. Hepatobiliary: No focal liver abnormality is seen. No gallstones, gallbladder wall thickening, or biliary dilatation. Pancreas: Unremarkable. No pancreatic ductal dilatation or surrounding inflammatory changes. Spleen: Normal in size without focal abnormality. Adrenals/Urinary Tract: Adrenal glands are unremarkable. Kidneys are normal, without renal calculi, focal lesion, or hydronephrosis. Bladder is unremarkable. Stomach/Bowel: Stomach, small bowel, and colon are not abnormally distended. Stool fills the colon. No wall thickening identified although under distention limits evaluation of bowel wall. Appendix is normal. Vascular/Lymphatic: No significant vascular findings are present. No enlarged abdominal or pelvic lymph nodes. Reproductive: Large fibroid in the dome of the uterus. Ovaries are symmetrical without significant enlargement. Small amount of free fluid in the pelvis is likely physiologic. Other: No free air in the abdomen. Abdominal wall musculature appears intact. Musculoskeletal: No acute or significant osseous findings. IMPRESSION: No acute process demonstrated in the abdomen or pelvis. No evidence of bowel obstruction or inflammation. Uterine fibroid. Electronically Signed   By: WLucienne CapersM.D.   On: 10/05/2017 02:46     ASSESSMENT AND PLAN:  27year old female patient with history of C. difficile colitis, Crohn's disease currently under hospitalist service for abdominal discomfort and recurrent C. difficile colitis.  -Recurrent C. difficile colitis Gastroenterology consultation Oral vancomycin to continue Monitor  electrolytes  -Nausea and vomiting Antiemetic medication  -Pelvic inflammatory disease [facility] 1 dose of IV ceftriaxone Start oral doxycycline GYN consult  -Tobacco abuse Tobacco cessation counseled to the patient for 6 minutes Nicotine patch offered  -DVT prophylaxis subcu Lovenox daily  -Abdominal pain Continue IV morphine and oral Percocet as needed   All the records are reviewed and case discussed with Care Management/Social Worker. Management plans discussed with the patient, family and they are in agreement.  CODE STATUS: Full code  DVT Prophylaxis: SCDs  TOTAL TIME TAKING CARE OF THIS PATIENT: 35 minutes.   POSSIBLE D/C IN 2 to 3 DAYS, DEPENDING ON CLINICAL CONDITION.  PSaundra ShellingM.D on 10/05/2017 at 10:17 AM  Between 7am to 6pm - Pager - [phone]  After 6pm go to [email or web address] - password [email or web address]  Office  [phone] CC: Primary care physician; CPauline GoodJThornell Mule MD  Note: This dictation was prepared with Dragon dictation along with smaller phrase technology. Any transcriptional errors that result from this process are unintentional.

## 2017-10-05 NOTE — Consult Note (Signed)
Cephas Darby, MD [address]  [place]  [place], [place] [address]  Main: [phone]  Fax: [phone] Pager: [phone]   Consultation  Referring Provider:     No ref. provider found Primary Care Physician:  Pauline Good Thornell Mule, MD Primary Gastroenterologist:  Dr. Sherri Sear         Reason for Consultation:     Recurrent C. difficile  Date of Admission:  10/05/2017 Date of Consultation:  10/05/2017         HPI:   Kathleen Owen is a 27 y.o. female with no significant past medical history, well-known to me was treated recently for initial episode of C. difficile infection with 2 weeks course of vancomycin. I saw her in the office about 2 weeks ago with recurrence of diarrhea and abdominal pain after completing antibiotics. Repeat stool studies came back positive for C. difficile infection. Patient does not have insurance and she could not afford Dificid or vancomycin. She received her first vancomycin course through the hospital when she was admitted for the initial episode. In addition to diarrhea and abdominal pain, she has been experiencing dyspareunia, and cervical tenderness on exam by her healthcare provider and was told that she has pelvic inflammatory disease along with bacterial vaginosis. In addition to both symptoms, started experiencing nausea and vomiting since yesterday which brought her to the emergency room. She received IV azithromycin, IV metronidazole and was started on oral vancomycin. She is admitted due to poor by mouth intake. GI is consulted for further management of recurrent C. difficile infection. She underwent CT A/P with contrast on admission and it was unremarkable. She did have leukocytosis with WBC count 18   her diarrhea has slightly improved  NSAIDs: None   Antiplts/Anticoagulants/Anti thrombotics: None   GI Procedures:colonoscopy in 12/2013 normal DIAGNOSIS:  A. TERMINAL ILEUM; BIOPSY:  -SMALL BOWEL MUCOSA WITH INTACT VILLOUS  ARCHITECTURE.  -NEGATIVE FOR DYSPLASIA AND MALIGNANCY.   B. COLON, RANDOM; COLD BIOPSY:  -COLONIC MUCOSA NEGATIVE FOR MICROSCOPIC COLITIS, DYSPLASIA AND  MALIGNANCY. Upper endoscopy and small bowel follow-through negative  Past Medical History:  Diagnosis Date  . 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 CD (Crohn's disease) ([place]) 07/30/2014  . Crohn's disease [facility]) Jan 2016  . Depression   . Miscarriage 07/18/2014    Past Surgical History:  Procedure Laterality Date  . DILATION AND CURETTAGE OF UTERUS    . DILATION AND EVACUATION N/A 12/02/2016   Procedure: DILATATION AND EVACUATION;  Surgeon: Harlin Heys, MD;  Location: [facility] ORS;  Service: Gynecology;  Laterality: N/A;  . DILATION AND EVACUATION N/A 06/30/2017   Procedure: DILATATION AND EVACUATION;  Surgeon: Harlin Heys, MD;  Location: [facility] ORS;  Service: Gynecology;  Laterality: N/A;  . TONSILLECTOMY       Current Facility-Administered Medications:  .  0.9 %  sodium chloride infusion, , Intravenous, Continuous, Pyreddy, Pavan, MD .  0.9 %  sodium chloride infusion, , Intravenous, Continuous, Pyreddy, Pavan, MD .  acetaminophen (TYLENOL) tablet 650 mg, 650 mg, Oral, Q6H PRN **OR** acetaminophen (TYLENOL) suppository 650 mg, 650 mg, Rectal, Q6H PRN, Harrie Foreman, MD .  cefTRIAXone (ROCEPHIN) 1 g in sodium chloride 0.9 % 100 mL IVPB, 1 g, Intravenous, Once, Pyreddy, Pavan, MD .  doxycycline (VIBRA-TABS) tablet 100 mg, 100 mg, Oral, Q12H, Pyreddy, Pavan, MD .  enoxaparin (LOVENOX) injection 40 mg, 40 mg, Subcutaneous, Q24H, Harrie Foreman, MD .  fidaxomicin (DIFICID) tablet 200 mg, 200 mg, Oral, BID, Vanga, Tally Due, MD .  morphine 2 MG/ML injection 2-4 mg, 2-4 mg, Intravenous, Q4H PRN, Harrie Foreman, MD, 2 mg at 10/05/17 1428 .  nicotine (NICODERM CQ - dosed in mg/24 hours) patch 21 mg, 21 mg, Transdermal, Daily, Pyreddy, Pavan, MD, 21 mg at 10/05/17  1023 .  ondansetron (ZOFRAN) tablet 4 mg, 4 mg, Oral, Q6H PRN **OR** ondansetron (ZOFRAN) injection 4 mg, 4 mg, Intravenous, Q6H PRN, Harrie Foreman, MD, 4 mg at 10/05/17 1038 .  oxyCODONE-acetaminophen (PERCOCET) 7.5-325 MG per tablet 1 tablet, 1 tablet, Oral, Q6H PRN, Pyreddy, Pavan, MD, 1 tablet at 10/05/17 1031 .  promethazine (PHENERGAN) injection 12.5 mg, 12.5 mg, Intravenous, Q6H PRN, Vanga, Tally Due, MD   Family History  Problem Relation Age of Onset  . Kidney disease Maternal Uncle   . Heart failure Maternal Uncle   . Heart failure Maternal Grandmother   . Hypertension Paternal Grandmother   . Thyroid disease Paternal Aunt      Social History   Tobacco Use  . Smoking status: Current Every Day Smoker    Packs/day: 2.00    Types: Cigars  . Smokeless tobacco: Never Used  Substance Use Topics  . Alcohol use: Yes    Comment: occasionally  . Drug use: No    Allergies as of 10/04/2017 - Review Complete 09/29/2017  Allergen Reaction Noted  . Aspirin  02/28/2015  . Ibuprofen  02/28/2015  . Amoxicillin Rash 07/15/2014    Review of Systems:    All systems reviewed and negative except where noted in HPI.   Physical Exam:  Vital signs in last 24 hours: Temp:  [98 F (36.7 C)-98.4 F (36.9 C)] 98.4 F (36.9 C) (08/13 1415) Pulse Rate:  [74-88] 76 (08/13 1415) Resp:  [13-20] 20 (08/13 1415) BP: (94-125)/(59-77) 106/69 (08/13 1415) SpO2:  [96 %-100 %] 100 % (08/13 1415) Weight:  [71.7 kg] 71.7 kg (08/13 0003) Last BM Date: 10/05/17 General:   Pleasant, cooperative in NAD Head:  Normocephalic and atraumatic. Eyes:   No icterus.   Conjunctiva pink. PERRLA. Ears:  Normal auditory acuity. Neck:  Supple; no masses or thyroidomegaly Lungs: Respirations even and unlabored. Lungs clear to auscultation bilaterally.   No wheezes, crackles, or rhonchi.  Heart:  Regular rate and rhythm;  Without murmur, clicks, rubs or gallops Abdomen:  Soft, nondistended, diffuse lower  abdominal  tenderness. Normal bowel sounds. No appreciable masses or hepatomegaly.  No rebound or guarding.  Rectal:  Not performed. Msk:  Symmetrical without gross deformities.  Strength normal  Extremities:  Without edema, cyanosis or clubbing. Neurologic:  Alert and oriented x3;  grossly normal neurologically. Skin:  Intact without significant lesions or rashes. Cervical Nodes:  No significant cervical adenopathy. Psych:  Alert and cooperative. Normal affect.  LAB RESULTS: CBC Latest Ref Rng & Units 10/05/2017 07/24/2017 07/23/2017  WBC 3.6 - 11.0 K/uL 18.1(H) 10.5 24.8(H)  Hemoglobin 12.0 - 16.0 g/dL 14.4 12.1 14.5  Hematocrit 35.0 - 47.0 % 41.3 35.7 42.3  Platelets 150 - 440 K/uL 248 207 264    BMET BMP Latest Ref Rng & Units 10/05/2017 07/26/2017 07/25/2017  Glucose 70 - 99 mg/dL 113(H) 101(H) 92  BUN 6 - 20 mg/dL 8 6 <5(L)  Creatinine 0.44 - 1.00 mg/dL 0.78 0.57 0.50  Sodium 135 - 145 mmol/L 138 138 138  Potassium 3.5 - 5.1 mmol/L 4.1 3.5 3.7  Chloride 98 - 111 mmol/L 104 104 105  CO2 22 - 32 mmol/L 28 26 26   Calcium 8.9 - 10.3 mg/dL 9.3 8.8(L) 8.8(L)    LFT Hepatic Function Latest Ref Rng & Units 10/05/2017 07/23/2017 12/01/2016  Total Protein 6.5 - 8.1 g/dL 7.4 7.5 7.8  Albumin 3.5 - 5.0 g/dL 4.3 4.1 3.9  AST 15 - 41 U/L 18 24 26   ALT 0 - 44 U/L 14 14 15   Alk Phosphatase 38 - 126 U/L 50 52 46  Total Bilirubin 0.3 - 1.2 mg/dL 0.8 0.4 0.4     STUDIES: Ct Abdomen Pelvis W Contrast  Result Date: 10/05/2017 CLINICAL DATA:  Mid upper abdominal pain and lower abdominal pain. Previous diagnosis of C difficile and pelvic inflammatory disease. EXAM: CT ABDOMEN AND PELVIS WITH CONTRAST TECHNIQUE: Multidetector CT imaging of the abdomen and pelvis was performed using the standard protocol following bolus administration of intravenous contrast. CONTRAST:  13m OMNIPAQUE IOHEXOL 300 MG/ML  SOLN COMPARISON:  07/23/2017 FINDINGS: Lower chest: Lung bases are clear. Hepatobiliary: No focal  liver abnormality is seen. No gallstones, gallbladder wall thickening, or biliary dilatation. Pancreas: Unremarkable. No pancreatic ductal dilatation or surrounding inflammatory changes. Spleen: Normal in size without focal abnormality. Adrenals/Urinary Tract: Adrenal glands are unremarkable. Kidneys are normal, without renal calculi, focal lesion, or hydronephrosis. Bladder is unremarkable. Stomach/Bowel: Stomach, small bowel, and colon are not abnormally distended. Stool fills the colon. No wall thickening identified although under distention limits evaluation of bowel wall. Appendix is normal. Vascular/Lymphatic: No significant vascular findings are present. No enlarged abdominal or pelvic lymph nodes. Reproductive: Large fibroid in the dome of the uterus. Ovaries are symmetrical without significant enlargement. Small amount of free fluid in the pelvis is likely physiologic. Other: No free air in the abdomen. Abdominal wall musculature appears intact. Musculoskeletal: No acute or significant osseous findings. IMPRESSION: No acute process demonstrated in the abdomen or pelvis. No evidence of bowel obstruction or inflammation. Uterine fibroid. Electronically Signed   By: WLucienne CapersM.D.   On: 10/05/2017 02:46      Impression / Plan:   Kathleen FULLENWIDERis a 27y.o. African-American female with first recurrent C. difficile infection, pelvic inflammatory disease and bacterial vaginosis   C. difficile infection: First recurrence Recommend to switch from oral vancomycin to fidaxomicin 200 mg twice daily for 10 days total  PID and bacterial vaginosis: being treated with azithromycin, metronidazole and ceftriaxone  Intractable nausea and vomiting : Likely combination of recurrent C. difficile and pelvic inflammatory disease Zofran alternate with Phenergan  Treat as above  clear liquid diet    Thank you for involving me in the care of this patient.  Will follow along with you    LOS: 0 days    RSherri Sear MD  10/05/2017, 3:50 PM   Note: This dictation was prepared with Dragon dictation along with smaller phrase technology. Any transcriptional errors that result from this process are unintentional.

## 2017-10-05 NOTE — Progress Notes (Signed)
MD texted paged and made aware of neg. Pregnancy results. Pharmacist, Lattie Haw, aware of negative pregnancy results.  Doxycycline unheld. Requested dose be sent and medication administration times adjusted accordingly

## 2017-10-05 NOTE — H&P (Signed)
Kathleen Owen is an 27 y.o. female.   Chief Complaint: Abdominal pain HPI: The patient with past medical history of Crohn's disease presents to the emergency department complaining of abdominal pain.  The patient has had approximately 2 months of pain that has improved at times but never fully resolved.  Over the last week her abdominal pain has been worse.  She also has had diarrhea.  The patient had subjective fever a few days ago and today developed nonbloody nonbilious vomiting.  She was admitted to the hospital at the beginning of this.  At time with C. difficile colitis.  She was prescribed vancomycin but was unable to obtain the medication as an outpatient.  She was also seen at the health department recently and diagnosed with bacterial vaginosis as well as PID.  Laboratory evaluation revealed leukocytosis.  She was given Flagyl and azithromycin IV.  Her abdominal pain in the emergency department has subsided with multiple doses of IV analgesia but the patient continues to have nausea and vomiting which has prompted the emergency department staff to call the hospitalist service for admission.  Past Medical History:  Diagnosis Date  . 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 CD (Crohn's disease) ([place]) 07/30/2014  . Crohn's disease [facility]) Jan 2016  . Depression   . Miscarriage 07/18/2014    Past Surgical History:  Procedure Laterality Date  . DILATION AND CURETTAGE OF UTERUS    . DILATION AND EVACUATION N/A 12/02/2016   Procedure: DILATATION AND EVACUATION;  Surgeon: Harlin Heys, MD;  Location: [facility] ORS;  Service: Gynecology;  Laterality: N/A;  . DILATION AND EVACUATION N/A 06/30/2017   Procedure: DILATATION AND EVACUATION;  Surgeon: Harlin Heys, MD;  Location: [facility] ORS;  Service: Gynecology;  Laterality: N/A;  . TONSILLECTOMY      Family History  Problem Relation Age of Onset  . Kidney disease Maternal Uncle   . Heart failure  Maternal Uncle   . Heart failure Maternal Grandmother   . Hypertension Paternal Grandmother   . Thyroid disease Paternal Aunt    Social History:  reports that she has been smoking cigarettes. She has been smoking about 0.50 packs per day. She has never used smokeless tobacco. She reports that she drinks alcohol. She reports that she does not use drugs.  Allergies:  Allergies  Allergen Reactions  . Aspirin Other (See Comments)    Chron's Disease  . Ibuprofen Other (See Comments)    Chron's Disease  . Amoxicillin Rash and Other (See Comments)    Has patient had a PCN reaction causing immediate rash, facial/tongue/throat swelling, SOB or lightheadedness with hypotension: Unknown Has patient had a PCN reaction causing severe rash involving mucus membranes or skin necrosis: Unknown Has patient had a PCN reaction that required hospitalization: Unknown Has patient had a PCN reaction occurring within the last 10 years: No If all of the above answers are "NO", then may proceed with Cephalosporin use.     Medications Prior to Admission  Medication Sig Dispense Refill  . acidophilus (RISAQUAD) CAPS capsule Take 2 capsules by mouth daily. (Patient not taking: Reported on 09/27/2017) 60 capsule 0  . dicyclomine (BENTYL) 10 MG capsule Take 1 capsule (10 mg total) by mouth 4 (four) times daily -  before meals and at bedtime. (Patient not taking: Reported on 10/05/2017) 120 capsule 0  . fidaxomicin (DIFICID) 200 MG TABS tablet Take 1 tablet (200 mg total) by mouth 2 (two)  times daily for 14 days. (Patient not taking: Reported on 10/05/2017) 28 tablet 0  . ondansetron (ZOFRAN) 4 MG tablet Take 1 tablet (4 mg total) by mouth every 6 (six) hours as needed for nausea. (Patient not taking: Reported on 09/27/2017) 20 tablet 0  . sertraline (ZOLOFT) 50 MG tablet Take 1 tablet (50 mg total) by mouth daily. 30 tablet 1  . vancomycin (VANCOCIN) 125 MG capsule Take 1 capsule (125 mg total) by mouth 4 (four) times daily  for 28 days. For 4 weeks (Patient not taking: Reported on 10/05/2017) 112 capsule 0  . vancomycin (VANCOCIN) 50 mg/mL oral solution Take 2.5 mLs (125 mg total) by mouth 4 (four) times daily. For 11 days (Patient not taking: Reported on 09/27/2017) 110 mL 0    Results for orders placed or performed during the hospital encounter of 10/05/17 (from the past 48 hour(s))  Lipase, blood     Status: None   Collection Time: 10/05/17 12:09 AM  Result Value Ref Range   Lipase 25 11 - 51 U/L    Comment: Performed at [facility], [address] [place], [place] [address]  Comprehensive metabolic panel     Status: Abnormal   Collection Time: 10/05/17 12:09 AM  Result Value Ref Range   Sodium 138 135 - 145 mmol/L   Potassium 4.1 3.5 - 5.1 mmol/L   Chloride 104 98 - 111 mmol/L   CO2 28 22 - 32 mmol/L   Glucose, Bld 113 (H) 70 - 99 mg/dL   BUN 8 6 - 20 mg/dL   Creatinine, Ser 0.78 0.44 - 1.00 mg/dL   Calcium 9.3 8.9 - 10.3 mg/dL   Total Protein 7.4 6.5 - 8.1 g/dL   Albumin 4.3 3.5 - 5.0 g/dL   AST 18 15 - 41 U/L   ALT 14 0 - 44 U/L   Alkaline Phosphatase 50 38 - 126 U/L   Total Bilirubin 0.8 0.3 - 1.2 mg/dL   GFR calc non Af Amer >60 >60 mL/min   GFR calc Af Amer >60 >60 mL/min    Comment: (NOTE) The eGFR has been calculated using the CKD EPI equation. This calculation has not been validated in all clinical situations. eGFR's persistently <60 mL/min signify possible Chronic Kidney Disease.    Anion gap 6 5 - 15    Comment: Performed at [facility], [address] [place], [place] [address]  CBC     Status: Abnormal   Collection Time: 10/05/17 12:09 AM  Result Value Ref Range   WBC 18.1 (H) 3.6 - 11.0 K/uL   RBC 4.60 3.80 - 5.20 MIL/uL   Hemoglobin 14.4 12.0 - 16.0 g/dL   HCT 41.3 35.0 - 47.0 %   MCV 89.6 80.0 - 100.0 fL   MCH 31.2 26.0 - 34.0 pg   MCHC 34.8 32.0 - 36.0 g/dL   RDW 14.1 11.5 - 14.5 %   Platelets 248 150 - 440 K/uL    Comment: Performed at [facility], [address] [place],  [address]  Urinalysis, Complete w Microscopic     Status: Abnormal   Collection Time: 10/05/17 12:09 AM  Result Value Ref Range   Color, Urine YELLOW (A) YELLOW   APPearance HAZY (A) CLEAR   Specific Gravity, Urine 1.020 1.005 - 1.030   pH 6.0 5.0 - 8.0   Glucose, UA NEGATIVE NEGATIVE mg/dL   Hgb urine dipstick NEGATIVE NEGATIVE   Bilirubin Urine NEGATIVE NEGATIVE   Ketones,  ur NEGATIVE NEGATIVE mg/dL   Protein, ur NEGATIVE NEGATIVE mg/dL   Nitrite NEGATIVE NEGATIVE   Leukocytes, UA NEGATIVE NEGATIVE   RBC / HPF 0-5 0 - 5 RBC/hpf   WBC, UA 0-5 0 - 5 WBC/hpf   Bacteria, UA NONE SEEN NONE SEEN   Squamous Epithelial / LPF 0-5 0 - 5   Mucus PRESENT     Comment: Performed at [facility], [address] [place], [place] [address]  Pregnancy, urine POC     Status: None   Collection Time: 10/05/17 12:14 AM  Result Value Ref Range   Preg Test, Ur NEGATIVE NEGATIVE    Comment:        THE SENSITIVITY OF THIS METHODOLOGY IS >24 mIU/mL    Ct Abdomen Pelvis W Contrast  Result Date: 10/05/2017 CLINICAL DATA:  Mid upper abdominal pain and lower abdominal pain. Previous diagnosis of C difficile and pelvic inflammatory disease. EXAM: CT ABDOMEN AND PELVIS WITH CONTRAST TECHNIQUE: Multidetector CT imaging of the abdomen and pelvis was performed using the standard protocol following bolus administration of intravenous contrast. CONTRAST:  158m OMNIPAQUE IOHEXOL 300 MG/ML  SOLN COMPARISON:  07/23/2017 FINDINGS: Lower chest: Lung bases are clear. Hepatobiliary: No focal liver abnormality is seen. No gallstones, gallbladder wall thickening, or biliary dilatation. Pancreas: Unremarkable. No pancreatic ductal dilatation or surrounding inflammatory changes. Spleen: Normal in size without focal abnormality. Adrenals/Urinary Tract: Adrenal glands are unremarkable. Kidneys are normal, without renal calculi, focal lesion, or hydronephrosis. Bladder is  unremarkable. Stomach/Bowel: Stomach, small bowel, and colon are not abnormally distended. Stool fills the colon. No wall thickening identified although under distention limits evaluation of bowel wall. Appendix is normal. Vascular/Lymphatic: No significant vascular findings are present. No enlarged abdominal or pelvic lymph nodes. Reproductive: Large fibroid in the dome of the uterus. Ovaries are symmetrical without significant enlargement. Small amount of free fluid in the pelvis is likely physiologic. Other: No free air in the abdomen. Abdominal wall musculature appears intact. Musculoskeletal: No acute or significant osseous findings. IMPRESSION: No acute process demonstrated in the abdomen or pelvis. No evidence of bowel obstruction or inflammation. Uterine fibroid. Electronically Signed   By: WLucienne CapersM.D.   On: 10/05/2017 02:46    Review of Systems  Constitutional: Negative for chills and fever.  HENT: Negative for sore throat and tinnitus.   Eyes: Negative for blurred vision and redness.  Respiratory: Negative for cough and shortness of breath.   Cardiovascular: Negative for chest pain, palpitations, orthopnea and PND.  Gastrointestinal: Positive for abdominal pain, diarrhea, nausea and vomiting. Negative for blood in stool.  Genitourinary: Negative for dysuria, frequency and urgency.  Musculoskeletal: Negative for joint pain and myalgias.  Skin: Negative for rash.       No lesions  Neurological: Negative for speech change, focal weakness and weakness.  Endo/Heme/Allergies: Does not bruise/bleed easily.       No temperature intolerance  Psychiatric/Behavioral: Negative for depression and suicidal ideas.    Blood pressure 110/62, pulse 74, temperature 98 F (36.7 C), temperature source Oral, resp. rate 16, height _0  (1.6 m), weight 71.7 kg, last menstrual period 09/16/2017, SpO2 100 %. Physical Exam  Vitals reviewed. Constitutional: She is oriented to person, place, and  time. She appears well-developed and well-nourished. No distress.  HENT:  Head: Normocephalic and atraumatic.  Mouth/Throat: Oropharynx is clear and moist.  Eyes: Pupils are equal, round, and reactive to light. Conjunctivae and EOM are normal. No scleral icterus.  Neck: Normal range of  motion. Neck supple. No JVD present. No tracheal deviation present. No thyromegaly present.  Cardiovascular: Normal rate, regular rhythm and normal heart sounds. Exam reveals no gallop and no friction rub.  No murmur heard. Respiratory: Effort normal and breath sounds normal.  GI: Soft. Bowel sounds are normal. She exhibits no distension and no mass. There is tenderness. There is no rebound and no guarding.  Genitourinary:  Genitourinary Comments: Deferred  Musculoskeletal: Normal range of motion. She exhibits no edema.  Lymphadenopathy:    She has no cervical adenopathy.  Neurological: She is alert and oriented to person, place, and time. No cranial nerve deficit. She exhibits normal muscle tone.  Skin: Skin is warm and dry. No rash noted. No erythema.  Psychiatric: She has a normal mood and affect. Her behavior is normal. Judgment and thought content normal.     Assessment/Plan This is a 27 year old female admitted for C. difficile colitis. 1.  C. difficile colitis: The patient has never completed treatment.  I have restarted oral vancomycin.  Advance p.o. intake as tolerated.  The patient was supposed to undergo colonoscopy this week.  Currently she does not have evidence of active inflammation on CT scan.  She was seen by GI in the outpatient clinic 5 days ago. 2.  Pelvic inflammatory disease: The patient has not had any instrumentation of the cervix.  She received a dose of Flagyl in the emergency department but we will not need to continue this.  I have ordered another dose of IV azithromycin for tomorrow after which she must complete at least 5 more days of azithromycin.  (Doxycycline is also an  option). 3.  Leukocytosis: Significant but the patient does not complete criteria for sepsis.  Exline 4.  DVT prophylaxis: Lovenox 5.  GI prophylaxis: None The patient is a full code.  Time spent on admission orders and patient care approximately 45 minutes  Harrie Foreman, MD 10/05/2017, 5:59 AM

## 2017-10-05 NOTE — Care Management (Signed)
RNCM consult received for medication assistance. Spoke with pharmacist, Rachel Bo. Medication will cost $6500. I informed him that medication management would not cover this. I did provide him with the medication management number to further discuss this medication with the pharmacist though. He plans to contact the manufacturer to get the medication approved.

## 2017-10-05 NOTE — ED Provider Notes (Signed)
[facility] Emergency Department Provider Note ____________________________________________   First MD Initiated Contact with Patient 10/05/17 0026     (approximate)  I have reviewed the triage vital signs and the nursing notes.   HISTORY  Chief Complaint Abdominal Pain    HPI Kathleen Owen is a 27 y.o. female with PMH as noted below who presents with lower abdominal pain over the last several days, gradual onset, worsening, associated with nausea and multiple episodes of vomiting, as well as with diarrhea which is relatively chronic and has been present for at least a few weeks.  The patient denies dysuria or any vaginal bleeding or discharge.  She also denies fever.  The patient states that she was recently diagnosed with both PID as well as with recurrent C. difficile colitis with a positive stool test several days ago.  The patient was prescribed an antibiotic but it was $6000 and she could not afford it.  An alternate antibiotic was prescribed but was still $1500 and she was not able to get this either.  She was told to come to the hospital.   Past Medical History:  Diagnosis Date  . 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 CD (Crohn's disease) ([place]) 07/30/2014  . Crohn's disease [facility]) Jan 2016  . Depression   . Miscarriage 07/18/2014    Patient Active Problem List   Diagnosis Date Noted  . C. difficile colitis 07/23/2017  . 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 Smoker 11/22/2014  . Recurrent vaginitis 11/22/2014  . 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 CD (Crohn's disease) ([place]) 07/30/2014  . Body mass index (BMI) of 27.0-27.9 in adult 10/03/2013    Past Surgical History:  Procedure Laterality Date  . DILATION AND CURETTAGE OF UTERUS    . DILATION AND EVACUATION N/A 12/02/2016   Procedure: DILATATION AND EVACUATION;  Surgeon:  Harlin Heys, MD;  Location: [facility] ORS;  Service: Gynecology;  Laterality: N/A;  . DILATION AND EVACUATION N/A 06/30/2017   Procedure: DILATATION AND EVACUATION;  Surgeon: Harlin Heys, MD;  Location: [facility] ORS;  Service: Gynecology;  Laterality: N/A;  . TONSILLECTOMY      Prior to Admission medications   Medication Sig Start Date End Date Taking? Authorizing Provider  acidophilus (RISAQUAD) CAPS capsule Take 2 capsules by mouth daily. Patient not taking: Reported on 09/27/2017 07/26/17   Epifanio Lesches, MD  dicyclomine (BENTYL) 10 MG capsule Take 1 capsule (10 mg total) by mouth 4 (four) times daily -  before meals and at bedtime. Patient not taking: Reported on 10/05/2017 09/27/17 10/27/17  Lin Landsman, MD  fidaxomicin (DIFICID) 200 MG TABS tablet Take 1 tablet (200 mg total) by mouth 2 (two) times daily for 14 days. Patient not taking: Reported on 10/05/2017 09/29/17 10/13/17  Lin Landsman, MD  ondansetron (ZOFRAN) 4 MG tablet Take 1 tablet (4 mg total) by mouth every 6 (six) hours as needed for nausea. Patient not taking: Reported on 09/27/2017 07/26/17   Epifanio Lesches, MD  sertraline (ZOLOFT) 50 MG tablet Take 1 tablet (50 mg total) by mouth daily. 07/07/17 08/06/17  Harlin Heys, MD  vancomycin (VANCOCIN) 125 MG capsule Take 1 capsule (125 mg total) by mouth 4 (four) times daily for 28 days. For 4 weeks Patient not taking: Reported on 10/05/2017 10/04/17 11/01/17  Lin Landsman, MD  vancomycin (VANCOCIN) 50 mg/mL oral solution  Take 2.5 mLs (125 mg total) by mouth 4 (four) times daily. For 11 days Patient not taking: Reported on 09/27/2017 07/26/17   Epifanio Lesches, MD    Allergies Aspirin; Ibuprofen; and Amoxicillin  Family History  Problem Relation Age of Onset  . Kidney disease Maternal Uncle   . Heart failure Maternal Uncle   . Heart failure Maternal Grandmother   . Hypertension Paternal Grandmother   . Thyroid disease Paternal Aunt     Social  History Social History   Tobacco Use  . Smoking status: Current Every Day Smoker    Packs/day: 0.50    Types: Cigarettes  . Smokeless tobacco: Never Used  Substance Use Topics  . Alcohol use: Yes    Comment: occasionally  . Drug use: No    Review of Systems  Constitutional: No fever. Eyes: No redness. ENT: No sore throat. Cardiovascular: Denies chest pain. Respiratory: Denies shortness of breath. Gastrointestinal: Positive for nausea and vomiting.  Positive for diarrhea. Genitourinary: Negative for dysuria.  Musculoskeletal: Negative for back pain. Skin: Negative for rash. Neurological: Negative for headache.   ____________________________________________   PHYSICAL EXAM:  VITAL SIGNS: ED Triage Vitals  Enc Vitals Group     BP 10/05/17 0001 125/77     Pulse Rate 10/05/17 0001 88     Resp 10/05/17 0001 20     Temp 10/05/17 0001 98 F (36.7 C)     Temp Source 10/05/17 0001 Oral     SpO2 10/05/17 0001 96 %     Weight 10/05/17 0003 158 lb (71.7 kg)     Height 10/05/17 0003 5\' 3"  (1.6 m)     Head Circumference --      Peak Flow --      Pain Score 10/05/17 0002 7     Pain Loc --      Pain Edu? --      Excl. in [place]? --     Constitutional: Alert and oriented.  Uncomfortable appearing but in no acute distress. Eyes: Conjunctivae are normal.  No scleral icterus. Head: Atraumatic. Nose: No congestion/rhinnorhea. Mouth/Throat: Mucous membranes are slightly dry.   Neck: Normal range of motion.  Cardiovascular:  Good peripheral circulation. Respiratory: Normal respiratory effort.  No retractions. Lungs CTAB. Gastrointestinal: Soft with moderate bilateral lower quadrant tenderness.  No distention.  Genitourinary: No flank tenderness. Musculoskeletal: Extremities warm and well perfused.  Neurologic:  Normal speech and language. No gross focal neurologic deficits are appreciated.  Skin:  Skin is warm and dry. No rash noted. Psychiatric: Mood and affect are normal.  Speech and behavior are normal.  ____________________________________________   LABS (all labs ordered are listed, but only abnormal results are displayed)  Labs Reviewed  COMPREHENSIVE METABOLIC PANEL - Abnormal; Notable for the following components:      Result Value   Glucose, Bld 113 (*)    All other components within normal limits  CBC - Abnormal; Notable for the following components:   WBC 18.1 (*)    All other components within normal limits  URINALYSIS, COMPLETE (UACMP) WITH MICROSCOPIC - Abnormal; Notable for the following components:   Color, Urine YELLOW (*)    APPearance HAZY (*)    All other components within normal limits  LIPASE, BLOOD  POC URINE PREG, ED  POCT PREGNANCY, URINE   ____________________________________________  EKG   ____________________________________________  RADIOLOGY  CT abdomen: No acute abnormality  ____________________________________________   PROCEDURES  Procedure(s) performed: No  Procedures  Critical Care performed: No ____________________________________________  INITIAL IMPRESSION / ASSESSMENT AND PLAN / ED COURSE  Pertinent labs & imaging results that were available during my care of the patient were reviewed by me and considered in my medical decision making (see chart for details).  27 year old female with history of C. difficile colitis (and possible history of Crohn's) presents with lower abdominal pain, chronic diarrhea, and acute vomiting and states she was diagnosed with both PID and recurrent C. difficile colitis but was unable to afford the antibiotics.  I reviewed the past medical records in Epic; the patient was most recently admitted in June for C. difficile colitis and sent home on antibiotics.  There is a note from Dr. Marius Ditch from 09/27/2017 recommending check of stool studies, and C. difficile PCR from 09/28/2017 is positive.  On exam, the patient is uncomfortable appearing but her vital signs are normal.   She has tenderness in bilateral lower quadrants and the remainder of the exam is as described above.  Overall presentation is consistent with likely C. difficile colitis, and patient reports history of positive swab showing PID.  We will obtain labs, give fluids and analgesia for symptom control and reassess.  Given the patient's chronic history, I would like to avoid CT if possible, however if her pain is persistently severe or there are concerning lab findings we may need imaging.  Although it is possible that the antibiotic issue could be resolved with a social work consult, I anticipate that the patient may need to be admitted if she is unable to tolerate p.o.   ----------------------------------------- 3:21 AM on 10/05/2017 -----------------------------------------  The patient had persistent relatively severe pain, so based on shared decision making with her we proceeded with a CT.  The CT shows no concerning acute findings.  However given the patient's pain level and the fact that she has not been able to tolerate p.o., she is not appropriate for discharge home.  I ordered IV antibiotics.  I signed the patient out to the hospitalist Dr. Marcille Blanco.  ____________________________________________   FINAL CLINICAL IMPRESSION(S) / ED DIAGNOSES  Final diagnoses:  C. difficile colitis  Lower abdominal pain      NEW MEDICATIONS STARTED DURING THIS VISIT:  New Prescriptions   No medications on file     Note:  This document was prepared using Dragon voice recognition software and may include unintentional dictation errors.    Arta Silence, MD 10/05/17 [phone]

## 2017-10-06 DIAGNOSIS — A0472 Enterocolitis due to Clostridium difficile, not specified as recurrent: Secondary | ICD-10-CM

## 2017-10-06 LAB — CBC
HCT: 37.6 % (ref 35.0–47.0)
HEMOGLOBIN: 12.8 g/dL (ref 12.0–16.0)
MCH: 31 pg (ref 26.0–34.0)
MCHC: 34.1 g/dL (ref 32.0–36.0)
MCV: 91 fL (ref 80.0–100.0)
Platelets: 203 10*3/uL (ref 150–440)
RBC: 4.14 MIL/uL (ref 3.80–5.20)
RDW: 14.2 % (ref 11.5–14.5)
WBC: 14 10*3/uL — ABNORMAL HIGH (ref 3.6–11.0)

## 2017-10-06 MED ORDER — HYDROMORPHONE HCL 1 MG/ML IJ SOLN
1.0000 mg | INTRAMUSCULAR | Status: DC | PRN
  Administered 2017-10-06: 0.75 mg via INTRAVENOUS
  Administered 2017-10-07 (×2): 1 mg via INTRAVENOUS
  Filled 2017-10-06 (×3): qty 1

## 2017-10-06 NOTE — Progress Notes (Addendum)
Pharmacy - Brief Note - Fidaxomicin procurement  Application for assistance sent to [employer] and approved. No cost to patient.  Medication to be mailed to address provided by patient (should arrive 8/15) Prescription sent to [facility] contracted pharmacy Dr. Estanislado Pandy aware of approval  Doreene Eland, PharmD, BCPS.   Work Cell: [phone] 10/06/2017 10:55 AM

## 2017-10-06 NOTE — Progress Notes (Addendum)
Kathleen Darby, MD [address]  [place]  [place], [place] [address]  Main: [phone]  Fax: [phone] Pager: [phone]   Subjective:  Pt reports ongoing nausea and episodes of emesis. Had 2 watery BMs today, continues to have lower abdominal pain   Objective: Vital signs in last 24 hours: Vitals:   10/05/17 1415 10/05/17 1951 10/06/17 0420 10/06/17 1321  BP: 106/69 105/67 (!) 96/57 101/61  Pulse: 76 87 88 83  Resp: 20 13 13 18   Temp: 98.4 F (36.9 C) 98.2 F (36.8 C) 98.9 F (37.2 C) (!) 97.3 F (36.3 C)  TempSrc: Oral Oral Oral Oral  SpO2: 100% 100% 100% 99%  Weight:   73.6 kg   Height:       Weight change: 1.95 kg  Intake/Output Summary (Last 24 hours) at 10/06/2017 1646 Last data filed at 10/06/2017 0400 Gross per 24 hour  Intake 713.75 ml  Output -  Net 713.75 ml     Exam: Heart:: Regular rate and rhythm or S1S2 present Lungs: clear to auscultation Abdomen: soft, mild diffuse lower abdominal tenderness, normal bowel sounds   Lab Results: CBC Latest Ref Rng & Units 10/06/2017 10/05/2017 07/24/2017  WBC 3.6 - 11.0 K/uL 14.0(H) 18.1(H) 10.5  Hemoglobin 12.0 - 16.0 g/dL 12.8 14.4 12.1  Hematocrit 35.0 - 47.0 % 37.6 41.3 35.7  Platelets 150 - 440 K/uL 203 248 207   Micro Results: Recent Results (from the past 240 hour(s))  C difficile quick scan w PCR reflex     Status: Abnormal   Collection Time: 09/28/17  6:45 PM  Result Value Ref Range Status   C Diff antigen POSITIVE (A) NEGATIVE Final   C Diff toxin NEGATIVE NEGATIVE Final   C Diff interpretation Results are indeterminate. See PCR results.  Final    Comment: Performed at [facility], [address] [place], [place] [address]  C. Diff by PCR, Reflexed     Status: Abnormal   Collection Time: 09/28/17  6:45 PM  Result Value Ref Range Status   Toxigenic C. Difficile by PCR POSITIVE (A) NEGATIVE Final    Comment: Positive for toxigenic C. difficile with little to no toxin  production. Only treat if clinical presentation suggests symptomatic illness. Performed at [facility], [address] [place], [place] [address]    Studies/Results: Ct Abdomen Pelvis W Contrast  Result Date: 10/05/2017 CLINICAL DATA:  Mid upper abdominal pain and lower abdominal pain. Previous diagnosis of C difficile and pelvic inflammatory disease. EXAM: CT ABDOMEN AND PELVIS WITH CONTRAST TECHNIQUE: Multidetector CT imaging of the abdomen and pelvis was performed using the standard protocol following bolus administration of intravenous contrast. CONTRAST:  180mL OMNIPAQUE IOHEXOL 300 MG/ML  SOLN COMPARISON:  07/23/2017 FINDINGS: Lower chest: Lung bases are clear. Hepatobiliary: No focal liver abnormality is seen. No gallstones, gallbladder wall thickening, or biliary dilatation. Pancreas: Unremarkable. No pancreatic ductal dilatation or surrounding inflammatory changes. Spleen: Normal in size without focal abnormality. Adrenals/Urinary Tract: Adrenal glands are unremarkable. Kidneys are normal, without renal calculi, focal lesion, or hydronephrosis. Bladder is unremarkable. Stomach/Bowel: Stomach, small bowel, and colon are not abnormally distended. Stool fills the colon. No wall thickening identified although under distention limits evaluation of bowel wall. Appendix is normal. Vascular/Lymphatic: No significant vascular findings are present. No enlarged abdominal or pelvic lymph nodes. Reproductive: Large fibroid in the dome of the uterus. Ovaries are symmetrical without significant enlargement. Small amount of free fluid in the pelvis is likely  physiologic. Other: No free air in the abdomen. Abdominal wall musculature appears intact. Musculoskeletal: No acute or significant osseous findings. IMPRESSION: No acute process demonstrated in the abdomen or pelvis. No evidence of bowel obstruction or inflammation. Uterine fibroid. Electronically Signed   By: Lucienne Capers M.D.   On: 10/05/2017  02:46   Medications: I have reviewed the patient's current medications. Scheduled Meds: . enoxaparin (LOVENOX) injection  40 mg Subcutaneous Q24H  . feeding supplement  1 Container Oral TID BM  . fidaxomicin  200 mg Oral BID  . nicotine  21 mg Transdermal Daily   Continuous Infusions: . sodium chloride 75 mL/hr at 10/06/17 0626   PRN Meds:.acetaminophen **OR** acetaminophen, HYDROmorphone (DILAUDID) injection, ondansetron **OR** ondansetron (ZOFRAN) IV, oxyCODONE-acetaminophen, promethazine   Assessment: Active Problems:   C. difficile colitis  Slowly improving  Plan: C. Difficile diarrhea: Continue Fidaxomicin 200 mg twice daily for 10 days total, she got approved for outpatient therapy as well Check fecal calprotectin  PID: On antibiotics  Advance diet as tolerated     LOS: 1 day   [name] 10/06/2017, 4:46 PM

## 2017-10-06 NOTE — Consult Note (Signed)
Consult Note  Requesting Attending Physician :  Saundra Shelling, MD   Assessment/Recommendations:  EGAN SAHLIN is a 27 y.o. female seen in consultation at the request of Pyreddy, Reatha Harps, MD regarding possible PID.  ASSESSMENT: 1. C-diff 2. Fundal uterine fibroid 3. Possible (soft) diagnosis of PID.  PLAN: 1. Pt has received Ceftriaxone and has been started on Doxycyline.  This is appropriate treatment of PID (to complete a 14day course of doxy) 2. Treatment of C-diff per Pyreddy and ID service. 3.  Remote outpt f/u for discussion of uterine fibroid.    @HPIBEGIN @:  Reason for Consult:  Pelvic pain (especially with intercourse) and question of PID based on [facility]  visit on Monday.  Diagnosis at [facility] was made based on CMT and slight bloody vaginal discharge. Subsequent GC/CT NEGATIVE.  Pt did not have fever and WBC not evaluated.  The possibility of PID is being covered now with her current abx regimen.  It is possible that all of her current pelvic symptoms are secondary to C-diff and uterine fibroid.  The dx made Monday was soft based mostly on history and symptoms and with negative GC/CT the true possiblity of PID is somewhat unlikely.  Never-the-less she is currently covered with Ceftriaxone and doxy.    Obstetric History:  OB History  Gravida Para Term Preterm AB Living  10       10    SAB TAB Ectopic Multiple Live Births  8 1          # Outcome Date GA Lbr Len/2nd Weight Sex Delivery Anes PTL Lv  10 SAB 2018          9 SAB 2017          8 SAB 07/22/14          7 SAB 04/23/14          6 SAB 03/23/14          5 SAB 03/23/13          4 TAB 07/21/08          3 AB 2007     TAB     2 SAB           1 SAB         ND    GYN History:   Social History: Social History   Socioeconomic History  . Marital status: Single    Spouse name: Not on file  . Number of children: Not on file  . Years of education: Not on file  . Highest education level: Not on file    Occupational History  . Not on file  Social Needs  . Financial resource strain: Very hard  . Food insecurity:    Worry: Patient refused    Inability: Patient refused  . Transportation needs:    Medical: Patient refused    Non-medical: Patient refused  Tobacco Use  . Smoking status: Current Every Day Smoker    Packs/day: 2.00    Types: Cigars  . Smokeless tobacco: Never Used  Substance and Sexual Activity  . Alcohol use: Yes    Comment: occasionally  . Drug use: No  . Sexual activity: Yes    Birth control/protection: None  Lifestyle  . Physical activity:    Days per week: Patient refused    Minutes per session: Patient refused  . Stress: Only a little  Relationships  . Social connections:    Talks on phone: Patient refused  Gets together: Patient refused    Attends religious service: Patient refused    Active member of club or organization: Patient refused    Attends meetings of clubs or organizations: Patient refused    Relationship status: Patient refused  Other Topics Concern  . Not on file  Social History Narrative  . Not on file    Family History: family history includes Heart failure in her maternal grandmother and maternal uncle; Hypertension in her paternal grandmother; Kidney disease in her maternal uncle; Thyroid disease in her paternal aunt.    Objective :  Vital signs in last 24 hours: Temp:  [97.3 F (36.3 C)-98.9 F (37.2 C)] 97.3 F (36.3 C) (08/14 1321) Pulse Rate:  [83-88] 83 (08/14 1321) Resp:  [13-18] 18 (08/14 1321) BP: (96-105)/(57-67) 101/61 (08/14 1321) SpO2:  [99 %-100 %] 99 % (08/14 1321) Weight:  [73.6 kg] 73.6 kg (08/14 0420)  Intake/Output last 3 shifts: I/O last 3 completed shifts: In: 2163.8 [I.V.:713.8; IV HTMBPJPET:6244] Out: -    Imaging:  CT CLINICAL DATA:  Mid upper abdominal pain and lower abdominal pain. Previous diagnosis of C difficile and pelvic inflammatory disease.  EXAM: CT ABDOMEN AND PELVIS WITH  CONTRAST  TECHNIQUE: Multidetector CT imaging of the abdomen and pelvis was performed using the standard protocol following bolus administration of intravenous contrast.  CONTRAST:  112m OMNIPAQUE IOHEXOL 300 MG/ML  SOLN  COMPARISON:  07/23/2017  FINDINGS: Lower chest: Lung bases are clear.  Hepatobiliary: No focal liver abnormality is seen. No gallstones, gallbladder wall thickening, or biliary dilatation.  Pancreas: Unremarkable. No pancreatic ductal dilatation or surrounding inflammatory changes.  Spleen: Normal in size without focal abnormality.  Adrenals/Urinary Tract: Adrenal glands are unremarkable. Kidneys are normal, without renal calculi, focal lesion, or hydronephrosis. Bladder is unremarkable.  Stomach/Bowel: Stomach, small bowel, and colon are not abnormally distended. Stool fills the colon. No wall thickening identified although under distention limits evaluation of bowel wall. Appendix is normal.  Vascular/Lymphatic: No significant vascular findings are present. No enlarged abdominal or pelvic lymph nodes.  Reproductive: Large fibroid in the dome of the uterus. Ovaries are symmetrical without significant enlargement. Small amount of free fluid in the pelvis is likely physiologic.  Other: No free air in the abdomen. Abdominal wall musculature appears intact.  Musculoskeletal: No acute or significant osseous findings.  IMPRESSION: No acute process demonstrated in the abdomen or pelvis. No evidence of bowel obstruction or inflammation. Uterine fibroid.   Electronically Signed   By: WLucienne CapersM.D.   On: 10/05/2017 02:46

## 2017-10-06 NOTE — Progress Notes (Signed)
[place] [facility] [place] NAME: Kathleen Owen    MR#:  [id number]  DATE OF BIRTH:  [birth year]  SUBJECTIVE:  CHIEF COMPLAINT:   Chief Complaint  Patient presents with  . Abdominal Pain  Seen and evaluated this morning Has decreased abdominal discomfort Has nausea few episodes of vomiting Has decreased diarrhea  REVIEW OF SYSTEMS:    ROS  CONSTITUTIONAL: No documented fever. Has fatigue, weakness. No weight gain, no weight loss.  EYES: No blurry or double vision.  ENT: No tinnitus. No postnasal drip. No redness of the oropharynx.  RESPIRATORY: No cough, no wheeze, no hemoptysis. No dyspnea.  CARDIOVASCULAR: No chest pain. No orthopnea. No palpitations. No syncope.  GASTROINTESTINAL: Has nausea, Has vomiting and some diarrhea. Has mild abdominal pain. No melena or hematochezia.  GENITOURINARY: No dysuria or hematuria.  ENDOCRINE: No polyuria or nocturia. No heat or cold intolerance.  HEMATOLOGY: No anemia. No bruising. No bleeding.  INTEGUMENTARY: No rashes. No lesions.  MUSCULOSKELETAL: No arthritis. No swelling. No gout.  NEUROLOGIC: No numbness, tingling, or ataxia. No seizure-type activity.  PSYCHIATRIC: No anxiety. No insomnia. No ADD.   DRUG ALLERGIES:   Allergies  Allergen Reactions  . Aspirin Other (See Comments)    Chron's Disease  . Ibuprofen Other (See Comments)    Chron's Disease  . Amoxicillin Rash and Other (See Comments)    Has patient had a PCN reaction causing immediate rash, facial/tongue/throat swelling, SOB or lightheadedness with hypotension: Unknown Has patient had a PCN reaction causing severe rash involving mucus membranes or skin necrosis: Unknown Has patient had a PCN reaction that required hospitalization: Unknown Has patient had a PCN reaction occurring within the last 10 years: No If all of the above answers are "NO", then may proceed with Cephalosporin use.     VITALS:  Blood pressure 101/61, pulse  83, temperature (!) 97.3 F (36.3 C), temperature source Oral, resp. rate 18, height 5' 3"  (1.6 m), weight 73.6 kg, last menstrual period 09/16/2017, SpO2 99 %.  PHYSICAL EXAMINATION:   Physical Exam  GENERAL:  27 y.o.-year-old patient lying in the bed with no acute distress.  EYES: Pupils equal, round, reactive to light and accommodation. No scleral icterus. Extraocular muscles intact.  HEENT: Head atraumatic, normocephalic. Oropharynx and nasopharynx clear.  NECK:  Supple, no jugular venous distention. No thyroid enlargement, no tenderness.  LUNGS: Normal breath sounds bilaterally, no wheezing, rales, rhonchi. No use of accessory muscles of respiration.  CARDIOVASCULAR: S1, S2 normal. No murmurs, rubs, or gallops.  ABDOMEN: Soft, Mild tenderness around umbilicus, nondistended. Bowel sounds present. No organomegaly or mass.  EXTREMITIES: No cyanosis, clubbing or edema b/l.    NEUROLOGIC: Cranial nerves II through XII are intact. No focal Motor or sensory deficits b/l.   PSYCHIATRIC: The patient is alert and oriented x 3.  SKIN: No obvious rash, lesion, or ulcer.   LABORATORY PANEL:   CBC Recent Labs  Lab 10/06/17 0443  WBC 14.0*  HGB 12.8  HCT 37.6  PLT 203   ------------------------------------------------------------------------------------------------------------------ Chemistries  Recent Labs  Lab 10/05/17 0009  NA 138  K 4.1  CL 104  CO2 28  GLUCOSE 113*  BUN 8  CREATININE 0.78  CALCIUM 9.3  AST 18  ALT 14  ALKPHOS 50  BILITOT 0.8   ------------------------------------------------------------------------------------------------------------------  Cardiac Enzymes No results for input(s): TROPONINI in the last 168 hours. ------------------------------------------------------------------------------------------------------------------  RADIOLOGY:  Ct Abdomen Pelvis W Contrast  Result Date: 10/05/2017 CLINICAL DATA:  Mid upper abdominal pain and lower  abdominal pain. Previous diagnosis of C difficile and pelvic inflammatory disease. EXAM: CT ABDOMEN AND PELVIS WITH CONTRAST TECHNIQUE: Multidetector CT imaging of the abdomen and pelvis was performed using the standard protocol following bolus administration of intravenous contrast. CONTRAST:  178m OMNIPAQUE IOHEXOL 300 MG/ML  SOLN COMPARISON:  07/23/2017 FINDINGS: Lower chest: Lung bases are clear. Hepatobiliary: No focal liver abnormality is seen. No gallstones, gallbladder wall thickening, or biliary dilatation. Pancreas: Unremarkable. No pancreatic ductal dilatation or surrounding inflammatory changes. Spleen: Normal in size without focal abnormality. Adrenals/Urinary Tract: Adrenal glands are unremarkable. Kidneys are normal, without renal calculi, focal lesion, or hydronephrosis. Bladder is unremarkable. Stomach/Bowel: Stomach, small bowel, and colon are not abnormally distended. Stool fills the colon. No wall thickening identified although under distention limits evaluation of bowel wall. Appendix is normal. Vascular/Lymphatic: No significant vascular findings are present. No enlarged abdominal or pelvic lymph nodes. Reproductive: Large fibroid in the dome of the uterus. Ovaries are symmetrical without significant enlargement. Small amount of free fluid in the pelvis is likely physiologic. Other: No free air in the abdomen. Abdominal wall musculature appears intact. Musculoskeletal: No acute or significant osseous findings. IMPRESSION: No acute process demonstrated in the abdomen or pelvis. No evidence of bowel obstruction or inflammation. Uterine fibroid. Electronically Signed   By: WLucienne CapersM.D.   On: 10/05/2017 02:46     ASSESSMENT AND PLAN:  27year old female patient with history of C. difficile colitis, Crohn's disease currently under hospitalist service for abdominal discomfort and recurrent C. difficile colitis.  -Recurrent C. difficile colitis Gastroenterology consultation  appreciated Oral fidaxomicin to continue Case management and pharmacy working for medication approval for outpatient therapy Monitor electrolytes  -Nausea and vomiting improving slowly Antiemetic medication  -Pelvic inflammatory disease Patient has PID according to local health department Gynecology consultation for advised on antibiotics  -Tobacco abuse Tobacco cessation counseled to the patient for 6 minutes Nicotine patch offered  -DVT prophylaxis subcu Lovenox daily  -Abdominal pain secondary to C. difficile colitis improved Continue IV morphine and oral Percocet as needed for pain Pregnancy test is negative   All the records are reviewed and case discussed with Care Management/Social Worker. Management plans discussed with the patient, family and they are in agreement.  CODE STATUS: Full code  DVT Prophylaxis: SCDs  TOTAL TIME TAKING CARE OF THIS PATIENT: 33 minutes.   POSSIBLE D/C IN 1 DAYS, DEPENDING ON CLINICAL CONDITION.  PSaundra ShellingM.D on 10/06/2017 at 1:53 PM  Between 7am to 6pm - Pager - [phone]  After 6pm go to [email or web address] - password [email or web address] [email or web address]  Office  [phone] CC: Primary care physician; CPauline GoodJThornell Mule MD  Note: This dictation was prepared with Dragon dictation along with smaller phrase technology. Any transcriptional errors that result from this process are unintentional.

## 2017-10-07 ENCOUNTER — Telehealth: Payer: Self-pay

## 2017-10-07 LAB — CBC
HCT: 37.4 % (ref 35.0–47.0)
Hemoglobin: 12.8 g/dL (ref 12.0–16.0)
MCH: 31.2 pg (ref 26.0–34.0)
MCHC: 34.2 g/dL (ref 32.0–36.0)
MCV: 91.2 fL (ref 80.0–100.0)
PLATELETS: 211 10*3/uL (ref 150–440)
RBC: 4.1 MIL/uL (ref 3.80–5.20)
RDW: 13.9 % (ref 11.5–14.5)
WBC: 13.5 10*3/uL — ABNORMAL HIGH (ref 3.6–11.0)

## 2017-10-07 MED ORDER — KETOROLAC TROMETHAMINE 30 MG/ML IJ SOLN
30.0000 mg | Freq: Four times a day (QID) | INTRAMUSCULAR | Status: DC | PRN
  Administered 2017-10-07: 30 mg via INTRAVENOUS
  Filled 2017-10-07: qty 1

## 2017-10-07 MED ORDER — METRONIDAZOLE 0.75 % VA GEL
1.0000 | Freq: Every day | VAGINAL | Status: DC
  Administered 2017-10-07: 1 via VAGINAL
  Filled 2017-10-07: qty 70

## 2017-10-07 MED ORDER — ONDANSETRON HCL 4 MG PO TABS: 4.0000 mg | ORAL_TABLET | Freq: Three times a day (TID) | ORAL | 0 refills | Status: DC | PRN

## 2017-10-07 MED ORDER — METRONIDAZOLE 0.75 % VA GEL: 1.0000 | Freq: Every day | VAGINAL | 0 refills | Status: DC

## 2017-10-07 MED ORDER — FIDAXOMICIN 200 MG PO TABS: 200.0000 mg | ORAL_TABLET | Freq: Two times a day (BID) | ORAL | 0 refills | Status: AC

## 2017-10-07 NOTE — Progress Notes (Signed)
[facility] [place] NAME: Kathleen Owen    MR#:  [id number]  Henry:  March 05, 1990  SUBJECTIVE:   Patient still complaining of some abdominal pain and also had some episodes of nausea and diarrhea but has not been witnessed.  Patient using scheduled Dilaudid and also using as needed Percocet.  REVIEW OF SYSTEMS:    Review of Systems  Constitutional: Negative for chills and fever.  HENT: Negative for congestion and tinnitus.   Eyes: Negative for blurred vision and double vision.  Respiratory: Negative for cough, shortness of breath and wheezing.   Cardiovascular: Negative for chest pain, orthopnea and PND.  Gastrointestinal: Positive for abdominal pain and diarrhea. Negative for nausea and vomiting.  Genitourinary: Negative for dysuria and hematuria.  Neurological: Negative for dizziness, sensory change and focal weakness.  All other systems reviewed and are negative.    Nutrition: Regular diet Tolerating Diet: Yes Tolerating PT: Amublatory  DRUG ALLERGIES:   Allergies  Allergen Reactions  . Aspirin Other (See Comments)    Chron's Disease  . Ibuprofen Other (See Comments)    Chron's Disease  . Amoxicillin Rash and Other (See Comments)    Has patient had a PCN reaction causing immediate rash, facial/tongue/throat swelling, SOB or lightheadedness with hypotension: Unknown Has patient had a PCN reaction causing severe rash involving mucus membranes or skin necrosis: Unknown Has patient had a PCN reaction that required hospitalization: Unknown Has patient had a PCN reaction occurring within the last 10 years: No If all of the above answers are "NO", then may proceed with Cephalosporin use.     VITALS:  Blood pressure 111/65, pulse 85, temperature 97.9 F (36.6 C), temperature source Oral, resp. rate 14, height 5\' 3"  (1.6 m), weight 77.1 kg, last menstrual period 09/16/2017, SpO2 97 %.  PHYSICAL EXAMINATION:   Physical  Exam  GENERAL:  27 y.o.-year-old patient lying in bed in no acute distress.  EYES: Pupils equal, round, reactive to light and accommodation. No scleral icterus. Extraocular muscles intact.  HEENT: Head atraumatic, normocephalic. Oropharynx and nasopharynx clear.  NECK:  Supple, no jugular venous distention. No thyroid enlargement, no tenderness.  LUNGS: Normal breath sounds bilaterally, no wheezing, rales, rhonchi. No use of accessory muscles of respiration.  CARDIOVASCULAR: S1, S2 normal. No murmurs, rubs, or gallops.  ABDOMEN: Soft, nontender, nondistended. Bowel sounds present. No organomegaly or mass.  EXTREMITIES: No cyanosis, clubbing or edema b/l.    NEUROLOGIC: Cranial nerves II through XII are intact. No focal Motor or sensory deficits b/l.   PSYCHIATRIC: The patient is alert and oriented x 3.  SKIN: No obvious rash, lesion, or ulcer.    LABORATORY PANEL:   CBC Recent Labs  Lab 10/07/17 0423  WBC 13.5*  HGB 12.8  HCT 37.4  PLT 211   ------------------------------------------------------------------------------------------------------------------  Chemistries  Recent Labs  Lab 10/05/17 0009  NA 138  K 4.1  CL 104  CO2 28  GLUCOSE 113*  BUN 8  CREATININE 0.78  CALCIUM 9.3  AST 18  ALT 14  ALKPHOS 50  BILITOT 0.8   ------------------------------------------------------------------------------------------------------------------  Cardiac Enzymes No results for input(s): TROPONINI in the last 168 hours. ------------------------------------------------------------------------------------------------------------------  RADIOLOGY:  No results found.   ASSESSMENT AND PLAN:   27 year old female with past medical history of Crohn's disease, previous history of depression, who presents to the hospital due to abdominal pain and diarrhea and noted to positive for C. Diff.   1.  Recurrent C. difficile  diarrhea-seen by gastroenterology and patient has failed oral  vancomycin and now is on fidaxomicin. - Pharmacy is helping arrange oral fidaxomicin as an outpatient. - Patient is afebrile and hemodynamically stable.  She is still complaining of some nausea and diarrhea, and will continue supportive care for that.  2.  Abdominal pain-suspected to be secondary to underlying C. difficile diarrhea with Crohn's disease.  Patient CT abdomen pelvis on admission was negative for acute pathology. - Continue oral Percocet, patient has high drug-seeking behavior, will DC IV Dilaudid, place some IV Toradol as needed for pain.  3.  Suspected PID- this was based off on exam the health department. -Seen by GYN, they recommend 14 days of oral doxycycline.  Patient has also received 1 dose of IM Rocephin. - Given her C. difficile we will hold off on giving doxycycline and patient has been started on metronidazole gel.  4.  Tobacco abuse-continue nicotine patch.  Possible d/c later today or tomorrow if feeling better.   All the records are reviewed and case discussed with Care Management/Social Worker. Management plans discussed with the patient, family and they are in agreement.  CODE STATUS: Full code  DVT Prophylaxis: Lovenox  TOTAL TIME TAKING CARE OF THIS PATIENT: 30 minutes.   POSSIBLE D/C IN 1-2 DAYS, DEPENDING ON CLINICAL CONDITION.   Henreitta Leber M.D on 10/07/2017 at 11:59 AM  Between 7am to 6pm - Pager - [phone]  After 6pm go to [email or web address] - Technical brewer [facility]  Office  [phone]  CC: Primary care physician; Pauline Good, Thornell Mule, MD

## 2017-10-07 NOTE — Progress Notes (Signed)
Pt is being discharged home. Discharge papers given and explained to pt. Pt verbalized understanding. Meds reviewed. RX given.

## 2017-10-08 NOTE — Discharge Summary (Signed)
[facility] NAME: Kathleen Owen    MR#:  [id number]  DATE OF BIRTH:  [birth year]  DATE OF ADMISSION:  10/05/2017 ADMITTING PHYSICIAN: Harrie Foreman, MD  DATE OF DISCHARGE: 10/07/2017  5:05 PM  PRIMARY CARE PHYSICIAN: Stoney Bang, MD    ADMISSION DIAGNOSIS:  Lower abdominal pain [R10.30] C. difficile colitis [A04.72]  DISCHARGE DIAGNOSIS:  Active Problems:   C. difficile colitis   SECONDARY DIAGNOSIS:   Past Medical History:  Diagnosis Date  . 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 CD (Crohn's disease) ([place]) 07/30/2014  . Crohn's disease [facility]) Jan 2016  . Depression   . Miscarriage 07/18/2014    HOSPITAL COURSE:   27 year old female with past medical history of Crohn's disease, previous history of depression, who presents to the hospital due to abdominal pain and diarrhea and noted to positive for C. Diff.   1.  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 - Pharmacy helped the getting fidaxomicin as an outpatient which was delivered by mail to pt's home.  -She continues to have some mild nausea and diarrhea but it was never witnessed in the hospital.  She is tolerating p.o. well.  She will finish her Fidaxomycin course and follow-up with gastroenterology as an outpatient.  2.  Abdominal pain-suspected to be secondary to underlying C. difficile diarrhea with Crohn's disease.  Patient CT abdomen pelvis on admission was negative for acute pathology.  3.  Suspected PID- this was based off on exam at the health department. -Seen by GYN, they recommended 14 days of oral doxycycline.  Patient has also received 1 dose of IM Rocephin.  - Given her C. difficile we will hold off on giving doxycycline and patient was started on metronidazole gel and discharged on that.  4.     DePression-patient will continue her Zoloft.  DISCHARGE CONDITIONS:   Stable.   CONSULTS OBTAINED:  Treatment Team:  Lin Landsman, MD Tsosie Billing, MD Harlin Heys, MD  DRUG ALLERGIES:   Allergies  Allergen Reactions  . Aspirin Other (See Comments)    Chron's Disease  . Ibuprofen Other (See Comments)    Chron's Disease  . Amoxicillin Rash and Other (See Comments)    Has patient had a PCN reaction causing immediate rash, facial/tongue/throat swelling, SOB or lightheadedness with hypotension: Unknown Has patient had a PCN reaction causing severe rash involving mucus membranes or skin necrosis: Unknown Has patient had a PCN reaction that required hospitalization: Unknown Has patient had a PCN reaction occurring within the last 10 years: No If all of the above answers are "NO", then may proceed with Cephalosporin use.     DISCHARGE MEDICATIONS:   Allergies as of 10/07/2017      Reactions   Aspirin Other (See Comments)   Chron's Disease   Ibuprofen Other (See Comments)   Chron's Disease   Amoxicillin Rash, Other (See Comments)   Has patient had a PCN reaction causing immediate rash, facial/tongue/throat swelling, SOB or lightheadedness with hypotension: Unknown Has patient had a PCN reaction causing severe rash involving mucus membranes or skin necrosis: Unknown Has patient had a PCN reaction that required hospitalization: Unknown Has patient had a PCN reaction occurring within the last 10 years: No If all of the above answers are "NO", then may proceed with Cephalosporin use.      Medication  List    STOP taking these medications   acidophilus Caps capsule   dicyclomine 10 MG capsule Commonly known as:  BENTYL   vancomycin 125 MG capsule Commonly known as:  VANCOCIN   vancomycin 50 mg/mL  oral solution Commonly known as:  VANCOCIN     TAKE these medications   fidaxomicin 200 MG Tabs tablet Commonly known as:  DIFICID Take 1 tablet (200  mg total) by mouth 2 (two) times daily for 10 days.   metroNIDAZOLE 0.75 % vaginal gel Commonly known as:  METROGEL Place 1 Applicatorful vaginally at bedtime.   ondansetron 4 MG tablet Commonly known as:  ZOFRAN Take 1 tablet (4 mg total) by mouth every 8 (eight) hours as needed for nausea or vomiting. What changed:    when to take this  reasons to take this   sertraline 50 MG tablet Commonly known as:  ZOLOFT Take 1 tablet (50 mg total) by mouth daily.         DISCHARGE INSTRUCTIONS:   DIET:  Regular diet  DISCHARGE CONDITION:  Stable  ACTIVITY:  Activity as tolerated  OXYGEN:  Home Oxygen: No.   Oxygen Delivery: room air  DISCHARGE LOCATION:  home   If you experience worsening of your admission symptoms, develop shortness of breath, life threatening emergency, suicidal or homicidal thoughts you must seek medical attention immediately by calling 911 or calling your MD immediately  if symptoms less severe.  You Must read complete instructions/literature along with all the possible adverse reactions/side effects for all the Medicines you take and that have been prescribed to you. Take any new Medicines after you have completely understood and accpet all the possible adverse reactions/side effects.   Please note  You were cared for by a hospitalist during your hospital stay. If you have any questions about your discharge medications or the care you received while you were in the hospital after you are discharged, you can call the unit and asked to speak with the hospitalist on call if the hospitalist that took care of you is not available. Once you are discharged, your primary care physician will handle any further medical issues. Please note that NO REFILLS for any discharge medications will be authorized once you are discharged, as it is imperative that you return to your primary care physician (or establish a relationship with a primary care physician if you do not  have one) for your aftercare needs so that they can reassess your need for medications and monitor your lab values.    DATA REVIEW:   CBC Recent Labs  Lab 10/07/17 0423  WBC 13.5*  HGB 12.8  HCT 37.4  PLT 211    Chemistries  Recent Labs  Lab 10/05/17 0009  NA 138  K 4.1  CL 104  CO2 28  GLUCOSE 113*  BUN 8  CREATININE 0.78  CALCIUM 9.3  AST 18  ALT 14  ALKPHOS 50  BILITOT 0.8    Cardiac Enzymes No results for input(s): TROPONINI in the last 168 hours.  Microbiology Results  Results for orders placed or performed during the hospital encounter of 09/28/17  C difficile quick scan w PCR reflex     Status: Abnormal   Collection Time: 09/28/17  6:45 PM  Result Value Ref Range Status   C Diff antigen POSITIVE (A) NEGATIVE Final   C Diff toxin NEGATIVE NEGATIVE Final   C Diff interpretation Results are indeterminate. See PCR results.  Final    Comment: Performed  at [facility], [address] [place], [place] [address]  C. Diff by PCR, Reflexed     Status: Abnormal   Collection Time: 09/28/17  6:45 PM  Result Value Ref Range Status   Toxigenic C. Difficile by PCR POSITIVE (A) NEGATIVE Final    Comment: Positive for toxigenic C. difficile with little to no toxin production. Only treat if clinical presentation suggests symptomatic illness. Performed at [facility], [address], [place], [place] [address]     RADIOLOGY:  No results found.    Management plans discussed with the patient, family and they are in agreement.  CODE STATUS:  Code Status History    Date Active Date Inactive Code Status Order ID Comments User Context   10/05/2017 0624 10/07/2017 2005 Full Code [id number]  Harrie Foreman, MD Inpatient    TOTAL TIME TAKING CARE OF THIS PATIENT: 40 minutes.    Henreitta Leber M.D on 10/08/2017 at 4:56 PM  Between 7am to 6pm - Pager - [phone]  After 6pm go to [email or web address] - Technical brewer  [facility]  Office  [phone]  CC: Primary care physician; Pauline Good, Thornell Mule, MD

## 2017-10-11 ENCOUNTER — Ambulatory Visit: Payer: Medicaid Other | Admitting: Gastroenterology

## 2017-10-11 ENCOUNTER — Encounter

## 2017-10-19 ENCOUNTER — Telehealth: Payer: Self-pay | Admitting: Gastroenterology

## 2017-10-19 NOTE — Telephone Encounter (Signed)
Pt left vm she just completed her rx for c-diff she would like a call bc she believes c-diff has not left yet, please advise?  Thanks TG

## 2017-10-19 NOTE — Telephone Encounter (Signed)
Pt left vm she just completed her rx for c-diff she would like a call bc she believes c-diff has not left yet

## 2017-10-20 ENCOUNTER — Other Ambulatory Visit: Payer: Self-pay

## 2017-10-20 MED ORDER — DICYCLOMINE HCL 20 MG PO TABS: 20.0000 mg | ORAL_TABLET | Freq: Three times a day (TID) | ORAL | 0 refills | Status: DC

## 2017-10-20 NOTE — Telephone Encounter (Signed)
Patient called back wanting to know what to do. She has finished her medication for C diff but still having diarrhea. Please call

## 2017-10-20 NOTE — Telephone Encounter (Signed)
LVM asking to return call to office and notified pt that a prescription for Bentyl has been sent to pharmacy

## 2017-10-25 NOTE — Telephone Encounter (Signed)
Closing encounter

## 2017-10-26 NOTE — Telephone Encounter (Signed)
Bentyl 20 mg has been prescribed and pt has been taking it also provided samples of VSL #3 and she picked up in office on Friday 10/22/17

## 2017-11-22 ENCOUNTER — Telehealth: Payer: Self-pay

## 2017-11-22 NOTE — Telephone Encounter (Signed)
Pt calling for confirmation of preg and to possibly see AMS.  [phone]

## 2017-11-24 NOTE — Telephone Encounter (Signed)
Spoke with patient and she stated she no longer needs to be seen

## 2017-12-01 DIAGNOSIS — K567 Ileus, unspecified: Secondary | ICD-10-CM | POA: Insufficient documentation

## 2017-12-01 DIAGNOSIS — R109 Unspecified abdominal pain: Secondary | ICD-10-CM | POA: Insufficient documentation

## 2018-03-15 ENCOUNTER — Encounter: Payer: Self-pay | Admitting: Obstetrics and Gynecology

## 2018-05-13 ENCOUNTER — Emergency Department
Admission: EM | Admit: 2018-05-13 | Discharge: 2018-05-13 | Disposition: A | Discharge status: Home / Self Care | Payer: Medicaid Other | Attending: Emergency Medicine | Admitting: Emergency Medicine

## 2018-05-13 ENCOUNTER — Other Ambulatory Visit: Payer: Self-pay

## 2018-05-13 ENCOUNTER — Encounter: Payer: Self-pay | Admitting: Emergency Medicine

## 2018-05-13 DIAGNOSIS — N390 Urinary tract infection, site not specified: Secondary | ICD-10-CM | POA: Insufficient documentation

## 2018-05-13 DIAGNOSIS — F17210 Nicotine dependence, cigarettes, uncomplicated: Secondary | ICD-10-CM | POA: Insufficient documentation

## 2018-05-13 LAB — URINALYSIS, COMPLETE (UACMP) WITH MICROSCOPIC
BACTERIA UA: NONE SEEN
BILIRUBIN URINE: NEGATIVE
Glucose, UA: NEGATIVE mg/dL
Ketones, ur: NEGATIVE mg/dL
Nitrite: NEGATIVE
PROTEIN: 100 mg/dL — AB
RBC / HPF: >50 RBC/hpf — ABNORMAL HIGH (ref 0–5)
Specific Gravity, Urine: 1.02 (ref 1.005–1.030)
WBC, UA: >50 WBC/hpf — ABNORMAL HIGH (ref 0–5)
pH: 5 (ref 5.0–8.0)

## 2018-05-13 LAB — POCT PREGNANCY, URINE: PREG TEST UR: NEGATIVE

## 2018-05-13 MED ORDER — PHENAZOPYRIDINE HCL 200 MG PO TABS
200.0000 mg | ORAL_TABLET | Freq: Once | ORAL | Status: AC
  Administered 2018-05-13: 200 mg via ORAL
  Filled 2018-05-13: qty 1

## 2018-05-13 MED ORDER — SULFAMETHOXAZOLE-TRIMETHOPRIM 800-160 MG PO TABS
1.0000 | ORAL_TABLET | Freq: Once | ORAL | Status: AC
Start: 2018-05-13 — End: 2018-05-13
  Administered 2018-05-13: 1 via ORAL
  Filled 2018-05-13: qty 1

## 2018-05-13 MED ORDER — OXYCODONE-ACETAMINOPHEN 7.5-325 MG PO TABS: 1.0000 | ORAL_TABLET | Freq: Four times a day (QID) | ORAL | 0 refills | Status: DC | PRN

## 2018-05-13 MED ORDER — OXYCODONE-ACETAMINOPHEN 5-325 MG PO TABS
1.0000 | ORAL_TABLET | Freq: Once | ORAL | Status: AC
  Administered 2018-05-13: 1 via ORAL
  Filled 2018-05-13: qty 1

## 2018-05-13 MED ORDER — PHENAZOPYRIDINE HCL 200 MG PO TABS: 200.0000 mg | ORAL_TABLET | Freq: Three times a day (TID) | ORAL | 0 refills | Status: DC | PRN

## 2018-05-13 MED ORDER — SULFAMETHOXAZOLE-TRIMETHOPRIM 800-160 MG PO TABS: 1.0000 | ORAL_TABLET | Freq: Two times a day (BID) | ORAL | 0 refills | Status: DC

## 2018-05-13 NOTE — ED Provider Notes (Signed)
[facility] Emergency Department Provider Note   ____________________________________________   First MD Initiated Contact with Patient 05/13/18 1344     (approximate)  I have reviewed the triage vital signs and the nursing notes.   HISTORY  Chief Complaint Dysuria    HPI Kathleen Owen is a 28 y.o. female patient presents with 1 month of dysuria.  Patient has been trying to self treat at home with over-the-counter medications.  Patient denies flank pain.  Patient denies vaginal discharge.  Patient denies fever.  Patient suspect blood in urine today.  Patient denies malodor.  Patient rates her pain as a 7/10.  Patient described the pain is "aching".  No other palliative measures for complaint.      Past Medical History:  Diagnosis Date  . 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 CD (Crohn's disease) ([place]) 07/30/2014  . Crohn's disease [facility]) Jan 2016  . Depression   . Miscarriage 07/18/2014    Patient Active Problem List   Diagnosis Date Noted  . C. difficile colitis 07/23/2017  . 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 Smoker 11/22/2014  . Recurrent vaginitis 11/22/2014  . 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 CD (Crohn's disease) ([place]) 07/30/2014  . Body mass index (BMI) of 27.0-27.9 in adult 10/03/2013    Past Surgical History:  Procedure Laterality Date  . DILATION AND CURETTAGE OF UTERUS    . DILATION AND EVACUATION N/A 12/02/2016   Procedure: DILATATION AND EVACUATION;  Surgeon: Harlin Heys, MD;  Location: [facility] ORS;  Service: Gynecology;  Laterality: N/A;  . DILATION AND EVACUATION N/A 06/30/2017   Procedure: DILATATION AND EVACUATION;  Surgeon: Harlin Heys, MD;  Location: [facility] ORS;  Service: Gynecology;  Laterality: N/A;  . TONSILLECTOMY      Prior to Admission medications   Medication Sig Start Date End Date  Taking? Authorizing Provider  dicyclomine (BENTYL) 20 MG tablet Take 1 tablet (20 mg total) by mouth 4 (four) times daily -  before meals and at bedtime. 10/20/17 11/19/17  Lin Landsman, MD  metroNIDAZOLE (METROGEL) 0.75 % vaginal gel Place 1 Applicatorful vaginally at bedtime. 10/08/17   Henreitta Leber, MD  oxyCODONE-acetaminophen (PERCOCET) 7.5-325 MG tablet Take 1 tablet by mouth every 6 (six) hours as needed. 05/13/18   Sable Feil, PA-C  phenazopyridine (PYRIDIUM) 200 MG tablet Take 1 tablet (200 mg total) by mouth 3 (three) times daily as needed for pain. 05/13/18   Sable Feil, PA-C  sertraline (ZOLOFT) 50 MG tablet Take 1 tablet (50 mg total) by mouth daily. 07/07/17 08/06/17  Harlin Heys, MD  sulfamethoxazole-trimethoprim (BACTRIM DS,SEPTRA DS) 800-160 MG tablet Take 1 tablet by mouth 2 (two) times daily. 05/13/18   Sable Feil, PA-C    Allergies Aspirin; Ibuprofen; and Amoxicillin  Family History  Problem Relation Age of Onset  . Kidney disease Maternal Uncle   . Heart failure Maternal Uncle   . Heart failure Maternal Grandmother   . Hypertension Paternal Grandmother   . Thyroid disease Paternal Aunt     Social History Social History   Tobacco Use  . Smoking status: Current Every Day Smoker    Packs/day: 2.00    Types: Cigars  . Smokeless tobacco: Never Used  Substance Use Topics  . Alcohol use: Yes    Comment: occasionally  . Drug use: No    Review  of Systems Constitutional: No fever/chills Eyes: No visual changes. ENT: No sore throat. Cardiovascular: Denies chest pain. Respiratory: Denies shortness of breath. Gastrointestinal: No abdominal pain.  No nausea, no vomiting.  No diarrhea.  No constipation. Genitourinary: Positive for dysuria. Musculoskeletal: Negative for back pain. Skin: Negative for rash. Neurological: Negative for headaches, focal weakness or numbness. Allergic/Immunilogical: See medication list.  ____________________________________________   PHYSICAL EXAM:  VITAL SIGNS: ED Triage Vitals  Enc Vitals Group     BP 05/13/18 1331 129/77     Pulse Rate 05/13/18 1331 93     Resp 05/13/18 1331 18     Temp 05/13/18 1331 99.3 F (37.4 C)     Temp Source 05/13/18 1331 Oral     SpO2 05/13/18 1331 99 %     Weight 05/13/18 1335 160 lb (72.6 kg)     Height 05/13/18 1335 5' 3"  (1.6 m)     Head Circumference --      Peak Flow --      Pain Score 05/13/18 1335 7     Pain Loc --      Pain Edu? --      Excl. in [place]? --    Constitutional: Alert and oriented.  Moderate distress. Cardiovascular: Normal rate, regular rhythm. Grossly normal heart sounds.  Good peripheral circulation. Respiratory: Normal respiratory effort.  No retractions. Lungs CTAB. Gastrointestinal: Soft and nontender. No distention. No abdominal bruits. No CVA tenderness. Genitourinary: Suprapubic guarding Musculoskeletal: No lower extremity tenderness nor edema.  No joint effusions. Skin:  Skin is warm, dry and intact. No rash noted.  ____________________________________________   LABS (all labs ordered are listed, but only abnormal results are displayed)  Labs Reviewed  URINALYSIS, COMPLETE (UACMP) WITH MICROSCOPIC - Abnormal; Notable for the following components:      Result Value   Color, Urine YELLOW (*)    APPearance TURBID (*)    Hgb urine dipstick LARGE (*)    Protein, ur 100 (*)    Leukocytes,Ua MODERATE (*)    RBC / HPF >50 (*)    WBC, UA >50 (*)    All other components within normal limits  POC URINE PREG, ED  POCT PREGNANCY, URINE   ____________________________________________  EKG   ____________________________________________  RADIOLOGY  ED MD interpretation:    Official radiology report(s): No results found.  ____________________________________________   PROCEDURES  Procedure(s) performed (including Critical Care):  Procedures   ____________________________________________    INITIAL IMPRESSION / ASSESSMENT AND PLAN / ED COURSE  As part of my medical decision making, I reviewed the following data within the [facility]         Patient presents with 1 month history of dysuria.  Patient have a consistent with cystitis with hematuria.  Patient given discharge care instruction.  Urine culture was ordered.  Patient advised take medication as directed and follow-up with PCP in 10 days.  Treatment ED if condition worsens.      ____________________________________________   FINAL CLINICAL IMPRESSION(S) / ED DIAGNOSES  Final diagnoses:  Urinary tract infection without hematuria, site unspecified     ED Discharge Orders         Ordered    sulfamethoxazole-trimethoprim (BACTRIM DS,SEPTRA DS) 800-160 MG tablet  2 times daily     05/13/18 1411    phenazopyridine (PYRIDIUM) 200 MG tablet  3 times daily PRN     05/13/18 1411    oxyCODONE-acetaminophen (PERCOCET) 7.5-325 MG tablet  Every 6 hours PRN  05/13/18 1411           Note:  This document was prepared using Dragon voice recognition software and may include unintentional dictation errors.    Sable Feil, PA-C 05/13/18 1417    Lavonia Drafts, MD 05/16/18 [id number]

## 2018-05-13 NOTE — ED Triage Notes (Signed)
Patient presents to the ED with dysuria x 1 month.  Patient reports lower back aches and cold chills.  Patient reports noting hematuria today.

## 2018-05-13 NOTE — ED Notes (Signed)
Pt reporting she had had pain for approx a month. When urinating pt verbalized feeling as though she is unable to empty her bladder. Hx of a bladder infection that pt states felt similar. Small amount of blood noted on toilet paper after wiping today.   Lower abd pain started today (6/10 stabbing pain at rest and 9/10 burning when urinating) No NVD. No vaginal changes. Pt is hunched over while walking to treatment room and in obvious discomfort, tenderness reported upon palpation of lower abd.

## 2018-05-16 LAB — URINE CULTURE
Culture: >=100000 — AB
Special Requests: NORMAL

## 2018-07-14 ENCOUNTER — Ambulatory Visit (INDEPENDENT_AMBULATORY_CARE_PROVIDER_SITE_OTHER): Payer: Medicaid Other | Admitting: Obstetrics and Gynecology

## 2018-07-14 ENCOUNTER — Other Ambulatory Visit: Payer: Self-pay

## 2018-07-14 ENCOUNTER — Encounter: Payer: Self-pay | Admitting: Obstetrics and Gynecology

## 2018-07-14 VITALS — BP 108/72 | HR 94 | Ht 63.0 in | Wt 163.0 lb

## 2018-07-14 DIAGNOSIS — O3680X0 Pregnancy with inconclusive fetal viability, not applicable or unspecified: Secondary | ICD-10-CM

## 2018-07-14 DIAGNOSIS — N96 Recurrent pregnancy loss: Secondary | ICD-10-CM

## 2018-07-14 DIAGNOSIS — R300 Dysuria: Secondary | ICD-10-CM

## 2018-07-14 DIAGNOSIS — O2620 Pregnancy care for patient with recurrent pregnancy loss, unspecified trimester: Secondary | ICD-10-CM

## 2018-07-14 DIAGNOSIS — Z538 Procedure and treatment not carried out for other reasons: Secondary | ICD-10-CM

## 2018-07-14 DIAGNOSIS — Z3201 Encounter for pregnancy test, result positive: Secondary | ICD-10-CM

## 2018-07-14 LAB — POCT URINE PREGNANCY: Preg Test, Ur: POSITIVE — AB

## 2018-07-14 NOTE — Patient Instructions (Signed)
[name]

## 2018-07-14 NOTE — Progress Notes (Signed)
Obstetrics & Gynecology Office Visit   Chief Complaint:  Chief Complaint  Patient presents with  . Amenorrhea    Positive test at home/ faint/negative in office    History of Present Illness: 28 year old G10P00100 with strong history of early pregnancy loss who presents with faint positive urine pregnancy test today.  She has had not cramping or bleeding.  Patient's last menstrual period was 06/14/2018 (exact date). Work up to date has included coagulopathy work up (anticardiolipin, phospholipid antibody screening, Factor V Leiden), thyroid screening, and karyotype (not yet done on partner).     Review of Systems: Review of Systems  Constitutional: Negative.   Gastrointestinal: Negative.   Genitourinary: Negative.     Past Medical History:  Past Medical History:  Diagnosis Date  . 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 CD (Crohn's disease) ([place]) 07/30/2014  . Crohn's disease [facility]) Jan 2016  . Depression   . Miscarriage 07/18/2014    Past Surgical History:  Past Surgical History:  Procedure Laterality Date  . DILATION AND CURETTAGE OF UTERUS    . DILATION AND EVACUATION N/A 12/02/2016   Procedure: DILATATION AND EVACUATION;  Surgeon: Harlin Heys, MD;  Location: [facility] ORS;  Service: Gynecology;  Laterality: N/A;  . DILATION AND EVACUATION N/A 06/30/2017   Procedure: DILATATION AND EVACUATION;  Surgeon: Harlin Heys, MD;  Location: [facility] ORS;  Service: Gynecology;  Laterality: N/A;  . TONSILLECTOMY      Gynecologic History: Patient's last menstrual period was 06/14/2018 (exact date).  Obstetric History: G10P00100  Family History:  Family History  Problem Relation Age of Onset  . Kidney disease Maternal Uncle   . Heart failure Maternal Uncle   . Heart failure Maternal Grandmother   . Hypertension Paternal Grandmother   . Thyroid disease Paternal Aunt     Social History:  Social History   Socioeconomic  History  . Marital status: Single    Spouse name: Not on file  . Number of children: Not on file  . Years of education: Not on file  . Highest education level: Not on file  Occupational History  . Not on file  Social Needs  . Financial resource strain: Very hard  . Food insecurity:    Worry: Patient refused    Inability: Patient refused  . Transportation needs:    Medical: Patient refused    Non-medical: Patient refused  Tobacco Use  . Smoking status: Current Every Day Smoker    Packs/day: 2.00    Types: Cigars  . Smokeless tobacco: Never Used  Substance and Sexual Activity  . Alcohol use: Yes    Comment: occasionally  . Drug use: No  . Sexual activity: Yes    Birth control/protection: None  Lifestyle  . Physical activity:    Days per week: Patient refused    Minutes per session: Patient refused  . Stress: Only a little  Relationships  . Social connections:    Talks on phone: Patient refused    Gets together: Patient refused    Attends religious service: Patient refused    Active member of club or organization: Patient refused    Attends meetings of clubs or organizations: Patient refused    Relationship status: Patient refused  . Intimate partner violence:    Fear of current or ex partner: Patient refused    Emotionally abused: Patient refused    Physically abused: Patient refused    Forced  sexual activity: Patient refused  Other Topics Concern  . Not on file  Social History Narrative  . Not on file    Allergies:  Allergies  Allergen Reactions  . Aspirin Other (See Comments)    Chron's Disease  . Ibuprofen Other (See Comments)    Chron's Disease  . Amoxicillin Rash and Other (See Comments)    Has patient had a PCN reaction causing immediate rash, facial/tongue/throat swelling, SOB or lightheadedness with hypotension: Unknown Has patient had a PCN reaction causing severe rash involving mucus membranes or skin necrosis: Unknown Has patient had a PCN  reaction that required hospitalization: Unknown Has patient had a PCN reaction occurring within the last 10 years: No If all of the above answers are "NO", then may proceed with Cephalosporin use.     Medications: Prior to Admission medications   Not on File    Physical Exam Vitals:  Vitals:   07/14/18 1340  BP: 108/72  Pulse: 94   Patient's last menstrual period was 06/14/2018 (exact date).  General: NAD HEENT: normocephalic, anicteric Pulmonary: No increased work of breathing Neurologic: Grossly intact Psychiatric: mood appropriate, affect full  Female chaperone present for pelvic  portions of the physical exam  Assessment: 28 y.o. G10P00100 with possible faint positive UPT in the setting of recurrent pregnancy loss  Plan: Problem List Items Addressed This Visit      Other   Recurrent pregnancy loss with current pregnancy   Relevant Orders   Progesterone   Beta hCG quant (ref lab)   Habitual aborter   Relevant Orders   Progesterone   Beta hCG quant (ref lab)    Other Visit Diagnoses    Dysuria    -  Primary   Relevant Orders   Urine Culture   Positive urine pregnancy test       Relevant Orders   Progesterone   Beta hCG quant (ref lab)   POCT urine pregnancy (Completed)   Pregnancy, location unknown       Relevant Orders   Progesterone   Beta hCG quant (ref lab)   Pregnancy with inconclusive fetal viability, single or unspecified fetus       Relevant Orders   Progesterone   Beta hCG quant (ref lab)   POCT urine pregnancy (Completed)      Definition of recurrent miscarriage, defined as 3 or more successive first trimester losses discussed in detail.  The most commonly identifiable etiology is antiphospholipid antibody syndrome (APS).  While APS is the only thrombophilia with a clearly proven association for first trimester miscarriage, other hypercoagulable disorders while showing a weak or inconsistent relationship to first trimester miscarriage are  often times still tested for,  These include Factor V Leiden, MTHFR (homozygous), prothrombin gene mutation, Protein C and S deficiency.  Uterine structural lesions, endocrine factors (thyoid disease, diabetes, and PCOS), and disorders in parental karyotype has also been implicated.  Use of daily Asprin has been evaluated in the setting of recurrent pregnancy loss and shows no clear benefit in patient without antiphospholipid antibody syndrome.  Approximately 50% of couple with recurrent miscarriage will not have a readily identifiable etiology  Several studies have examined and established a role for emotional support and stress reduction in improving pregnancy outcomes for patient with recurrent miscarriage. The tender love and care model utilizes weekly to twice weekly follow up, more frequent ultrasound, and has consistently demonstrated improved live birth rates in several studies.  The risk of miscarriage following documentation of fetal heart tones  drops significantly to approximately 3%.  At present there is no evidence to support the use of vaginal progesterone in women with a history of unexplained recurrent first trimester miscarriage in an attempt to minimize risk of miscarriage in a subsequent pregnancy.  Its use did not show a clinically significant increase in live birth rate.  However, there were also no identified adverse events to its use.  This data was shared with the patient.    "A Randomized Trial of Progesterone in Women with Recurrent Miscarriages" NEJM 373;22 January 18, 2014  However, a recent trial evaluating the use of progesterone supplementation in the setting of first trimester bleeding did show benefit in the subgroup of patient with three or more first trimester losses.  PRISM trial was a multicenter, double-blinded, randomized control trial conducted in the [place] looking at the effects of progesterone supplementation in women with threatened miscarriage. Women were  adminstered 433m of vaginal progesterone twice daily from onset of bleeding through [redacted] weeks gestation.  The trial included 450women.  There was no significant improvement in livebirth rates demonstrated in the overall cohort (delivery after 34 weeks).  However in women with a history of one prior miscarriage progesterone group 76% resulted in liveborn pregnancy vs 72% for placebo (RR 1.05 95% CI 1.00 to 1.12).  For women with three or more first trimester miscarriages the effect was more pronounced with 72% liveborn pregnancy rate vs 57% for placebo (RR 1.28 95% CI 1.08 to 1.151).  "A Randomized Trial of Progesterone in Women with Bleeding in Early Pregnancy" ATammi Sou MD et al NEJM 2019, 380:1815-1824  - at present will proceed with trending HCG trends - progesterone level ordered  A total of 15 minutes were spent in face-to-face contact with the patient during this encounter with over half of that time devoted to counseling and coordination of care  Return in about 4 days (around 07/18/2018) for blood work. .[name] MD, [facility]ardon[facility], [facility] 07/14/2018, 8:24 PM

## 2018-07-15 LAB — PROGESTERONE: Progesterone: 21.2 ng/mL

## 2018-07-15 LAB — BETA HCG QUANT (REF LAB): hCG Quant: 45 m[IU]/mL

## 2018-07-16 LAB — URINE CULTURE

## 2018-07-19 ENCOUNTER — Telehealth: Payer: Self-pay

## 2018-07-19 ENCOUNTER — Other Ambulatory Visit: Payer: Medicaid Other

## 2018-07-19 ENCOUNTER — Other Ambulatory Visit: Payer: Self-pay | Admitting: Obstetrics and Gynecology

## 2018-07-19 DIAGNOSIS — O2341 Unspecified infection of urinary tract in pregnancy, first trimester: Secondary | ICD-10-CM

## 2018-07-19 DIAGNOSIS — N96 Recurrent pregnancy loss: Secondary | ICD-10-CM

## 2018-07-19 DIAGNOSIS — O2340 Unspecified infection of urinary tract in pregnancy, unspecified trimester: Secondary | ICD-10-CM | POA: Insufficient documentation

## 2018-07-19 MED ORDER — NITROFURANTOIN MONOHYD MACRO 100 MG PO CAPS: 100.0000 mg | ORAL_CAPSULE | Freq: Two times a day (BID) | ORAL | 1 refills | Status: DC

## 2018-07-19 NOTE — Telephone Encounter (Signed)
Please advise 

## 2018-07-19 NOTE — Telephone Encounter (Signed)
Pt calling to see what AMS would have her do about her next appt; started bleeding today so possibly is miscarrying.  She cancelled lab appt for today.  She also would like UTI results as she is still having painful urination.  [phone]

## 2018-08-11 ENCOUNTER — Other Ambulatory Visit: Payer: Self-pay | Admitting: Obstetrics and Gynecology

## 2018-08-11 DIAGNOSIS — N946 Dysmenorrhea, unspecified: Secondary | ICD-10-CM

## 2018-08-11 NOTE — Telephone Encounter (Signed)
Dr. Marga Melnick, We currently have no opening in ultrasound until Tuesday, 08/16/18. Please advise or place order for [place] to schedule patient at an out patient facility.

## 2018-08-11 NOTE — Telephone Encounter (Signed)
6/23 should be fine if she has a negative home pregnancy test

## 2018-08-11 NOTE — Telephone Encounter (Signed)
Patient is schedule 08/17/18 at [place] for ultrasound and follow up with AMS in [facility]

## 2018-08-11 NOTE — Telephone Encounter (Signed)
GYN [place] and follow up sometime today or tomorrow

## 2018-08-17 ENCOUNTER — Ambulatory Visit: Payer: Medicaid Other | Admitting: Obstetrics and Gynecology

## 2018-08-17 ENCOUNTER — Other Ambulatory Visit: Payer: Medicaid Other

## 2018-09-05 ENCOUNTER — Ambulatory Visit: Payer: Medicaid Other

## 2018-09-05 ENCOUNTER — Ambulatory Visit: Payer: Medicaid Other | Admitting: Obstetrics and Gynecology

## 2018-09-29 ENCOUNTER — Ambulatory Visit (INDEPENDENT_AMBULATORY_CARE_PROVIDER_SITE_OTHER): Payer: Self-pay | Admitting: Obstetrics and Gynecology

## 2018-09-29 ENCOUNTER — Other Ambulatory Visit: Payer: Self-pay

## 2018-09-29 ENCOUNTER — Encounter: Payer: Self-pay | Admitting: Obstetrics and Gynecology

## 2018-09-29 VITALS — BP 109/83 | HR 86 | Wt 153.0 lb

## 2018-09-29 DIAGNOSIS — F329 Major depressive disorder, single episode, unspecified: Secondary | ICD-10-CM

## 2018-09-29 DIAGNOSIS — O2620 Pregnancy care for patient with recurrent pregnancy loss, unspecified trimester: Secondary | ICD-10-CM

## 2018-09-29 DIAGNOSIS — F32A Depression, unspecified: Secondary | ICD-10-CM

## 2018-09-29 DIAGNOSIS — F419 Anxiety disorder, unspecified: Secondary | ICD-10-CM

## 2018-09-29 MED ORDER — SERTRALINE HCL 50 MG PO TABS: 50.0000 mg | ORAL_TABLET | Freq: Every day | ORAL | 2 refills | Status: DC

## 2018-09-29 NOTE — Progress Notes (Signed)
Obstetrics & Gynecology Office Visit   Chief Complaint:  Chief Complaint  Patient presents with  . Amenorrhea    positive home preg. test/ER U/S negative    History of Present Illness: The patient is a 28 y.o. female presenting initial evaluation for symptoms of anxiety and depression.  The patient is currently taking nothing for the management of her symptoms.  She has had any recent situational , multiple pregnancy failures.  She reports symptoms of anhedonia, day time somnolence, insomnia, irritability, social anxiety, feelings of guilt and feelings of worthlessness.  She denies risk taking behavior, agorophobia, suicidal ideation, homicidal ideation, auditory hallucinations and visual hallucinations.   Review of Systems: Review of Systems  Constitutional: Negative.   Gastrointestinal: Positive for abdominal pain.  Psychiatric/Behavioral: Positive for depression. Negative for hallucinations, memory loss, substance abuse and suicidal ideas. The patient is nervous/anxious and has insomnia.     Past Medical History:  Past Medical History:  Diagnosis Date  . 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 CD (Crohn's disease) ([place]) 07/30/2014  . Crohn's disease [facility] Jan 2016  . Depression   . Miscarriage 07/18/2014    Past Surgical History:  Past Surgical History:  Procedure Laterality Date  . DILATION AND CURETTAGE OF UTERUS    . DILATION AND EVACUATION N/A 12/02/2016   Procedure: DILATATION AND EVACUATION;  Surgeon: Harlin Heys, MD;  Location: [facility] ORS;  Service: Gynecology;  Laterality: N/A;  . DILATION AND EVACUATION N/A 06/30/2017   Procedure: DILATATION AND EVACUATION;  Surgeon: Harlin Heys, MD;  Location: [facility] ORS;  Service: Gynecology;  Laterality: N/A;  . TONSILLECTOMY      Gynecologic History: Patient's last menstrual period was 08/21/2018 (exact date).  Obstetric History: [id number]  Family History:  Family  History  Problem Relation Age of Onset  . Kidney disease Maternal Uncle   . Heart failure Maternal Uncle   . Heart failure Maternal Grandmother   . Hypertension Paternal Grandmother   . Thyroid disease Paternal Aunt     Social History:  Social History   Socioeconomic History  . Marital status: Single    Spouse name: Not on file  . Number of children: Not on file  . Years of education: Not on file  . Highest education level: Not on file  Occupational History  . Not on file  Social Needs  . Financial resource strain: Very hard  . Food insecurity    Worry: Patient refused    Inability: Patient refused  . Transportation needs    Medical: Patient refused    Non-medical: Patient refused  Tobacco Use  . Smoking status: Current Every Day Smoker    Packs/day: 2.00    Types: Cigars  . Smokeless tobacco: Never Used  Substance and Sexual Activity  . Alcohol use: Yes    Comment: occasionally  . Drug use: No  . Sexual activity: Yes    Birth control/protection: None  Lifestyle  . Physical activity    Days per week: Patient refused    Minutes per session: Patient refused  . Stress: Only a little  Relationships  . Social Herbalist on phone: Patient refused    Gets together: Patient refused    Attends religious service: Patient refused    Active member of club or organization: Patient refused    Attends meetings of clubs or organizations: Patient refused    Relationship status: Patient refused  .  Intimate partner violence    Fear of current or ex partner: Patient refused    Emotionally abused: Patient refused    Physically abused: Patient refused    Forced sexual activity: Patient refused  Other Topics Concern  . Not on file  Social History Narrative  . Not on file    Allergies:  Allergies  Allergen Reactions  . Aspirin Other (See Comments)    Chron's Disease  . Ibuprofen Other (See Comments)    Chron's Disease  . Amoxicillin Rash and Other (See  Comments)    Has patient had a PCN reaction causing immediate rash, facial/tongue/throat swelling, SOB or lightheadedness with hypotension: Unknown Has patient had a PCN reaction causing severe rash involving mucus membranes or skin necrosis: Unknown Has patient had a PCN reaction that required hospitalization: Unknown Has patient had a PCN reaction occurring within the last 10 years: No If all of the above answers are "NO", then may proceed with Cephalosporin use.     Medications: Prior to Admission medications   Medication Sig Start Date End Date Taking? Authorizing Provider  acetaminophen (TYLENOL) 500 MG tablet Take by mouth.   Yes [provider]  oxyCODONE (OXY IR/ROXICODONE) 5 MG immediate release tablet Take by mouth. 09/27/18 10/02/18 Yes [provider]  sertraline (ZOLOFT) 50 MG tablet Take 1 tablet (50 mg total) by mouth daily. 09/29/18   [name], MD    Physical Exam Vitals:  Vitals:   09/29/18 1534  BP: 109/83  Pulse: 86   Patient's last menstrual period was 08/21/2018 (exact date).  General: NAD HEENT: normocephalic, anicteric Pulmonary: No increased work of breathing Neurologic: Grossly intact Psychiatric: mood appropriate, affect full    No flowsheet data found.  Depression screen PHQ 2/9 09/29/2018  Decreased Interest 3  Down, Depressed, Hopeless 3  PHQ - 2 Score 6  Altered sleeping 3  Tired, decreased energy 3  Change in appetite 3  Feeling bad or failure about yourself  3  Trouble concentrating 3  Moving slowly or fidgety/restless 1  Suicidal thoughts (No Data)  PHQ-9 Score 22  Difficult doing work/chores Extremely dIfficult    Depression screen PHQ 2/9 09/29/2018  Decreased Interest 3  Down, Depressed, Hopeless 3  PHQ - 2 Score 6  Altered sleeping 3  Tired, decreased energy 3  Change in appetite 3  Feeling bad or failure about yourself  3  Trouble concentrating 3  Moving slowly or fidgety/restless 1  Suicidal thoughts  (No Data)  PHQ-9 Score 22  Difficult doing work/chores Extremely dIfficult     Assessment: 28 y.o. [id number] anxiety and depression, recurrent pregnancy loss  Plan: Problem List Items Addressed This Visit      Other   Recurrent pregnancy loss with current pregnancy   Relevant Orders   Ambulatory referral to Endocrinology    Other Visit Diagnoses    Anxiety and depression    -  Primary   Relevant Medications   sertraline (ZOLOFT) 50 MG tablet      1) Reports another positive pregnancy test with negative HCG at [facility]  Imaging at [facility] for abdominal pain negative.  She is interested in proceed with REI evaluation at this point.    2) Depression at least in part situational secondary to her desire to conceive with multiple documented chemical pregnancies and 1st trimester miscarriages.  Will start on Zoloft 98m  3) Thyroid creen has been obtained   4) Return in about 2 weeks (around 10/13/2018) for 2-3 week medication  follow up phone.    [name], MD, [facility], [place]

## 2018-10-13 ENCOUNTER — Ambulatory Visit (INDEPENDENT_AMBULATORY_CARE_PROVIDER_SITE_OTHER): Payer: Self-pay | Admitting: Obstetrics and Gynecology

## 2018-10-13 ENCOUNTER — Other Ambulatory Visit: Payer: Self-pay

## 2018-10-13 ENCOUNTER — Encounter: Payer: Self-pay | Admitting: Obstetrics and Gynecology

## 2018-10-13 DIAGNOSIS — F329 Major depressive disorder, single episode, unspecified: Secondary | ICD-10-CM

## 2018-10-13 DIAGNOSIS — N96 Recurrent pregnancy loss: Secondary | ICD-10-CM

## 2018-10-13 DIAGNOSIS — F419 Anxiety disorder, unspecified: Secondary | ICD-10-CM

## 2018-10-13 DIAGNOSIS — F32A Depression, unspecified: Secondary | ICD-10-CM

## 2018-10-13 MED ORDER — ESCITALOPRAM OXALATE 10 MG PO TABS: 10.0000 mg | ORAL_TABLET | Freq: Every day | ORAL | 3 refills | Status: DC

## 2018-10-13 NOTE — Progress Notes (Signed)
I connected with Kathleen Owen  on 10/13/2018 at  4:10 PM EDT by telephone and verified that I am speaking with the correct person using two identifiers.   I discussed the limitations, risks, security and privacy concerns of performing an evaluation and management service by telephone and the availability of in person appointments. I also discussed with the patient that there may be a patient responsible charge related to this service. The patient expressed understanding and agreed to proceed.  The patient was at home I spoke with the patient from my workstation phone The names of people involved in this encounter were: Kathleen Owen , and [name] Visit   Chief Complaint:  Chief Complaint  Patient presents with  . Follow-up    anxiety/depression    History of Present Illness: The patient is a 28 y.o. female presenting follow up for symptoms of anxiety and depression.  The patient is currently taking Zoloft 54m for the management of her symptoms.  She has not had any recent situational stressors.  She reports symptoms of anhedonia, day time somnolence, insomnia, irritability, social anxiety, agorophobia, feelings of guilt and feelings of worthlessness.  She denies risk taking behavior, suicidal ideation, homicidal ideation, auditory hallucinations and visual hallucinations. Symptoms have remained unchanged since last visit.     The patient does not have a pre-existing history of depression and anxiety.  She  does not a prior history of suicide attempts.   Review of Systems: Review of Systems  Constitutional: Negative.   Gastrointestinal: Negative for nausea.  Neurological: Negative for headaches.  Psychiatric/Behavioral: Positive for depression. Negative for hallucinations, memory loss, substance abuse and suicidal ideas. The patient is nervous/anxious.     Past Medical History:  Past Medical History:  Diagnosis Date  . Airway  hyperreactivity 07/30/2014  . 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 [place]  . Cro[facility]'s disease ([facility] Jan 2016  . Depression   . Miscarriage 07/18/2014    Past Surgical History:  Past Surgical History:  Procedure Laterality Date  . DILATION AND CURETTAGE OF UTERUS    . DILATION AND EVACUATION N/A 12/02/2016   Procedure: DILATATION AND EVACUATION;  Surgeon: EHarlin Heys MD;  Location: [facility] ORS;  Service: Gynecology;  Laterality: N/A;  . DILATION AND EVACUATION N/A 06/30/2017   Procedure: DILATATION AND EVACUATION;  Surgeon: EHarlin Heys MD;  Location: [facility] ORS;  Service: Gynecology;  Laterality: N/A;  . TONSILLECTOMY      Gynecologic History: Patient's last menstrual period was 09/26/2018.  Obstetric History: [id number] Family History:  Family History  Problem Relation Age of Onset  . Kidney disease Maternal Uncle   . Heart failure Maternal Uncle   . Heart failure Maternal Grandmother   . Hypertension Paternal Grandmother   . Thyroid disease Paternal Aunt     Social History:  Social History   Socioeconomic History  . Marital status: Single    Spouse name: Not on file  . Number of children: Not on file  . Years of education: Not on file  . Highest education level: Not on file  Occupational History  . Not on file  Social Needs  . Financial resource strain: Very hard  . Food insecurity    Worry: Patient refused    Inability: Patient refused  . Transportation needs    Medical: Patient refused    Non-medical: Patient refused  Tobacco Use  . Smoking  status: Current Every Day Smoker    Packs/day: 2.00    Types: Cigars  . Smokeless tobacco: Never Used  Substance and Sexual Activity  . Alcohol use: Yes    Comment: occasionally  . Drug use: No  . Sexual activity: Yes    Birth control/protection: None  Lifestyle  . Physical activity    Days per week: Patient refused    Minutes per session: Patient refused  .  Stress: Only a little  Relationships  . Social Herbalist on phone: Patient refused    Gets together: Patient refused    Attends religious service: Patient refused    Active member of club or organization: Patient refused    Attends meetings of clubs or organizations: Patient refused    Relationship status: Patient refused  . Intimate partner violence    Fear of current or ex partner: Patient refused    Emotionally abused: Patient refused    Physically abused: Patient refused    Forced sexual activity: Patient refused  Other Topics Concern  . Not on file  Social History Narrative  . Not on file    Allergies:  Allergies  Allergen Reactions  . Aspirin Other (See Comments)    Chron's Disease  . Ibuprofen Other (See Comments)    Chron's Disease  . Amoxicillin Rash and Other (See Comments)    Has patient had a PCN reaction causing immediate rash, facial/tongue/throat swelling, SOB or lightheadedness with hypotension: Unknown Has patient had a PCN reaction causing severe rash involving mucus membranes or skin necrosis: Unknown Has patient had a PCN reaction that required hospitalization: Unknown Has patient had a PCN reaction occurring within the last 10 years: No If all of the above answers are "NO", then may proceed with Cephalosporin use.     Medications: Prior to Admission medications   Medication Sig Start Date End Date Taking? Authorizing Provider  acetaminophen (TYLENOL) 500 MG tablet Take by mouth.    [provider]  sertraline (ZOLOFT) 50 MG tablet Take 1 tablet (50 mg total) by mouth daily. 09/29/18   [name], MD    Physical Exam Vitals: There were no vitals filed for this visit. Patient's last menstrual period was 09/26/2018.  No physical exam as this was a remote telephone visit to promote social distancing during the current COVID-19 Pandemic  GAD 7 : Generalized Anxiety Score 10/13/2018  Nervous, Anxious, on Edge 3  Control/stop  worrying 3  Worry too much - different things 3  Trouble relaxing 3  Restless 3  Easily annoyed or irritable 3  Afraid - awful might happen 1  Total GAD 7 Score 19  Anxiety Difficulty Somewhat difficult    Depression screen [facility] 2/9 10/13/2018 09/29/2018  Decreased Interest 3 3  Down, Depressed, Hopeless 3 3  PHQ - 2 Score 6 6  Altered sleeping 3 3  Tired, decreased energy 3 3  Change in appetite 3 3  Feeling bad or failure about yourself  3 3  Trouble concentrating 3 3  Moving slowly or fidgety/restless 0 1  Suicidal thoughts 0 (No Data)  PHQ-9 Score 21 22  Difficult doing work/chores Extremely dIfficult Extremely dIfficult    Depression screen [facility] 2/9 10/13/2018 09/29/2018  Decreased Interest 3 3  Down, Depressed, Hopeless 3 3  PHQ - 2 Score 6 6  Altered sleeping 3 3  Tired, decreased energy 3 3  Change in appetite 3 3  Feeling bad or failure about yourself  3 3  Trouble concentrating 3 3  Moving slowly or fidgety/restless 0 1  Suicidal thoughts 0 (No Data)  PHQ-9 Score 21 22  Difficult doing work/chores Extremely dIfficult Extremely dIfficult     Assessment: 28 y.o. [id number] follow up anxiety and depression  Plan: Problem List Items Addressed This Visit    None      1) Anxiety / Depression  - discontinue Zoloft and switch to Lexapro 55m - Discussed counseling particular as her recurrent miscarriage history is playing into her symptoms to some extent - Has REI follow up in October  2) Thyroidhas been obtained previously  3) Telephone Time 17 minutes  4) Dysmenorrhea consider SIS given history of multiple D&C's  5) Return in about 1 week (around 10/20/2018) for medication follow up phone.   AMalachy Mood MD, FL[facility]ardon[facility]/GYN, [facility] 10/13/2018, 4:38 PM

## 2018-10-19 ENCOUNTER — Ambulatory Visit: Payer: Medicaid Other | Admitting: Obstetrics and Gynecology

## 2018-10-19 ENCOUNTER — Other Ambulatory Visit: Payer: Self-pay

## 2018-10-26 ENCOUNTER — Ambulatory Visit (INDEPENDENT_AMBULATORY_CARE_PROVIDER_SITE_OTHER): Payer: Medicaid Other | Admitting: Obstetrics and Gynecology

## 2018-10-26 ENCOUNTER — Encounter: Payer: Self-pay | Admitting: Obstetrics and Gynecology

## 2018-10-26 ENCOUNTER — Other Ambulatory Visit: Payer: Self-pay

## 2018-10-26 DIAGNOSIS — F329 Major depressive disorder, single episode, unspecified: Secondary | ICD-10-CM

## 2018-10-26 DIAGNOSIS — F419 Anxiety disorder, unspecified: Secondary | ICD-10-CM

## 2018-10-26 DIAGNOSIS — F32A Depression, unspecified: Secondary | ICD-10-CM

## 2018-10-26 MED ORDER — ESCITALOPRAM OXALATE 20 MG PO TABS: 20.0000 mg | ORAL_TABLET | Freq: Every day | ORAL | 2 refills | Status: DC

## 2018-10-26 MED ORDER — TRAZODONE HCL 50 MG PO TABS: 50.0000 mg | ORAL_TABLET | Freq: Every day | ORAL | 2 refills | Status: DC

## 2018-10-26 NOTE — Progress Notes (Signed)
I connected with Kathleen Owen  on 10/26/18 at 11:30 AM EDT by telephone and verified that I am speaking with the correct person using two identifiers.   I discussed the limitations, risks, security and privacy concerns of performing an evaluation and management service by telephone and the availability of in person appointments. I also discussed with the patient that there may be a patient responsible charge related to this service. The patient expressed understanding and agreed to proceed.  The patient was at home I spoke with the patient from my workstation phone The names of people involved in this encounter were: Kathleen Owen , and [facility] Visit   Chief Complaint:  Chief Complaint  Patient presents with  . Follow-up    Anxiety/depression    History of Present Illness: The patient is a 28 y.o. female presenting follow up for symptoms of anxiety and depression.  The patient is currently taking Lexapro 48m for the management of her symptoms.  She has not had any recent situational stressors.  The patient did open up today that she had some traumatic events in her child hood and there may be some component of PTSD.  She also has noted some intrusive thoughts and fears about bad thing happening to her.  She reports symptoms of anhedonia, day time somnolence, insomnia, irritability, social anxiety, agorophobia, feelings of guilt and feelings of worthlessness.  She denies risk taking behavior, suicidal ideation, homicidal ideation, auditory hallucinations and visual hallucinations. Symptoms have remained unchanged since last visit.     The patient does not have a pre-existing history of depression and anxiety.  She  does not a prior history of suicide attempts.    Review of Systems: Review of Systems  Constitutional: Negative.   Gastrointestinal: Negative for nausea.  Neurological: Negative for headaches.  Psychiatric/Behavioral: Positive  for depression. Negative for hallucinations, memory loss, substance abuse and suicidal ideas. The patient is nervous/anxious and has insomnia.      Past Medical History:  Past Medical History:  Diagnosis Date  . 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 CD (Crohn's disease) ([facility] 07/30/2014  . Crohn's disease ([facility] Jan 2016  . Depression   . Miscarriage 07/18/2014    Past Surgical History:  Past Surgical History:  Procedure Laterality Date  . DILATION AND CURETTAGE OF UTERUS    . DILATION AND EVACUATION N/A 12/02/2016   Procedure: DILATATION AND EVACUATION;  Surgeon: EHarlin Heys MD;  Location: [facility] ORS;  Service: Gynecology;  Laterality: N/A;  . DILATION AND EVACUATION N/A 06/30/2017   Procedure: DILATATION AND EVACUATION;  Surgeon: EHarlin Heys MD;  Location: [facility] ORS;  Service: Gynecology;  Laterality: N/A;  . TONSILLECTOMY      Gynecologic History: Patient's last menstrual period was 09/26/2018.  Obstetric History: [id number] Family History:  Family History  Problem Relation Age of Onset  . Kidney disease Maternal Uncle   . Heart failure Maternal Uncle   . Heart failure Maternal Grandmother   . Hypertension Paternal Grandmother   . Thyroid disease Paternal Aunt     Social History:  Social History   Socioeconomic History  . Marital status: Single    Spouse name: Not on file  . Number of children: Not on file  . Years of education: Not on file  . Highest education level: Not on file  Occupational History  . Not on file  Social Needs  .  Financial resource strain: Very hard  . Food insecurity    Worry: Patient refused    Inability: Patient refused  . Transportation needs    Medical: Patient refused    Non-medical: Patient refused  Tobacco Use  . Smoking status: Current Every Day Smoker    Packs/day: 2.00    Types: Cigars  . Smokeless tobacco: Never Used  Substance and Sexual Activity  . Alcohol use:  Yes    Comment: occasionally  . Drug use: No  . Sexual activity: Yes    Birth control/protection: None  Lifestyle  . Physical activity    Days per week: Patient refused    Minutes per session: Patient refused  . Stress: Only a little  Relationships  . Social Herbalist on phone: Patient refused    Gets together: Patient refused    Attends religious service: Patient refused    Active member of club or organization: Patient refused    Attends meetings of clubs or organizations: Patient refused    Relationship status: Patient refused  . Intimate partner violence    Fear of current or ex partner: Patient refused    Emotionally abused: Patient refused    Physically abused: Patient refused    Forced sexual activity: Patient refused  Other Topics Concern  . Not on file  Social History Narrative  . Not on file    Allergies:  Allergies  Allergen Reactions  . Aspirin Other (See Comments)    Chron's Disease  . Ibuprofen Other (See Comments)    Chron's Disease  . Amoxicillin Rash and Other (See Comments)    Has patient had a PCN reaction causing immediate rash, facial/tongue/throat swelling, SOB or lightheadedness with hypotension: Unknown Has patient had a PCN reaction causing severe rash involving mucus membranes or skin necrosis: Unknown Has patient had a PCN reaction that required hospitalization: Unknown Has patient had a PCN reaction occurring within the last 10 years: No If all of the above answers are "NO", then may proceed with Cephalosporin use.     Medications: Prior to Admission medications   Medication Sig Start Date End Date Taking? Authorizing Provider  acetaminophen (TYLENOL) 500 MG tablet Take by mouth.    [provider]  escitalopram (LEXAPRO) 10 MG tablet Take 1 tablet (10 mg total) by mouth daily. 10/13/18   [name], MD    Physical Exam Vitals: There were no vitals filed for this visit. Patient's last menstrual period was  09/26/2018.  No physical exam as this was a remote telephone visit to promote social distancing during the current COVID-19 Pandemic   GAD 7 : Generalized Anxiety Score 10/13/2018  Nervous, Anxious, on Edge 3  Control/stop worrying 3  Worry too much - different things 3  Trouble relaxing 3  Restless 3  Easily annoyed or irritable 3  Afraid - awful might happen 1  Total GAD 7 Score 19  Anxiety Difficulty Somewhat difficult    Depression screen [facility] 2/9 10/26/2018 10/13/2018 09/29/2018  Decreased Interest 3 3 3   Down, Depressed, Hopeless 3 3 3   PHQ - 2 Score 6 6 6   Altered sleeping 3 3 3   Tired, decreased energy 3 3 3   Change in appetite 3 3 3   Feeling bad or failure about yourself  3 3 3   Trouble concentrating 3 3 3   Moving slowly or fidgety/restless 0 0 1  Suicidal thoughts 1 0 (No Data)  PHQ-9 Score 22 21 22   Difficult doing work/chores Extremely dIfficult  Extremely dIfficult Extremely dIfficult    Depression screen [facility] 2/9 10/26/2018 10/13/2018 09/29/2018  Decreased Interest 3 3 3   Down, Depressed, Hopeless 3 3 3   PHQ - 2 Score 6 6 6   Altered sleeping 3 3 3   Tired, decreased energy 3 3 3   Change in appetite 3 3 3   Feeling bad or failure about yourself  3 3 3   Trouble concentrating 3 3 3   Moving slowly or fidgety/restless 0 0 1  Suicidal thoughts 1 0 (No Data)  PHQ-9 Score 22 21 22   Difficult doing work/chores Extremely dIfficult Extremely dIfficult Extremely dIfficult     Assessment: 28 y.o. [id number] follow up anxiety and depression  Plan: Problem List Items Addressed This Visit    None    Visit Diagnoses    Anxiety and depression    -  Primary   Relevant Medications   escitalopram (LEXAPRO) 20 MG tablet   traZODone (DESYREL) 50 MG tablet      1) Anxiety/depression - situational component given recurrent pregnancy loss but also potential PTSD component.  Has not been contacted about follow up with psychology.  I do think she would benefit for cognitive behavioral  therapy in addition to pharmacotherapy. - REI follow up in October - Increase lexapro to 58m - Add trazodone 541mprn insomnia  2) Thyroid has been obtained previously  3) Telephone time 32:1856m 4) Return in about 2 weeks (around 11/09/2018) for Phone visit medication follow up.    AndMalachy MoodD, FACC[place]/GYN, [place] 10/26/2018, 11:45 AM

## 2018-11-09 ENCOUNTER — Other Ambulatory Visit: Payer: Self-pay

## 2018-11-09 ENCOUNTER — Other Ambulatory Visit: Payer: Self-pay | Admitting: Obstetrics and Gynecology

## 2018-11-09 ENCOUNTER — Ambulatory Visit: Payer: Medicaid Other | Admitting: Obstetrics and Gynecology

## 2018-11-09 ENCOUNTER — Telehealth: Payer: Self-pay | Admitting: Obstetrics and Gynecology

## 2018-11-09 DIAGNOSIS — N96 Recurrent pregnancy loss: Secondary | ICD-10-CM

## 2018-11-09 DIAGNOSIS — O3680X0 Pregnancy with inconclusive fetal viability, not applicable or unspecified: Secondary | ICD-10-CM

## 2018-11-09 MED ORDER — PROGESTERONE 8 % VA GEL: 1.0000 | Freq: Every day | VAGINAL | 1 refills | Status: DC

## 2018-11-09 NOTE — Telephone Encounter (Signed)
Patient is calling to speak with Dr. Georgianne Fick. She has questions. Also confirmed she has a positive home test. Please advise

## 2018-11-09 NOTE — Telephone Encounter (Signed)
Labs tomorrow 11/10/2018 and 11/14/2018

## 2018-11-10 ENCOUNTER — Telehealth: Payer: Self-pay

## 2018-11-10 ENCOUNTER — Other Ambulatory Visit: Payer: Self-pay

## 2018-11-10 ENCOUNTER — Other Ambulatory Visit: Payer: Medicaid Other

## 2018-11-10 DIAGNOSIS — N96 Recurrent pregnancy loss: Secondary | ICD-10-CM

## 2018-11-10 DIAGNOSIS — O3680X0 Pregnancy with inconclusive fetal viability, not applicable or unspecified: Secondary | ICD-10-CM

## 2018-11-10 NOTE — Telephone Encounter (Signed)
Insurance will not pay for the Progesterone Gel ($536) they will pay for Progesterone 200mg  capsule vaginally at bedtime. GoodRx prescription for 30 capsules is $30. Can Rx be changed?

## 2018-11-10 NOTE — Telephone Encounter (Signed)
Patient is schedule

## 2018-11-11 ENCOUNTER — Other Ambulatory Visit: Payer: Self-pay | Admitting: Obstetrics and Gynecology

## 2018-11-11 LAB — BETA HCG QUANT (REF LAB): hCG Quant: 2431 m[IU]/mL

## 2018-11-11 LAB — PROGESTERONE: Progesterone: 11.6 ng/mL

## 2018-11-11 MED ORDER — PROGESTERONE MICRONIZED 200 MG PO CAPS: ORAL_CAPSULE | ORAL | 3 refills | Status: DC

## 2018-11-11 NOTE — Telephone Encounter (Signed)
Yes can be changed I sent new rx

## 2018-11-14 ENCOUNTER — Other Ambulatory Visit: Payer: Self-pay

## 2018-11-14 ENCOUNTER — Other Ambulatory Visit: Payer: Medicaid Other

## 2018-11-14 DIAGNOSIS — O3680X0 Pregnancy with inconclusive fetal viability, not applicable or unspecified: Secondary | ICD-10-CM

## 2018-11-14 DIAGNOSIS — N96 Recurrent pregnancy loss: Secondary | ICD-10-CM

## 2018-11-15 LAB — BETA HCG QUANT (REF LAB): hCG Quant: 4243 m[IU]/mL

## 2018-11-16 ENCOUNTER — Encounter: Payer: Self-pay | Admitting: Obstetrics and Gynecology

## 2018-11-16 NOTE — Telephone Encounter (Signed)
Patient reschedule to 11/18/18 with AMS

## 2018-11-18 ENCOUNTER — Ambulatory Visit (INDEPENDENT_AMBULATORY_CARE_PROVIDER_SITE_OTHER): Payer: Self-pay | Admitting: Obstetrics and Gynecology

## 2018-11-18 ENCOUNTER — Telehealth: Payer: Self-pay | Admitting: Obstetrics and Gynecology

## 2018-11-18 ENCOUNTER — Encounter: Payer: Self-pay | Admitting: Obstetrics and Gynecology

## 2018-11-18 ENCOUNTER — Other Ambulatory Visit: Payer: Self-pay

## 2018-11-18 VITALS — BP 102/68 | Ht 63.0 in | Wt 152.0 lb

## 2018-11-18 DIAGNOSIS — Z3A01 Less than 8 weeks gestation of pregnancy: Secondary | ICD-10-CM

## 2018-11-18 NOTE — Telephone Encounter (Signed)
Patient recall for NOB in one week with AMS at [facility] 10/2), no appointments available until 10/13, please advise if overbook ok or wait for open appointment.

## 2018-11-18 NOTE — Telephone Encounter (Signed)
Please overbook per AMS

## 2018-11-18 NOTE — Telephone Encounter (Signed)
overbook

## 2018-11-18 NOTE — Progress Notes (Signed)
Patient presents for follow up of positive HCG in setting of history of recurrent miscarriage.  HCG have been trended, she has been started on supplemental progesterone although we discussed data has not shown outcome difference.  Bedside ultrasound today showing a viable singleton IUP with CRL 82w2dFHT 107 Follow up next week for NOB

## 2018-11-18 NOTE — Telephone Encounter (Signed)
Please advise 

## 2018-11-22 ENCOUNTER — Ambulatory Visit (INDEPENDENT_AMBULATORY_CARE_PROVIDER_SITE_OTHER): Payer: Medicaid Other | Admitting: Obstetrics and Gynecology

## 2018-11-22 ENCOUNTER — Other Ambulatory Visit: Payer: Self-pay

## 2018-11-22 ENCOUNTER — Encounter: Payer: Self-pay | Admitting: Obstetrics and Gynecology

## 2018-11-22 ENCOUNTER — Other Ambulatory Visit (HOSPITAL_COMMUNITY)
Admission: RE | Admit: 2018-11-22 | Discharge: 2018-11-22 | Disposition: A | Discharge status: Home / Self Care | Payer: Medicaid Other | Source: Ambulatory Visit | Attending: Obstetrics and Gynecology | Admitting: Obstetrics and Gynecology

## 2018-11-22 VITALS — BP 98/68 | Wt 152.0 lb

## 2018-11-22 DIAGNOSIS — Z113 Encounter for screening for infections with a predominantly sexual mode of transmission: Secondary | ICD-10-CM | POA: Insufficient documentation

## 2018-11-22 DIAGNOSIS — Z3480 Encounter for supervision of other normal pregnancy, unspecified trimester: Secondary | ICD-10-CM | POA: Diagnosis not present

## 2018-11-22 DIAGNOSIS — Z124 Encounter for screening for malignant neoplasm of cervix: Secondary | ICD-10-CM | POA: Insufficient documentation

## 2018-11-22 DIAGNOSIS — O09891 Supervision of other high risk pregnancies, first trimester: Secondary | ICD-10-CM

## 2018-11-22 DIAGNOSIS — R197 Diarrhea, unspecified: Secondary | ICD-10-CM

## 2018-11-22 DIAGNOSIS — Z3A01 Less than 8 weeks gestation of pregnancy: Secondary | ICD-10-CM | POA: Diagnosis not present

## 2018-11-22 DIAGNOSIS — Z3689 Encounter for other specified antenatal screening: Secondary | ICD-10-CM

## 2018-11-22 DIAGNOSIS — O2620 Pregnancy care for patient with recurrent pregnancy loss, unspecified trimester: Secondary | ICD-10-CM

## 2018-11-22 DIAGNOSIS — Z8619 Personal history of other infectious and parasitic diseases: Secondary | ICD-10-CM

## 2018-11-22 DIAGNOSIS — O2621 Pregnancy care for patient with recurrent pregnancy loss, first trimester: Secondary | ICD-10-CM

## 2018-11-22 NOTE — Progress Notes (Signed)
New Obstetric Patient H&P    Chief Complaint: "Desires prenatal care"   History of Present Illness: Patient is a 28 y.o. [id number] Not Hispanic or Latino female, presents with amenorrhea and positive home pregnancy test. Patient's last menstrual period was 09/26/2018 (exact date). and based on her  LMP, her EDD is Estimated Date of Delivery: 07/12/19 and her EGA is [redacted]w[redacted]d Cycles are regular monthly.    She had a urine pregnancy test which was positive 2 week(s)  ago. Her last menstrual period was normal She denies vaginal bleeding. Her past medical history is noncontributory. 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  There are cats in the home in the home  no  She admits close contact with children on a regular basis  no  She has had chicken pox in the past no She has had Tuberculosis exposures, symptoms, or previously tested positive for TB   Yes, no symptoms but husband sister previously treated for TB Current or past history of domestic violence. no  Genetic Screening/Teratology Counseling: (Includes patient, baby's father, or anyone in either family with:)   [facility] 2. Thalassemia ([place] or [place] background): MCV<80  no 3. Neural tube defect (meningomyelocele, spina bifida, anencephaly)  no 4. Congenital heart defect  no  5. Down syndrome  no 6. Tay-Sachs (Jewish, [place]  no 7. Canavan's Disease  no 8. Sickle cell disease or trait (African)  no  9. Hemophilia or other blood disorders  no  10. Muscular dystrophy  no  11. Cystic fibrosis  no  12. Huntington's Chorea  no  13. Mental retardation/autism  no 14. Other inherited genetic or chromosomal disorder  no 15. Maternal metabolic disorder (DM, PKU, etc)  no 16. Patient or FOB with a child with a birth defect not  listed above no  16a. Patient or FOB with a birth defect themselves no 17. Recurrent pregnancy loss, or stillbirth  no  18. Any medications since LMP other than prenatal vitamins (include vitamins, supplements, OTC meds, drugs, alcohol)  no 19. Any other genetic/environmental exposure to discuss  no  Infection History:   1. Lives with someone with TB or TB exposed  no  2. Patient or partner has history of genital herpes  no 3. Rash or viral illness since LMP  no 4. History of STI (GC, CT, HPV, syphilis, HIV)  no 5. History of recent travel :  no  Other pertinent information:  no     Review of Systems:10 point review of systems negative unless otherwise noted in HPI  Past Medical History:  Past Medical History:  Diagnosis Date  . 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 CD (Cro[place]'s disease) ([facility] 07/30/2014  . Crohn's disease ([facility] Jan 2016  . Depression   . Miscarriage 07/18/2014    Past Surgical History:  Past Surgical History:  Procedure Laterality Date  . DILATION AND CURETTAGE OF UTERUS    . DILATION AND EVACUATION N/A 12/02/2016   Procedure: DILATATION AND EVACUATION;  Surgeon: EHarlin Heys MD;  Location: [facility] ORS;  Service: Gynecology;  Laterality: N/A;  . DILATION AND EVACUATION N/A 06/30/2017   Procedure: DILATATION AND EVACUATION;  Surgeon: EHarlin Heys MD;  [facility];  Service: Gynecology;  Laterality: N/A;  . TONSILLECTOMY      Gynecologic History: Patient's last menstrual period was 09/26/2018 (exact date).  Obstetric History: [id number]  Family History:  Family History  Problem Relation Age of Onset  . Kidney disease Maternal Uncle   . Heart failure Maternal Uncle   . Heart failure Maternal Grandmother   . Hypertension Paternal Grandmother   . Thyroid disease Paternal Aunt     Social History:  Social History   Socioeconomic History  . Marital status: Single    Spouse name: Not on  file  . Number of children: Not on file  . Years of education: Not on file  . Highest education level: Not on file  Occupational History  . Not on file  Social Needs  . Financial resource strain: Very hard  . Food insecurity    Worry: Patient refused    Inability: Patient refused  . Transportation needs    Medical: Patient refused    Non-medical: Patient refused  Tobacco Use  . Smoking status: Current Every Day Smoker    Packs/day: 2.00    Types: Cigars  . Smokeless tobacco: Never Used  Substance and Sexual Activity  . Alcohol use: Yes    Comment: occasionally  . Drug use: No  . Sexual activity: Yes    Birth control/protection: None  Lifestyle  . Physical activity    Days per week: Patient refused    Minutes per session: Patient refused  . Stress: Only a little  Relationships  . Social Herbalist on phone: Patient refused    Gets together: Patient refused    Attends religious service: Patient refused    Active member of club or organization: Patient refused    Attends meetings of clubs or organizations: Patient refused    Relationship status: Patient refused  . Intimate partner violence    Fear of current or ex partner: Patient refused    Emotionally abused: Patient refused    Physically abused: Patient refused    Forced sexual activity: Patient refused  Other Topics Concern  . Not on file  Social History Narrative  . Not on file    Allergies:  Allergies  Allergen Reactions  . Aspirin Other (See Comments)    Chron's Disease  . Ibuprofen Other (See Comments)    Chron's Disease  . Amoxicillin Rash and Other (See Comments)    Has patient had a PCN reaction causing immediate rash, facial/tongue/throat swelling, SOB or lightheadedness with hypotension: Unknown Has patient had a PCN reaction causing severe rash involving mucus membranes or skin necrosis: Unknown Has patient had a PCN reaction that required hospitalization: Unknown Has patient had a PCN  reaction occurring within the last 10 years: No If all of the above answers are "NO", then may proceed with Cephalosporin use.     Medications: Prior to Admission medications   Medication Sig Start Date End Date Taking? Authorizing Provider  acetaminophen (TYLENOL) 500 MG tablet Take by mouth.   Yes [provider]  escitalopram (LEXAPRO) 20 MG tablet Take 1 tablet (20 mg total) by mouth daily. 10/26/18  Yes [name], MD  progesterone (PROMETRIUM) 200 MG capsule Place one capsule vaginally at bedtime 11/11/18  Yes [name], MD  traZODone (DESYREL) 50 MG tablet Take 1 tablet (50 mg total) by mouth at bedtime. 10/26/18  Yes [name], MD    Physical Exam Vitals: Blood pressure 98/68, weight 152 lb (68.9 kg), last menstrual  period 09/26/2018.  General: NAD HEENT: normocephalic, anicteric Pulmonary: No increased work of breathing,  Genitourinary:  External: Normal external female genitalia.  Normal urethral meatus, normal Bartholin's and Skene's glands.    Vagina: Normal vaginal mucosa, no evidence of prolapse.    Cervix: Grossly normal in appearance, no bleeding  Uterus:  Non-enlarged, mobile, normal contour.  No CMT  Adnexa: ovaries non-enlarged, no adnexal masses  Rectal: deferred Extremities: no edema, erythema, or tenderness Neurologic: Grossly intact Psychiatric: mood appropriate, affect full  Bedside US viable singleton IUP  FHT 110, CRL 13w6d4cm fundal fibroid  Assessment: 28y.o. [id number] 676w6dresenting to initiate prenatal care  Plan: 1) Avoid alcoholic beverages. 2) Patient encouraged not to smoke.  3) Discontinue the use of all non-medicinal drugs and chemicals.  4) Take prenatal vitamins daily.  5) Nutrition, food safety (fish, cheese advisories, and high nitrite foods) and exercise discussed. 6) Hospital and practice style discussed with cross coverage system.  7) Genetic Screening, such as with 1st Trimester Screening, cell free  fetal DNA, AFP testing, and Ultrasound, as well as with amniocentesis and CVS as appropriate, is discussed with patient. 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  Will set up C. Dif antigen testing.  AnMalachy MoodMD, FALoura Pardon[place]/GYN, [place]

## 2018-11-22 NOTE — Progress Notes (Signed)
NOB 

## 2018-11-23 LAB — RPR+RH+ABO+RUB AB+AB SCR+CB...
Antibody Screen: NEGATIVE
HIV Screen 4th Generation wRfx: NONREACTIVE
Hematocrit: 39.6 % (ref 34.0–46.6)
Hemoglobin: 13.5 g/dL (ref 11.1–15.9)
Hepatitis B Surface Ag: NEGATIVE
MCH: 31.1 pg (ref 26.6–33.0)
MCHC: 34.1 g/dL (ref 31.5–35.7)
MCV: 91 fL (ref 79–97)
Platelets: 267 10*3/uL (ref 150–450)
RBC: 4.34 x10E6/uL (ref 3.77–5.28)
RDW: 13.3 % (ref 11.7–15.4)
RPR Ser Ql: NONREACTIVE
Rh Factor: POSITIVE
Rubella Antibodies, IGG: 2.78 index (ref >0.99)
Varicella zoster IgG: 878 index (ref >165)
WBC: 13.4 10*3/uL — ABNORMAL HIGH (ref 3.4–10.8)

## 2018-11-23 LAB — HEMOGLOBINOPATHY EVALUATION
HGB C: 0 %
HGB S: 0 %
HGB VARIANT: 0 %
Hemoglobin A2 Quantitation: 2.2 % (ref 1.8–3.2)
Hemoglobin F Quantitation: 0 % (ref 0.0–2.0)
Hgb A: 97.8 % (ref 96.4–98.8)

## 2018-11-24 ENCOUNTER — Encounter: Payer: Self-pay | Admitting: Obstetrics and Gynecology

## 2018-11-24 DIAGNOSIS — O0990 Supervision of high risk pregnancy, unspecified, unspecified trimester: Secondary | ICD-10-CM | POA: Insufficient documentation

## 2018-11-24 LAB — URINE CULTURE: Organism ID, Bacteria: NO GROWTH

## 2018-11-25 ENCOUNTER — Encounter: Payer: Medicaid Other | Admitting: Obstetrics and Gynecology

## 2018-11-25 LAB — CYTOLOGY - PAP
Chlamydia: NEGATIVE
Diagnosis: NEGATIVE
Neisseria Gonorrhea: NEGATIVE

## 2018-12-01 ENCOUNTER — Ambulatory Visit (INDEPENDENT_AMBULATORY_CARE_PROVIDER_SITE_OTHER): Payer: Medicaid Other | Admitting: Obstetrics and Gynecology

## 2018-12-01 ENCOUNTER — Telehealth: Payer: Self-pay | Admitting: Obstetrics and Gynecology

## 2018-12-01 ENCOUNTER — Other Ambulatory Visit: Payer: Self-pay

## 2018-12-01 ENCOUNTER — Ambulatory Visit (INDEPENDENT_AMBULATORY_CARE_PROVIDER_SITE_OTHER): Payer: Medicaid Other

## 2018-12-01 VITALS — BP 104/60 | Wt 151.0 lb

## 2018-12-01 DIAGNOSIS — Z3A01 Less than 8 weeks gestation of pregnancy: Secondary | ICD-10-CM | POA: Diagnosis not present

## 2018-12-01 DIAGNOSIS — O2620 Pregnancy care for patient with recurrent pregnancy loss, unspecified trimester: Secondary | ICD-10-CM

## 2018-12-01 DIAGNOSIS — Z362 Encounter for other antenatal screening follow-up: Secondary | ICD-10-CM

## 2018-12-01 DIAGNOSIS — O021 Missed abortion: Secondary | ICD-10-CM | POA: Diagnosis not present

## 2018-12-01 DIAGNOSIS — Z3689 Encounter for other specified antenatal screening: Secondary | ICD-10-CM

## 2018-12-01 NOTE — Telephone Encounter (Signed)
Patient is aware per Pre-admit, no phone interview, arrive 2 hrs prior to surgery (10am at the [facility]). I contacted Pre-admit testing regarding the patient's medications, spoke to Joellen Jersey, who asked nurse and the patient cannot take progesterone and trazadone, tylenol and lexapro are okay. Patient said she usually takes trazadone and lexapro at night. Called Pre-admit again with the patient on hold, spoke to [facility], who said she can take trazadone and lexapro Monday night, just not the progesterone Tuesday morning. Per patient, she does not take the progesterone anymore, and doesn't take any medications in the morning.

## 2018-12-01 NOTE — Progress Notes (Signed)
Dating san today. No vb. No lof

## 2018-12-01 NOTE — Telephone Encounter (Signed)
-----   Message from [name], MD sent at 12/01/2018 10:40 AM EDT ----- Regarding: Surgery Surgery Date: 12/05/2011 or 12/06/2011  LOS: same day surgery  Surgery Booking Request Patient Full Name: Kathleen Owen MRN: [id number]  DOB: [birth year]  Surgeon: [name], MD  Requested Surgery Date and Time: Noon if 12/05/2011 Primary Diagnosis and Code: Missed abortion Secondary Diagnosis and Code:  Surgical Procedure: Suction D&C L&D Notification:N/A Admission Status: same day surgery Length of Surgery: 1hr Special Case Needs: none H&P: day of surgery (date) Phone Interview or Office Pre-Admit: pre-admit Interpreter: No Language: English Medical Clearance: No Special Scheduling Instructions: orders are in and held for COVID and preprocedure

## 2018-12-01 NOTE — Telephone Encounter (Signed)
Patient is aware of COVID testing on 12/02/18 @ 8-10:30am [facility], Pre-admit testing phone interview to be scheduled (patient will watch for MyChart notification), and OR on 12/06/18. Patient is aware she will be asked to quarantine after COVID testing. Patient is aware of noon procedure and that the hospital will give her an arrival time. Patient is aware she may receive calls from the [place] and [facility]. Patient confirmed Medicaid FPW and has applied for Pregnancy Medicaid. Patient will let the [facility] know when she receives the call.

## 2018-12-02 ENCOUNTER — Other Ambulatory Visit
Admission: RE | Admit: 2018-12-02 | Discharge: 2018-12-02 | Disposition: A | Discharge status: Home / Self Care | Payer: Medicaid Other | Source: Ambulatory Visit | Attending: Obstetrics and Gynecology | Admitting: Obstetrics and Gynecology

## 2018-12-02 DIAGNOSIS — Z20828 Contact with and (suspected) exposure to other viral communicable diseases: Secondary | ICD-10-CM | POA: Diagnosis not present

## 2018-12-02 DIAGNOSIS — Z01812 Encounter for preprocedural laboratory examination: Secondary | ICD-10-CM | POA: Insufficient documentation

## 2018-12-02 LAB — SARS CORONAVIRUS 2 (TAT 6-24 HRS): SARS Coronavirus 2: NEGATIVE

## 2018-12-02 LAB — TYPE AND SCREEN
ABO/RH(D): A POS
Antibody Screen: NEGATIVE
Extend sample reason: UNDETERMINED

## 2018-12-05 MED ORDER — DOXYCYCLINE HYCLATE 100 MG IV SOLR
200.0000 mg | INTRAVENOUS | Status: AC
  Administered 2018-12-06: 12:00:00 200 mg via INTRAVENOUS
  Filled 2018-12-05: qty 200

## 2018-12-05 NOTE — Progress Notes (Signed)
Obstetrics & Gynecology Surgery H&P    Chief Complaint: Scheduled Surgery   History of Present Illness: Patient is a 28 y.o. [id number] presenting for scheduled suction D&C, for the treatment or further evaluation of missed abotion.   Ultrasound today demonstrates a missed abortion.  Bedside ultrasound last two week demonstrated viable IUP with FHT.  The patient has a history of recurrent pregnancy loss.  Was managed with vaginal progesterone during the first trimester this pregnancy.  She has not noted any cramping or bleeding.   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   Bacterial vaginosis August 2016   recurrent BV   CD (Crohn's disease) ([place]) 07/30/2014   Crohn's disease ([place]) Jan 2016   Depression    Miscarriage 07/18/2014    Past Surgical History:  Past Surgical History:  Procedure Laterality Date   DILATION AND CURETTAGE OF UTERUS     DILATION AND EVACUATION N/A 12/02/2016   Procedure: DILATATION AND EVACUATION;  Surgeon: Harlin Heys, MD;  Location: [facility] ORS;  Service: Gynecology;  Laterality: N/A;   DILATION AND EVACUATION N/A 06/30/2017   Procedure: DILATATION AND EVACUATION;  Surgeon: Harlin Heys, MD;  Location: [facility] ORS;  Service: Gynecology;  Laterality: N/A;   TONSILLECTOMY      Family History:  Family History  Problem Relation Age of Onset   Kidney disease Maternal Uncle    Heart failure Maternal Uncle    Heart failure Maternal Grandmother    Hypertension Paternal Grandmother    Thyroid disease Paternal Aunt     Social History:  Social History   Socioeconomic History   Marital status: Single    Spouse name: Not on file   Number of children: Not on file   Years of education: Not on file   Highest education level: Not on file  Occupational History    Not on file  Social Needs   Financial resource strain: Very hard   Food insecurity    Worry: Patient refused    Inability: Patient refused   Transportation needs    Medical: Patient refused    Non-medical: Patient refused  Tobacco Use   Smoking status: Current Every Day Smoker    Packs/day: 2.00    Types: Cigars   Smokeless tobacco: Never Used  Substance and Sexual Activity   Alcohol use: Yes    Comment: occasionally   Drug use: No   Sexual activity: Yes    Birth control/protection: None  Lifestyle   Physical activity    Days per week: Patient refused    Minutes per session: Patient refused   Stress: Only a little  Relationships   Press photographer on phone: Patient refused    Gets together: Patient refused    Attends religious service: Patient refused    Active member of club or organization: Patient refused    Attends meetings of clubs or organizations: Patient refused    Relationship status: Patient refused   Intimate partner violence    Fear of current or ex partner: Patient refused    Emotionally abused: Patient refused    Physically abused: Patient refused    Forced sexual activity: Patient refused  Other Topics Concern   Not on file  Social History Narrative   Not on file    Allergies:  Allergies  Allergen Reactions   Aspirin  Other (See Comments)    Chron's Disease   Ibuprofen Other (See Comments)    Chron's Disease   Amoxicillin Rash and Other (See Comments)    Has patient had a PCN reaction causing immediate rash, facial/tongue/throat swelling, SOB or lightheadedness with hypotension: Unknown Has patient had a PCN reaction causing severe rash involving mucus membranes or skin necrosis: Unknown Has patient had a PCN reaction that required hospitalization: Unknown Has patient had a PCN reaction occurring within the last 10 years: No If all of the above answers are "NO", then may proceed with Cephalosporin use.      Medications: Prior to Admission medications   Medication Sig Start Date End Date Taking? Authorizing Provider  acetaminophen (TYLENOL) 500 MG tablet Take 500 mg by mouth every 4 (four) hours as needed for mild pain or headache.     [provider]  escitalopram (LEXAPRO) 20 MG tablet Take 1 tablet (20 mg total) by mouth daily. Patient taking differently: Take 20 mg by mouth at bedtime.  10/26/18   [name], MD  progesterone (PROMETRIUM) 200 MG capsule Place one capsule vaginally at bedtime Patient taking differently: Take 200 mg by mouth at bedtime. Place one capsule vaginally at bedtime 11/11/18   [name], MD  traZODone (DESYREL) 50 MG tablet Take 1 tablet (50 mg total) by mouth at bedtime. 10/26/18   [name], MD    Physical Exam Vitals: Blood pressure 104/60, weight 151 lb (68.5 kg), last menstrual period 09/26/2018. General: NAD HEENT: normocephalic, anicteric Pulmonary: No increased work of breathing Cardiovascular: RRR, distal pulses 2+ Extremities: no edema, erythema, or tenderness Neurologic: Grossly intact Psychiatric: mood appropriate, affect full  Imaging US Ob Follow Up  Result Date: 12/01/2018 Patient Name: Kathleen Owen DOB: 1991-01-22 MRN: [id number] ULTRASOUND REPORT Location: [facility] Date of Service: 12/01/2018 Indications:dating Findings: Nelda Marse[place] intrauterine pregnancy is visualized with a CRL consistent with 63w6dgestation, giving an (U/S) EDD of 07/14/2019. The (U/S) EDD is not consistent with the clinically established EDD of 07/03/2019. No fetal heartbeat is seen today. CRL measurement: 15.2 mm Yolk sac is visualized and appears normal and early anatomy is normal. Amnion: visualized and appears normal Right Ovary is normal in appearance. Left Ovary is normal appearance. Corpus luteal cyst:  Right ovary Survey of the adnexa demonstrates no adnexal masses. There is no free peritoneal fluid in the cul de sac. Impression: 1.  7102w6diable Singleton Intrauterine pregnancy by U/S. 2. (U/S) EDD is not consistent with Clinically established EDD of 07/03/2019. Recommendations: 1.Clinical correlation with the patient's History and Physical Exam. ElGweneth DimitriRT There is a non-viable singleton gestation. AnMalachy MoodMD, FADaltonB/GYN, [place] 12/01/2018, 11:27 AM    Assessment: [age].o. [id number] for scheduled suction D&C  Plan: 1)  Condolences were offered to the patient and her family.  I stressed that while emotionally difficult, that this did not occur because of an actions or inactions by the patient.  Somewhere between 10-20% of identified first trimester pregnancies will unfortunately end in miscarriage.  Given this relatively high incidence rate, further diagnostic testing such as chromosome analysis is generally not clinically relevant nor recommended.  Although the chromosomal abnormalities have been implicated at rates as high as 70% in some studies, these are generally random and do not infer and increased risk of recurrence with subsequent pregnancies.  However, 3 or more consecutive first trimester losses are relatively uncommon, and these patient generally do benefit from additional work  up to determine a potential modifiable etiology.   We briefly discussed management options including expectant management, medical management, and surgical management as well as their relative success rates and complications. Approximately 80% of first trimester miscarriages will pass successfully but may require a time frame of up to 8 weeks ([place] 150 May 2015 "Early Pregnancy Loss").  Medical management using 886mg of misoprostil administered every 3-hrs as needed for up to 3 doses speeds up the time frame to completion significantly, has literature supporting its use up to 63 days or 95w0destation and results in a passage rate of 84-85% (ACOG Practice Bulletin 143 March  2014 "Medical Management of First-Trimester Abortion").  Dilation and curettage has the highest rate of uterine evacuation, but carries with is operative cost, surgical and anesthetic risk.  While these risk are relatively small they nevertheless include infection, bleeding, uterine perforation, formation of uterine synechia, and in rare cases death.   We discussed repeat ultrasound and or trending HCG levels if the patient wishes to pursue these prior to making her decision.  Clinically I am confident of the diagnosis, but I do not want any doubts in the patient's mind regarding the plan of management she chooses to adopt.  I will allow the patient and her family to discuss management options and she was advised to contact the office to arrange final disposition one she has made her decision or should she have any follow up questions for myself.    The patient is Rh positive and rhogam is therefore not indicated.    Patient opts to proceed with surgical management.    2) Routine postoperative instructions were reviewed with the patient and her family in detail today including the expected length of recovery and likely postoperative course.  The patient concurred with the proposed plan, giving informed written consent for the surgery today.  Patient instructed on the importance of being NPO after midnight prior to her procedure.  If warranted preoperative prophylactic antibiotics and SCDs ordered on call to the OR to meet SCIP guidelines and adhere to recommendation laid forth in ACA[place]umber 104 May 2009  "Antibiotic Prophylaxis for Gynecologic Procedures".     AnMalachy MoodMD, FALoura PardonB/GYN, [place]

## 2018-12-06 ENCOUNTER — Ambulatory Visit
Admission: EM | Admit: 2018-12-06 | Discharge: 2018-12-06 | Disposition: A | Discharge status: Home / Self Care | Payer: Medicaid Other | Attending: Obstetrics and Gynecology | Admitting: Obstetrics and Gynecology

## 2018-12-06 ENCOUNTER — Ambulatory Visit: Payer: Medicaid Other | Admitting: Certified Registered Nurse Anesthetist

## 2018-12-06 ENCOUNTER — Other Ambulatory Visit: Payer: Self-pay

## 2018-12-06 ENCOUNTER — Encounter
Admission: EM | Disposition: A | Discharge status: Home / Self Care | Payer: Self-pay | Source: Home / Self Care | Attending: Obstetrics and Gynecology

## 2018-12-06 ENCOUNTER — Encounter: Payer: Self-pay | Admitting: *Deleted

## 2018-12-06 DIAGNOSIS — Z3A01 Less than 8 weeks gestation of pregnancy: Secondary | ICD-10-CM | POA: Insufficient documentation

## 2018-12-06 DIAGNOSIS — F329 Major depressive disorder, single episode, unspecified: Secondary | ICD-10-CM | POA: Diagnosis not present

## 2018-12-06 DIAGNOSIS — K5090 Crohn's disease, unspecified, without complications: Secondary | ICD-10-CM | POA: Diagnosis not present

## 2018-12-06 DIAGNOSIS — Z880 Allergy status to penicillin: Secondary | ICD-10-CM | POA: Diagnosis not present

## 2018-12-06 DIAGNOSIS — O99331 Smoking (tobacco) complicating pregnancy, first trimester: Secondary | ICD-10-CM | POA: Insufficient documentation

## 2018-12-06 DIAGNOSIS — Z886 Allergy status to analgesic agent status: Secondary | ICD-10-CM | POA: Insufficient documentation

## 2018-12-06 DIAGNOSIS — F17290 Nicotine dependence, other tobacco product, uncomplicated: Secondary | ICD-10-CM | POA: Diagnosis not present

## 2018-12-06 DIAGNOSIS — Z79899 Other long term (current) drug therapy: Secondary | ICD-10-CM | POA: Diagnosis not present

## 2018-12-06 DIAGNOSIS — O99611 Diseases of the digestive system complicating pregnancy, first trimester: Secondary | ICD-10-CM | POA: Insufficient documentation

## 2018-12-06 DIAGNOSIS — Z9889 Other specified postprocedural states: Secondary | ICD-10-CM

## 2018-12-06 DIAGNOSIS — O021 Missed abortion: Secondary | ICD-10-CM | POA: Insufficient documentation

## 2018-12-06 DIAGNOSIS — O99341 Other mental disorders complicating pregnancy, first trimester: Secondary | ICD-10-CM | POA: Diagnosis not present

## 2018-12-06 HISTORY — PX: DILATION AND EVACUATION: SHX1459

## 2018-12-06 LAB — TYPE AND SCREEN
ABO/RH(D): A POS
Antibody Screen: NEGATIVE

## 2018-12-06 SURGERY — DILATION AND EVACUATION, UTERUS
Anesthesia: General

## 2018-12-06 MED ORDER — MIDAZOLAM HCL 2 MG/2ML IJ SOLN
INTRAMUSCULAR | Status: AC
  Filled 2018-12-06: qty 2

## 2018-12-06 MED ORDER — ONDANSETRON HCL 4 MG/2ML IJ SOLN
INTRAMUSCULAR | Status: AC
  Administered 2018-12-06: 4 mg via INTRAVENOUS
  Filled 2018-12-06: qty 2

## 2018-12-06 MED ORDER — OXYCODONE HCL 5 MG PO TABS
5.0000 mg | ORAL_TABLET | Freq: Once | ORAL | Status: AC
  Administered 2018-12-06: 5 mg via ORAL

## 2018-12-06 MED ORDER — LIDOCAINE HCL (CARDIAC) PF 100 MG/5ML IV SOSY
PREFILLED_SYRINGE | INTRAVENOUS | Status: DC | PRN
  Administered 2018-12-06: 80 mg via INTRAVENOUS

## 2018-12-06 MED ORDER — FENTANYL CITRATE (PF) 100 MCG/2ML IJ SOLN
25.0000 ug | INTRAMUSCULAR | Status: AC | PRN
  Administered 2018-12-06 (×8): 25 ug via INTRAVENOUS

## 2018-12-06 MED ORDER — LACTATED RINGERS IV SOLN
INTRAVENOUS | Status: DC
  Administered 2018-12-06: 11:00:00 via INTRAVENOUS

## 2018-12-06 MED ORDER — OXYCODONE HCL 5 MG PO TABS
ORAL_TABLET | ORAL | Status: AC
  Filled 2018-12-06: qty 1

## 2018-12-06 MED ORDER — LIDOCAINE HCL (PF) 2 % IJ SOLN
INTRAMUSCULAR | Status: AC
  Filled 2018-12-06: qty 10

## 2018-12-06 MED ORDER — OXYCODONE HCL 5 MG PO TABS
5.0000 mg | ORAL_TABLET | Freq: Once | ORAL | Status: AC | PRN
  Administered 2018-12-06: 14:00:00 5 mg via ORAL

## 2018-12-06 MED ORDER — FENTANYL CITRATE (PF) 100 MCG/2ML IJ SOLN
INTRAMUSCULAR | Status: AC
  Administered 2018-12-06: 25 ug via INTRAVENOUS
  Filled 2018-12-06: qty 2

## 2018-12-06 MED ORDER — ONDANSETRON HCL 4 MG/2ML IJ SOLN
4.0000 mg | Freq: Once | INTRAMUSCULAR | Status: AC
  Administered 2018-12-06: 11:00:00 4 mg via INTRAVENOUS

## 2018-12-06 MED ORDER — PROPOFOL 10 MG/ML IV BOLUS
INTRAVENOUS | Status: AC
  Filled 2018-12-06: qty 20

## 2018-12-06 MED ORDER — OXYCODONE HCL 5 MG/5ML PO SOLN: 5.0000 mg | Freq: Once | ORAL | Status: AC | PRN

## 2018-12-06 MED ORDER — ONDANSETRON HCL 4 MG/2ML IJ SOLN
INTRAMUSCULAR | Status: AC
  Filled 2018-12-06: qty 2

## 2018-12-06 MED ORDER — OXYCODONE HCL 5 MG PO TABS
ORAL_TABLET | ORAL | Status: AC
  Administered 2018-12-06: 5 mg via ORAL
  Filled 2018-12-06: qty 1

## 2018-12-06 MED ORDER — DEXAMETHASONE SODIUM PHOSPHATE 10 MG/ML IJ SOLN
INTRAMUSCULAR | Status: AC
  Filled 2018-12-06: qty 1

## 2018-12-06 MED ORDER — PHENYLEPHRINE HCL (PRESSORS) 10 MG/ML IV SOLN
INTRAVENOUS | Status: DC | PRN
  Administered 2018-12-06 (×2): 100 ug via INTRAVENOUS

## 2018-12-06 MED ORDER — MIDAZOLAM HCL 2 MG/2ML IJ SOLN
INTRAMUSCULAR | Status: DC | PRN
  Administered 2018-12-06: 2 mg via INTRAVENOUS

## 2018-12-06 MED ORDER — FENTANYL CITRATE (PF) 100 MCG/2ML IJ SOLN
INTRAMUSCULAR | Status: DC | PRN
  Administered 2018-12-06 (×4): 25 ug via INTRAVENOUS

## 2018-12-06 MED ORDER — FENTANYL CITRATE (PF) 100 MCG/2ML IJ SOLN
INTRAMUSCULAR | Status: AC
  Filled 2018-12-06: qty 2

## 2018-12-06 MED ORDER — PROPOFOL 10 MG/ML IV BOLUS
INTRAVENOUS | Status: DC | PRN
  Administered 2018-12-06: 150 mg via INTRAVENOUS

## 2018-12-06 MED ORDER — HYDROCODONE-ACETAMINOPHEN 5-325 MG PO TABS: 1.0000 | ORAL_TABLET | Freq: Four times a day (QID) | ORAL | 0 refills | Status: DC | PRN

## 2018-12-06 MED ORDER — DEXAMETHASONE SODIUM PHOSPHATE 10 MG/ML IJ SOLN
INTRAMUSCULAR | Status: DC | PRN
  Administered 2018-12-06: 10 mg via INTRAVENOUS

## 2018-12-06 SURGICAL SUPPLY — 19 items
CATH ROBINSON RED A/P 16FR (CATHETERS) ×2 IMPLANT
COVER WAND RF STERILE (DRAPES) ×2 IMPLANT
FILTER UTR ASPR SPEC (MISCELLANEOUS) ×1 IMPLANT
FLTR UTR ASPR SPEC (MISCELLANEOUS) ×2
GLOVE BIO SURGEON STRL SZ7 (GLOVE) ×2 IMPLANT
GOWN STRL REUS W/ TWL LRG LVL3 (GOWN DISPOSABLE) ×2 IMPLANT
GOWN STRL REUS W/TWL LRG LVL3 (GOWN DISPOSABLE) ×2
KIT BERKELEY 1ST TRIMESTER 3/8 (MISCELLANEOUS) ×2 IMPLANT
KIT TURNOVER CYSTO (KITS) ×2 IMPLANT
NS IRRIG 500ML POUR BTL (IV SOLUTION) ×2 IMPLANT
PACK DNC HYST (MISCELLANEOUS) ×2 IMPLANT
PAD OB MATERNITY 4.3X12.25 (PERSONAL CARE ITEMS) ×2 IMPLANT
PAD PREP 24X41 OB/GYN DISP (PERSONAL CARE ITEMS) ×2 IMPLANT
SET BERKELEY SUCTION TUBING (SUCTIONS) ×2 IMPLANT
TOWEL OR 17X26 4PK STRL BLUE (TOWEL DISPOSABLE) ×2 IMPLANT
VACURETTE 10 RIGID CVD (CANNULA) ×2 IMPLANT
VACURETTE 12 RIGID CVD (CANNULA) ×2 IMPLANT
VACURETTE 8 RIGID CVD (CANNULA) ×2 IMPLANT
VACURETTE 8MM F TIP (MISCELLANEOUS) ×2 IMPLANT

## 2018-12-06 NOTE — Transfer of Care (Signed)
Immediate Anesthesia Transfer of Care Note  Patient: Kathleen Owen  Procedure(s) Performed: SUCTION D&C (N/A )  Patient Location: PACU  Anesthesia Type:General  Level of Consciousness: sedated  Airway & Oxygen Therapy: Patient Spontanous Breathing and Patient connected to face mask oxygen  Post-op Assessment: Report given to RN and Post -op Vital signs reviewed and stable  Post vital signs: Reviewed and stable  Last Vitals:  Vitals Value Taken Time  BP 98/55 12/06/18 1232  Temp    Pulse 82 12/06/18 1234  Resp 17 12/06/18 1234  SpO2 98 % 12/06/18 1234  Vitals shown include unvalidated device data.  Last Pain:  Vitals:   12/06/18 1022  TempSrc: Temporal  PainSc: 5          Complications: No apparent anesthesia complications

## 2018-12-06 NOTE — Anesthesia Preprocedure Evaluation (Signed)
Anesthesia Evaluation  Patient identified by MRN, date of birth, ID band Patient awake    Reviewed: Allergy & Precautions, H&P , NPO status , Patient's Chart, lab work & pertinent test results  History of Anesthesia Complications Negative for: history of anesthetic complications  Airway Mallampati: III  TM Distance: >3 FB Neck ROM: full    Dental  (+) Chipped, Poor Dentition   Pulmonary neg shortness of breath, asthma , Current Smoker and Patient abstained from smoking.,           Cardiovascular Exercise Tolerance: Good (-) angina(-) Past MI and (-) DOE negative cardio ROS       Neuro/Psych PSYCHIATRIC DISORDERS negative neurological ROS  negative psych ROS   GI/Hepatic negative GI ROS, Neg liver ROS, neg GERD  ,  Endo/Other  negative endocrine ROS  Renal/GU      Musculoskeletal   Abdominal   Peds  Hematology negative hematology ROS (+)   Anesthesia Other Findings Past Medical History: 07/30/2014: Airway hyperreactivity August 2016: Bacterial vaginosis     Comment:  recurrent BV 07/30/2014: CD (Crohn's disease) ([place]) Jan 2016: Crohn's disease ([place]) No date: Depression 07/18/2014: Miscarriage  Past Surgical History: No date: DILATION AND CURETTAGE OF UTERUS 12/02/2016: DILATION AND EVACUATION; N/A     Comment:  Procedure: DILATATION AND EVACUATION;  Surgeon: Harlin Heys, MD;  Location: [facility] OR[facility];  Service:               Gynecology;  Laterality: N/A; 06/30/2017: DILATION AND EVACUATION; N/A     Comment:  Procedure: DILATATION AND EVACUATION;  Surgeon: Harlin Heys, MD;  Location: [facility] OR[facility];  Service:               Gynecology;  Laterality: N/A; No date: TONSILLECTOMY  BMI    Body Mass Index: 26.56 kg/m      Reproductive/Obstetrics negative OB ROS                             Anesthesia Physical Anesthesia Plan  ASA: III  Anesthesia Plan:  General LMA   Post-op Pain Management:    Induction: Intravenous  PONV Risk Score and Plan: Dexamethasone, Ondansetron, Midazolam and Treatment may vary due to age or medical condition  Airway Management Planned: LMA  Additional Equipment:   Intra-op Plan:   Post-operative Plan: Extubation in OR  Informed Consent: I have reviewed the patients History and Physical, chart, labs and discussed the procedure including the risks, benefits and alternatives for the proposed anesthesia with the patient or authorized representative who has indicated his/her understanding and acceptance.     Dental Advisory Given  Plan Discussed with: Anesthesiologist, CRNA and Surgeon  Anesthesia Plan Comments: (Patient consented for risks of anesthesia including but not limited to:  - adverse reactions to medications - damage to teeth, lips or other oral mucosa - sore throat or hoarseness - Damage to heart, brain, lungs or loss of life  Patient voiced understanding.)        Anesthesia Quick Evaluation

## 2018-12-06 NOTE — Discharge Instructions (Signed)

## 2018-12-06 NOTE — H&P (Signed)
Date of Initial H&P: 12/01/2018  History reviewed, patient examined, no change in status, stable for surgery.

## 2018-12-06 NOTE — Op Note (Signed)
Preoperative Diagnosis: 1) 28 y.o. [id number] with 7 week missed abortion  Postoperative Diagnosis: 1) 28 y.o. [id number] with 7 week missed abortion*  Operation Performed: Suction dilation and curettage  Indication: Missed abortion, after discussion of expectant, medical, or surgical management patient opted to proceed with surgical management.  Anesthesia: General  Primary Surgeon: [name], MD  Assistant: none  Preoperative Antibiotics: none  Estimated Blood Loss: 30 mL  IV Fluids: 859m  Urine Output:: ~36mstraight cath  Drains or Tubes: none  Implants: none  Specimens Removed: Products of conception - to [facility] for SNP micro array   Complications: none  Intraoperative Findings: Normal appearing cervix, cervical os closed, no bleeding.  Uterus anteverted 10 week size  Patient Condition: stable  Procedure in Detail:  Patient was taken to the operating room were she was administered general endotracheal anesthesia.  She was positioned in the dorsal lithotomy position utilizing Allen stirups, prepped and draped in the usual sterile fashion.  Uterus was noted to be 10 weeks in size, anteverted.   Prior to proceeding with the case a time out was performed.  Attention was turned to the patient's pelvis.  A red rubber catheter was used to empty the patient's bladder.  An operative speculum was placed to allow visualization of the cervix.  The anterior lip of the cervix was grasped with a single tooth tenaculum and the cervix was sequentially dilated using pratt dilators.  A size 8 flexible suction curette was then advanced to the uterine fundus.  Several passes were undertaken to clear the uterine contents.  Sharp curettage was then  performed nothing good uterine cry throughout the cavity.  A final pass of the suction curette was then undertaken.  The resulting specimen was sent to pathology.    The single tooth tenaculum was removed from the cervix.  The tenaculum sites  and cervix were noted to be  Hemostatic before removing the operative speculum.  Sponge needle and instrument counts were corrects times two.  The patient tolerated the procedure well and was taken to the recovery room in stable condition.

## 2018-12-06 NOTE — Anesthesia Postprocedure Evaluation (Signed)
Anesthesia Post Note  Patient: Kathleen Owen  Procedure(s) Performed: SUCTION D&C (N/A )  Patient location during evaluation: PACU Anesthesia Type: General Level of consciousness: awake and alert Pain management: pain level controlled Vital Signs Assessment: post-procedure vital signs reviewed and stable Respiratory status: spontaneous breathing, nonlabored ventilation, respiratory function stable and patient connected to nasal cannula oxygen Cardiovascular status: blood pressure returned to baseline and stable Postop Assessment: no apparent nausea or vomiting Anesthetic complications: no     Last Vitals:  Vitals:   12/06/18 1434 12/06/18 1450  BP: (!) 95/57 107/69  Pulse: 93 98  Resp:  16  Temp:  (!) 36.1 C  SpO2: 98% 98%    Last Pain:  Vitals:   12/06/18 1450  TempSrc: Temporal  PainSc: 6                  Precious Haws Codee Tutson

## 2018-12-06 NOTE — Anesthesia Procedure Notes (Signed)
Procedure Name: LMA Insertion Date/Time: 12/06/2018 12:03 PM Performed by: Caryl Asp, CRNA Pre-anesthesia Checklist: Patient identified, Patient being monitored, Timeout performed, Emergency Drugs available and Suction available Patient Re-evaluated:Patient Re-evaluated prior to induction Oxygen Delivery Method: Circle system utilized Preoxygenation: Pre-oxygenation with 100% oxygen Induction Type: IV induction Ventilation: Mask ventilation without difficulty LMA: LMA inserted LMA Size: 4.0 Tube type: Oral Number of attempts: 1 Placement Confirmation: positive ETCO2 and breath sounds checked- equal and bilateral Tube secured with: Tape Dental Injury: Teeth and Oropharynx as per pre-operative assessment

## 2018-12-06 NOTE — OR Nursing (Signed)
Per surgeon order entire specimen of POC was sent to [facility] for genetic testing

## 2018-12-06 NOTE — Anesthesia Post-op Follow-up Note (Signed)
Anesthesia QCDR form completed.        

## 2018-12-07 ENCOUNTER — Encounter: Payer: Self-pay | Admitting: Obstetrics and Gynecology

## 2018-12-07 LAB — CLOSTRIDIUM DIFFICILE EIA

## 2018-12-12 NOTE — Progress Notes (Signed)
cancelled

## 2018-12-13 ENCOUNTER — Ambulatory Visit: Payer: Medicaid Other | Admitting: Obstetrics and Gynecology

## 2018-12-22 LAB — POC/TISSUE MICROARRAY

## 2018-12-26 ENCOUNTER — Emergency Department: Payer: Medicaid Other

## 2018-12-26 ENCOUNTER — Encounter: Payer: Self-pay | Admitting: Emergency Medicine

## 2018-12-26 ENCOUNTER — Other Ambulatory Visit: Payer: Self-pay

## 2018-12-26 ENCOUNTER — Emergency Department
Admission: EM | Admit: 2018-12-26 | Discharge: 2018-12-26 | Disposition: A | Discharge status: Home / Self Care | Payer: Medicaid Other | Attending: Emergency Medicine | Admitting: Emergency Medicine

## 2018-12-26 DIAGNOSIS — R1030 Lower abdominal pain, unspecified: Secondary | ICD-10-CM | POA: Insufficient documentation

## 2018-12-26 DIAGNOSIS — R109 Unspecified abdominal pain: Secondary | ICD-10-CM

## 2018-12-26 DIAGNOSIS — F17290 Nicotine dependence, other tobacco product, uncomplicated: Secondary | ICD-10-CM | POA: Insufficient documentation

## 2018-12-26 DIAGNOSIS — Z79899 Other long term (current) drug therapy: Secondary | ICD-10-CM | POA: Diagnosis not present

## 2018-12-26 LAB — CBC
HCT: 40.8 % (ref 36.0–46.0)
Hemoglobin: 14.1 g/dL (ref 12.0–15.0)
MCH: 30.9 pg (ref 26.0–34.0)
MCHC: 34.6 g/dL (ref 30.0–36.0)
MCV: 89.5 fL (ref 80.0–100.0)
Platelets: 268 10*3/uL (ref 150–400)
RBC: 4.56 MIL/uL (ref 3.87–5.11)
RDW: 13.1 % (ref 11.5–15.5)
WBC: 15.9 10*3/uL — ABNORMAL HIGH (ref 4.0–10.5)
nRBC: 0 % (ref 0.0–0.2)

## 2018-12-26 LAB — COMPREHENSIVE METABOLIC PANEL
ALT: 10 U/L (ref 0–44)
AST: 14 U/L — ABNORMAL LOW (ref 15–41)
Albumin: 4.2 g/dL (ref 3.5–5.0)
Alkaline Phosphatase: 44 U/L (ref 38–126)
Anion gap: 11 (ref 5–15)
BUN: 10 mg/dL (ref 6–20)
CO2: 23 mmol/L (ref 22–32)
Calcium: 9.5 mg/dL (ref 8.9–10.3)
Chloride: 103 mmol/L (ref 98–111)
Creatinine, Ser: 0.59 mg/dL (ref 0.44–1.00)
GFR calc Af Amer: >60 mL/min (ref >60)
GFR calc non Af Amer: >60 mL/min (ref >60)
Glucose, Bld: 127 mg/dL — ABNORMAL HIGH (ref 70–99)
Potassium: 3.6 mmol/L (ref 3.5–5.1)
Sodium: 137 mmol/L (ref 135–145)
Total Bilirubin: 0.6 mg/dL (ref 0.3–1.2)
Total Protein: 7.5 g/dL (ref 6.5–8.1)

## 2018-12-26 LAB — URINALYSIS, COMPLETE (UACMP) WITH MICROSCOPIC
Bacteria, UA: NONE SEEN
Bilirubin Urine: NEGATIVE
Glucose, UA: NEGATIVE mg/dL
Hgb urine dipstick: NEGATIVE
Ketones, ur: NEGATIVE mg/dL
Leukocytes,Ua: NEGATIVE
Nitrite: NEGATIVE
Protein, ur: NEGATIVE mg/dL
Specific Gravity, Urine: >1.046 — ABNORMAL HIGH (ref 1.005–1.030)
pH: 6 (ref 5.0–8.0)

## 2018-12-26 LAB — HCG, QUANTITATIVE, PREGNANCY: hCG, Beta Chain, Quant, S: 6 m[IU]/mL — ABNORMAL HIGH (ref <5)

## 2018-12-26 LAB — LIPASE, BLOOD: Lipase: 18 U/L (ref 11–51)

## 2018-12-26 MED ORDER — HYDROMORPHONE HCL 1 MG/ML IJ SOLN
1.0000 mg | Freq: Once | INTRAMUSCULAR | Status: AC
  Administered 2018-12-26: 14:00:00 1 mg via INTRAVENOUS

## 2018-12-26 MED ORDER — DICYCLOMINE HCL 20 MG PO TABS: 20.0000 mg | ORAL_TABLET | Freq: Three times a day (TID) | ORAL | 0 refills | Status: DC | PRN

## 2018-12-26 MED ORDER — OXYCODONE HCL 5 MG PO TABS
10.0000 mg | ORAL_TABLET | Freq: Once | ORAL | Status: AC
  Administered 2018-12-26: 10 mg via ORAL
  Filled 2018-12-26: qty 2

## 2018-12-26 MED ORDER — OXYCODONE-ACETAMINOPHEN 5-325 MG PO TABS: 1.0000 | ORAL_TABLET | ORAL | Status: DC | PRN

## 2018-12-26 MED ORDER — HYDROMORPHONE HCL 1 MG/ML IJ SOLN
INTRAMUSCULAR | Status: AC
  Filled 2018-12-26: qty 1

## 2018-12-26 MED ORDER — HYDROMORPHONE HCL 1 MG/ML IJ SOLN
INTRAMUSCULAR | Status: AC
  Administered 2018-12-26: 1 mg via INTRAVENOUS
  Filled 2018-12-26: qty 1

## 2018-12-26 MED ORDER — HYDROMORPHONE HCL 1 MG/ML IJ SOLN
1.0000 mg | Freq: Once | INTRAMUSCULAR | Status: AC
  Administered 2018-12-26: 15:00:00 1 mg via INTRAVENOUS

## 2018-12-26 MED ORDER — SODIUM CHLORIDE 0.9% FLUSH: 3.0000 mL | Freq: Once | INTRAVENOUS | Status: DC

## 2018-12-26 MED ORDER — IOHEXOL 300 MG/ML  SOLN
100.0000 mL | Freq: Once | INTRAMUSCULAR | Status: AC | PRN
  Administered 2018-12-26: 15:00:00 100 mL via INTRAVENOUS

## 2018-12-26 MED ORDER — SODIUM CHLORIDE 0.9 % IV BOLUS
1000.0000 mL | Freq: Once | INTRAVENOUS | Status: AC
  Administered 2018-12-26: 14:00:00 1000 mL via INTRAVENOUS

## 2018-12-26 MED ORDER — ONDANSETRON 4 MG PO TBDP
4.0000 mg | ORAL_TABLET | Freq: Once | ORAL | Status: AC
  Administered 2018-12-26: 4 mg via ORAL
  Filled 2018-12-26: qty 1

## 2018-12-26 MED ORDER — OXYCODONE-ACETAMINOPHEN 5-325 MG PO TABS: 1.0000 | ORAL_TABLET | Freq: Four times a day (QID) | ORAL | 0 refills | Status: AC | PRN

## 2018-12-26 NOTE — ED Notes (Signed)
Resumed care from [facility].  Pt alert and watching tv.  Iv fluids infusing.  family with  Pt.

## 2018-12-26 NOTE — ED Triage Notes (Signed)
Pt to ED via POV c/o lower abdominal pain. Pt states that she had a D & C about 3 weeks ago. Pt states that she had sex last Tuesday and started to have abdominal pain after intercourse. Pt reports pain has continued to get worse. Pt called her OBGYN who advised her to come to the ED. Pt denies vaginal bleeding. Pt states that she is having nausea but no vomiting or diarrhea, fever or chills.

## 2018-12-26 NOTE — ED Notes (Signed)
poct pregnancy Negative 

## 2018-12-26 NOTE — ED Notes (Signed)
Patient transported to CT 

## 2018-12-26 NOTE — ED Notes (Signed)
Pt reports taking 5 500 mg tylenol between 7 am and 9 am.

## 2018-12-26 NOTE — ED Provider Notes (Signed)
[facility] Emergency Department Provider Note  ____________________________________________  Time seen: Approximately 3:14 PM  I have reviewed the triage vital signs and the nursing notes.   HISTORY  Chief Complaint Abdominal Pain    HPI Kathleen Owen is a 28 y.o. female with a history of Crohn's disease, bacterial vaginosis, recent D&C 3 weeks ago who reports worsening abdominal pain for the past week after having sexual intercourse.  Waxing and waning, no aggravating or alleviating factors.  She has nausea but no vomiting or diarrhea or constipation.  Pain is diffuse lower abdomen, nonradiating.  No vaginal bleeding or discharge.  No urinary symptoms.  No fever   This is a chronic recurrent issue for her that has happened in the past, and she reports having 2 colonoscopies in the past as well which were nondiagnostic.  Past Medical History:  Diagnosis Date  . 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 CD (Crohn's disease) ([place]) 07/30/2014  . Crohn's disease [facility]) Jan 2016  . Depression   . Miscarriage 07/18/2014     Patient Active Problem List   Diagnosis Date Noted  . Supervision of high risk pregnancy, antepartum 11/24/2018  . UTI (urinary tract infection) during pregnancy 07/19/2018  . 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 C. difficile colitis 07/23/2017  . 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 Smoker 11/22/2014  . Recurrent vaginitis 11/22/2014  . 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 CD (Crohn's disease) ([place]) 07/30/2014  . Body mass index (BMI) of 27.0-27.9 in adult 10/03/2013     Past Surgical History:  Procedure Laterality Date  . DILATION AND CURETTAGE OF UTERUS    . DILATION AND EVACUATION N/A 12/02/2016   Procedure: DILATATION AND EVACUATION;  Surgeon: Harlin Heys, MD;  Location: [facility] ORS;  Service: Gynecology;  Laterality:  N/A;  . DILATION AND EVACUATION N/A 06/30/2017   Procedure: DILATATION AND EVACUATION;  Surgeon: Harlin Heys, MD;  Location: [facility] ORS;  Service: Gynecology;  Laterality: N/A;  . DILATION AND EVACUATION N/A 12/06/2018   Procedure: SUCTION D&C;  Surgeon: [name], MD;  Location: [facility] ORS;  Service: Gynecology;  Laterality: N/A;  . TONSILLECTOMY       Prior to Admission medications   Medication Sig Start Date End Date Taking? Authorizing Provider  dicyclomine (BENTYL) 20 MG tablet Take 1 tablet (20 mg total) by mouth 3 (three) times daily as needed for spasms. 12/26/18   Carrie Mew, MD  escitalopram (LEXAPRO) 20 MG tablet Take 1 tablet (20 mg total) by mouth daily. Patient taking differently: Take 20 mg by mouth at bedtime.  10/26/18   [name], MD  HYDROcodone-acetaminophen (NORCO/VICODIN) 5-325 MG tablet Take 1 tablet by mouth every 6 (six) hours as needed. 12/06/18   [name], MD  traZODone (DESYREL) 50 MG tablet Take 1 tablet (50 mg total) by mouth at bedtime. 10/26/18   [name], MD     Allergies Aspirin, Ibuprofen, and Amoxicillin   Family History  Problem Relation Age of Onset  . Kidney disease Maternal Uncle   . Heart failure Maternal Uncle   . Heart failure Maternal Grandmother   . Hypertension Paternal Grandmother   . Thyroid disease Paternal Aunt     Social History Social History   Tobacco Use  . Smoking status: Current Every Day Smoker    Packs/day: 2.00    Types: Cigars  . Smokeless tobacco: Never  Used  Substance Use Topics  . Alcohol use: Yes    Comment: occasionally  . Drug use: No    Review of Systems  Constitutional:   No fever or chills.  ENT:   No sore throat. No rhinorrhea. Cardiovascular:   No chest pain or syncope. Respiratory:   No dyspnea or cough. Gastrointestinal:   Positive as above for lower abdominal pain. Musculoskeletal:   Negative for focal pain or swelling All other systems reviewed and are  negative except as documented above in ROS and HPI.  ____________________________________________   PHYSICAL EXAM:  VITAL SIGNS: ED Triage Vitals  Enc Vitals Group     BP 12/26/18 1049 (!) 102/39     Pulse Rate 12/26/18 1049 85     Resp --      Temp 12/26/18 1049 98.5 F (36.9 C)     Temp Source 12/26/18 1049 Oral     SpO2 12/26/18 1049 98 %     Weight 12/26/18 1053 150 lb (68 kg)     Height 12/26/18 1053 5\' 3"  (1.6 m)     Head Circumference --      Peak Flow --      Pain Score 12/26/18 1053 10     Pain Loc --      Pain Edu? --      Excl. in [place]? --     Vital signs reviewed, nursing assessments reviewed.   Constitutional:   Alert and oriented. Non-toxic appearance. Eyes:   Conjunctivae are normal. EOMI. PERRL. ENT      Head:   Normocephalic and atraumatic.      Nose:   Wearing a mask.      Mouth/Throat:   Wearing a mask.      Neck:   No meningismus. Full ROM. Hematological/Lymphatic/Immunilogical:   No cervical lymphadenopathy. Cardiovascular:   RRR. Symmetric bilateral radial and DP pulses.  No murmurs. Cap refill less than 2 seconds. Respiratory:   Normal respiratory effort without tachypnea/retractions. Breath sounds are clear and equal bilaterally. No wheezes/rales/rhonchi. Gastrointestinal:   Soft with diffuse mild abdominal tenderness, worse in the right lower quadrant.. Non distended. There is no CVA tenderness.  No rebound, rigidity, or guarding.  Musculoskeletal:   Normal range of motion in all extremities. No joint effusions.  No lower extremity tenderness.  No edema. Neurologic:   Normal speech and language.  Motor grossly intact. No acute focal neurologic deficits are appreciated.  Skin:    Skin is warm, dry and intact. No rash noted.  No petechiae, purpura, or bullae.  ____________________________________________    LABS (pertinent positives/negatives) (all labs ordered are listed, but only abnormal results are displayed) Labs Reviewed  COMPREHENSIVE  METABOLIC PANEL - Abnormal; Notable for the following components:      Result Value   Glucose, Bld 127 (*)    AST 14 (*)    All other components within normal limits  CBC - Abnormal; Notable for the following components:   WBC 15.9 (*)    All other components within normal limits  HCG, QUANTITATIVE, PREGNANCY - Abnormal; Notable for the following components:   hCG, Beta Chain, Quant, S 6 (*)    All other components within normal limits  LIPASE, BLOOD  URINALYSIS, COMPLETE (UACMP) WITH MICROSCOPIC  POC URINE PREG, ED   ____________________________________________   EKG    ____________________________________________    RADIOLOGY  Ct Abdomen Pelvis W Contrast  Result Date: 12/26/2018 CLINICAL DATA:  Acute, diffuse abdominal pain. Six weeks status post  D and C. pelvic and mid upper abdominal pain for several weeks with nausea and vomiting today. EXAM: CT ABDOMEN AND PELVIS WITH CONTRAST TECHNIQUE: Multidetector CT imaging of the abdomen and pelvis was performed using the standard protocol following bolus administration of intravenous contrast. CONTRAST:  190mL OMNIPAQUE IOHEXOL 300 MG/ML  SOLN COMPARISON:  10/05/2017 and 07/23/2017. Ob ultrasound obtained earlier today. FINDINGS: Lower chest: Minimal bibasilar dependent atelectasis. Hepatobiliary: No focal liver abnormality is seen. No gallstones, gallbladder wall thickening, or biliary dilatation. Pancreas: Unremarkable. No pancreatic ductal dilatation or surrounding inflammatory changes. Spleen: Normal in size without focal abnormality. Adrenals/Urinary Tract: Adrenal glands are unremarkable. Kidneys are normal, without renal calculi, focal lesion, or hydronephrosis. Bladder is unremarkable. Stomach/Bowel: Stomach is within normal limits. Appendix appears normal. No evidence of bowel wall thickening, distention, or inflammatory changes. Vascular/Lymphatic: No significant vascular findings are present. No enlarged abdominal or pelvic lymph  nodes. Reproductive: The previously demonstrated fundal uterine fibroid has an appearance similar to that seen on 07/23/2017. This demonstrated more homogeneous enhancement on 10/05/2017. This currently measures approximately 4.2 x 3.3 cm on image number 59 series 5 and 3.9 cm in length on coronal image number 40, similar to the previous examinations. The remainder of the uterus continues to have a normal appearance with a normal appearing endometrial stripe. Normal appearing ovaries. Other: No abdominal wall hernia or abnormality. No abdominopelvic ascites. Musculoskeletal: Normal appearing bones. IMPRESSION: 1. No acute abnormality. 2. Stable uterine fibroid. Electronically Signed   By: Claudie Revering M.D.   On: 12/26/2018 15:11   US Ob Less Than 14 Weeks With Ob Transvaginal  Result Date: 12/26/2018 CLINICAL DATA:  Abdominal pain. D and C 12/06/2018, quantitative beta hCG 6.0. EXAM: OBSTETRIC <14 WK [place] AND TRANSVAGINAL OB US TECHNIQUE: Both transabdominal and transvaginal ultrasound examinations were performed for complete evaluation of the gestation as well as the maternal uterus, adnexal regions, and pelvic cul-de-sac. Transvaginal technique was performed to assess early pregnancy. COMPARISON:  12/01/2018 images without report. FINDINGS: Intrauterine gestational sac: None visualized. Yolk sac:  None visualized. Embryo:  None visualized. Cardiac Activity: None visualized. Heart Rate: None visualized.  Bpm MSD: Not visualized. CRL:  None visualized. Subchorionic hemorrhage:  None visualized. Maternal uterus/adnexae: Uterus normal size, shape and position. Ovaries normal size, shape and position with normal color Doppler. Fundal fibroid measuring 3.7 x 3.9 x 4 cm. Endometrium measures 4.3 mm in thickness. No evidence of retained products of conception. IMPRESSION: 1. Findings compatible with patient's recent failed pregnancy with dilatation and curettage. Normal endometrium. No evidence of retained products of  conception. 2.  4 cm fundal fibroid. 3.  Normal ovaries. Electronically Signed   By: Marin Olp M.D.   On: 12/26/2018 13:26    ____________________________________________   PROCEDURES Procedures  ____________________________________________  DIFFERENTIAL DIAGNOSIS   Ovarian cyst, retained POC/endometritis, appendicitis.  Doubt torsion, ectopic, TOA, PID  CLINICAL IMPRESSION / ASSESSMENT AND PLAN / ED COURSE  Medications ordered in the ED: Medications  sodium chloride flush (NS) 0.9 % injection 3 mL (has no administration in time range)  HYDROmorphone (DILAUDID) 1 MG/ML injection (  Not Given 12/26/18 1431)  ondansetron (ZOFRAN-ODT) disintegrating tablet 4 mg (4 mg Oral Given 12/26/18 1133)  oxyCODONE (Oxy IR/ROXICODONE) immediate release tablet 10 mg (10 mg Oral Given 12/26/18 1134)  sodium chloride 0.9 % bolus 1,000 mL (1,000 mLs Intravenous New Bag/Given 12/26/18 1409)  HYDROmorphone (DILAUDID) injection 1 mg (1 mg Intravenous Given 12/26/18 1408)  iohexol (OMNIPAQUE) 300 MG/ML solution 100 mL (  100 mLs Intravenous Contrast Given 12/26/18 1447)  HYDROmorphone (DILAUDID) injection 1 mg (1 mg Intravenous Given 12/26/18 1444)    Pertinent labs & imaging results that were available during my care of the patient were reviewed by me and considered in my medical decision making (see chart for details).  KERIS EBANKS was evaluated in Emergency Department on 12/26/2018 for the symptoms described in the history of present illness. She was evaluated in the context of the global COVID-19 pandemic, which necessitated consideration that the patient might be at risk for infection with the SARS-CoV-2 virus that causes COVID-19. Institutional protocols and algorithms that pertain to the evaluation of patients at risk for COVID-19 are in a state of rapid change based on information released by regulatory bodies including the CDC and federal and state organizations. These policies and algorithms were  followed during the patient's care in the ED.   Patient presents with low abdominal pain, worse on the right with tenderness on exam.  Vital signs are normal.  This is a recurrent chronic issue for her.  Initial labs are unremarkable with a hCG of 6.  Is possible this represents pregnancy although it would be extremely early and impossible to visualize any tissue at this stage.  A pelvic ultrasound was performed which is negative for any significant findings, and appears normal.  Due to her right lower quadrant tenderness, CT scan is ordered to evaluate for appendicitis.  Patient given 2 doses of Dilaudid 1 mg IV for pain control. Signed out to Dr. Archie Balboa at 3:15pm awaiting CT result and UA.      ____________________________________________   FINAL CLINICAL IMPRESSION(S) / ED DIAGNOSES    Final diagnoses:  Abdominal pain     ED Discharge Orders         Ordered    dicyclomine (BENTYL) 20 MG tablet  3 times daily PRN     12/26/18 1514          Portions of this note were generated with dragon dictation software. Dictation errors may occur despite best attempts at proofreading.   Carrie Mew, MD 12/26/18 1517

## 2018-12-26 NOTE — Discharge Instructions (Signed)
Your pregnancy hormone level is 6 today, which may represent a new pregnancy.  You can recheck a home pregnancy test in 7-10 days to see if it becomes positive.  Please follow up with gynecology if the symptoms persist.

## 2019-01-02 ENCOUNTER — Ambulatory Visit (INDEPENDENT_AMBULATORY_CARE_PROVIDER_SITE_OTHER): Payer: Medicaid Other | Admitting: Obstetrics and Gynecology

## 2019-01-02 ENCOUNTER — Other Ambulatory Visit: Payer: Self-pay

## 2019-01-02 DIAGNOSIS — D251 Intramural leiomyoma of uterus: Secondary | ICD-10-CM

## 2019-01-02 DIAGNOSIS — N96 Recurrent pregnancy loss: Secondary | ICD-10-CM

## 2019-01-02 DIAGNOSIS — Z4889 Encounter for other specified surgical aftercare: Secondary | ICD-10-CM

## 2019-01-02 NOTE — Progress Notes (Signed)
I connected with Kathleen Owen on 01/02/19 at 11:10 AM EST by telephone and verified that I am speaking with the correct person using two identifiers.   I discussed the limitations, risks, security and privacy concerns of performing an evaluation and management service by telephone and the availability of in person appointments. I also discussed with the patient that there may be a patient responsible charge related to this service. The patient expressed understanding and agreed to proceed.  The patient was at home I spoke with the patient from my workstation phone The names of people involved in this encounter were: Kathleen Owen , and Kathleen Owen   Postoperative Follow-up Patient presents post op from suction D&C 6weeks ago for missed abortion in setting of recurrent miscarriage.  Subjective: Patient reports marked improvement in her preop symptoms. Eating a regular diet without difficulty. The patient is not having any pain.  Activity: normal activities of daily living.  Objective: Last menstrual period 09/26/2018, unknown if currently breastfeeding.  No physical exam as this was a remote telephone visit to promote social distancing during the current COVID-19 Pandemic   Admission on 12/26/2018, Discharged on 12/26/2018  Component Date Value Ref Range Status  . Lipase 12/26/2018 18  11 - 51 U/L Final   Performed at [facility], [address] [place], [place] [address]  . Sodium 12/26/2018 137  135 - 145 mmol/L Final  . Potassium 12/26/2018 3.6  3.5 - 5.1 mmol/L Final  . Chloride 12/26/2018 103  98 - 111 mmol/L Final  . CO2 12/26/2018 23  22 - 32 mmol/L Final  . Glucose, Bld 12/26/2018 127* 70 - 99 mg/dL Final  . BUN 12/26/2018 10  6 - 20 mg/dL Final  . Creatinine, Ser 12/26/2018 0.59  0.44 - 1.00 mg/dL Final  . Calcium 12/26/2018 9.5  8.9 - 10.3 mg/dL Final  . Total Protein 12/26/2018 7.5  6.5 - 8.1 g/dL Final  . Albumin 12/26/2018 4.2  3.5 - 5.0 g/dL  Final  . AST 12/26/2018 14* 15 - 41 U/L Final  . ALT 12/26/2018 10  0 - 44 U/L Final  . Alkaline Phosphatase 12/26/2018 44  38 - 126 U/L Final  . Total Bilirubin 12/26/2018 0.6  0.3 - 1.2 mg/dL Final  . GFR calc non Af Amer 12/26/2018 >60  >60 mL/min Final  . GFR calc Af Amer 12/26/2018 >60  >60 mL/min Final  . Anion gap 12/26/2018 11  5 - 15 Final   Performed at [facility], [address] [place], [place] [address]  . WBC 12/26/2018 15.9* 4.0 - 10.5 K/uL Final  . RBC 12/26/2018 4.56  3.87 - 5.11 MIL/uL Final  . Hemoglobin 12/26/2018 14.1  12.0 - 15.0 g/dL Final  . HCT 12/26/2018 40.8  36.0 - 46.0 % Final  . MCV 12/26/2018 89.5  80.0 - 100.0 fL Final  . MCH 12/26/2018 30.9  26.0 - 34.0 pg Final  . MCHC 12/26/2018 34.6  30.0 - 36.0 g/dL Final  . RDW 12/26/2018 13.1  11.5 - 15.5 % Final  . Platelets 12/26/2018 268  150 - 400 K/uL Final  . nRBC 12/26/2018 0.0  0.0 - 0.2 % Final   Performed at [facility], [address], [place], [place] [address]  . Color, Urine 12/26/2018 STRAW* YELLOW Final  . APPearance 12/26/2018 CLEAR* CLEAR Final  . Specific Gravity, Urine 12/26/2018 >1.046* 1.005 - 1.030 Final  . pH 12/26/2018 6.0  5.0 - 8.0  Final  . Glucose, UA 12/26/2018 NEGATIVE  NEGATIVE mg/dL Final  . Hgb urine dipstick 12/26/2018 NEGATIVE  NEGATIVE Final  . Bilirubin Urine 12/26/2018 NEGATIVE  NEGATIVE Final  . Ketones, ur 12/26/2018 NEGATIVE  NEGATIVE mg/dL Final  . Protein, ur 12/26/2018 NEGATIVE  NEGATIVE mg/dL Final  . Nitrite 12/26/2018 NEGATIVE  NEGATIVE Final  . Leukocytes,Ua 12/26/2018 NEGATIVE  NEGATIVE Final  . RBC / HPF 12/26/2018 0-5  0 - 5 RBC/hpf Final  . WBC, UA 12/26/2018 0-5  0 - 5 WBC/hpf Final  . Bacteria, UA 12/26/2018 NONE SEEN  NONE SEEN Final  . Squamous Epithelial / LPF 12/26/2018 6-10  0 - 5 Final   Performed at [facility], [address], [place], [place] [address]  . hCG, Beta Chain, Quant, S 12/26/2018 6* <5 mIU/mL Final    Comment:          GEST. AGE      CONC.  (mIU/mL)   <=1 WEEK        5 - 50     2 WEEKS       50 - 500     3 WEEKS       100 - 10,000     4 WEEKS     1,000 - 30,000     5 WEEKS     3,500 - 115,000   6-8 WEEKS     12,000 - 270,000    12 WEEKS     15,000 - 220,000        FEMALE AND NON-PREGNANT FEMALE:     LESS THAN 5 mIU/mL Performed at [facility], [address] [place], [place] [address]     Assessment: 28 y.o. s/p suction D&C stable  Plan: Patient has done well after surgery with no apparent complications.  I have discussed the post-operative course to date, and the expected progress moving forward.  The patient understands what complications to be concerned about.  I will see the patient in routine follow up, or sooner if needed.    Activity plan: No restriction.  Recent ER visit for pain, has not has menstrual cycle, HCG 3mU/mL on 12/26/2018.  Her pain is mostly left sided which also incidentally correlated with the side her 5cm left fundal fibroid is located.  The patient is interested in proceeding with myomectomy will post.  Discussed cytogenetic on fetus were normal XY karyotype.  She has also undergone thyroid testing, diabetes testing, normal karyotype (not done on partner yet) and hypercoagulable work up.  Telephone time 9:32 minutes   AMalachy Mood MD, FReading [facility] 01/02/2019, 11:53 AM

## 2019-01-10 ENCOUNTER — Telehealth: Payer: Self-pay | Admitting: Obstetrics and Gynecology

## 2019-01-10 NOTE — Telephone Encounter (Signed)
Patient is aware of H&P at [facility] on 03/03/19 @ 10:10am, Pre-admit Testing phone interview to be scheduled, COVID testing on 03/07/19, and OR on 03/09/19. Patient is aware to quarantine after COVID testing. Patient is aware she may receive calls from the [facility]. Patient confirmed Medicaid. Patient is still having pain in left side and back, and requests a call back from Dr. Georgianne Fick.

## 2019-01-10 NOTE — Telephone Encounter (Signed)
-----   Message from [name], MD sent at 01/02/2019 11:59 AM EST ----- Regarding: Surgery Surgery Booking Request Patient Full Name:  Kathleen Owen  MRN: [id number]  DOB: [birth year]  Surgeon: [name], MD  Requested Surgery Date and Time: 2-8 weeks Primary Diagnosis AND Code: D25.1 Intramural fibroid Secondary Diagnosis and Code: N96 recurrent miscarriage Surgical Procedure: Myomectomy via laparotomy  L&D Notification: No Admission Status: surgery admit Length of Surgery: 1-2 days Special Case Needs: No H&P: Yes Phone Interview???:  Yes Interpreter: No Language:  Medical Clearance:  No Special Scheduling Instructions: none Any known health/anesthesia issues, diabetes, sleep apnea, latex allergy, defibrillator/pacemaker?: No Acuity: P4   (P1 highest, P2 delay may cause harm, P3 low, elective gyn, P4 lowest)

## 2019-01-13 ENCOUNTER — Other Ambulatory Visit: Payer: Self-pay | Admitting: Obstetrics and Gynecology

## 2019-02-22 ENCOUNTER — Telehealth: Payer: Self-pay | Admitting: Obstetrics and Gynecology

## 2019-02-22 NOTE — Telephone Encounter (Signed)
Patient is aware of rescheduled H&P on 2/1 @ 10:10am w/ Dr. Georgianne Fick, Pre-admit testing to be rescheduled, COVID testing on 2/9, and Or on 04/06/19, due to all surgeries w/ overnight stays prior to 2/1 having to be rescheduled.

## 2019-02-27 ENCOUNTER — Other Ambulatory Visit: Payer: Medicaid Other

## 2019-03-03 ENCOUNTER — Encounter: Payer: Medicaid Other | Admitting: Obstetrics and Gynecology

## 2019-03-03 ENCOUNTER — Other Ambulatory Visit: Payer: Medicaid Other

## 2019-03-07 ENCOUNTER — Other Ambulatory Visit: Payer: Self-pay | Admitting: Obstetrics and Gynecology

## 2019-03-07 ENCOUNTER — Telehealth: Payer: Self-pay | Admitting: Obstetrics and Gynecology

## 2019-03-07 ENCOUNTER — Other Ambulatory Visit: Payer: Medicaid Other

## 2019-03-07 NOTE — Telephone Encounter (Signed)
So we can't do narcotics so with her Crohn's tylenol is really the only thing she can take

## 2019-03-07 NOTE — Telephone Encounter (Signed)
Spoke to the patient to let her know elective surgeries are cancelled effective 03/20/19, and that I will call her as soon as we are able to start scheduling again. Patient would like to know what she should do for pain, and will send Dr. Georgianne Fick a MyChart message.

## 2019-03-23 NOTE — Telephone Encounter (Signed)
Lmtrc

## 2019-03-24 NOTE — Telephone Encounter (Signed)
Patient is aware of H&P at [facility] on 2/1 @ 8:50am w/ Dr. Georgianne Fick, Pre-admit testing to be rescheduled, COVID testing on 2/9, and OR on 04/06/19.

## 2019-03-27 ENCOUNTER — Encounter: Payer: Medicaid Other | Admitting: Obstetrics and Gynecology

## 2019-03-27 ENCOUNTER — Other Ambulatory Visit: Payer: Self-pay | Admitting: Obstetrics and Gynecology

## 2019-03-28 ENCOUNTER — Other Ambulatory Visit: Payer: Medicaid Other

## 2019-03-31 ENCOUNTER — Other Ambulatory Visit: Payer: Self-pay

## 2019-03-31 ENCOUNTER — Ambulatory Visit (INDEPENDENT_AMBULATORY_CARE_PROVIDER_SITE_OTHER): Payer: Medicaid Other | Admitting: Obstetrics and Gynecology

## 2019-03-31 ENCOUNTER — Encounter: Payer: Self-pay | Admitting: Obstetrics and Gynecology

## 2019-03-31 VITALS — BP 98/68 | HR 97 | Ht 63.0 in | Wt 138.0 lb

## 2019-03-31 DIAGNOSIS — Z01818 Encounter for other preprocedural examination: Secondary | ICD-10-CM | POA: Diagnosis not present

## 2019-03-31 DIAGNOSIS — N96 Recurrent pregnancy loss: Secondary | ICD-10-CM | POA: Diagnosis not present

## 2019-03-31 DIAGNOSIS — D251 Intramural leiomyoma of uterus: Secondary | ICD-10-CM | POA: Diagnosis not present

## 2019-03-31 NOTE — Progress Notes (Signed)
Obstetrics & Gynecology Surgery H&P    Chief Complaint: Scheduled Surgery   History of Present Illness: Patient is a 29 y.o. [id number] presenting for scheduled myomectomy, for the treatment or further evaluation of uterine fibroids, recurrent miscarriage.   Prior Treatments prior to proceeding with surgery include: expectant mangement  Preoperative Pap: 11/22/2019 NIL Preoperative Endometrial biopsy: N/A Preoperative Ultrasound: CT scan 12/26/2018 4x2 x 3.3 x 3.9 cm uterine fibroid  Review of Systems:10 point review of systems  Past Medical History:  Past Medical History:  Diagnosis Date  . 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 CD (Crohn's disease) ([place]) 07/30/2014  . Crohn's disease [facility]) Jan 2016  . Depression   . Miscarriage 07/18/2014    Past Surgical History:  Past Surgical History:  Procedure Laterality Date  . DILATION AND CURETTAGE OF UTERUS    . DILATION AND EVACUATION N/A 12/02/2016   Procedure: DILATATION AND EVACUATION;  Surgeon: Harlin Heys, MD;  Location: [facility] ORS;  Service: Gynecology;  Laterality: N/A;  . DILATION AND EVACUATION N/A 06/30/2017   Procedure: DILATATION AND EVACUATION;  Surgeon: Harlin Heys, MD;  Location: [facility] ORS;  Service: Gynecology;  Laterality: N/A;  . DILATION AND EVACUATION N/A 12/06/2018   Procedure: SUCTION D&C;  Surgeon: [name], MD;  Location: [facility] ORS;  Service: Gynecology;  Laterality: N/A;  . TONSILLECTOMY      Family History:  Family History  Problem Relation Age of Onset  . Kidney disease Maternal Uncle   . Heart failure Maternal Uncle   . Heart failure Maternal Grandmother   . Hypertension Paternal Grandmother   . Thyroid disease Paternal Aunt     Social History:  Social History   Socioeconomic History  . Marital status: Single    Spouse name: Not on file  . Number of children: Not on file  . Years of education: Not on file  . Highest  education level: Not on file  Occupational History  . Not on file  Tobacco Use  . Smoking status: Current Every Day Smoker    Packs/day: 2.00    Types: Cigars  . Smokeless tobacco: Never Used  Substance and Sexual Activity  . Alcohol use: Yes    Comment: occasionally  . Drug use: No  . Sexual activity: Yes    Birth control/protection: None  Other Topics Concern  . Not on file  Social History Narrative  . Not on file   Social Determinants of Health   Financial Resource Strain:   . Difficulty of Paying Living Expenses: Not on file  Food Insecurity:   . Worried About Charity fundraiser in the Last Year: Not on file  . Ran Out of Food in the Last Year: Not on file  Transportation Needs:   . Lack of Transportation (Medical): Not on file  . Lack of Transportation (Non-Medical): Not on file  Physical Activity:   . Days of Exercise per Week: Not on file  . Minutes of Exercise per Session: Not on file  Stress:   . Feeling of Stress : Not on file  Social Connections:   . Frequency of Communication with Friends and Family: Not on file  . Frequency of Social Gatherings with Friends and Family: Not on file  . Attends Religious Services: Not on file  . Active Member of Clubs or Organizations: Not on file  . Attends Archivist Meetings: Not on file  . Marital Status: Not  on file  Intimate Partner Violence:   . Fear of Current or Ex-Partner: Not on file  . Emotionally Abused: Not on file  . Physically Abused: Not on file  . Sexually Abused: Not on file    Allergies:  Allergies  Allergen Reactions  . Aspirin Other (See Comments)    Chron's Disease  . Ibuprofen Other (See Comments)    Chron's Disease  . Amoxicillin Rash and Other (See Comments)    Has patient had a PCN reaction causing immediate rash, facial/tongue/throat swelling, SOB or lightheadedness with hypotension: Unknown Has patient had a PCN reaction causing severe rash involving mucus membranes or skin  necrosis: Unknown Has patient had a PCN reaction that required hospitalization: Unknown Has patient had a PCN reaction occurring within the last 10 years: No If all of the above answers are "NO", then may proceed with Cephalosporin use.     Medications: Prior to Admission medications   Medication Sig Start Date End Date Taking? Authorizing Provider  acetaminophen (TYLENOL) 500 MG tablet Take 1,500 mg by mouth every 6 (six) hours as needed for mild pain or headache.   Yes [provider]  escitalopram (LEXAPRO) 20 MG tablet Take 1 tablet (20 mg total) by mouth daily. Patient taking differently: Take 20 mg by mouth at bedtime.  10/26/18  Yes [name], MD  traZODone (DESYREL) 50 MG tablet Take 1 tablet (50 mg total) by mouth at bedtime. Patient taking differently: Take 50 mg by mouth at bedtime as needed for sleep.  10/26/18  Yes [name], MD    Physical Exam Vitals: Blood pressure 98/68, pulse 97, height 5' 3"  (1.6 m), weight 138 lb (62.6 kg), last menstrual period 03/27/2019, unknown if currently breastfeeding.  General: NAD HEENT: normocephalic, anicteric Pulmonary: No increased work of breathing, CTAB Cardiovascular: RRR, distal pulses 2+ Abdomen: soft, non-tender, non-distended Extremities: no edema, erythema, or tenderness Neurologic: Grossly intact Psychiatric: mood appropriate, affect full  Imaging No results found.  Assessment: 29 y.o. [id number] presenting for scheduled myomectomy via laparotomy  Plan: 1) I have had a careful discussion with this patient about all the options available and the risk/benefits of each. I have fully informed this patient that a laparoscopy may subject her to a variety of discomforts and risks: She understands that most patients have surgery with little difficulty, but problems can happen ranging from minor to fatal. These include nausea, vomiting, pain, bleeding, infection, poor healing, hernia, or formation of adhesions.  Unexpected reactions may occur from any drug or anesthetic given. Unintended injury may occur to other pelvic or abdominal structures such as Fallopian tubes, ovaries, bladder, ureter (tube from kidney to bladder), or bowel. Nerves going from the pelvis to the legs may be injured. Any such injury may require immediate or later additional surgery to correct the problem. Excessive blood loss requiring transfusion is very unlikely but possible. Dangerous blood clots may form in the legs or lungs. Physical and sexual activity will be restricted in varying degrees for an indeterminate period of time but most often 2-4 weeks. Finally, she understands that it is impossible to list every possible undesirable effect and that the condition for which surgery is done is not always cured or significantly improved, and in rare cases may be even worsen. Ample time was given to answer all questions.  2) Routine postoperative instructions were reviewed with the patient and her family in detail today including the expected length of recovery and likely postoperative course.  The patient concurred with  the proposed plan, giving informed written consent for the surgery today.  Patient instructed on the importance of being NPO after midnight prior to her procedure.  If warranted preoperative prophylactic antibiotics and SCDs ordered on call to the OR to meet SCIP guidelines and adhere to recommendation laid forth in [place] Number 104 May 2009  "Antibiotic Prophylaxis for Gynecologic Procedures".     [name], MD, [facility], [facility] 03/31/2019, 9:50 AM

## 2019-04-03 ENCOUNTER — Other Ambulatory Visit
Admission: RE | Admit: 2019-04-03 | Discharge: 2019-04-03 | Disposition: A | Discharge status: Home / Self Care | Payer: Medicaid Other | Source: Ambulatory Visit | Attending: Obstetrics and Gynecology | Admitting: Obstetrics and Gynecology

## 2019-04-03 ENCOUNTER — Other Ambulatory Visit: Payer: Self-pay

## 2019-04-03 DIAGNOSIS — Z20822 Contact with and (suspected) exposure to covid-19: Secondary | ICD-10-CM | POA: Insufficient documentation

## 2019-04-03 DIAGNOSIS — Z01812 Encounter for preprocedural laboratory examination: Secondary | ICD-10-CM | POA: Insufficient documentation

## 2019-04-03 NOTE — Patient Instructions (Signed)
Your procedure is scheduled on: Thursday 04/06/19.  Report to DAY SURGERY DEPARTMENT LOCATED ON 2ND FLOOR MEDICAL MALL ENTRANCE. To find out your arrival time please call [phone] between 1PM - 3PM on Wednesday 04/05/19.   Remember: Instructions that are not followed completely may result in serious medical risk, up to and including death, or upon the discretion of your surgeon and anesthesiologist your surgery may need to be rescheduled.      _X__ 1. Do not eat food after midnight the night before your procedure.                 No gum chewing or hard candies. You may drink clear liquids up to 2 hours                 before you are scheduled to arrive for your surgery- DO NOT drink clear                 liquids within 2 hours of the start of your surgery.                 Clear Liquids include:  water, apple juice without pulp, clear carbohydrate                 drink such as Clearfast or Gatorade, Black Coffee or Tea (Do not add                 anything to coffee or tea).   __X__2.  On the morning of surgery brush your teeth with toothpaste and water, you may rinse your mouth with mouthwash if you wish.  Do not swallow any toothpaste or mouthwash.       _X__ 3.  No Alcohol for 24 hours before or after surgery.    _X__ 4.  Do Not Smoke or use e-cigarettes For 24 Hours Prior to Your Surgery.                 Do not use any chewable tobacco products for at least 6 hours prior to                 surgery.   __X__5.  Notify your doctor if there is any change in your medical condition      (cold, fever, infections).      Do not wear jewelry, make-up, hairpins, clips or nail polish. Do not wear lotions, powders, or perfumes.  Do not shave 48 hours prior to surgery. Men may shave face and neck. Do not bring valuables to the hospital.      [facility] is not responsible for any belongings or valuables.   Contacts, dentures/partials or body piercings may not be worn into surgery.  Bring a case for your contacts, glasses or hearing aids, a denture cup will be supplied.    Patients discharged the day of surgery will not be allowed to drive home.     __X__ Take these medicines the morning of surgery with A SIP OF WATER:     1. acetaminophen (TYLENOL) 500 MG tablet if needed     __X__ Use CHG Soap/SAGE wipes as directed    __X__ Stop Anti-inflammatories 7 days before surgery such as Advil, Ibuprofen, Motrin, BC or Goodies Powder, Naprosyn, Naproxen, Aleve, Aspirin, Meloxicam. May take Tylenol if needed for pain or discomfort.     __X__ Don't start taking any new herbal supplements before your procedure.

## 2019-04-04 ENCOUNTER — Other Ambulatory Visit: Payer: Medicaid Other

## 2019-04-04 ENCOUNTER — Encounter
Admission: RE | Admit: 2019-04-04 | Discharge: 2019-04-04 | Disposition: A | Discharge status: Home / Self Care | Payer: Medicaid Other | Source: Ambulatory Visit | Attending: Obstetrics and Gynecology | Admitting: Obstetrics and Gynecology

## 2019-04-04 DIAGNOSIS — Z01812 Encounter for preprocedural laboratory examination: Secondary | ICD-10-CM | POA: Diagnosis not present

## 2019-04-04 LAB — TYPE AND SCREEN
ABO/RH(D): A POS
Antibody Screen: NEGATIVE

## 2019-04-04 LAB — CBC
HCT: 41.4 % (ref 36.0–46.0)
Hemoglobin: 14.1 g/dL (ref 12.0–15.0)
MCH: 31 pg (ref 26.0–34.0)
MCHC: 34.1 g/dL (ref 30.0–36.0)
MCV: 91 fL (ref 80.0–100.0)
Platelets: 236 10*3/uL (ref 150–400)
RBC: 4.55 MIL/uL (ref 3.87–5.11)
RDW: 13.5 % (ref 11.5–15.5)
WBC: 15 10*3/uL — ABNORMAL HIGH (ref 4.0–10.5)
nRBC: 0 % (ref 0.0–0.2)

## 2019-04-04 LAB — SARS CORONAVIRUS 2 (TAT 6-24 HRS): SARS Coronavirus 2: NEGATIVE

## 2019-04-06 ENCOUNTER — Other Ambulatory Visit: Payer: Self-pay

## 2019-04-06 ENCOUNTER — Encounter
Admission: RE | Disposition: A | Discharge status: Home / Self Care | Payer: Self-pay | Source: Home / Self Care | Attending: Obstetrics and Gynecology

## 2019-04-06 ENCOUNTER — Encounter: Payer: Self-pay | Admitting: Obstetrics and Gynecology

## 2019-04-06 ENCOUNTER — Inpatient Hospital Stay: Payer: Medicaid Other | Admitting: Certified Registered"

## 2019-04-06 ENCOUNTER — Inpatient Hospital Stay
Admission: RE | Admit: 2019-04-06 | Discharge: 2019-04-09 | DRG: 743 | Disposition: A | Discharge status: Home / Self Care | Payer: Medicaid Other | Attending: Obstetrics and Gynecology | Admitting: Obstetrics and Gynecology

## 2019-04-06 DIAGNOSIS — Z880 Allergy status to penicillin: Secondary | ICD-10-CM | POA: Diagnosis not present

## 2019-04-06 DIAGNOSIS — D259 Leiomyoma of uterus, unspecified: Secondary | ICD-10-CM | POA: Diagnosis present

## 2019-04-06 DIAGNOSIS — Z9889 Other specified postprocedural states: Secondary | ICD-10-CM

## 2019-04-06 DIAGNOSIS — N96 Recurrent pregnancy loss: Secondary | ICD-10-CM | POA: Diagnosis present

## 2019-04-06 DIAGNOSIS — D251 Intramural leiomyoma of uterus: Secondary | ICD-10-CM | POA: Diagnosis present

## 2019-04-06 HISTORY — PX: MYOMECTOMY: SHX85

## 2019-04-06 LAB — POCT PREGNANCY, URINE: Preg Test, Ur: NEGATIVE

## 2019-04-06 SURGERY — MYOMECTOMY, ABDOMINAL APPROACH
Anesthesia: General

## 2019-04-06 MED ORDER — DEXAMETHASONE SODIUM PHOSPHATE 10 MG/ML IJ SOLN
INTRAMUSCULAR | Status: DC | PRN
  Administered 2019-04-06: 10 mg via INTRAVENOUS

## 2019-04-06 MED ORDER — NICOTINE 21 MG/24HR TD PT24
21.0000 mg | MEDICATED_PATCH | Freq: Every day | TRANSDERMAL | Status: DC
  Administered 2019-04-06 – 2019-04-09 (×4): 21 mg via TRANSDERMAL
  Filled 2019-04-06 (×4): qty 1

## 2019-04-06 MED ORDER — ACETAMINOPHEN 10 MG/ML IV SOLN
INTRAVENOUS | Status: DC | PRN
  Administered 2019-04-06: 1000 mg via INTRAVENOUS

## 2019-04-06 MED ORDER — ESCITALOPRAM OXALATE 10 MG PO TABS
20.0000 mg | ORAL_TABLET | Freq: Every day | ORAL | Status: DC
  Administered 2019-04-06 – 2019-04-08 (×3): 20 mg via ORAL
  Filled 2019-04-06 (×3): qty 2

## 2019-04-06 MED ORDER — ONDANSETRON HCL 4 MG/2ML IJ SOLN
4.0000 mg | Freq: Four times a day (QID) | INTRAMUSCULAR | Status: DC | PRN
  Administered 2019-04-06 – 2019-04-07 (×2): 4 mg via INTRAVENOUS
  Filled 2019-04-06 (×2): qty 2

## 2019-04-06 MED ORDER — PHENYLEPHRINE HCL (PRESSORS) 10 MG/ML IV SOLN
INTRAVENOUS | Status: DC | PRN
  Administered 2019-04-06 (×2): 100 ug via INTRAVENOUS

## 2019-04-06 MED ORDER — MIDAZOLAM HCL 2 MG/2ML IJ SOLN
INTRAMUSCULAR | Status: DC | PRN
  Administered 2019-04-06: 2 mg via INTRAVENOUS

## 2019-04-06 MED ORDER — ONDANSETRON HCL 4 MG/2ML IJ SOLN
INTRAMUSCULAR | Status: AC
  Filled 2019-04-06: qty 2

## 2019-04-06 MED ORDER — SUGAMMADEX SODIUM 200 MG/2ML IV SOLN
INTRAVENOUS | Status: AC
  Filled 2019-04-06: qty 2

## 2019-04-06 MED ORDER — ONDANSETRON HCL 4 MG/2ML IJ SOLN
INTRAMUSCULAR | Status: DC | PRN
  Administered 2019-04-06: 4 mg via INTRAVENOUS

## 2019-04-06 MED ORDER — GENTAMICIN SULFATE 40 MG/ML IJ SOLN
5.0000 mg/kg | INTRAVENOUS | Status: AC
  Administered 2019-04-06: 310 mg via INTRAVENOUS
  Filled 2019-04-06: qty 7.75

## 2019-04-06 MED ORDER — FAMOTIDINE 20 MG PO TABS
ORAL_TABLET | ORAL | Status: AC
  Administered 2019-04-06: 20 mg via ORAL
  Filled 2019-04-06: qty 1

## 2019-04-06 MED ORDER — FENTANYL CITRATE (PF) 100 MCG/2ML IJ SOLN
INTRAMUSCULAR | Status: AC
  Filled 2019-04-06: qty 2

## 2019-04-06 MED ORDER — PROPOFOL 10 MG/ML IV BOLUS
INTRAVENOUS | Status: DC | PRN
  Administered 2019-04-06: 150 mg via INTRAVENOUS

## 2019-04-06 MED ORDER — FENTANYL CITRATE (PF) 100 MCG/2ML IJ SOLN
INTRAMUSCULAR | Status: AC
  Administered 2019-04-06: 25 ug via INTRAVENOUS
  Filled 2019-04-06: qty 2

## 2019-04-06 MED ORDER — MORPHINE SULFATE (PF) 2 MG/ML IV SOLN
1.0000 mg | INTRAVENOUS | Status: DC | PRN
  Administered 2019-04-06: 2 mg via INTRAVENOUS
  Filled 2019-04-06: qty 1

## 2019-04-06 MED ORDER — DIPHENHYDRAMINE HCL 50 MG/ML IJ SOLN
INTRAMUSCULAR | Status: DC | PRN
  Administered 2019-04-06: 12.5 mg via INTRAVENOUS

## 2019-04-06 MED ORDER — ROCURONIUM BROMIDE 50 MG/5ML IV SOLN
INTRAVENOUS | Status: AC
  Filled 2019-04-06: qty 2

## 2019-04-06 MED ORDER — OXYCODONE-ACETAMINOPHEN 5-325 MG PO TABS: 1.0000 | ORAL_TABLET | Freq: Once | ORAL | Status: AC

## 2019-04-06 MED ORDER — PROMETHAZINE HCL 25 MG/ML IJ SOLN: 6.2500 mg | INTRAMUSCULAR | Status: DC | PRN

## 2019-04-06 MED ORDER — VASOPRESSIN 20 UNIT/ML IV SOLN
INTRAVENOUS | Status: DC | PRN
  Administered 2019-04-06: 10 [IU] via INTRAMUSCULAR

## 2019-04-06 MED ORDER — ACETAMINOPHEN 10 MG/ML IV SOLN
INTRAVENOUS | Status: AC
  Filled 2019-04-06: qty 100

## 2019-04-06 MED ORDER — LACTATED RINGERS IV SOLN: INTRAVENOUS | Status: DC

## 2019-04-06 MED ORDER — OXYCODONE-ACETAMINOPHEN 5-325 MG PO TABS
ORAL_TABLET | ORAL | Status: AC
  Administered 2019-04-06: 1 via ORAL
  Filled 2019-04-06: qty 1

## 2019-04-06 MED ORDER — CLINDAMYCIN PHOSPHATE 900 MG/50ML IV SOLN
900.0000 mg | INTRAVENOUS | Status: AC
  Administered 2019-04-06: 900 mg via INTRAVENOUS

## 2019-04-06 MED ORDER — OXYCODONE-ACETAMINOPHEN 5-325 MG PO TABS: 1.0000 | ORAL_TABLET | ORAL | Status: DC | PRN

## 2019-04-06 MED ORDER — DEXTROSE-NACL 5-0.45 % IV SOLN: INTRAVENOUS | Status: DC

## 2019-04-06 MED ORDER — SODIUM CHLORIDE (PF) 0.9 % IJ SOLN
INTRAMUSCULAR | Status: AC
  Filled 2019-04-06: qty 50

## 2019-04-06 MED ORDER — MENTHOL 3 MG MT LOZG
1.0000 | LOZENGE | OROMUCOSAL | Status: DC | PRN
  Filled 2019-04-06 (×2): qty 9

## 2019-04-06 MED ORDER — FENTANYL CITRATE (PF) 100 MCG/2ML IJ SOLN
INTRAMUSCULAR | Status: DC | PRN
  Administered 2019-04-06 (×2): 50 ug via INTRAVENOUS

## 2019-04-06 MED ORDER — FENTANYL CITRATE (PF) 100 MCG/2ML IJ SOLN
25.0000 ug | INTRAMUSCULAR | Status: DC | PRN
  Administered 2019-04-06 (×3): 25 ug via INTRAVENOUS

## 2019-04-06 MED ORDER — PROPOFOL 10 MG/ML IV BOLUS
INTRAVENOUS | Status: AC
  Filled 2019-04-06: qty 20

## 2019-04-06 MED ORDER — HYDROMORPHONE HCL 2 MG PO TABS
4.0000 mg | ORAL_TABLET | ORAL | Status: DC | PRN
  Administered 2019-04-06 – 2019-04-07 (×3): 4 mg via ORAL
  Filled 2019-04-06 (×4): qty 2

## 2019-04-06 MED ORDER — CLINDAMYCIN PHOSPHATE 900 MG/50ML IV SOLN
INTRAVENOUS | Status: AC
  Filled 2019-04-06: qty 50

## 2019-04-06 MED ORDER — FAMOTIDINE 20 MG PO TABS: 20.0000 mg | ORAL_TABLET | Freq: Once | ORAL | Status: AC

## 2019-04-06 MED ORDER — MIDAZOLAM HCL 2 MG/2ML IJ SOLN
INTRAMUSCULAR | Status: AC
  Filled 2019-04-06: qty 2

## 2019-04-06 MED ORDER — EPHEDRINE SULFATE 50 MG/ML IJ SOLN
INTRAMUSCULAR | Status: DC | PRN
  Administered 2019-04-06: 5 mg via INTRAVENOUS

## 2019-04-06 MED ORDER — DEXMEDETOMIDINE HCL 200 MCG/2ML IV SOLN
INTRAVENOUS | Status: DC | PRN
  Administered 2019-04-06: 8 ug via INTRAVENOUS

## 2019-04-06 MED ORDER — LIDOCAINE HCL (CARDIAC) PF 100 MG/5ML IV SOSY
PREFILLED_SYRINGE | INTRAVENOUS | Status: DC | PRN
  Administered 2019-04-06: 60 mg via INTRAVENOUS

## 2019-04-06 MED ORDER — MORPHINE SULFATE (PF) 2 MG/ML IV SOLN
1.0000 mg | INTRAVENOUS | Status: DC | PRN
  Administered 2019-04-06 – 2019-04-07 (×13): 2 mg via INTRAVENOUS
  Filled 2019-04-06 (×13): qty 1

## 2019-04-06 MED ORDER — DEXAMETHASONE SODIUM PHOSPHATE 10 MG/ML IJ SOLN
INTRAMUSCULAR | Status: AC
  Filled 2019-04-06: qty 1

## 2019-04-06 MED ORDER — ALUM & MAG HYDROXIDE-SIMETH 200-200-20 MG/5ML PO SUSP: 30.0000 mL | ORAL | Status: DC | PRN

## 2019-04-06 MED ORDER — ONDANSETRON HCL 4 MG PO TABS: 4.0000 mg | ORAL_TABLET | Freq: Four times a day (QID) | ORAL | Status: DC | PRN

## 2019-04-06 MED ORDER — ACETAMINOPHEN 325 MG PO TABS
650.0000 mg | ORAL_TABLET | ORAL | Status: DC | PRN
  Administered 2019-04-06 – 2019-04-08 (×8): 650 mg via ORAL
  Filled 2019-04-06 (×8): qty 2

## 2019-04-06 MED ORDER — ROCURONIUM BROMIDE 100 MG/10ML IV SOLN
INTRAVENOUS | Status: DC | PRN
  Administered 2019-04-06: 50 mg via INTRAVENOUS

## 2019-04-06 MED ORDER — SUGAMMADEX SODIUM 200 MG/2ML IV SOLN
INTRAVENOUS | Status: DC | PRN
  Administered 2019-04-06: 150 mg via INTRAVENOUS

## 2019-04-06 MED ORDER — DIPHENHYDRAMINE HCL 50 MG/ML IJ SOLN
INTRAMUSCULAR | Status: AC
  Filled 2019-04-06: qty 1

## 2019-04-06 SURGICAL SUPPLY — 46 items
CANISTER SUCT 1200ML W/VALVE (MISCELLANEOUS) ×2 IMPLANT
CHLORAPREP W/TINT 26 (MISCELLANEOUS) ×2 IMPLANT
COUNTER NEEDLE 20/40 LG (NEEDLE) ×1 IMPLANT
COVER WAND RF STERILE (DRAPES) IMPLANT
DERMABOND ADVANCED (GAUZE/BANDAGES/DRESSINGS) ×1
DERMABOND ADVANCED .7 DNX12 (GAUZE/BANDAGES/DRESSINGS) IMPLANT
DRAPE LAPAROTOMY 100X77 ABD (DRAPES) IMPLANT
DRAPE LAPAROTOMY TRNSV 106X77 (MISCELLANEOUS) ×2 IMPLANT
DRSG TELFA 3X8 NADH (GAUZE/BANDAGES/DRESSINGS) IMPLANT
ELECT CAUTERY BLADE 6.4 (BLADE) IMPLANT
ELECT REM PT RETURN 9FT ADLT (ELECTROSURGICAL) ×2
ELECTRODE REM PT RTRN 9FT ADLT (ELECTROSURGICAL) ×1 IMPLANT
GAUZE SPONGE 4X4 12PLY STRL (GAUZE/BANDAGES/DRESSINGS) IMPLANT
GLOVE BIO SURGEON STRL SZ7 (GLOVE) ×2 IMPLANT
GLOVE INDICATOR 7.5 STRL GRN (GLOVE) ×2 IMPLANT
GOWN STRL REUS W/ TWL LRG LVL3 (GOWN DISPOSABLE) ×2 IMPLANT
GOWN STRL REUS W/TWL LRG LVL3 (GOWN DISPOSABLE) ×2
NS IRRIG 1000ML POUR BTL (IV SOLUTION) ×2 IMPLANT
PACK BASIN MAJOR ARMC (MISCELLANEOUS) ×2 IMPLANT
PAD DRESSING TELFA 3X8 NADH (GAUZE/BANDAGES/DRESSINGS) IMPLANT
PAD OB MATERNITY 4.3X12.25 (PERSONAL CARE ITEMS) ×2 IMPLANT
PENCIL SMOKE ULTRAEVAC 22 CON (MISCELLANEOUS) IMPLANT
RETRACTOR WND ALEXIS-O 25 LRG (MISCELLANEOUS) IMPLANT
RETRACTOR WOUND ALXS 18CM MED (MISCELLANEOUS) IMPLANT
RTRCTR C-SECT PINK 34CM XLRG (MISCELLANEOUS) IMPLANT
RTRCTR WOUND ALEXIS O 18CM MED (MISCELLANEOUS) ×2
RTRCTR WOUND ALEXIS O 25CM LRG (MISCELLANEOUS)
SOL PREP PVP 2OZ (MISCELLANEOUS) ×2
SOLUTION PREP PVP 2OZ (MISCELLANEOUS) ×1 IMPLANT
SPONGE LAP 18X18 RF (DISPOSABLE) ×4 IMPLANT
STRAP SAFETY 5IN WIDE (MISCELLANEOUS) ×2 IMPLANT
STRIP CLOSURE SKIN 1/2X4 (GAUZE/BANDAGES/DRESSINGS) IMPLANT
SUT MNCRL 4-0 (SUTURE) ×1
SUT MNCRL 4-0 27XMFL (SUTURE) ×1
SUT MNCRL AB 4-0 PS2 18 (SUTURE) IMPLANT
SUT PDS AB 1 TP1 96 (SUTURE) ×1 IMPLANT
SUT VIC AB 0 CT1 27 (SUTURE)
SUT VIC AB 0 CT1 27XCR 8 STRN (SUTURE) IMPLANT
SUT VIC AB 0 CT2 27 (SUTURE) ×1 IMPLANT
SUT VIC AB 0 CTX 36 (SUTURE)
SUT VIC AB 0 CTX36XBRD ANBCTRL (SUTURE) IMPLANT
SUT VIC AB 2-0 CT1 36 (SUTURE) IMPLANT
SUTURE MNCRL 4-0 27XMF (SUTURE) IMPLANT
SYR CONTROL 10ML LL (SYRINGE) IMPLANT
TOWEL OR 17X26 4PK STRL BLUE (TOWEL DISPOSABLE) ×2 IMPLANT
TRAY FOLEY MTR SLVR 16FR STAT (SET/KITS/TRAYS/PACK) ×2 IMPLANT

## 2019-04-06 NOTE — Transfer of Care (Signed)
Immediate Anesthesia Transfer of Care Note  Patient: Kathleen Owen  Procedure(s) Performed: MYOMECTOMY VIA LAPAROTOMY (N/A )  Patient Location: PACU  Anesthesia Type:General  Level of Consciousness: drowsy, patient cooperative and responds to stimulation  Airway & Oxygen Therapy: Patient Spontanous Breathing and Patient connected to face mask oxygen  Post-op Assessment: Report given to RN and Post -op Vital signs reviewed and stable  Post vital signs: Reviewed and stable  Last Vitals:  Vitals Value Taken Time  BP 99/63 04/06/19 1351  Temp 36 C 04/06/19 1351  Pulse 94 04/06/19 1356  Resp 15 04/06/19 1356  SpO2 100 % 04/06/19 1356  Vitals shown include unvalidated device data.  Last Pain:  Vitals:   04/06/19 1351  TempSrc:   PainSc: Asleep         Complications: No apparent anesthesia complications

## 2019-04-06 NOTE — H&P (Signed)
Date of Initial H&P: 03/31/2019  History reviewed, patient examined, no change in status, stable for surgery.

## 2019-04-06 NOTE — Op Note (Addendum)
Preoperative Diagnosis: 1) 29 y.o. [id number] with uterine fibroid 2) Recurrent miscarriages  Postoperative Diagnosis: 1) 29 y.o. [id number] with uterine fibroid 2) Recurrent miscarriages  Operation Performed: Myomectomy with pfannenstiel laparotomy  Indication: Recurrent miscarriages with negative work up to date and known uterine fibroid as only etiology not yet addressed  Anesthesia: General  Primary Surgeon: [name], MD  Assistant: Charlesetta Garibaldi, MD this surgery required a high level surgical assistant with none other readily available  Preoperative Antibiotics: Gentamycin and clindamycin  Estimated Blood Loss: 25 mL  IV Fluids: 829m  Drains or Tubes: Foley to gravity drainage  Implants: none  Specimens Removed: intramural leioma  Complications: none  Intraoperative Findings:  Normal tubes and ovaries.  No evidence of endometriosis or pelvic congestion syndrome.  Single intramural fibroid left fundus.   Patient Condition: stable  Procedure in Detail:  Patient was taken to the operating room were she was administered general endotracheal anesthesia.  She was positioned in the supine position, prepped and draped in the  Usual sterile fashion.  Prior to poceeding with the case a time out was performed.  Utilizing the scalpel a pfannenstiel skin incision was made 2cm above the pubic symphysis and carried down sharply to the the level of the rectus fascia.  The fascia was incised in the midline using the scalpel and then extended using mayo scissors.  The superior border of the rectus fascia was grasped with two Kocher clamps and the underlying rectus muscles were dissected of the fascia using blunt dissection.  The median raphae was incised using Mayo scissors.   The inferior border of the rectus fascia was dissected of the rectus muscles in a similar fashion.  The midline was identified, the peritoneum was entered bluntly and expanded using manual tractions.  A  medium self retaining Alexis retractor was placed. The moist laps were used to pack the bowl out of the pelvis.  The uterus was noted to be relatively small in size with a single intramural fundal fibroid approximately 4cm in size.  Ovaries and tubes looked grossly normal.   The serosal surface overlying the fibroid was injected with dilute vasopressin.  The serosal surface was incised using scalpel.  The fibroid was shelled out using a combination of Metzenbaum scissors and blunt dissection.  The cavity was not entered during resection of the fibroids.  Following successful excision of the fibroid the resulting defect was closed using several layer of 0 Vicryl in a running fashion.  The uterine serosa was closed with a baseball stitch of 4-0 Monocryl.  The rectus muscles were inspected noted to be hemostatic.  The fascia was closed using a looped #1 PDS in a running fashion taking 1cm by 1cm bites.  The subcutaneous tissue was irrigated using warm saline, hemostasis achieved using the bovie.  The subcutaneous dead space was less than 3cm and was not closed.  The skin was closed using 4-0 Monocryl in a subcuticular fashion.  Sponge needle and instrument counts were corrects times two.  The patient tolerated the procedure well and was taken to the recovery room in stable condition.

## 2019-04-06 NOTE — Anesthesia Preprocedure Evaluation (Signed)
Anesthesia Evaluation  Patient identified by MRN, date of birth, ID band Patient awake    Reviewed: Allergy & Precautions, H&P , NPO status , Patient's Chart, lab work & pertinent test results  History of Anesthesia Complications Negative for: history of anesthetic complications  Airway Mallampati: III  TM Distance: >3 FB Neck ROM: full    Dental  (+) Chipped, Poor Dentition   Pulmonary neg shortness of breath, asthma , neg recent URI, Current Smoker and Patient abstained from smoking.,           Cardiovascular Exercise Tolerance: Good (-) angina(-) Past MI and (-) DOE negative cardio ROS       Neuro/Psych PSYCHIATRIC DISORDERS negative neurological ROS  negative psych ROS   GI/Hepatic negative GI ROS, Neg liver ROS, neg GERD  ,  Endo/Other  negative endocrine ROS  Renal/GU      Musculoskeletal   Abdominal   Peds  Hematology negative hematology ROS (+)   Anesthesia Other Findings Past Medical History: 07/30/2014: Airway hyperreactivity August 2016: Bacterial vaginosis     Comment:  recurrent BV 07/30/2014: CD (Crohn's disease) ([place]) Jan 2016: Crohn's disease ([place]) No date: Depression 07/18/2014: Miscarriage  Past Surgical History: No date: DILATION AND CURETTAGE OF UTERUS 12/02/2016: DILATION AND EVACUATION; N/A     Comment:  Procedure: DILATATION AND EVACUATION;  Surgeon: Harlin Heys, MD;  Location: [facility] ORS;  Service:               Gynecology;  Laterality: N/A; 06/30/2017: DILATION AND EVACUATION; N/A     Comment:  Procedure: DILATATION AND EVACUATION;  Surgeon: Harlin Heys, MD;  Location: [facility] ORS;  Service:               Gynecology;  Laterality: N/A; No date: TONSILLECTOMY  BMI    Body Mass Index: 26.56 kg/m      Reproductive/Obstetrics negative OB ROS                             Anesthesia Physical  Anesthesia Plan  ASA:  III  Anesthesia Plan: General ETT   Post-op Pain Management:    Induction: Intravenous  PONV Risk Score and Plan: 3 and Dexamethasone, Ondansetron, Midazolam and Treatment may vary due to age or medical condition  Airway Management Planned: Oral ETT  Additional Equipment:   Intra-op Plan:   Post-operative Plan: Extubation in OR  Informed Consent: I have reviewed the patients History and Physical, chart, labs and discussed the procedure including the risks, benefits and alternatives for the proposed anesthesia with the patient or authorized representative who has indicated his/her understanding and acceptance.     Dental Advisory Given  Plan Discussed with: Anesthesiologist, CRNA and Surgeon  Anesthesia Plan Comments: (Patient consented for risks of anesthesia including but not limited to:  - adverse reactions to medications - damage to teeth, lips or other oral mucosa - sore throat or hoarseness - Damage to heart, brain, lungs or loss of life  Patient voiced understanding.)        Anesthesia Quick Evaluation

## 2019-04-06 NOTE — Op Note (Signed)
04/06/2019  2:25 PM  PATIENT:  Kathleen Owen  29 y.o. female  PRE-OPERATIVE DIAGNOSIS:  Intramural fibroid D25.1 Recurrent miscarriage N96  POST-OPERATIVE DIAGNOSIS:  intramural fibroid, recurrent miscarriage  PROCEDURE:  Procedure(s): MYOMECTOMY VIA LAPAROTOMY (N/A)  SURGEON:  Surgeon(s) and Role:    * [name], MD - Primary    * Schuman, Stefanie Libel, MD - Assisting  PHYSICIAN ASSISTANT: Schuman, Christanna  ASSISTANTS: none   ANESTHESIA:   general  EBL:  25 mL   BLOOD ADMINISTERED:none  DRAINS: none   SPECIMEN: uterine fibroid  DISPOSITION OF SPECIMEN:  PATHOLOGY  COUNTS:  YES  TOURNIQUET:  * No tourniquets in log *  DICTATION: . Op note to follow in [place]: Admit for overnight observation  PATIENT DISPOSITION:  PACU - hemodynamically stable.   Delay start of Pharmacological VTE agent (>24hrs) due to surgical blood loss or risk of bleeding: yes
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
## 2019-04-06 NOTE — Anesthesia Procedure Notes (Signed)
Procedure Name: Intubation Date/Time: 04/06/2019 12:14 PM Performed by: Kelton Pillar, CRNA Pre-anesthesia Checklist: Patient identified, Emergency Drugs available, Suction available and Patient being monitored Patient Re-evaluated:Patient Re-evaluated prior to induction Oxygen Delivery Method: Circle system utilized Preoxygenation: Pre-oxygenation with 100% oxygen Induction Type: IV induction Ventilation: Mask ventilation without difficulty Laryngoscope Size: McGraph and 3 Grade View: Grade I Tube type: Oral Tube size: 7.0 mm Number of attempts: 1 Airway Equipment and Method: Stylet and Oral airway Placement Confirmation: ETT inserted through vocal cords under direct vision,  positive ETCO2 and breath sounds checked- equal and bilateral Secured at: 22 cm Tube secured with: Tape Dental Injury: Teeth and Oropharynx as per pre-operative assessment

## 2019-04-06 NOTE — Progress Notes (Addendum)
   Subjective: Patient reports incisional pain.  Pain is not well-controlled on current  analgesic regimen..  Tolerating po: Yes   Objective: Vital signs in last 24 hours: Temp:  [96.8 F (36 C)-98 F (36.7 C)] 97.8 F (36.6 C) (02/11 1917) Pulse Rate:  [65-100] 65 (02/11 1917) Resp:  [14-23] 16 (02/11 1706) BP: (93-103)/(59-68) 100/66 (02/11 1917) SpO2:  [96 %-100 %] 98 % (02/11 1917) Weight:  [62.6 kg] 62.6 kg (02/11 1548)    Intake/Output from previous day: No intake/output data recorded.  Physical Examination: General: NAD Pulmonary: no increased work of breathing Abdomen: soft, non-tender, non-distended, incision D/C/I Extremities: no edema Neurologic: normal gait   Labs: Results for orders placed or performed during the hospital encounter of 04/06/19 (from the past 72 hour(s))  Pregnancy, urine POC     Status: None   Collection Time: 04/06/19 10:27 AM  Result Value Ref Range   Preg Test, Ur NEGATIVE NEGATIVE    Comment:        THE SENSITIVITY OF THIS METHODOLOGY IS >24 mIU/mL      Assessment:  29 y.o. s/p Day of Surgery Procedure(s) (LRB): MYOMECTOMY VIA LAPAROTOMY (N/A) : stable  Plan: 1) Pain: switched to po dilaudid from percocet with addition of po tylenol.  Morphine pushes 16m q1hrs.   - will add k-pad - discussed PCA if continued issues with pain control  2) Heme: repeat CBC in Am  3) FEN: regular diet  4) Prophylaxis: SCD.  5) Disposition: The patient is to be discharged to home, anticipate discharge POD2.  AMalachy Mood MD, [facility] 04/06/2019, 10:46 PM

## 2019-04-07 LAB — BASIC METABOLIC PANEL
Anion gap: 9 (ref 5–15)
BUN: 6 mg/dL (ref 6–20)
CO2: 24 mmol/L (ref 22–32)
Calcium: 8.7 mg/dL — ABNORMAL LOW (ref 8.9–10.3)
Chloride: 106 mmol/L (ref 98–111)
Creatinine, Ser: 0.5 mg/dL (ref 0.44–1.00)
GFR calc Af Amer: >60 mL/min (ref >60)
GFR calc non Af Amer: >60 mL/min (ref >60)
Glucose, Bld: 126 mg/dL — ABNORMAL HIGH (ref 70–99)
Potassium: 3.5 mmol/L (ref 3.5–5.1)
Sodium: 139 mmol/L (ref 135–145)

## 2019-04-07 LAB — CBC
HCT: 37.7 % (ref 36.0–46.0)
Hemoglobin: 12.7 g/dL (ref 12.0–15.0)
MCH: 30.8 pg (ref 26.0–34.0)
MCHC: 33.7 g/dL (ref 30.0–36.0)
MCV: 91.3 fL (ref 80.0–100.0)
Platelets: 189 10*3/uL (ref 150–400)
RBC: 4.13 MIL/uL (ref 3.87–5.11)
RDW: 13.8 % (ref 11.5–15.5)
WBC: 22.2 10*3/uL — ABNORMAL HIGH (ref 4.0–10.5)
nRBC: 0 % (ref 0.0–0.2)

## 2019-04-07 LAB — SURGICAL PATHOLOGY

## 2019-04-07 MED ORDER — HYDROMORPHONE 1 MG/ML IV SOLN
INTRAVENOUS | Status: DC
  Administered 2019-04-07 (×2): 25 mg via INTRAVENOUS
  Administered 2019-04-08: 2.7 mL via INTRAVENOUS
  Administered 2019-04-08: 3.9 mL via INTRAVENOUS
  Administered 2019-04-08 (×2): 25 mg via INTRAVENOUS
  Filled 2019-04-07: qty 25

## 2019-04-07 MED ORDER — DIPHENHYDRAMINE HCL 12.5 MG/5ML PO ELIX
12.5000 mg | ORAL_SOLUTION | Freq: Four times a day (QID) | ORAL | Status: DC | PRN
  Filled 2019-04-07: qty 5

## 2019-04-07 MED ORDER — GUAIFENESIN ER 600 MG PO TB12
1200.0000 mg | ORAL_TABLET | Freq: Two times a day (BID) | ORAL | Status: DC | PRN
  Administered 2019-04-07 – 2019-04-09 (×4): 1200 mg via ORAL
  Filled 2019-04-07 (×6): qty 2

## 2019-04-07 MED ORDER — HYDROMORPHONE 1 MG/ML IV SOLN
INTRAVENOUS | Status: DC
  Filled 2019-04-07 (×4): qty 30

## 2019-04-07 MED ORDER — NALOXONE HCL 0.4 MG/ML IJ SOLN: 0.4000 mg | INTRAMUSCULAR | Status: DC | PRN

## 2019-04-07 MED ORDER — DIPHENHYDRAMINE HCL 50 MG/ML IJ SOLN: 12.5000 mg | Freq: Four times a day (QID) | INTRAMUSCULAR | Status: DC | PRN

## 2019-04-07 MED ORDER — SODIUM CHLORIDE 0.9% FLUSH: 9.0000 mL | INTRAVENOUS | Status: DC | PRN

## 2019-04-07 NOTE — Anesthesia Postprocedure Evaluation (Signed)
Anesthesia Post Note  Patient: Kathleen Owen  Procedure(s) Performed: MYOMECTOMY VIA LAPAROTOMY (N/A )  Patient location during evaluation: PACU Anesthesia Type: General Level of consciousness: awake and alert Pain management: pain level controlled Vital Signs Assessment: post-procedure vital signs reviewed and stable Respiratory status: spontaneous breathing, nonlabored ventilation, respiratory function stable and patient connected to nasal cannula oxygen Cardiovascular status: blood pressure returned to baseline and stable Postop Assessment: no apparent nausea or vomiting Anesthetic complications: no     Last Vitals:  Vitals:   04/07/19 0427 04/07/19 0729  BP: 97/63 (!) 104/56  Pulse: 71 (!) 58  Resp:  18  Temp: 36.8 C 37 C  SpO2: 99% 98%    Last Pain:  Vitals:   04/07/19 0740  TempSrc:   PainSc: 7                  Martha Clan

## 2019-04-07 NOTE — Progress Notes (Signed)
   Subjective: Patient reports incisional pain and + flatus.  Pain is not well-controlled on current  analgesic regimen, continues to report abdominal pain.  Tolerating po: Yes   Objective: Vital signs in last 24 hours: Temp:  [96.8 F (36 C)-98.6 F (37 C)] 98.6 F (37 C) (02/12 0729) Pulse Rate:  [58-100] 58 (02/12 0729) Resp:  [14-23] 18 (02/12 0729) BP: (93-104)/(56-68) 104/56 (02/12 0729) SpO2:  [95 %-100 %] 98 % (02/12 0729) Weight:  [62.6 kg] 62.6 kg (02/11 1548)    Intake/Output from previous day: 02/11 0701 - 02/12 0700 In: 2776 [P.O.:50; I.V.:2726] Out: 2725 [Urine:2700; Blood:25]  Physical Examination: General: NAD, well nourished, appears stated age Pulmonary: no increased work of breathing Abdomen: NABS, soft, non-tender, non-distended, incision D/C/I Extremities: no edema Neurologic: normal gait   Labs: Results for orders placed or performed during the hospital encounter of 04/06/19 (from the past 72 hour(s))  Pregnancy, urine POC     Status: None   Collection Time: 04/06/19 10:27 AM  Result Value Ref Range   Preg Test, Ur NEGATIVE NEGATIVE    Comment:        THE SENSITIVITY OF THIS METHODOLOGY IS >24 mIU/mL      Assessment:  29 y.o. s/p 1 Day Post-Op Procedure(s) (LRB): MYOMECTOMY VIA LAPAROTOMY (N/A) : stable  Plan: 1) Pain: Continue po dilaudid, tylenol, and morphine pushes prn.  Continue K-pad, add abdominal binder.  Discussed given the nature of laparotomy she should expect pain over the next few weeks.  2) Heme: AM CBC pending, hemodynamically stable and afebrile.    3) FEN: regular diet - Decrease maintenance fluid rate from 127m/hr to 760mhr  4) Prophylaxis: intermittent pneumatic compression boots.  5) Disposition: The patient is to be discharged to anticipate discharge home PONorth PearsallMD, FAEastpointeCoSultana/01/2020, 7:52 AM

## 2019-04-07 NOTE — Progress Notes (Addendum)
Still reports inadequate pain control.  Will switch to dilaudid PCA as previously discussed with patient.  Care is complicated by chronic pain as well as Crohn's disease.

## 2019-04-08 LAB — CBC
HCT: 35.7 % — ABNORMAL LOW (ref 36.0–46.0)
Hemoglobin: 11.7 g/dL — ABNORMAL LOW (ref 12.0–15.0)
MCH: 30.4 pg (ref 26.0–34.0)
MCHC: 32.8 g/dL (ref 30.0–36.0)
MCV: 92.7 fL (ref 80.0–100.0)
Platelets: 189 10*3/uL (ref 150–400)
RBC: 3.85 MIL/uL — ABNORMAL LOW (ref 3.87–5.11)
RDW: 14 % (ref 11.5–15.5)
WBC: 17.4 10*3/uL — ABNORMAL HIGH (ref 4.0–10.5)
nRBC: 0 % (ref 0.0–0.2)

## 2019-04-08 MED ORDER — LIDOCAINE 5 % EX PTCH
1.0000 | MEDICATED_PATCH | CUTANEOUS | Status: DC
  Administered 2019-04-08 – 2019-04-09 (×3): 1 via TRANSDERMAL
  Filled 2019-04-08 (×3): qty 1

## 2019-04-08 MED ORDER — OXYCODONE-ACETAMINOPHEN 5-325 MG PO TABS
1.0000 | ORAL_TABLET | ORAL | Status: DC | PRN
  Administered 2019-04-08 – 2019-04-09 (×8): 2 via ORAL
  Filled 2019-04-08 (×8): qty 2

## 2019-04-08 MED ORDER — ZOLPIDEM TARTRATE 5 MG PO TABS: 5.0000 mg | ORAL_TABLET | Freq: Every evening | ORAL | Status: DC | PRN

## 2019-04-08 MED ORDER — GABAPENTIN 600 MG PO TABS
300.0000 mg | ORAL_TABLET | Freq: Two times a day (BID) | ORAL | Status: DC
  Administered 2019-04-08 – 2019-04-09 (×3): 300 mg via ORAL
  Filled 2019-04-08 (×4): qty 0.5

## 2019-04-08 MED ORDER — DOCUSATE SODIUM 100 MG PO CAPS
100.0000 mg | ORAL_CAPSULE | Freq: Two times a day (BID) | ORAL | Status: DC
  Administered 2019-04-08 – 2019-04-09 (×3): 100 mg via ORAL
  Filled 2019-04-08 (×3): qty 1

## 2019-04-08 NOTE — Progress Notes (Signed)
RN and NT have encouraged patient to ambulate in hall throughout the shift. RN has explained importance of ambulation but patient has refused every time. RN will continue encouragement.

## 2019-04-08 NOTE — Progress Notes (Signed)
Subjective: Patient reports incisional pain, tolerating PO, + flatus and no problems voiding.  Pain is not well-controlled on current  analgesic regimen..  Tolerating po: Yes   Objective: Vital signs in last 24 hours: Temp:  [97.9 F (36.6 C)-98.4 F (36.9 C)] 97.9 F (36.6 C) (02/13 0836) Pulse Rate:  [48-69] 65 (02/13 0836) Resp:  [12-18] 16 (02/13 0836) BP: (93-115)/(54-74) 93/54 (02/13 0836) SpO2:  [95 %-100 %] 100 % (02/13 0836)    Intake/Output from previous day: 02/12 0701 - 02/13 0700 In: 2424.2 [I.V.:2424.2] Out: 1450 [Urine:1450]  Physical Examination: General: NAD, well nourished, appears stated age Pulmonary: no increased work of breathing Abdomen: soft, non-tender, non-distended, incision D/C/I Extremities: no edema    Labs: Results for orders placed or performed during the hospital encounter of 04/06/19 (from the past 72 hour(s))  Pregnancy, urine POC     Status: None   Collection Time: 04/06/19 10:27 AM  Result Value Ref Range   Preg Test, Ur NEGATIVE NEGATIVE    Comment:        THE SENSITIVITY OF THIS METHODOLOGY IS >24 mIU/mL   Surgical pathology     Status: None   Collection Time: 04/06/19  1:10 PM  Result Value Ref Range   SURGICAL PATHOLOGY      SURGICAL PATHOLOGY CASE: [id number] PATIENT: Kathleen Owen Surgical Pathology Report     Specimen Submitted: A. Intramural fibroid  Clinical History: Intramural fibroid, recurrent miscarriage      DIAGNOSIS: A. INTRAMURAL FIBROID; MYOMECTOMY: - FRAGMENTS OF BENIGN SMOOTH MUSCLE, CONSISTENT WITH LEIOMYOMA. - NEGATIVE FOR ATYPIA AND MALIGNANCY.   GROSS DESCRIPTION: A. Labeled: Intramural fibroid Received: In formalin Weight: 17 g Size: 3.5 x 3.0 x 2.5 cm Description: Whorled tan/white soft tissue without hemorrhage or necrosis Representatively submitted in 2 cassettes.   Final Diagnosis performed by Betsy Pries, MD.   Electronically signed 04/07/2019 10:07:30AM The  electronic signature indicates that the named Attending Pathologist has evaluated the specimen Technical component performed at [facility], [address] [place], [place] [address] Lab: [phone] Dir: Rush Farmer, MD, MMM  Professional component performed at [facility], [facility], [address] [place], [place], [place] [address] Lab: [phone] Dir: Dellia Nims. Rubinas, MD   CBC     Status: Abnormal   Collection Time: 04/07/19  7:26 AM  Result Value Ref Range   WBC 22.2 (H) 4.0 - 10.5 K/uL   RBC 4.13 3.87 - 5.11 MIL/uL   Hemoglobin 12.7 12.0 - 15.0 g/dL   HCT 37.7 36.0 - 46.0 %   MCV 91.3 80.0 - 100.0 fL   MCH 30.8 26.0 - 34.0 pg   MCHC 33.7 30.0 - 36.0 g/dL   RDW 13.8 11.5 - 15.5 %   Platelets 189 150 - 400 K/uL   nRBC 0.0 0.0 - 0.2 %    Comment: Performed at [facility], [address] [place], [place] [address]  Basic metabolic panel     Status: Abnormal   Collection Time: 04/07/19  7:26 AM  Result Value Ref Range   Sodium 139 135 - 145 mmol/L   Potassium 3.5 3.5 - 5.1 mmol/L   Chloride 106 98 - 111 mmol/L   CO2 24 22 - 32 mmol/L   Glucose, Bld 126 (H) 70 - 99 mg/dL   BUN 6 6 - 20 mg/dL   Creatinine, Ser 0.50 0.44 - 1.00 mg/dL   Calcium 8.7 (L) 8.9 - 10.3 mg/dL   GFR calc non Af Amer >60 >60  mL/min   GFR calc Af Amer >60 >60 mL/min   Anion gap 9 5 - 15    Comment: Performed at [facility], [address] [place], [place] [address]  CBC     Status: Abnormal   Collection Time: 04/08/19  8:17 AM  Result Value Ref Range   WBC 17.4 (H) 4.0 - 10.5 K/uL   RBC 3.85 (L) 3.87 - 5.11 MIL/uL   Hemoglobin 11.7 (L) 12.0 - 15.0 g/dL   HCT 35.7 (L) 36.0 - 46.0 %   MCV 92.7 80.0 - 100.0 fL   MCH 30.4 26.0 - 34.0 pg   MCHC 32.8 30.0 - 36.0 g/dL   RDW 14.0 11.5 - 15.5 %   Platelets 189 150 - 400 K/uL   nRBC 0.0 0.0 - 0.2 %    Comment: Performed at [facility], [address] [place], [place] [address]     Assessment:  29 y.o.  s/p 2 Days Post-Op Procedure(s) (LRB): MYOMECTOMY VIA LAPAROTOMY (N/A) : stable  Plan: 1) Pain: tried to set realistic expectations as to pain to expect postoperatively.  Her pulse has gotten as low as 44 and respirations 12 with still reports of 9/10 pain although the patient looks comfortable this morning - Continue K-pad, abdominal binder prn - Add lidocaine patch - Add gabapentin for neuropathic component (mostly sharp shooting pain at site of fascial stitch) - Discontinue PCA switch back to percocet - Add colace  - Ambien 30m prn sleep  2) Heme: hemodynamically stable  3) FEN: Tolerating general diet  4) Prophylaxis: SCDs, ambulation.  5) Disposition: Anticipate further work on pain control today and discharge tomorrow  AMalachy Mood MD, [facility] 04/08/2019, 9:31 AM

## 2019-04-09 MED ORDER — GABAPENTIN 600 MG PO TABS: 300.0000 mg | ORAL_TABLET | Freq: Two times a day (BID) | ORAL | 0 refills | Status: DC

## 2019-04-09 MED ORDER — SIMETHICONE 80 MG PO CHEW
80.0000 mg | CHEWABLE_TABLET | Freq: Four times a day (QID) | ORAL | Status: DC | PRN
  Administered 2019-04-09: 80 mg via ORAL
  Filled 2019-04-09: qty 1

## 2019-04-09 MED ORDER — LIDOCAINE 5 % EX PTCH: 1.0000 | MEDICATED_PATCH | CUTANEOUS | 0 refills | Status: DC

## 2019-04-09 MED ORDER — LIDOCAINE-PRILOCAINE 2.5-2.5 % EX CREA: 1.0000 "application " | TOPICAL_CREAM | CUTANEOUS | 0 refills | Status: DC | PRN

## 2019-04-09 MED ORDER — OXYCODONE-ACETAMINOPHEN 5-325 MG PO TABS: 1.0000 | ORAL_TABLET | ORAL | 0 refills | Status: DC | PRN

## 2019-04-09 NOTE — Discharge Summary (Signed)
Physician Discharge Summary  Patient ID: Kathleen Owen MRN: [id number] DOB/AGE: 30 21-Jul-1990 30 y.o.  Admit date: 04/06/2019 Discharge date: 04/09/2019  Admission Diagnoses:  Discharge Diagnoses:  Active Problems:   Uterine fibroid   Discharged Condition: good  Hospital Course: 29 y.o. admitted for scheduled myomectomy via laparotomy.  Surgery underwent without complications.  Postoperative course benign other than pain control. Patient was transitioned to Dilaudid PCA on POD1, transitioned to oral analgesic on POD2.  Addition of gabapentin for fascial pain, as well as lidocaine patch.  Patient was ambulating, voiding, tolerating po by the time of discharge and remained hemodynamically stable and afebrile throughout admission  Consults: None  Significant Diagnostic Studies:  Results for orders placed or performed during the hospital encounter of 04/06/19 (from the past 72 hour(s))  Surgical pathology     Status: None   Collection Time: 04/06/19  1:10 PM  Result Value Ref Range   SURGICAL PATHOLOGY      SURGICAL PATHOLOGY CASE: [id number] PATIENT: Kathleen Owen Surgical Pathology Report     Specimen Submitted: A. Intramural fibroid  Clinical History: Intramural fibroid, recurrent miscarriage      DIAGNOSIS: A. INTRAMURAL FIBROID; MYOMECTOMY: - FRAGMENTS OF BENIGN SMOOTH MUSCLE, CONSISTENT WITH LEIOMYOMA. - NEGATIVE FOR ATYPIA AND MALIGNANCY.   GROSS DESCRIPTION: A. Labeled: Intramural fibroid Received: In formalin Weight: 17 g Size: 3.5 x 3.0 x 2.5 cm Description: Whorled tan/white soft tissue without hemorrhage or necrosis Representatively submitted in 2 cassettes.   Final Diagnosis performed by Betsy Pries, MD.   Electronically signed 04/07/2019 10:07:30AM The electronic signature indicates that the named Attending Pathologist has evaluated the specimen Technical component performed at [facility], [address], [place], [place] [address] Lab:  [phone] Dir: Rush Farmer, MD, MMM  Professional component performed at [facility], [facility], [place], [place], [place] [address] Lab: [phone] Dir: Dellia Nims. Rubinas, MD   CBC     Status: Abnormal   Collection Time: 04/07/19  7:26 AM  Result Value Ref Range   WBC 22.2 (H) 4.0 - 10.5 K/uL   RBC 4.13 3.87 - 5.11 MIL/uL   Hemoglobin 12.7 12.0 - 15.0 g/dL   HCT 37.7 36.0 - 46.0 %   MCV 91.3 80.0 - 100.0 fL   MCH 30.8 26.0 - 34.0 pg   MCHC 33.7 30.0 - 36.0 g/dL   RDW 13.8 11.5 - 15.5 %   Platelets 189 150 - 400 K/uL   nRBC 0.0 0.0 - 0.2 %    Comment: Performed at [facility], [address] [place], [place] [address]  Basic metabolic panel     Status: Abnormal   Collection Time: 04/07/19  7:26 AM  Result Value Ref Range   Sodium 139 135 - 145 mmol/L   Potassium 3.5 3.5 - 5.1 mmol/L   Chloride 106 98 - 111 mmol/L   CO2 24 22 - 32 mmol/L   Glucose, Bld 126 (H) 70 - 99 mg/dL   BUN 6 6 - 20 mg/dL   Creatinine, Ser 0.50 0.44 - 1.00 mg/dL   Calcium 8.7 (L) 8.9 - 10.3 mg/dL   GFR calc non Af Amer >60 >60 mL/min   GFR calc Af Amer >60 >60 mL/min   Anion gap 9 5 - 15    Comment: Performed at [facility], [address], [place], [place] [address]  CBC     Status: Abnormal   Collection Time: 04/08/19  8:17 AM  Result Value Ref Range  WBC 17.4 (H) 4.0 - 10.5 K/uL   RBC 3.85 (L) 3.87 - 5.11 MIL/uL   Hemoglobin 11.7 (L) 12.0 - 15.0 g/dL   HCT 35.7 (L) 36.0 - 46.0 %   MCV 92.7 80.0 - 100.0 fL   MCH 30.4 26.0 - 34.0 pg   MCHC 32.8 30.0 - 36.0 g/dL   RDW 14.0 11.5 - 15.5 %   Platelets 189 150 - 400 K/uL   nRBC 0.0 0.0 - 0.2 %    Comment: Performed at [facility], [address] [place], [place] [address]    Treatments: IV hydration and surgery: 04/06/2019 laparotomy and myomectomy  Discharge Exam: Blood pressure 96/61, pulse (!) 54, temperature 98.6 F (37 C), temperature source Oral, resp. rate 20, height 5'  2.99" (1.6 m), weight 62.6 kg, last menstrual period 03/27/2019, SpO2 99 %, unknown if currently breastfeeding. General appearance: alert, appears stated age and no distress Resp: no increased work of breathing GI: soft, non-distended, appropraitely tender around incision Extremities: extremities normal, atraumatic, no cyanosis or edema Incision/Wound: D/C/I suture line  Disposition: Discharge disposition: 01-Home or Self Care       Discharge Instructions    Call MD for:   Complete by: As directed    Heavy vaginal bleeding greater than 1 pad an hour   Call MD for:  difficulty breathing, headache or visual disturbances   Complete by: As directed    Call MD for:  extreme fatigue   Complete by: As directed    Call MD for:  hives   Complete by: As directed    Call MD for:  persistant dizziness or light-headedness   Complete by: As directed    Call MD for:  persistant nausea and vomiting   Complete by: As directed    Call MD for:  redness, tenderness, or signs of infection (pain, swelling, redness, odor or green/yellow discharge around incision site)   Complete by: As directed    Call MD for:  severe uncontrolled pain   Complete by: As directed    Call MD for:  temperature >100.4   Complete by: As directed    Diet general   Complete by: As directed    Discharge wound care:   Complete by: As directed    You may apply a light dressing for minor discharge from the incision or to keep waistbands of clothing from rubbing.  You may shower, use soap on your incision.  Avoid baths or soaking the incision in the first 6 weeks following your surgery..   Driving restriction   Complete by: As directed    Avoid driving for at least 2 weeks or while taking prescription narcotics.   Lifting restrictions   Complete by: As directed    Weight restriction of 10lbs for 6 weeks.     Allergies as of 04/09/2019      Reactions   Aspirin Other (See Comments)   Chron's Disease   Ibuprofen Other  (See Comments)   Chron's Disease   Amoxicillin Rash, Other (See Comments)   Has patient had a PCN reaction causing immediate rash, facial/tongue/throat swelling, SOB or lightheadedness with hypotension: Unknown Has patient had a PCN reaction causing severe rash involving mucus membranes or skin necrosis: Unknown Has patient had a PCN reaction that required hospitalization: Unknown Has patient had a PCN reaction occurring within the last 10 years: No If all of the above answers are "NO", then may proceed with Cephalosporin use.      Medication  List    STOP taking these medications   acetaminophen 500 MG tablet Commonly known as: TYLENOL     TAKE these medications   escitalopram 20 MG tablet Commonly known as: Lexapro Take 1 tablet (20 mg total) by mouth daily. What changed: when to take this   gabapentin 600 MG tablet Commonly known as: NEURONTIN Take 0.5 tablets (300 mg total) by mouth 2 (two) times daily.   lidocaine 5 % Commonly known as: LIDODERM Place 1 patch onto the skin daily. Remove & Discard patch within 12 hours or as directed by MD Start taking on: April 10, 2019   lidocaine-prilocaine cream Commonly known as: EMLA Apply 1 application topically as needed.   oxyCODONE-acetaminophen 5-325 MG tablet Commonly known as: PERCOCET/ROXICET Take 1-2 tablets by mouth every 4 (four) hours as needed for moderate pain or severe pain.   traZODone 50 MG tablet Commonly known as: DESYREL Take 1 tablet (50 mg total) by mouth at bedtime. What changed:   when to take this  reasons to take this            Discharge Care Instructions  (From admission, onward)         Start     Ordered   04/09/19 0000  Discharge wound care:    Comments: You may apply a light dressing for minor discharge from the incision or to keep waistbands of clothing from rubbing.  You may shower, use soap on your incision.  Avoid baths or soaking the incision in the first 6 weeks following  your surgery.Marland Kitchen   04/09/19 1028           Signed: [name] 04/09/2019, 10:32 AM

## 2019-04-09 NOTE — Discharge Instructions (Signed)
Please call your doctor or return to the ER if you experience any chest pains, shortness of breath, dizziness, visual changes, severe headache (unrelieved by pain meds), fever greater than 101, any heavy bleeding (saturating more than 1 pad per hour), large clots, or foul smelling discharge, any worsening abdominal pain and cramping that is not controlled by pain medication, or any calf/leg pain or redness, . No tampons, enemas, douches, or sexual intercourse for 6 weeks. Also avoid tub baths, hot tubs, or swimming for 6 weeks.

## 2019-04-09 NOTE — Progress Notes (Signed)
Discharge order received from doctor. Reviewed discharge instructions and prescriptions with patient and answered all questions. Follow up appointment given. Patient verbalized understanding. Patient discharged home via wheelchair by nursing/auxillary.    Hilbert Bible, RN

## 2019-04-14 ENCOUNTER — Other Ambulatory Visit: Payer: Self-pay

## 2019-04-14 ENCOUNTER — Ambulatory Visit (INDEPENDENT_AMBULATORY_CARE_PROVIDER_SITE_OTHER): Payer: Medicaid Other | Admitting: Obstetrics and Gynecology

## 2019-04-14 ENCOUNTER — Encounter: Payer: Self-pay | Admitting: Obstetrics and Gynecology

## 2019-04-14 VITALS — BP 112/76

## 2019-04-14 DIAGNOSIS — Z4889 Encounter for other specified surgical aftercare: Secondary | ICD-10-CM

## 2019-04-14 MED ORDER — OXYCODONE-ACETAMINOPHEN 5-325 MG PO TABS: 1.0000 | ORAL_TABLET | ORAL | 0 refills | Status: DC | PRN

## 2019-04-14 NOTE — Progress Notes (Signed)
Postoperative Follow-up Patient presents post op from myomectomy via laparotomy 1weeks ago for fibroids.  Subjective: Patient reports some improvement in her preop symptoms. Eating a regular diet without difficulty. Pain is controlled with current analgesics. Medications being used: percocet .  Activity: still pretty sore when ambulating.  Objective: Blood pressure 112/76, last menstrual period 03/27/2019, unknown if currently breastfeeding.  General: NAD Pulmonary: no increased work of breathing Abdomen: soft, non-tender, non-distended, incision D/C/I Extremities: no edema Neurologic: normal gait    Admission on 04/06/2019, Discharged on 04/09/2019  Component Date Value Ref Range Status  . Preg Test, Ur 04/06/2019 NEGATIVE  NEGATIVE Final   Comment:        THE SENSITIVITY OF THIS METHODOLOGY IS >24 mIU/mL   . SURGICAL PATHOLOGY 04/06/2019    Final-Edited                   Value:SURGICAL PATHOLOGY CASE: [id number] PATIENT: Kathleen Owen Surgical Pathology Report     Specimen Submitted: A. Intramural fibroid  Clinical History: Intramural fibroid, recurrent miscarriage      DIAGNOSIS: A. INTRAMURAL FIBROID; MYOMECTOMY: - FRAGMENTS OF BENIGN SMOOTH MUSCLE, CONSISTENT WITH LEIOMYOMA. - NEGATIVE FOR ATYPIA AND MALIGNANCY.   GROSS DESCRIPTION: A. Labeled: Intramural fibroid Received: In formalin Weight: 17 g Size: 3.5 x 3.0 x 2.5 cm Description: Whorled tan/white soft tissue without hemorrhage or necrosis Representatively submitted in 2 cassettes.   Final Diagnosis performed by Betsy Pries, MD.   Electronically signed 04/07/2019 10:07:30AM The electronic signature indicates that the named Attending Pathologist has evaluated the specimen Technical component performed at [facility], [address], [place], [place] [address] Lab: [phone] Dir: Rush Farmer, MD, MMM  Professional component performed at [facility], [facility], [place], [place], [place] [address] Lab: [phone] Dir: Dellia Nims. Rubinas, MD   . WBC 04/07/2019 22.2* 4.0 - 10.5 K/uL Final  . RBC 04/07/2019 4.13  3.87 - 5.11 MIL/uL Final  . Hemoglobin 04/07/2019 12.7  12.0 - 15.0 g/dL Final  . HCT 04/07/2019 37.7  36.0 - 46.0 % Final  . MCV 04/07/2019 91.3  80.0 - 100.0 fL Final  . MCH 04/07/2019 30.8  26.0 - 34.0 pg Final  . MCHC 04/07/2019 33.7  30.0 - 36.0 g/dL Final  . RDW 04/07/2019 13.8  11.5 - 15.5 % Final  . Platelets 04/07/2019 189  150 - 400 K/uL Final  . nRBC 04/07/2019 0.0  0.0 - 0.2 % Final   Performed at [facility], [address] [place], [place] [address]  . Sodium 04/07/2019 139  135 - 145 mmol/L Final  . Potassium 04/07/2019 3.5  3.5 - 5.1 mmol/L Final  . Chloride 04/07/2019 106  98 - 111 mmol/L Final  . CO2 04/07/2019 24  22 - 32 mmol/L Final  . Glucose, Bld 04/07/2019 126* 70 - 99 mg/dL Final  . BUN 04/07/2019 6  6 - 20 mg/dL Final  . Creatinine, Ser 04/07/2019 0.50  0.44 - 1.00 mg/dL Final  . Calcium 04/07/2019 8.7* 8.9 - 10.3 mg/dL Final  . GFR calc non Af Amer 04/07/2019 >60  >60 mL/min Final  . GFR calc Af Amer 04/07/2019 >60  >60 mL/min Final  . Anion gap 04/07/2019 9  5 - 15 Final   Performed at [facility], [address] [place], [place] [address]  . WBC 04/08/2019 17.4* 4.0 - 10.5 K/uL Final  . RBC 04/08/2019 3.85* 3.87 - 5.11  MIL/uL Final  . Hemoglobin 04/08/2019 11.7* 12.0 - 15.0 g/dL Final  . HCT 04/08/2019 35.7* 36.0 - 46.0 % Final  . MCV 04/08/2019 92.7  80.0 - 100.0 fL Final  . MCH 04/08/2019 30.4  26.0 - 34.0 pg Final  . MCHC 04/08/2019 32.8  30.0 - 36.0 g/dL Final  . RDW 04/08/2019 14.0  11.5 - 15.5 % Final  . Platelets 04/08/2019 189  150 - 400 K/uL Final  . nRBC 04/08/2019 0.0  0.0 - 0.2 % Final   Performed at [facility], [address], [place], [place] [address]    Assessment: 29 y.o. s/p myomectomy stable  Plan: Patient has done  well after surgery with no apparent complications.  I have discussed the post-operative course to date, and the expected progress moving forward.  The patient understands what complications to be concerned about.  I will see the patient in routine follow up, or sooner if needed.    Activity plan: No heavy lifting.  Refill percocet    [name], MD, [place], [place] [facility] 04/14/2019, 2:20 PM

## 2019-04-21 ENCOUNTER — Ambulatory Visit (INDEPENDENT_AMBULATORY_CARE_PROVIDER_SITE_OTHER): Payer: Medicaid Other | Admitting: Obstetrics and Gynecology

## 2019-04-21 ENCOUNTER — Encounter: Payer: Self-pay | Admitting: Obstetrics and Gynecology

## 2019-04-21 ENCOUNTER — Other Ambulatory Visit: Payer: Self-pay

## 2019-04-21 VITALS — BP 100/66 | HR 91 | Ht 63.0 in | Wt 134.0 lb

## 2019-04-21 DIAGNOSIS — Z4889 Encounter for other specified surgical aftercare: Secondary | ICD-10-CM

## 2019-04-21 MED ORDER — HYDROCODONE-ACETAMINOPHEN 5-325 MG PO TABS: 1.0000 | ORAL_TABLET | Freq: Four times a day (QID) | ORAL | 0 refills | Status: DC | PRN

## 2019-04-21 NOTE — Progress Notes (Signed)
Postoperative Follow-up Patient presents post op from myomectomy via laparotomy 2weeks ago for uterine fibroid, recurrent miscarriage.  Subjective: Patient reports some improvement in her preop symptoms. Eating a regular diet without difficulty.  Loose stools have resolved. Still requiring percocet by patients own admission poor pain tolerance.  Day to day activities still limited by pain and mobility..  Objective: Blood pressure 100/66, pulse 91, height 5' 3"  (1.6 m), weight 134 lb (60.8 kg), last menstrual period 03/27/2019, unknown if currently breastfeeding.  General: NAD Pulmonary: no increased work of breathing Abdomen: soft, non-tender, non-distended, incision(s) D/C/I Extremities: no edema Neurologic: normal gait   Admission on 04/06/2019, Discharged on 04/09/2019  Component Date Value Ref Range Status  . Preg Test, Ur 04/06/2019 NEGATIVE  NEGATIVE Final   Comment:        THE SENSITIVITY OF THIS METHODOLOGY IS >24 mIU/mL   . SURGICAL PATHOLOGY 04/06/2019    Final-Edited                   Value:SURGICAL PATHOLOGY CASE: [id number] PATIENT: Kathleen Owen Surgical Pathology Report     Specimen Submitted: A. Intramural fibroid  Clinical History: Intramural fibroid, recurrent miscarriage      DIAGNOSIS: A. INTRAMURAL FIBROID; MYOMECTOMY: - FRAGMENTS OF BENIGN SMOOTH MUSCLE, CONSISTENT WITH LEIOMYOMA. - NEGATIVE FOR ATYPIA AND MALIGNANCY.   GROSS DESCRIPTION: A. Labeled: Intramural fibroid Received: In formalin Weight: 17 g Size: 3.5 x 3.0 x 2.5 cm Description: Whorled tan/white soft tissue without hemorrhage or necrosis Representatively submitted in 2 cassettes.   Final Diagnosis performed by Betsy Pries, MD.   Electronically signed 04/07/2019 10:07:30AM The electronic signature indicates that the named Attending Pathologist has evaluated the specimen Technical component performed at [facility], [address] [place], [place] [address] Lab:  [phone] Dir: Rush Farmer, MD, MMM  Professional component performed at [facility], [facility], [place], [place], [place] [address] Lab: [phone] Dir: Dellia Nims. Rubinas, MD   . WBC 04/07/2019 22.2* 4.0 - 10.5 K/uL Final  . RBC 04/07/2019 4.13  3.87 - 5.11 MIL/uL Final  . Hemoglobin 04/07/2019 12.7  12.0 - 15.0 g/dL Final  . HCT 04/07/2019 37.7  36.0 - 46.0 % Final  . MCV 04/07/2019 91.3  80.0 - 100.0 fL Final  . MCH 04/07/2019 30.8  26.0 - 34.0 pg Final  . MCHC 04/07/2019 33.7  30.0 - 36.0 g/dL Final  . RDW 04/07/2019 13.8  11.5 - 15.5 % Final  . Platelets 04/07/2019 189  150 - 400 K/uL Final  . nRBC 04/07/2019 0.0  0.0 - 0.2 % Final   Performed at [facility], [address], [place], [place] [address]  . Sodium 04/07/2019 139  135 - 145 mmol/L Final  . Potassium 04/07/2019 3.5  3.5 - 5.1 mmol/L Final  . Chloride 04/07/2019 106  98 - 111 mmol/L Final  . CO2 04/07/2019 24  22 - 32 mmol/L Final  . Glucose, Bld 04/07/2019 126* 70 - 99 mg/dL Final  . BUN 04/07/2019 6  6 - 20 mg/dL Final  . Creatinine, Ser 04/07/2019 0.50  0.44 - 1.00 mg/dL Final  . Calcium 04/07/2019 8.7* 8.9 - 10.3 mg/dL Final  . GFR calc non Af Amer 04/07/2019 >60  >60 mL/min Final  . GFR calc Af Amer 04/07/2019 >60  >60 mL/min Final  .  Anion gap 04/07/2019 9  5 - 15 Final   Performed at [facility], [address] [place], [place] [address]  . WBC 04/08/2019 17.4* 4.0 - 10.5 K/uL Final  . RBC 04/08/2019 3.85* 3.87 - 5.11 MIL/uL Final  . Hemoglobin 04/08/2019 11.7* 12.0 - 15.0 g/dL Final  . HCT 04/08/2019 35.7* 36.0 - 46.0 % Final  . MCV 04/08/2019 92.7  80.0 - 100.0 fL Final  . MCH 04/08/2019 30.4  26.0 - 34.0 pg Final  . MCHC 04/08/2019 32.8  30.0 - 36.0 g/dL Final  . RDW 04/08/2019 14.0  11.5 - 15.5 % Final  . Platelets 04/08/2019 189  150 - 400 K/uL Final  . nRBC 04/08/2019 0.0  0.0 - 0.2 % Final   Performed at [facility], [address], [place], [place] [address]    Assessment: 29 y.o. s/p myomectomy stable  Plan: Patient has done well after surgery with no apparent complications.  I have discussed the post-operative course to date, and the expected progress moving forward.  The patient understands what complications to be concerned about.  I will see the patient in routine follow up, or sooner if needed.    Activity plan: No heavy lifting.  Step down to norco from percocet.  Discussed to expect menses  [name], MD, [facility], [place]

## 2019-05-09 ENCOUNTER — Other Ambulatory Visit: Payer: Self-pay | Admitting: Obstetrics and Gynecology

## 2019-05-09 MED ORDER — MEDROXYPROGESTERONE ACETATE 10 MG PO TABS: 10.0000 mg | ORAL_TABLET | Freq: Every day | ORAL | 0 refills | Status: DC

## 2019-05-19 ENCOUNTER — Encounter: Payer: Self-pay | Admitting: Obstetrics and Gynecology

## 2019-05-19 ENCOUNTER — Ambulatory Visit (INDEPENDENT_AMBULATORY_CARE_PROVIDER_SITE_OTHER): Payer: Medicaid Other | Admitting: Obstetrics and Gynecology

## 2019-05-19 ENCOUNTER — Other Ambulatory Visit: Payer: Self-pay

## 2019-05-19 VITALS — BP 98/62 | HR 103 | Wt 130.0 lb

## 2019-05-19 DIAGNOSIS — R1011 Right upper quadrant pain: Secondary | ICD-10-CM

## 2019-05-19 DIAGNOSIS — Z4889 Encounter for other specified surgical aftercare: Secondary | ICD-10-CM

## 2019-05-19 NOTE — Progress Notes (Signed)
Postoperative Follow-up Patient presents post op from laparotomy for myomectomy 6weeks ago for fibroids.  Subjective: Patient reports marked improvement in her preop symptoms. Eating a regular diet without difficulty. The patient is not having any pain.  Activity: normal activities of daily living. Did have significant dysmenorrhea with last menses  Objective: Blood pressure 98/62, pulse (!) 103, weight 130 lb (59 kg), last menstrual period 04/25/2019, unknown if currently breastfeeding.  General: NAD Pulmonary: no increased work of breathing Abdomen: soft, non-tender, non-distended, incision D/C/I Extremities: no edema Neurologic: normal gait    Admission on 04/06/2019, Discharged on 04/09/2019  Component Date Value Ref Range Status  . Preg Test, Ur 04/06/2019 NEGATIVE  NEGATIVE Final   Comment:        THE SENSITIVITY OF THIS METHODOLOGY IS >24 mIU/mL   . SURGICAL PATHOLOGY 04/06/2019    Final-Edited                   Value:SURGICAL PATHOLOGY CASE: [id number] PATIENT: Kathleen Owen Surgical Pathology Report     Specimen Submitted: A. Intramural fibroid  Clinical History: Intramural fibroid, recurrent miscarriage      DIAGNOSIS: A. INTRAMURAL FIBROID; MYOMECTOMY: - FRAGMENTS OF BENIGN SMOOTH MUSCLE, CONSISTENT WITH LEIOMYOMA. - NEGATIVE FOR ATYPIA AND MALIGNANCY.   GROSS DESCRIPTION: A. Labeled: Intramural fibroid Received: In formalin Weight: 17 g Size: 3.5 x 3.0 x 2.5 cm Description: Whorled tan/white soft tissue without hemorrhage or necrosis Representatively submitted in 2 cassettes.   Final Diagnosis performed by Betsy Pries, MD.   Electronically signed 04/07/2019 10:07:30AM The electronic signature indicates that the named Attending Pathologist has evaluated the specimen Technical component performed at [facility], [address], [place], [place] [address] Lab: [phone] Dir: Rush Farmer, MD, MMM  Professional component performed at  [facility], [facility], [place], [place], [place] [address] Lab: [phone] Dir: Dellia Nims. Rubinas, MD   . WBC 04/07/2019 22.2* 4.0 - 10.5 K/uL Final  . RBC 04/07/2019 4.13  3.87 - 5.11 MIL/uL Final  . Hemoglobin 04/07/2019 12.7  12.0 - 15.0 g/dL Final  . HCT 04/07/2019 37.7  36.0 - 46.0 % Final  . MCV 04/07/2019 91.3  80.0 - 100.0 fL Final  . MCH 04/07/2019 30.8  26.0 - 34.0 pg Final  . MCHC 04/07/2019 33.7  30.0 - 36.0 g/dL Final  . RDW 04/07/2019 13.8  11.5 - 15.5 % Final  . Platelets 04/07/2019 189  150 - 400 K/uL Final  . nRBC 04/07/2019 0.0  0.0 - 0.2 % Final   Performed at [facility], [address] [place], [place] [address]  . Sodium 04/07/2019 139  135 - 145 mmol/L Final  . Potassium 04/07/2019 3.5  3.5 - 5.1 mmol/L Final  . Chloride 04/07/2019 106  98 - 111 mmol/L Final  . CO2 04/07/2019 24  22 - 32 mmol/L Final  . Glucose, Bld 04/07/2019 126* 70 - 99 mg/dL Final  . BUN 04/07/2019 6  6 - 20 mg/dL Final  . Creatinine, Ser 04/07/2019 0.50  0.44 - 1.00 mg/dL Final  . Calcium 04/07/2019 8.7* 8.9 - 10.3 mg/dL Final  . GFR calc non Af Amer 04/07/2019 >60  >60 mL/min Final  . GFR calc Af Amer 04/07/2019 >60  >60 mL/min Final  . Anion gap 04/07/2019 9  5 - 15 Final  Performed at [facility], [address] [place], [place] [address]  . WBC 04/08/2019 17.4* 4.0 - 10.5 K/uL Final  . RBC 04/08/2019 3.85* 3.87 - 5.11 MIL/uL Final  . Hemoglobin 04/08/2019 11.7* 12.0 - 15.0 g/dL Final  . HCT 04/08/2019 35.7* 36.0 - 46.0 % Final  . MCV 04/08/2019 92.7  80.0 - 100.0 fL Final  . MCH 04/08/2019 30.4  26.0 - 34.0 pg Final  . MCHC 04/08/2019 32.8  30.0 - 36.0 g/dL Final  . RDW 04/08/2019 14.0  11.5 - 15.5 % Final  . Platelets 04/08/2019 189  150 - 400 K/uL Final  . nRBC 04/08/2019 0.0  0.0 - 0.2 % Final   Performed at [facility], [address] [place],  [address]    Assessment: 29 y.o.  s/p myomectomy stable  Plan: Patient has done well after surgery with no apparent complications.  I have discussed the post-operative course to date, and the expected progress moving forward.  The patient understands what complications to be concerned about.  I will see the patient in routine follow up, or sooner if needed.    Activity plan: No restriction.  RUQ pain - set up RUQ ultrasound for RUQ pain. Negative Murphy's sign today on exam.   Last menses significant dysmenorrhea, if continues discussed cycle supressive OCP   Return in about 1 year (around 05/18/2020) for annual.    [name], MD, [place], [facility] 05/19/2019, 2:33 PM

## 2019-06-05 ENCOUNTER — Ambulatory Visit: Payer: Medicaid Other

## 2019-06-07 ENCOUNTER — Other Ambulatory Visit: Payer: Self-pay

## 2019-06-07 ENCOUNTER — Ambulatory Visit
Admission: RE | Admit: 2019-06-07 | Discharge: 2019-06-07 | Disposition: A | Discharge status: Home / Self Care | Payer: Medicaid Other | Source: Ambulatory Visit | Attending: Obstetrics and Gynecology | Admitting: Obstetrics and Gynecology

## 2019-06-07 DIAGNOSIS — R1011 Right upper quadrant pain: Secondary | ICD-10-CM | POA: Diagnosis present

## 2019-08-01 DIAGNOSIS — F411 Generalized anxiety disorder: Secondary | ICD-10-CM

## 2019-08-01 DIAGNOSIS — F321 Major depressive disorder, single episode, moderate: Secondary | ICD-10-CM

## 2019-08-02 ENCOUNTER — Ambulatory Visit: Payer: Medicaid Other | Admitting: Physician Assistant

## 2019-08-02 ENCOUNTER — Encounter: Payer: Self-pay | Admitting: Physician Assistant

## 2019-08-02 ENCOUNTER — Other Ambulatory Visit: Payer: Self-pay

## 2019-08-02 DIAGNOSIS — Z3169 Encounter for other general counseling and advice on procreation: Secondary | ICD-10-CM

## 2019-08-02 DIAGNOSIS — Z113 Encounter for screening for infections with a predominantly sexual mode of transmission: Secondary | ICD-10-CM | POA: Diagnosis not present

## 2019-08-02 LAB — WET PREP FOR TRICH, YEAST, CLUE
Trichomonas Exam: NEGATIVE
Yeast Exam: NEGATIVE

## 2019-08-02 MED ORDER — MULTI-VITAMIN/MINERALS PO TABS: 1.0000 | ORAL_TABLET | Freq: Every day | ORAL | 0 refills | Status: DC

## 2019-08-02 NOTE — Progress Notes (Signed)
[facility] Department STI clinic/screening visit  Subjective:  Kathleen Owen is a 29 y.o. female being seen today for an STI screening visit. The patient reports they do not have symptoms.  Patient reports that they do desire a pregnancy in the next year.   They reported they are not interested in discussing contraception today.  No LMP recorded.   Patient has the following medical conditions:   Patient Active Problem List   Diagnosis Date Noted  . Uterine fibroid 04/06/2019  . Supervision of high risk pregnancy, antepartum 11/24/2018  . UTI (urinary tract infection) during pregnancy 07/19/2018  . 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 C. difficile colitis 07/23/2017  . 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 Generalized anxiety disorder 09/17/2015  . Major depressive disorder, single episode, moderate ([place]) 09/17/2015  . Smoker 11/22/2014  . Recurrent vaginitis 11/22/2014  . 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 CD (Crohn's disease) ([place]) 07/30/2014  . Body mass index (BMI) of 27.0-27.9 in adult 10/03/2013    Chief Complaint  Patient presents with  . SEXUALLY TRANSMITTED DISEASE    screening    HPI  Patient reports that she noticed a change in her discharge prior to her period last month and would like a screening today.  Denies current medications.  Reports h/o Crohn's disease, anxiety and depression.  States that she has had a tonsillectomy and had uterine fibroid removal in February of this year.  Last pap was 11/2018, last HIV test was 07/06/2019 and LMP 07/06/2019.  See flowsheet for further details and programmatic requirements.    The following portions of the patient's history were reviewed and updated as appropriate: allergies, current medications, past medical history, past social history, past surgical history and problem list.  Objective:  There were no vitals filed for this visit.  Physical  Exam Constitutional:      General: She is not in acute distress.    Appearance: Normal appearance.  HENT:     Head: Normocephalic and atraumatic.     Comments: No nits, lice, or hair loss. No cervical, supraclavicular or axillary adenopathy.    Mouth/Throat:     Mouth: Mucous membranes are moist.     Pharynx: Oropharynx is clear. No oropharyngeal exudate or posterior oropharyngeal erythema.  Eyes:     Conjunctiva/sclera: Conjunctivae normal.  Pulmonary:     Effort: Pulmonary effort is normal.  Abdominal:     Palpations: Abdomen is soft. There is no mass.     Tenderness: There is no abdominal tenderness. There is no guarding or rebound.  Genitourinary:    General: Normal vulva.     Rectum: Normal.     Comments: External genitalia/pubic area without nits, lice, edema, erythema, lesions and inguinal adenopathy. Vagina with normal mucosa and discharge. Cervix without visible lesions. Uterus firm, mobile, nt, no masses, no CMT, no adnexal tenderness or fullness. Musculoskeletal:     Cervical back: Neck supple. No tenderness.  Skin:    General: Skin is warm and dry.     Findings: No bruising, erythema, lesion or rash.  Neurological:     Mental Status: She is alert and oriented to person, place, and time.  Psychiatric:        Mood and Affect: Mood normal.        Behavior: Behavior normal.        Thought Content: Thought content normal.        Judgment: Judgment normal.  Assessment and Plan:  Kathleen Owen is a 29 y.o. female presenting to the [facility] Department for STI screening  1. Screening for STD (sexually transmitted disease) Patient into clinic without symptoms. Reviewed that exam and wet mount findings are normal and no treatment is indicated today. Rec condoms with all sex. Await test results.  Counseled that RN will call if needs to RTC for treatment once results are back. - WET PREP FOR [place], YEAST, CLUE - Chlamydia/Gonorrhea [facility] [facility] -  HIV [place] [facility] - Syphilis Serology, [place] [facility] - Gonococcus culture  2. Pre-conception counseling Counseled patient re:  Healthy habits and pamphlet given to patient. Enc to d/c smoking. MVI 1 po daily for folic acid. - Multiple Vitamins-Minerals (MULTIVITAMIN WITH MINERALS) tablet; Take 1 tablet by mouth daily.  Dispense: 100 tablet; Refill: 0     No follow-ups on file.  No future appointments.  Jerene Dilling, PA

## 2019-08-08 LAB — GONOCOCCUS CULTURE

## 2019-08-21 ENCOUNTER — Ambulatory Visit: Payer: Medicaid Other | Admitting: Licensed Clinical Social Worker

## 2019-08-21 ENCOUNTER — Other Ambulatory Visit: Payer: Self-pay

## 2019-08-22 NOTE — Progress Notes (Unsigned)
hell

## 2019-08-29 ENCOUNTER — Ambulatory Visit: Payer: Medicaid Other

## 2019-08-31 ENCOUNTER — Other Ambulatory Visit: Payer: Self-pay

## 2019-08-31 ENCOUNTER — Ambulatory Visit: Payer: Medicaid Other | Admitting: Physician Assistant

## 2019-08-31 DIAGNOSIS — Z299 Encounter for prophylactic measures, unspecified: Secondary | ICD-10-CM

## 2019-08-31 DIAGNOSIS — N739 Female pelvic inflammatory disease, unspecified: Secondary | ICD-10-CM | POA: Diagnosis not present

## 2019-08-31 DIAGNOSIS — Z113 Encounter for screening for infections with a predominantly sexual mode of transmission: Secondary | ICD-10-CM | POA: Diagnosis not present

## 2019-08-31 LAB — WET PREP FOR TRICH, YEAST, CLUE
Trichomonas Exam: NEGATIVE
Yeast Exam: NEGATIVE

## 2019-08-31 MED ORDER — METRONIDAZOLE 500 MG PO TABS: 500.0000 mg | ORAL_TABLET | Freq: Two times a day (BID) | ORAL | 0 refills | Status: AC

## 2019-08-31 MED ORDER — LEVOFLOXACIN 500 MG PO TABS: 500.0000 mg | ORAL_TABLET | Freq: Every day | ORAL | 0 refills | Status: DC

## 2019-08-31 MED ORDER — CLOTRIMAZOLE 1 % VA CREA: 1.0000 | TOPICAL_CREAM | Freq: Every day | VAGINAL | 0 refills | Status: AC

## 2019-09-01 ENCOUNTER — Encounter: Payer: Self-pay | Admitting: Physician Assistant

## 2019-09-01 NOTE — Progress Notes (Signed)
[facility] Department STI clinic/screening visit  Subjective:  Kathleen Owen is a 29 y.o. female being seen today for an STI screening visit. The patient reports they do have symptoms.  Patient reports that they would be ok if a pregnancy were to occur  in the next year.   They reported they are not interested in discussing contraception today.  No LMP recorded.   Patient has the following medical conditions:   Patient Active Problem List   Diagnosis Date Noted  . Uterine fibroid 04/06/2019  . Supervision of high risk pregnancy, antepartum 11/24/2018  . UTI (urinary tract infection) during pregnancy 07/19/2018  . 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 C. difficile colitis 07/23/2017  . 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 Generalized anxiety disorder 09/17/2015  . Major depressive disorder, single episode, moderate ([place]) 09/17/2015  . Smoker 11/22/2014  . Recurrent vaginitis 11/22/2014  . 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 CD (Crohn's disease) ([place]) 07/30/2014  . Body mass index (BMI) of 27.0-27.9 in adult 10/03/2013    Chief Complaint  Patient presents with  . SEXUALLY TRANSMITTED DISEASE    screening    HPI  Patient reports that she has had a thick discharge with an odor for a week or so and had pain with sex last PM.  Denies any changes to her history since she was seen at 08/02/2019 visit except for the discharge with odor , pain with sex and LMP 08/07/2019.   Requests testing to check for BV since just recently tested for other infections and they were negative.  Declines blood work today.    See flowsheet for further details and programmatic requirements.    The following portions of the patient's history were reviewed and updated as appropriate: allergies, current medications, past medical history, past social history, past surgical history and problem list.  Objective:  There were no vitals  filed for this visit.  Physical Exam Constitutional:      General: She is not in acute distress.    Appearance: Normal appearance.  HENT:     Head: Normocephalic and atraumatic.     Mouth/Throat:     Mouth: Mucous membranes are moist.     Pharynx: Oropharynx is clear. No oropharyngeal exudate or posterior oropharyngeal erythema.  Eyes:     Conjunctiva/sclera: Conjunctivae normal.  Pulmonary:     Effort: Pulmonary effort is normal.  Abdominal:     Palpations: Abdomen is soft. There is no mass.     Tenderness: There is no abdominal tenderness. There is no guarding or rebound.  Genitourinary:    General: Normal vulva.     Rectum: Normal.     Comments: External genitalia/pubic area without nits, lice, edema, erythema, lesions and inguinal adenopathy. Vagina with normal mucosa and discharge. Cervix without visible lesions, + CMT. Uterus firm, mobile, tender to palpation and movement, no masses, no adnexal tenderness or fullness. Musculoskeletal:     Cervical back: Neck supple. No tenderness.  Skin:    General: Skin is warm and dry.     Findings: No bruising, erythema, lesion or rash.  Neurological:     Mental Status: She is alert and oriented to person, place, and time.  Psychiatric:        Mood and Affect: Mood normal.        Behavior: Behavior normal.        Thought Content: Thought content normal.  Judgment: Judgment normal.      Assessment and Plan:  ALBERT DEVAUL is a 29 y.o. female presenting to the [facility] Department for STI screening  1. Screening for STD (sexually transmitted disease) Patient into clinic with symptoms. Declines blood work and retesting for [employer] today. Rec condoms with all sex.  - WET PREP FOR [place], YEAST, CLUE  2. Pelvic inflammatory disease Will treat for PID with Levofloxiacin 569m #14 1 po daily for 14 days and Metronidazole 5062m#28 1 po BID for 14 days. No sex for 14 days and until after partner completes  treatment. Call with questions or concerns and to ER if begins to have abdominal pain, nausea, vomiting, fever, chills. - levofloxacin (LEVAQUIN) 500 MG tablet; Take 1 tablet (500 mg total) by mouth daily.  Dispense: 14 tablet; Refill: 0 - metroNIDAZOLE (FLAGYL) 500 MG tablet; Take 1 tablet (500 mg total) by mouth 2 (two) times daily for 14 days.  Dispense: 28 tablet; Refill: 0  3. Prophylactic measure Will give Clotrimazole 1% vaginal cream to use if has itching during antibiotic treatment.  - clotrimazole (CLOTRIMAZOLE-7) 1 % vaginal cream; Place 1 Applicatorful vaginally at bedtime for 7 days.  Dispense: 45 g; Refill: 0     No follow-ups on file.  Future Appointments  Date Time Provider [place]8/18/2021  1:00 PM EaUrsula AlertMD [place]

## 2019-09-03 NOTE — Progress Notes (Signed)
Chart reviewed by Pharmacist  Claire Shown PharmD, Contract Pharmacist at [facility] Department

## 2019-09-08 NOTE — Telephone Encounter (Signed)
Pt has been seen since this msg receiv'd.

## 2019-10-11 ENCOUNTER — Encounter: Payer: Self-pay | Admitting: Psychiatry

## 2019-10-11 ENCOUNTER — Telehealth (INDEPENDENT_AMBULATORY_CARE_PROVIDER_SITE_OTHER): Payer: Medicaid Other | Admitting: Psychiatry

## 2019-10-11 ENCOUNTER — Telehealth: Payer: Self-pay

## 2019-10-11 ENCOUNTER — Other Ambulatory Visit: Payer: Self-pay

## 2019-10-11 DIAGNOSIS — F17200 Nicotine dependence, unspecified, uncomplicated: Secondary | ICD-10-CM

## 2019-10-11 DIAGNOSIS — F329 Major depressive disorder, single episode, unspecified: Secondary | ICD-10-CM | POA: Diagnosis not present

## 2019-10-11 DIAGNOSIS — F4310 Post-traumatic stress disorder, unspecified: Secondary | ICD-10-CM | POA: Diagnosis not present

## 2019-10-11 DIAGNOSIS — F4010 Social phobia, unspecified: Secondary | ICD-10-CM | POA: Diagnosis not present

## 2019-10-11 DIAGNOSIS — F32A Depression, unspecified: Secondary | ICD-10-CM

## 2019-10-11 DIAGNOSIS — F411 Generalized anxiety disorder: Secondary | ICD-10-CM | POA: Diagnosis not present

## 2019-10-11 MED ORDER — TRAZODONE HCL 100 MG PO TABS: 100.0000 mg | ORAL_TABLET | Freq: Every evening | ORAL | 1 refills | Status: DC | PRN

## 2019-10-11 MED ORDER — DULOXETINE HCL 30 MG PO CPEP: 30.0000 mg | ORAL_CAPSULE | Freq: Every day | ORAL | 1 refills | Status: DC

## 2019-10-11 MED ORDER — HYDROXYZINE HCL 25 MG PO TABS: 25.0000 mg | ORAL_TABLET | Freq: Three times a day (TID) | ORAL | 1 refills | Status: DC | PRN

## 2019-10-11 NOTE — Telephone Encounter (Signed)
faxed and confirmed labwork orders sent to [facility]

## 2019-10-11 NOTE — Patient Instructions (Signed)
Hydroxyzine capsules or tablets What is this medicine? HYDROXYZINE (hye [place] i zeen) is an antihistamine. This medicine is used to treat allergy symptoms. It is also used to treat anxiety and tension. This medicine can be used with other medicines to induce sleep before surgery. This medicine may be used for other purposes; ask your health care provider or pharmacist if you have questions. COMMON BRAND NAME(S): ANX, Atarax, Rezine, Vistaril What should I tell my health care provider before I take this medicine? They need to know if you have any of these conditions:  glaucoma  heart disease  history of irregular heartbeat  kidney disease  liver disease  lung or breathing disease, like asthma  stomach or intestine problems  thyroid disease  trouble passing urine  an unusual or allergic reaction to hydroxyzine, cetirizine, other medicines, foods, dyes or preservatives  pregnant or trying to get pregnant  breast-feeding How should I use this medicine? Take this medicine by mouth with a full glass of water. Follow the directions on the prescription label. You may take this medicine with food or on an empty stomach. Take your medicine at regular intervals. Do not take your medicine more often than directed. Talk to your pediatrician regarding the use of this medicine in children. Special care may be needed. While this drug may be prescribed for children as young as 108 years of age for selected conditions, precautions do apply. Patients over 55 years old may have a stronger reaction and need a smaller dose. Overdosage: If you think you have taken too much of this medicine contact a poison control center or emergency room at once. NOTE: This medicine is only for you. Do not share this medicine with others. What if I miss a dose? If you miss a dose, take it as soon as you can. If it is almost time for your next dose, take only that dose. Do not take double or extra doses. What may  interact with this medicine? Do not take this medicine with any of the following medications:  cisapride  dronedarone  pimozide  thioridazine This medicine may also interact with the following medications:  alcohol  antihistamines for allergy, cough, and cold  atropine  barbiturate medicines for sleep or seizures, like phenobarbital  certain antibiotics like erythromycin or clarithromycin  certain medicines for anxiety or sleep  certain medicines for bladder problems like oxybutynin, tolterodine  certain medicines for depression or psychotic disturbances  certain medicines for irregular heart beat  certain medicines for Parkinson's disease like benztropine, trihexyphenidyl  certain medicines for seizures like phenobarbital, primidone  certain medicines for stomach problems like dicyclomine, hyoscyamine  certain medicines for travel sickness like scopolamine  ipratropium  narcotic medicines for pain  other medicines that prolong the QT interval (an abnormal heart rhythm) like dofetilide This list may not describe all possible interactions. Give your health care provider a list of all the medicines, herbs, non-prescription drugs, or dietary supplements you use. Also tell them if you smoke, drink alcohol, or use illegal drugs. Some items may interact with your medicine. What should I watch for while using this medicine? Tell your doctor or health care professional if your symptoms do not improve. You may get drowsy or dizzy. Do not drive, use machinery, or do anything that needs mental alertness until you know how this medicine affects you. Do not stand or sit up quickly, especially if you are an older patient. This reduces the risk of dizzy or fainting spells. Alcohol may  interfere with the effect of this medicine. Avoid alcoholic drinks. Your mouth may get dry. Chewing sugarless gum or sucking hard candy, and drinking plenty of water may help. Contact your doctor if the  problem does not go away or is severe. This medicine may cause dry eyes and blurred vision. If you wear contact lenses you may feel some discomfort. Lubricating drops may help. See your eye doctor if the problem does not go away or is severe. If you are receiving skin tests for allergies, tell your doctor you are using this medicine. What side effects may I notice from receiving this medicine? Side effects that you should report to your doctor or health care professional as soon as possible:  allergic reactions like skin rash, itching or hives, swelling of the face, lips, or tongue  changes in vision  confusion  fast, irregular heartbeat  seizures  tremor  trouble passing urine or change in the amount of urine Side effects that usually do not require medical attention (report to your doctor or health care professional if they continue or are bothersome):  constipation  drowsiness  dry mouth  headache  tiredness This list may not describe all possible side effects. Call your doctor for medical advice about side effects. You may report side effects to FDA at 1-800-FDA-1088. Where should I keep my medicine? Keep out of the reach of children. Store at room temperature between 15 and 30 degrees C (59 and 86 degrees F). Keep container tightly closed. Throw away any unused medicine after the expiration date. NOTE: This sheet is a summary. It may not cover all possible information. If you have questions about this medicine, talk to your doctor, pharmacist, or health care provider.  2020 [employer] Standard (2018-01-31 13:19:55) Duloxetine delayed-release capsules What is this medicine? DULOXETINE (doo LOX e teen) is used to treat depression, anxiety, and different types of chronic pain. This medicine may be used for other purposes; ask your health care provider or pharmacist if you have questions. COMMON BRAND NAME(S): Cymbalta, Creig Hines, Irenka What should I tell my health care  provider before I take this medicine? They need to know if you have any of these conditions:  bipolar disorder  glaucoma  high blood pressure  kidney disease  liver disease  seizures  suicidal thoughts, plans or attempt; a previous suicide attempt by you or a family member  take medicines that treat or prevent blood clots  taken medicines called MAOIs like Carbex, Eldepryl, Marplan, Nardil, and Parnate within 14 days  trouble passing urine  an unusual reaction to duloxetine, other medicines, foods, dyes, or preservatives  pregnant or trying to get pregnant  breast-feeding How should I use this medicine? Take this medicine by mouth with a glass of water. Follow the directions on the prescription label. Do not crush, cut or chew some capsules of this medicine. Some capsules may be opened and sprinkled on applesauce. Check with your doctor or pharmacist if you are not sure. You can take this medicine with or without food. Take your medicine at regular intervals. Do not take your medicine more often than directed. Do not stop taking this medicine suddenly except upon the advice of your doctor. Stopping this medicine too quickly may cause serious side effects or your condition may worsen. A special MedGuide will be given to you by the pharmacist with each prescription and refill. Be sure to read this information carefully each time. Talk to your pediatrician regarding the use of this medicine in  children. While this drug may be prescribed for children as young as 63 years of age for selected conditions, precautions do apply. Overdosage: If you think you have taken too much of this medicine contact a poison control center or emergency room at once. NOTE: This medicine is only for you. Do not share this medicine with others. What if I miss a dose? If you miss a dose, take it as soon as you can. If it is almost time for your next dose, take only that dose. Do not take double or extra  doses. What may interact with this medicine? Do not take this medicine with any of the following medications:  desvenlafaxine  levomilnacipran  linezolid  MAOIs like Carbex, Eldepryl, Marplan, Nardil, and Parnate  methylene blue (injected into a vein)  milnacipran  thioridazine  venlafaxine This medicine may also interact with the following medications:  alcohol  amphetamines  aspirin and aspirin-like medicines  certain antibiotics like ciprofloxacin and enoxacin  certain medicines for blood pressure, heart disease, irregular heart beat  certain medicines for depression, anxiety, or psychotic disturbances  certain medicines for migraine headache like almotriptan, eletriptan, frovatriptan, naratriptan, rizatriptan, sumatriptan, zolmitriptan  certain medicines that treat or prevent blood clots like warfarin, enoxaparin, and dalteparin  cimetidine  fentanyl  lithium  NSAIDS, medicines for pain and inflammation, like ibuprofen or naproxen  phentermine  procarbazine  rasagiline  sibutramine  St. John's wort  theophylline  tramadol  tryptophan This list may not describe all possible interactions. Give your health care provider a list of all the medicines, herbs, non-prescription drugs, or dietary supplements you use. Also tell them if you smoke, drink alcohol, or use illegal drugs. Some items may interact with your medicine. What should I watch for while using this medicine? Tell your doctor if your symptoms do not get better or if they get worse. Visit your doctor or healthcare provider for regular checks on your progress. Because it may take several weeks to see the full effects of this medicine, it is important to continue your treatment as prescribed by your doctor. This medicine may cause serious skin reactions. They can happen weeks to months after starting the medicine. Contact your healthcare provider right away if you notice fevers or flu-like  symptoms with a rash. The rash may be red or purple and then turn into blisters or peeling of the skin. Or, you might notice a red rash with swelling of the face, lips, or lymph nodes in your neck or under your arms. Patients and their families should watch out for new or worsening thoughts of suicide or depression. Also watch out for sudden changes in feelings such as feeling anxious, agitated, panicky, irritable, hostile, aggressive, impulsive, severely restless, overly excited and hyperactive, or not being able to sleep. If this happens, especially at the beginning of treatment or after a change in dose, call your healthcare provider. You may get drowsy or dizzy. Do not drive, use machinery, or do anything that needs mental alertness until you know how this medicine affects you. Do not stand or sit up quickly, especially if you are an older patient. This reduces the risk of dizzy or fainting spells. Alcohol may interfere with the effect of this medicine. Avoid alcoholic drinks. This medicine can cause an increase in blood pressure. This medicine can also cause a sudden drop in your blood pressure, which may make you feel faint and increase the chance of a fall. These effects are most common when you first  start the medicine or when the dose is increased, or during use of other medicines that can cause a sudden drop in blood pressure. Check with your doctor for instructions on monitoring your blood pressure while taking this medicine. Your mouth may get dry. Chewing sugarless gum or sucking hard candy, and drinking plenty of water, may help. Contact your doctor if the problem does not go away or is severe. What side effects may I notice from receiving this medicine? Side effects that you should report to your doctor or health care professional as soon as possible:  allergic reactions like skin rash, itching or hives, swelling of the face, lips, or tongue  anxious  breathing  problems  confusion  changes in vision  chest pain  confusion  elevated mood, decreased need for sleep, racing thoughts, impulsive behavior  eye pain  fast, irregular heartbeat  feeling faint or lightheaded, falls  feeling agitated, angry, or irritable  hallucination, loss of contact with reality  high blood pressure  loss of balance or coordination  palpitations  redness, blistering, peeling or loosening of the skin, including inside the mouth  restlessness, pacing, inability to keep still  seizures  stiff muscles  suicidal thoughts or other mood changes  trouble passing urine or change in the amount of urine  trouble sleeping  unusual bleeding or bruising  unusually weak or tired  vomiting  yellowing of the eyes or skin Side effects that usually do not require medical attention (report to your doctor or health care professional if they continue or are bothersome):  change in sex drive or performance  change in appetite or weight  constipation  dizziness  dry mouth  headache  increased sweating  nausea  tired This list may not describe all possible side effects. Call your doctor for medical advice about side effects. You may report side effects to FDA at 1-800-FDA-1088. Where should I keep my medicine? Keep out of the reach of children. Store at room temperature between 15 and 30 degrees C (59 to 86 degrees F). Throw away any unused medicine after the expiration date. NOTE: This sheet is a summary. It may not cover all possible information. If you have questions about this medicine, talk to your doctor, pharmacist, or health care provider.  2020 [employer] Standard (2018-05-12 13:47:50) Trazodone tablets What is this medicine? TRAZODONE (TRAZ oh done) is used to treat depression. This medicine may be used for other purposes; ask your health care provider or pharmacist if you have questions. COMMON BRAND NAME(S): Desyrel What should I  tell my health care provider before I take this medicine? They need to know if you have any of these conditions:  attempted suicide or thinking about it  bipolar disorder  bleeding problems  glaucoma  heart disease, or previous heart attack  irregular heart beat  kidney or liver disease  low levels of sodium in the blood  an unusual or allergic reaction to trazodone, other medicines, foods, dyes or preservatives  pregnant or trying to get pregnant  breast-feeding How should I use this medicine? Take this medicine by mouth with a glass of water. Follow the directions on the prescription label. Take this medicine shortly after a meal or a light snack. Take your medicine at regular intervals. Do not take your medicine more often than directed. Do not stop taking this medicine suddenly except upon the advice of your doctor. Stopping this medicine too quickly may cause serious side effects or your condition may worsen. A  special MedGuide will be given to you by the pharmacist with each prescription and refill. Be sure to read this information carefully each time. Talk to your pediatrician regarding the use of this medicine in children. Special care may be needed. Overdosage: If you think you have taken too much of this medicine contact a poison control center or emergency room at once. NOTE: This medicine is only for you. Do not share this medicine with others. What if I miss a dose? If you miss a dose, take it as soon as you can. If it is almost time for your next dose, take only that dose. Do not take double or extra doses. What may interact with this medicine? Do not take this medicine with any of the following medications:  certain medicines for fungal infections like fluconazole, itraconazole, ketoconazole, posaconazole, voriconazole  cisapride  dronedarone  linezolid  MAOIs like Carbex, Eldepryl, Marplan, Nardil, and Parnate  mesoridazine  methylene blue (injected into  a vein)  pimozide  saquinavir  thioridazine This medicine may also interact with the following medications:  alcohol  antiviral medicines for HIV or AIDS  aspirin and aspirin-like medicines  barbiturates like phenobarbital  certain medicines for blood pressure, heart disease, irregular heart beat  certain medicines for depression, anxiety, or psychotic disturbances  certain medicines for migraine headache like almotriptan, eletriptan, frovatriptan, naratriptan, rizatriptan, sumatriptan, zolmitriptan  certain medicines for seizures like carbamazepine and phenytoin  certain medicines for sleep  certain medicines that treat or prevent blood clots like dalteparin, enoxaparin, warfarin  digoxin  fentanyl  lithium  NSAIDS, medicines for pain and inflammation, like ibuprofen or naproxen  other medicines that prolong the QT interval (cause an abnormal heart rhythm) like dofetilide  rasagiline  supplements like St. John's wort, kava kava, valerian  tramadol  tryptophan This list may not describe all possible interactions. Give your health care provider a list of all the medicines, herbs, non-prescription drugs, or dietary supplements you use. Also tell them if you smoke, drink alcohol, or use illegal drugs. Some items may interact with your medicine. What should I watch for while using this medicine? Tell your doctor if your symptoms do not get better or if they get worse. Visit your doctor or health care professional for regular checks on your progress. Because it may take several weeks to see the full effects of this medicine, it is important to continue your treatment as prescribed by your doctor. Patients and their families should watch out for new or worsening thoughts of suicide or depression. Also watch out for sudden changes in feelings such as feeling anxious, agitated, panicky, irritable, hostile, aggressive, impulsive, severely restless, overly excited and  hyperactive, or not being able to sleep. If this happens, especially at the beginning of treatment or after a change in dose, call your health care professional. Kathleen Owen may get drowsy or dizzy. Do not drive, use machinery, or do anything that needs mental alertness until you know how this medicine affects you. Do not stand or sit up quickly, especially if you are an older patient. This reduces the risk of dizzy or fainting spells. Alcohol may interfere with the effect of this medicine. Avoid alcoholic drinks. This medicine may cause dry eyes and blurred vision. If you wear contact lenses you may feel some discomfort. Lubricating drops may help. See your eye doctor if the problem does not go away or is severe. Your mouth may get dry. Chewing sugarless gum, sucking hard candy and drinking plenty of water  may help. Contact your doctor if the problem does not go away or is severe. What side effects may I notice from receiving this medicine? Side effects that you should report to your doctor or health care professional as soon as possible:  allergic reactions like skin rash, itching or hives, swelling of the face, lips, or tongue  elevated mood, decreased need for sleep, racing thoughts, impulsive behavior  confusion  fast, irregular heartbeat  feeling faint or lightheaded, falls  feeling agitated, angry, or irritable  loss of balance or coordination  painful or prolonged erections  restlessness, pacing, inability to keep still  suicidal thoughts or other mood changes  tremors  trouble sleeping  seizures  unusual bleeding or bruising Side effects that usually do not require medical attention (report to your doctor or health care professional if they continue or are bothersome):  change in sex drive or performance  change in appetite or weight  constipation  headache  muscle aches or pains  nausea This list may not describe all possible side effects. Call your doctor for medical  advice about side effects. You may report side effects to FDA at 1-800-FDA-1088. Where should I keep my medicine? Keep out of the reach of children. Store at room temperature between 15 and 30 degrees C (59 to 86 degrees F). Protect from light. Keep container tightly closed. Throw away any unused medicine after the expiration date. NOTE: This sheet is a summary. It may not cover all possible information. If you have questions about this medicine, talk to your doctor, pharmacist, or health care provider.  2020 [employer] Standard (2018-02-01 11:46:46)
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
## 2019-10-17 ENCOUNTER — Other Ambulatory Visit: Payer: Self-pay

## 2019-10-17 ENCOUNTER — Emergency Department
Admission: EM | Admit: 2019-10-17 | Discharge: 2019-10-17 | Disposition: A | Discharge status: Home / Self Care | Payer: Medicaid Other | Attending: Emergency Medicine | Admitting: Emergency Medicine

## 2019-10-17 ENCOUNTER — Telehealth: Payer: Self-pay | Admitting: Family Medicine

## 2019-10-17 DIAGNOSIS — N739 Female pelvic inflammatory disease, unspecified: Secondary | ICD-10-CM | POA: Diagnosis present

## 2019-10-17 DIAGNOSIS — B9689 Other specified bacterial agents as the cause of diseases classified elsewhere: Secondary | ICD-10-CM | POA: Diagnosis not present

## 2019-10-17 DIAGNOSIS — Z5321 Procedure and treatment not carried out due to patient leaving prior to being seen by health care provider: Secondary | ICD-10-CM | POA: Diagnosis not present

## 2019-10-17 LAB — BASIC METABOLIC PANEL
Anion gap: 9 (ref 5–15)
BUN: 12 mg/dL (ref 6–20)
CO2: 28 mmol/L (ref 22–32)
Calcium: 9.1 mg/dL (ref 8.9–10.3)
Chloride: 102 mmol/L (ref 98–111)
Creatinine, Ser: 0.64 mg/dL (ref 0.44–1.00)
GFR calc Af Amer: >60 mL/min (ref >60)
GFR calc non Af Amer: >60 mL/min (ref >60)
Glucose, Bld: 113 mg/dL — ABNORMAL HIGH (ref 70–99)
Potassium: 3.1 mmol/L — ABNORMAL LOW (ref 3.5–5.1)
Sodium: 139 mmol/L (ref 135–145)

## 2019-10-17 LAB — CBC
HCT: 41.3 % (ref 36.0–46.0)
Hemoglobin: 14.3 g/dL (ref 12.0–15.0)
MCH: 31.6 pg (ref 26.0–34.0)
MCHC: 34.6 g/dL (ref 30.0–36.0)
MCV: 91.4 fL (ref 80.0–100.0)
Platelets: 280 10*3/uL (ref 150–400)
RBC: 4.52 MIL/uL (ref 3.87–5.11)
RDW: 13.2 % (ref 11.5–15.5)
WBC: 18 10*3/uL — ABNORMAL HIGH (ref 4.0–10.5)
nRBC: 0 % (ref 0.0–0.2)

## 2019-10-17 MED ORDER — ONDANSETRON 4 MG PO TBDP: 4.0000 mg | ORAL_TABLET | Freq: Once | ORAL | Status: DC

## 2019-10-17 NOTE — ED Triage Notes (Signed)
Pt was dx with PID and was given meds for it but it has not helped. Pt has taken Tylenol as well with no relief.

## 2019-10-17 NOTE — Telephone Encounter (Signed)
Phone call to pt. Pt states she was diagnosed with PID at [facility] in July.  Pt states she was taking both oral antibiotics at the same time, has been vomiting it up within a couple of hours; did not get any injections at her visit.  Pt states she is allergic to ASA, Ibuprofen, and Amoxicillin. Her pain is horrible now, already considering going to hospital.  Consulted with provider, Hassell Done, [place].  Per verbal by Hassell Done, FNP, pt should go to ER.  Phone call to pt. Pt counseled that RN consulted with provider, and based on review of records and information provided by pt, pt advised to proceed to ER.

## 2019-10-17 NOTE — Telephone Encounter (Signed)
PLEASE CALL NEEDING MEDS FOR PID

## 2019-10-18 ENCOUNTER — Telehealth: Payer: Self-pay | Admitting: Emergency Medicine

## 2019-10-18 NOTE — Telephone Encounter (Signed)
Called patient due to lwot to inquire about condition and follow up plans. She says she is the same, continues to have abdominal pain.  She hasnot been taking the antibiotics because they were making her sick.  I told her that she really needs them and needs provider exam.  I told her to either come here or urgent care to have provider exam.  I told her that there is along wait sometimes in the ED, but that she really needs to just wait and be seen.

## 2019-10-19 ENCOUNTER — Ambulatory Visit
Admission: EM | Admit: 2019-10-19 | Discharge: 2019-10-19 | Disposition: A | Discharge status: Home / Self Care | Payer: Medicaid Other | Attending: Physician Assistant | Admitting: Physician Assistant

## 2019-10-19 ENCOUNTER — Encounter: Payer: Self-pay | Admitting: Emergency Medicine

## 2019-10-19 ENCOUNTER — Other Ambulatory Visit: Payer: Self-pay

## 2019-10-19 DIAGNOSIS — B9689 Other specified bacterial agents as the cause of diseases classified elsewhere: Secondary | ICD-10-CM | POA: Insufficient documentation

## 2019-10-19 DIAGNOSIS — R102 Pelvic and perineal pain: Secondary | ICD-10-CM | POA: Insufficient documentation

## 2019-10-19 DIAGNOSIS — N941 Unspecified dyspareunia: Secondary | ICD-10-CM | POA: Diagnosis present

## 2019-10-19 DIAGNOSIS — R112 Nausea with vomiting, unspecified: Secondary | ICD-10-CM | POA: Diagnosis not present

## 2019-10-19 DIAGNOSIS — N760 Acute vaginitis: Secondary | ICD-10-CM | POA: Insufficient documentation

## 2019-10-19 LAB — CBC WITH DIFFERENTIAL/PLATELET
Abs Immature Granulocytes: 0.07 10*3/uL (ref 0.00–0.07)
Basophils Absolute: 0.1 10*3/uL (ref 0.0–0.1)
Basophils Relative: 1 %
Eosinophils Absolute: 0.6 10*3/uL — ABNORMAL HIGH (ref 0.0–0.5)
Eosinophils Relative: 4 %
HCT: 38.6 % (ref 36.0–46.0)
Hemoglobin: 13.4 g/dL (ref 12.0–15.0)
Immature Granulocytes: 0 %
Lymphocytes Relative: 31 %
Lymphs Abs: 5.1 10*3/uL — ABNORMAL HIGH (ref 0.7–4.0)
MCH: 31.5 pg (ref 26.0–34.0)
MCHC: 34.7 g/dL (ref 30.0–36.0)
MCV: 90.6 fL (ref 80.0–100.0)
Monocytes Absolute: 1 10*3/uL (ref 0.1–1.0)
Monocytes Relative: 6 %
Neutro Abs: 10 10*3/uL — ABNORMAL HIGH (ref 1.7–7.7)
Neutrophils Relative %: 58 %
Platelets: 258 10*3/uL (ref 150–400)
RBC: 4.26 MIL/uL (ref 3.87–5.11)
RDW: 13.2 % (ref 11.5–15.5)
WBC: 16.8 10*3/uL — ABNORMAL HIGH (ref 4.0–10.5)
nRBC: 0 % (ref 0.0–0.2)

## 2019-10-19 LAB — CHLAMYDIA/NGC RT PCR (ARMC ONLY)
Chlamydia Tr: NOT DETECTED
N gonorrhoeae: NOT DETECTED

## 2019-10-19 LAB — BASIC METABOLIC PANEL
Anion gap: 6 (ref 5–15)
BUN: 9 mg/dL (ref 6–20)
CO2: 29 mmol/L (ref 22–32)
Calcium: 8.7 mg/dL — ABNORMAL LOW (ref 8.9–10.3)
Chloride: 102 mmol/L (ref 98–111)
Creatinine, Ser: 0.64 mg/dL (ref 0.44–1.00)
GFR calc Af Amer: >60 mL/min (ref >60)
GFR calc non Af Amer: >60 mL/min (ref >60)
Glucose, Bld: 109 mg/dL — ABNORMAL HIGH (ref 70–99)
Potassium: 3.4 mmol/L — ABNORMAL LOW (ref 3.5–5.1)
Sodium: 137 mmol/L (ref 135–145)

## 2019-10-19 LAB — WET PREP, GENITAL
Sperm: NONE SEEN
Trich, Wet Prep: NONE SEEN
Yeast Wet Prep HPF POC: NONE SEEN

## 2019-10-19 MED ORDER — METRONIDAZOLE 500 MG PO TABS: 500.0000 mg | ORAL_TABLET | Freq: Two times a day (BID) | ORAL | 0 refills | Status: AC

## 2019-10-19 MED ORDER — HYDROCODONE-ACETAMINOPHEN 5-325 MG PO TABS: 1.0000 | ORAL_TABLET | ORAL | 0 refills | Status: AC | PRN

## 2019-10-19 MED ORDER — ONDANSETRON 8 MG PO TBDP: 8.0000 mg | ORAL_TABLET | Freq: Three times a day (TID) | ORAL | 0 refills | Status: AC | PRN

## 2019-10-19 MED ORDER — KETOROLAC TROMETHAMINE 60 MG/2ML IM SOLN
30.0000 mg | Freq: Once | INTRAMUSCULAR | Status: AC
  Administered 2019-10-19: 30 mg via INTRAMUSCULAR

## 2019-10-19 MED ORDER — LEVOFLOXACIN 500 MG PO TABS: 500.0000 mg | ORAL_TABLET | Freq: Every day | ORAL | 0 refills | Status: AC

## 2019-10-19 NOTE — ED Triage Notes (Addendum)
Patient in today c/o abdominal and pelvic pain x 1 month, worse in the last 4 days. Patient diagnosed with PID by the health department 08-31-19 and treated with Flagyl, Levaquin and Clotrimazole-7. Patient was tested for STDs, HIV and were negative per patient. Patient was unable to keep the medications down and did not complete treatment. She states that she called the health department for 2 weeks to try to get something for nausea and when she finally spoke to someone, they told her to go to the ED.   Patient went to [facility] ED on 10/17/19 for evaluation and treatment. Patient had blood work drawn and left without being seen. ED called patient yesterday and advised to return to ED or Urgent Care for evaluation due to elevated WBC.

## 2019-10-19 NOTE — ED Provider Notes (Signed)
[facility]    CSN: [id number] Arrival date & time: 10/19/19  1633      History   Chief Complaint Chief Complaint  Patient presents with  . Pelvic Inflammatory Disease    HPI Kathleen Owen is a 29 y.o. female.   29 year old female presents for lower abdominal and pelvic pain x6 weeks.  She says she had an exam and testing through the health department in early July and was told that she probably had PID based on CMT tenderness.  She had labs performed for gonorrhea, trichomonas, BV, syphilis, and HIV.  All testing was negative.  She was prescribed multiple medications for possible PID and was unable to take them because it upset her stomach and she vomited the medication.  She went to the ED 2 days ago because the health department recommended she be seen for possible PID and new medication.  She left without being seen at the ED and was called back by someone from the ED and told that her white blood cell count was very elevated and that she needed to be seen again.  Patient says she only has 1 sexual partner and has not been able to have intercourse for several weeks due to her pain.  She has a history of fibroids that she had removed 6 months ago.  She says that her pelvic pain increases around her period.  Is currently on her menstrual period.  She does have a gynecologist but has not seen her gynecologist for current issue.  She denies fever, back pain, chills, sweats, nausea, vomiting.  She does have some vaginal discharge.  She has no other complaints or concerns.     Past Medical History:  Diagnosis Date  . 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 CD (Crohn's disease) ([place]) 07/30/2014  . Crohn's disease [facility]) Jan 2016  . Depression   . Miscarriage 07/18/2014    Patient Active Problem List   Diagnosis Date Noted  . PTSD (post-traumatic stress disorder) 10/11/2019  . Depression 10/11/2019  . Social anxiety disorder  10/11/2019  . Tobacco use disorder 10/11/2019  . Uterine fibroid 04/06/2019  . Supervision of high risk pregnancy, antepartum 11/24/2018  . UTI (urinary tract infection) during pregnancy 07/19/2018  . 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 Ileus ([place]) 12/01/2017  . Abdominal pain 12/01/2017  . C. difficile colitis 07/23/2017  . 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 GAD (generalized anxiety disorder) 09/17/2015  . Major depressive disorder, single episode, moderate ([place]) 09/17/2015  . Smoker 11/22/2014  . Recurrent vaginitis 11/22/2014  . 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 CD (Crohn's disease) ([place]) 07/30/2014  . Body mass index (BMI) of 27.0-27.9 in adult 10/03/2013    Past Surgical History:  Procedure Laterality Date  . DILATION AND CURETTAGE OF UTERUS    . DILATION AND EVACUATION N/A 12/02/2016   Procedure: DILATATION AND EVACUATION;  Surgeon: Harlin Heys, MD;  Location: [facility] ORS;  Service: Gynecology;  Laterality: N/A;  . DILATION AND EVACUATION N/A 06/30/2017   Procedure: DILATATION AND EVACUATION;  Surgeon: Harlin Heys, MD;  Location: [facility] ORS;  Service: Gynecology;  Laterality: N/A;  . DILATION AND EVACUATION N/A 12/06/2018   Procedure: SUCTION D&C;  Surgeon: [name], MD;  Location: [facility] ORS;  Service: Gynecology;  Laterality: N/A;  . MYOMECTOMY N/A 04/06/2019   Procedure: MYOMECTOMY VIA LAPAROTOMY;  Surgeon: [name], MD;  Location: [facility] ORS;  Service: Gynecology;  Laterality: N/A;  . TONSILLECTOMY      OB History    Gravida  17   Para  0   Term      Preterm      AB  16   Living  0     SAB  14   TAB  1   Ectopic      Multiple      Live Births  0            Home Medications    Prior to Admission medications   Medication Sig Start Date End Date Taking? Authorizing Provider  acetaminophen (TYLENOL) 500 MG tablet Take by mouth.   Yes [provider]    dicyclomine (BENTYL) 10 MG capsule Take 10 mg by mouth 4 (four) times daily. 07/27/19  Yes [provider]  DULoxetine (CYMBALTA) 30 MG capsule Take 1 capsule (30 mg total) by mouth daily. 10/11/19  Yes Ursula Alert, MD  hydrOXYzine (ATARAX/VISTARIL) 25 MG tablet Take 1 tablet (25 mg total) by mouth 3 (three) times daily as needed for anxiety (and sleep). 10/11/19  Yes Ursula Alert, MD  Multiple Vitamins-Minerals (MULTIVITAMIN WITH MINERALS) tablet Take 1 tablet by mouth daily. 08/02/19  Yes Jerene Dilling, PA  traZODone (DESYREL) 100 MG tablet Take 1-1.5 tablets (100-150 mg total) by mouth at bedtime as needed for sleep. 10/11/19  Yes Ursula Alert, MD  HYDROcodone-acetaminophen (NORCO/VICODIN) 5-325 MG tablet Take 1 tablet by mouth every 4 (four) hours as needed for up to 5 days. 10/19/19 10/24/19  Danton Clap, PA-C  levofloxacin (LEVAQUIN) 500 MG tablet Take 1 tablet (500 mg total) by mouth daily for 14 days. 10/19/19 11/02/19  Laurene Footman B, PA-C  lidocaine-prilocaine (EMLA) cream Apply 1 application topically as needed. Patient not taking: Reported on 10/11/2019 04/09/19   [name], MD  metroNIDAZOLE (FLAGYL) 500 MG tablet Take 1 tablet (500 mg total) by mouth 2 (two) times daily for 14 days. 10/19/19 11/02/19  Laurene Footman B, PA-C  ondansetron (ZOFRAN ODT) 8 MG disintegrating tablet Take 1 tablet (8 mg total) by mouth every 8 (eight) hours as needed for up to 14 days for nausea or vomiting. 10/19/19 11/02/19  Laurene Footman B, PA-C  polyethylene glycol powder (GLYCOLAX/MIRALAX) 17 GM/SCOOP powder Take by mouth as directed by your [facility] GI provider. 10/02/19   [provider]  simethicone (MYLICON) 419 MG chewable tablet Chew by mouth. Patient not taking: Reported on 10/11/2019 10/02/19   [provider]    Family History Family History  Problem Relation Age of Onset  . Healthy Mother   . Healthy Father   . Kidney disease Maternal Uncle   . Heart failure Maternal  Uncle   . Heart failure Maternal Grandmother   . Hypertension Paternal Grandmother   . Cancer Paternal Grandmother   . Thyroid disease Paternal Aunt   . Cancer Paternal Grandfather   . Mental illness Neg Hx     Social History Social History   Tobacco Use  . Smoking status: Current Every Day Smoker    Types: Cigars  . Smokeless tobacco: Never Used  . Tobacco comment: black and mild 2-3 cigars a day  Vaping Use  . Vaping Use: Never used  Substance Use Topics  . Alcohol use: Yes    Comment: occasionally  . Drug use: No     Allergies   Aspirin, Ibuprofen, and Amoxicillin   Review of Systems Review of  Systems  Constitutional: Positive for fatigue. Negative for fever.  Respiratory: Negative for shortness of breath.   Cardiovascular: Negative for chest pain.  Gastrointestinal: Positive for abdominal pain and nausea. Negative for abdominal distention, diarrhea and vomiting.  Genitourinary: Positive for pelvic pain, vaginal bleeding and vaginal discharge. Negative for difficulty urinating, dysuria and frequency.  Musculoskeletal: Negative for back pain and myalgias.  Neurological: Positive for headaches. Negative for weakness and light-headedness.     Physical Exam Triage Vital Signs ED Triage Vitals  Enc Vitals Group     BP 10/19/19 1721 (!) 87/60     Pulse Rate 10/19/19 1721 87     Resp 10/19/19 1721 18     Temp 10/19/19 1721 98.7 F (37.1 C)     Temp Source 10/19/19 1721 Oral     SpO2 10/19/19 1721 100 %     Weight 10/19/19 1723 135 lb (61.2 kg)     Height 10/19/19 1723 5' 3"  (1.6 m)     Head Circumference --      Peak Flow --      Pain Score 10/19/19 1722 9     Pain Loc --      Pain Edu? --      Excl. in [place]? --    No data found.  Updated Vital Signs BP 98/79 (BP Location: Left Arm)   Pulse 87   Temp 98.7 F (37.1 C) (Oral)   Resp 18   Ht 5' 3"  (1.6 m)   Wt 135 lb (61.2 kg)   LMP 10/16/2019 (Exact Date)   SpO2 100%   BMI 23.91 kg/m    Physical  Exam Vitals and nursing note reviewed.  Constitutional:      General: She is not in acute distress.    Appearance: Normal appearance. She is not ill-appearing or toxic-appearing.  HENT:     Head: Normocephalic and atraumatic.  Eyes:     General: No scleral icterus.       Right eye: No discharge.        Left eye: No discharge.     Conjunctiva/sclera: Conjunctivae normal.  Cardiovascular:     Rate and Rhythm: Normal rate and regular rhythm.     Heart sounds: Normal heart sounds.  Pulmonary:     Effort: Pulmonary effort is normal. No respiratory distress.     Breath sounds: Normal breath sounds.  Abdominal:     Palpations: Abdomen is soft.     Tenderness: There is abdominal tenderness (bilateral TTP of lower abdomen and pelvis). There is guarding.  Genitourinary:    Exam position: Lithotomy position.     Vagina: Vaginal discharge (small amout of discharge with odor) present.     Cervix: Cervical motion tenderness and cervical bleeding (patient menstruating) present.  Musculoskeletal:     Cervical back: Neck supple.  Skin:    General: Skin is dry.  Neurological:     General: No focal deficit present.     Mental Status: She is alert. Mental status is at baseline.     Motor: No weakness.     Gait: Gait normal.  Psychiatric:        Mood and Affect: Mood normal.        Behavior: Behavior normal.        Thought Content: Thought content normal.      UC Treatments / Results  Labs (all labs ordered are listed, but only abnormal results are displayed) Labs Reviewed  WET PREP, GENITAL - Abnormal; Notable for  the following components:      Result Value   Clue Cells Wet Prep HPF POC PRESENT (*)    WBC, Wet Prep HPF POC FEW (*)    All other components within normal limits  CBC WITH DIFFERENTIAL/PLATELET - Abnormal; Notable for the following components:   WBC 16.8 (*)    Neutro Abs 10.0 (*)    Lymphs Abs 5.1 (*)    Eosinophils Absolute 0.6 (*)    All other components within  normal limits  BASIC METABOLIC PANEL - Abnormal; Notable for the following components:   Potassium 3.4 (*)    Glucose, Bld 109 (*)    Calcium 8.7 (*)    All other components within normal limits  CHLAMYDIA/NGC RT PCR [facility] ONLY)    EKG   Radiology No results found.  Procedures Procedures (including critical care time)  Medications Ordered in UC Medications  ketorolac (TORADOL) injection 30 mg (30 mg Intramuscular Given 10/19/19 1852)    Initial Impression / Assessment and Plan / UC Course  I have reviewed the triage vital signs and the nursing notes.  Pertinent labs & imaging results that were available during my care of the patient were reviewed by me and considered in my medical decision making (see chart for details).    Patient presenting for concerns about PID since she has been told she is has not through the health department.  Reviewed previous progress notes which do diagnose her with PID.  However, all STI testing has been negative and so has BV and yeast testing.  Patient previously treated with Levaquin and metronidazole.  She has been treated with Levaquin since she has multiple allergies including anaphylaxis to penicillins.  Patient says her partner was also treated for possible STIs.  She says she was unable to complete the medication as prescribed to her due to it making her ill and having a lot of vomiting.  Stent pelvic pain for 6 weeks now.  Patient does have a history of Crohn's disease and uterine fibroids.  Exam performed today which does note some odorous discharge and cervical motion tenderness.  Patient is on her menstrual period at this time.  Wet prep performed which shows clue cells.  Sent metronidazole again for patient and prescribe Zofran for her to take as well.  Patient still concerned about possible PID.  Testing for STIs obtained again.  Patient says she is married and does not really have any concerns for STIs.  Advised patient that her  pelvic pain may be due to bacterial vaginosis, fibroids, Crohn's disease, possible endometriosis undiagnosed, or other gynecological condition.  Advised her to contact her gynecologist as soon as possible for an a.m. and possible ultrasound.  I did send Levaquin for her to take for possible PID at her request. Norco sent for pain after reviewing database. Advise going to ED if abdominal pain worsens or she develops a fever or any weakness.  Final Clinical Impressions(s) / UC Diagnoses   Final diagnoses:  Pelvic pain  Dyspareunia, female  Non-intractable vomiting with nausea, unspecified vomiting type  Bacterial vaginosis     Discharge Instructions     -You are having pelvic pain that may or may not be due to PID. Swabs obtained today. Treating for PID with levaquin and flagyl at this time. Take Zofran before taking antibiotics. Increase fluids and rest.Take pain meds as needed. Call your gynecologist tomorrow for appointment. You pain could be due to abscess, endometriosis, IBD, etc. You will likely need  a pelvic ultrasound and more work up. If fever or worse pain, go to ED  [facility]: Obstetrics and Gynecology [address]  [phone] Closed ? Opens 8AM Fri    ED Prescriptions    Medication Sig Dispense Auth. Provider   ondansetron (ZOFRAN ODT) 8 MG disintegrating tablet Take 1 tablet (8 mg total) by mouth every 8 (eight) hours as needed for up to 14 days for nausea or vomiting. 30 tablet Laurene Footman B, PA-C   metroNIDAZOLE (FLAGYL) 500 MG tablet Take 1 tablet (500 mg total) by mouth 2 (two) times daily for 14 days. 28 tablet Laurene Footman B, PA-C   levofloxacin (LEVAQUIN) 500 MG tablet Take 1 tablet (500 mg total) by mouth daily for 14 days. 14 tablet Laurene Footman B, PA-C   HYDROcodone-acetaminophen (NORCO/VICODIN) 5-325 MG tablet Take 1 tablet by mouth every 4 (four) hours as needed for up to 5 days. 20 tablet Danton Clap, PA-C     I have reviewed the PDMP  during this encounter.   Danton Clap, PA-C 10/20/19 [phone]

## 2019-10-19 NOTE — Discharge Instructions (Addendum)
-  You are having pelvic pain that may or may not be due to PID. Swabs obtained today. Treating for PID with levaquin and flagyl at this time. Take Zofran before taking antibiotics. Increase fluids and rest.Take pain meds as needed. Call your gynecologist tomorrow for appointment. You pain could be due to abscess, endometriosis, IBD, etc. You will likely need a pelvic ultrasound and more work up. If fever or worse pain, go to ED  [facility]: Obstetrics and Gynecology [address]  [phone] Closed ? Opens 8AM Fri

## 2019-10-31 NOTE — Telephone Encounter (Signed)
I  was consulted on the POC for this client.  I agree with the documented note and actions taken to provide care for this client.

## 2019-11-13 ENCOUNTER — Other Ambulatory Visit: Payer: Self-pay

## 2019-11-13 ENCOUNTER — Encounter: Payer: Self-pay | Admitting: Obstetrics and Gynecology

## 2019-11-13 ENCOUNTER — Ambulatory Visit (INDEPENDENT_AMBULATORY_CARE_PROVIDER_SITE_OTHER): Payer: Medicaid Other | Admitting: Obstetrics and Gynecology

## 2019-11-13 VITALS — BP 106/58 | Wt 135.0 lb

## 2019-11-13 DIAGNOSIS — R102 Pelvic and perineal pain: Secondary | ICD-10-CM

## 2019-11-13 DIAGNOSIS — G8929 Other chronic pain: Secondary | ICD-10-CM | POA: Diagnosis not present

## 2019-11-13 MED ORDER — NORETHINDRONE ACETATE 5 MG PO TABS: 5.0000 mg | ORAL_TABLET | Freq: Every day | ORAL | 11 refills | Status: DC

## 2019-11-13 NOTE — Progress Notes (Signed)
Gynecology Pelvic Pain Evaluation   Chief Complaint:  Chief Complaint  Patient presents with  . ER follow up    pelvic pain, heavy menses w/clots    History of Present Illness:   Patient is a 29 y.o. [id number] who LMP was Patient's last menstrual period was 10/16/2019., presents today for a problem visit.  She complains of pelvic and abdominal pain  Her pain is localized to the deep pelvis area, described as constant, chronic and its severity is described as severe. The pain radiates to the  Non-radiating. She has these associated symptoms which include dyspareunia, dysmenorrhea. Patient has these modifiers which include nothing that make it better and unable to associate with any factor that make it worse other than previously mention dysmenorrhea and dysparunia.    Previous evaluation: TVUS 06/07/2019 normal RUQ [place], CT 12/26/2018 Fundal fibroid otherwise negative. Prior Diagnosis: uterine fibroid Previous Treatment: 04/06/2019 myomectomy via laparotomy, normal anatomy otherwise. Pathology benign leiomyoma.    Does have a history of Crohn's disease as well.  Multiple first trimester miscarriages and susbequent D&C's.  No evidence of endometriosis at the time of myomectomy 12/06/2019.  She was recently treated for culture negative PID.  Menses had been normal light post myomectomy but recently have become more painful.    Review of Systems: Review of Systems  Constitutional: Negative.   Gastrointestinal: Positive for abdominal pain and diarrhea. Negative for nausea and vomiting.  Genitourinary: Negative.     Past Medical History:  Patient Active Problem List   Diagnosis Date Noted  . PTSD (post-traumatic stress disorder) 10/11/2019  . Depression 10/11/2019  . Social anxiety disorder 10/11/2019  . Tobacco use disorder 10/11/2019  . Uterine fibroid 04/06/2019  . Supervision of high risk pregnancy, antepartum 11/24/2018    Clinic [facility] Prenatal Labs  Dating  Blood type: A pos    Genetic Screen 1 Screen:    AFP:     Quad:     NIPS: Antibody: negative  Anatomic [place]  Rubella: Immune Varicella: Immune  GTT Early:               Third trimester:  RPR: NR  Rhogam  HBsAg: negative  TDaP vaccine                       Flu Shot: HIV: negative  Baby Food                                GBS:   Contraception  Pap: 11/22/2018  CBB   Hgb AA  CS/VBAC    Support Person         . UTI (urinary tract infection) during pregnancy 07/19/2018    E Coli on initial visit   . 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 Ileus ([place]) 12/01/2017  . Abdominal pain 12/01/2017  . C. difficile colitis 07/23/2017  . 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 Recurrent pregnancy loss with current pregnancy 10/22/2016    05/21/2015 Negative coagulopathy work up (Lupus anticoagulant, phospholipid antibody, factor V), normal TSH screening   . GAD (generalized anxiety disorder) 09/17/2015  . Major depressive disorder, single episode, moderate ([place]) 09/17/2015  . Smoker 11/22/2014    1/2 ppd no specific efforts to quit   Formatting of this note might be different from the original. Overview:  1/2 ppd no specific efforts to quit   . Recurrent vaginitis 11/22/2014    BV uses flagyl prn   . 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 CD (Crohn's disease) ([place]) 07/30/2014    Diagnosed in 11/ 2015 with pain , diarrhea and bloody stools Followed by Dr Allen Norris  "disease without a location found that small bowel follow-through or upper endoscopy and colonoscopy. The patient has been doing well on steroids but she has been explained that long-term steroid use is not an option for her. The patient will be started on Pentasa to see if her symptoms improve."   Formatting of this note might be different from the original. Overview:  Diagnosed in 11/  2015 with pain , diarrhea and bloody stools Followed by Dr Allen Norris  "disease without a location found that small bowel follow-through or upper endoscopy and colonoscopy. The patient has been doing well on steroids but she has been explained that long-term steroid use is not an option for her. The patient will be started on Pentasa to see if her symptoms improve."  Last Assessment & Plan:  This patient is a 29 year old woman with presumed Crohn's disease. The site of her Crohn disease has not been ascertained despite upper endoscopy colonoscopy and small bowel follow-through. The patient did very well on steroids in the past. The patient has been told that the feasibility of keeping her on steroids long-term have higher risks. The patient will be started back on steroids to get her symptoms under control. The patient is not having diarrhea at the present time and states she is having constipation. The patient has been told to find a primary care provider to ascertain why she keeps having miscarriages. The patient and her mother have been explained the plan and agree with it. Formatting of this note might be different from the original. Overview:  Diagnosed in 11/ 2015 with pain , diarrhea and bloody stools Followed by Dr Allen Norris  "disease without a location found that small bowel follow-through or upper endoscopy and colonoscopy. The patient has been doing well on steroids but she has been explained that long-term steroid use is not an option for her. The patient will be started on Pentasa to see if her symptoms improve."  Last Assessment & Plan:  This patient is a 29 year old woman with presumed Crohn's disease. The site of her Crohn disease has not been ascertained despite upper endoscopy colonoscopy and small bowel follow-through. The patient did very well on steroids in the past. The patient has been told that the feasibility of keeping her on steroids long-term have higher risks. The patient will be  started back on steroids to get her symptoms under control. The patient is not having diarrhea at the present time and states she is having constipation. The patient has been told to find a primary care provider to ascertain why she keeps having miscarriages. The patient and her mother have been explained the plan and agree with it. Diagnosed in 11/ 2015 with pain , diarrhea and bloody stools Followed by Dr Allen Norris  "disease without a location found that small bowel follow-through or upper endoscopy and colonoscopy. The patient has been doing well on steroids but she has been explained that long-term steroid use is not an option for her. The patient will be started on Pentasa to see if her symptoms improve."  Last Assessment & Plan:  This patient is a 29 year old woman with  presumed Crohn's disease. The site of her Crohn disease has not been ascertained despite upper endoscopy colonoscopy and small bowel follow-through. The patient did very well on steroids in the past. The patient has been told that the feasibility of keeping her on steroids long-term have higher risks. The patient will be started back on steroids to get her symptoms under control. The patient is not having diarrhea at the present time and states she is having constipation. The patient has been told to find a primary care provider to ascertain why she keeps having miscarriages. The patient and her mother have been explained the plan and agree with it.  Last Assessment & Plan:  Formatting of this note might be different from the original. Patient with reported history of Crohns, diagnosed in 11/ 2015 with pain , diarrhea and bloody stools. Notes from [facility] report that the site of her Crohn disease has not been ascertained despite upper endoscopy colonoscopy and small bowel follow-through. She has been treated with multiple courses of steroids which usually improve symptoms but she has been told in the past that steroids have long  term risks.   Notably, has also had recurrent pregnancy losses but negative workup, including negative coagulopathy work up (Lupus anticoagulant, phospholipid antibody, factor V), normal TSH screening.   Currently, she has tried probiotics, tylenol, heating pads. Tried changing her diet in every way she can imagine. She continues to have diarrhea. She describes mucous-containing stools and steatorhea. She has had 2 CT Abd/pelvis in the last 6 mo with no acute abnormalities. I emphasized that I will likely not find the cause of her symptoms today in clinic when she has seen multiple sub-specialists with extensive lab testing and imaging, but I am happy to work with her to coordinate care, answer acute questions. We discussed our clinic model and she is happy to have a PCP.  - refilled very short course of norco today - per PDMP, appears to fill a few tablets of norco every few months, usually from the ED - referral to GI   . Body mass index (BMI) of 27.0-27.9 in adult 10/03/2013    Past Surgical History:  Past Surgical History:  Procedure Laterality Date  . DILATION AND CURETTAGE OF UTERUS    . DILATION AND EVACUATION N/A 12/02/2016   Procedure: DILATATION AND EVACUATION;  Surgeon: Harlin Heys, MD;  Location: [facility] ORS;  Service: Gynecology;  Laterality: N/A;  . DILATION AND EVACUATION N/A 06/30/2017   Procedure: DILATATION AND EVACUATION;  Surgeon: Harlin Heys, MD;  Location: [facility] ORS;  Service: Gynecology;  Laterality: N/A;  . DILATION AND EVACUATION N/A 12/06/2018   Procedure: SUCTION D&C;  Surgeon: [name], MD;  Location: [facility] ORS;  Service: Gynecology;  Laterality: N/A;  . MYOMECTOMY N/A 04/06/2019   Procedure: MYOMECTOMY VIA LAPAROTOMY;  Surgeon: [name], MD;  Location: [facility] ORS;  Service: Gynecology;  Laterality: N/A;  . TONSILLECTOMY      Gynecologic History:  Patient's last menstrual period was 10/16/2019.  Obstetric History: [id number]  Family  History:  Family History  Problem Relation Age of Onset  . Healthy Mother   . Healthy Father   . Kidney disease Maternal Uncle   . Heart failure Maternal Uncle   . Heart failure Maternal Grandmother   . Hypertension Paternal Grandmother   . Cancer Paternal Grandmother   . Thyroid disease Paternal Aunt   . Cancer Paternal Grandfather   . Mental illness Neg Hx     Social History:  Social History   Socioeconomic History  . Marital status: Married    Spouse name: Not on file  . Number of children: Not on file  . Years of education: Not on file  . Highest education level: Not on file  Occupational History  . Occupation: unemployed  Tobacco Use  . Smoking status: Current Every Day Smoker    Types: Cigars  . Smokeless tobacco: Never Used  . Tobacco comment: black and mild 2-3 cigars a day  Vaping Use  . Vaping Use: Never used  Substance and Sexual Activity  . Alcohol use: Yes    Comment: occasionally  . Drug use: No  . Sexual activity: Yes    Birth control/protection: None  Other Topics Concern  . Not on file  Social History Narrative  . Not on file   Social Determinants of Health   Financial Resource Strain:   . Difficulty of Paying Living Expenses: Not on file  Food Insecurity:   . Worried About Charity fundraiser in the Last Year: Not on file  . Ran Out of Food in the Last Year: Not on file  Transportation Needs:   . Lack of Transportation (Medical): Not on file  . Lack of Transportation (Non-Medical): Not on file  Physical Activity:   . Days of Exercise per Week: Not on file  . Minutes of Exercise per Session: Not on file  Stress:   . Feeling of Stress : Not on file  Social Connections:   . Frequency of Communication with Friends and Family: Not on file  . Frequency of Social Gatherings with Friends and Family: Not on file  . Attends Religious Services: Not on file  . Active Member of Clubs or Organizations: Not on file  . Attends Archivist  Meetings: Not on file  . Marital Status: Not on file  Intimate Partner Violence:   . Fear of Current or Ex-Partner: Not on file  . Emotionally Abused: Not on file  . Physically Abused: Not on file  . Sexually Abused: Not on file    Allergies:  Allergies  Allergen Reactions  . Aspirin Other (See Comments)    Chron's Disease Chron's Disease Other reaction(s): Other (See Comments) Chron's Disease  . Ibuprofen Other (See Comments)    Chron's Disease Chron's Disease Other reaction(s): Other (See Comments), Other (See Comments) Chron's Disease "blow flare" per pt  . Amoxicillin Rash and Other (See Comments)    Has patient had a PCN reaction causing immediate rash, facial/tongue/throat swelling, SOB or lightheadedness with hypotension: Unknown Has patient had a PCN reaction causing severe rash involving mucus membranes or skin necrosis: Unknown Has patient had a PCN reaction that required hospitalization: Unknown Has patient had a PCN reaction occurring within the last 10 years: No If all of the above answers are "NO", then may proceed with Cephalosporin use.  Other reaction(s): Other (See Comments) Has patient had a PCN reaction causing immediate rash, facial/tongue/throat swelling, SOB or lightheadedness with hypotension: Unknown Has patient had a PCN reaction causing severe rash involving mucus membranes or skin necrosis: Unknown Has patient had a PCN reaction that required hospitalization: Unknown Has patient had a PCN reaction occurring within the last 10 years: No If all of the above answers are "NO", then may proceed with Cephalosporin use.    Medications: Prior to Admission medications   Medication Sig Start Date End Date Taking? Authorizing Provider  acetaminophen (TYLENOL) 500 MG tablet Take by mouth.   Yes [provider]  dicyclomine (BENTYL) 10 MG capsule Take 10 mg by mouth 4 (four) times daily. 07/27/19  Yes [provider]  DULoxetine (CYMBALTA) 30  MG capsule Take 1 capsule (30 mg total) by mouth daily. 10/11/19  Yes Ursula Alert, MD  hydrOXYzine (ATARAX/VISTARIL) 25 MG tablet Take 1 tablet (25 mg total) by mouth 3 (three) times daily as needed for anxiety (and sleep). 10/11/19  Yes Ursula Alert, MD  Multiple Vitamins-Minerals (MULTIVITAMIN WITH MINERALS) tablet Take 1 tablet by mouth daily. 08/02/19  Yes Hampton, Burman Blacksmith, PA  polyethylene glycol powder (GLYCOLAX/MIRALAX) 17 GM/SCOOP powder Take by mouth as directed by your [facility] GI provider. 10/02/19  Yes [provider]  simethicone (MYLICON) 811 MG chewable tablet Chew by mouth.  10/02/19  Yes [provider]  traZODone (DESYREL) 100 MG tablet Take 1-1.5 tablets (100-150 mg total) by mouth at bedtime as needed for sleep. 10/11/19  Yes Ursula Alert, MD    Physical Exam Vitals: Blood pressure (!) 106/58, weight 135 lb (61.2 kg), last menstrual period 10/16/2019, unknown if currently breastfeeding.  General: NAD, well nourished appear stated age 34: normocephalic, anicteric Neurologic: Grossly intact Psychiatric: mood appropriate, affect full  Female chaperone present for pelvic portion of the physical exam  Assessment: 29 y.o. [id number] with chronic pelvic pain  Problem List Items Addressed This Visit    None    Visit Diagnoses    Chronic pelvic pain in female    -  Primary   Relevant Orders   US Transvaginal Non-OB       1) We discussed the possible etiologies for pelvic pain in women.  Gynecologic causes may include endometriosis, adenomyosis, pelvic inflammatory disease (PID), ovarian cysts, ovarian or tubal torsion, and in rare case gynecologic malignancy such as cervical, uterine, or ovarian cancer.  In addition thee possibility of non-gynecologic etiologies such as urinary or GI tract pathology or disordered, as well as musculoskeletal problems.  The goal is to complete a basic work up in hopes of identifying the underlying cause which in turn will  dictate treatment.  In the meantime supportive measures such as localized heat, and NSAIDs are reasonable first steps.     - Prescription drug database was not reviewed, UDS was not ordered - Transvaginal ultrasound ordered - Blood work obtained today No  - Cervical cultures No, reviewed  Tx'ed for culture negative PID but no improvement following antibiotic course, endoscopy scheduled for 11/15/2019.  Start norethindrone for cycle suppression  2) Return in about 1 week (around 11/20/2019) for 1-2 week TVUS and follow up gyn visit.   [name], MD, [facility], [facility] 11/13/2019, 11:19 AM

## 2019-11-17 ENCOUNTER — Telehealth: Payer: Medicaid Other | Admitting: Psychiatry

## 2019-11-17 ENCOUNTER — Other Ambulatory Visit: Payer: Self-pay

## 2019-11-17 ENCOUNTER — Encounter: Payer: Self-pay | Admitting: Psychiatry

## 2019-12-11 ENCOUNTER — Ambulatory Visit: Payer: Medicaid Other | Admitting: Obstetrics and Gynecology

## 2019-12-11 ENCOUNTER — Ambulatory Visit: Payer: Medicaid Other

## 2019-12-11 DIAGNOSIS — G8929 Other chronic pain: Secondary | ICD-10-CM

## 2020-01-08 ENCOUNTER — Ambulatory Visit: Payer: Medicaid Other

## 2020-01-08 ENCOUNTER — Ambulatory Visit: Payer: Medicaid Other | Admitting: Obstetrics and Gynecology

## 2020-01-09 ENCOUNTER — Telehealth: Payer: Self-pay

## 2020-01-09 NOTE — Telephone Encounter (Signed)
Pt aware.

## 2020-01-09 NOTE — Telephone Encounter (Signed)
Pt states she thinks she has had a miscarriage Sunday, still having a lot of pain, she u/s appointment is not til Dec 3rd. Can she be seen before that or does she need to go to the ER? Please advise

## 2020-01-09 NOTE — Telephone Encounter (Signed)
ER if she is having a lot of pain

## 2020-01-26 ENCOUNTER — Ambulatory Visit: Payer: Medicaid Other

## 2020-01-26 ENCOUNTER — Ambulatory Visit: Payer: Medicaid Other | Admitting: Obstetrics and Gynecology

## 2020-03-22 ENCOUNTER — Encounter: Payer: Self-pay | Admitting: Emergency Medicine

## 2020-03-22 ENCOUNTER — Emergency Department: Payer: Medicaid Other

## 2020-03-22 ENCOUNTER — Other Ambulatory Visit: Payer: Self-pay

## 2020-03-22 ENCOUNTER — Ambulatory Visit (INDEPENDENT_AMBULATORY_CARE_PROVIDER_SITE_OTHER): Payer: Medicaid Other | Admitting: Obstetrics and Gynecology

## 2020-03-22 ENCOUNTER — Emergency Department
Admission: EM | Admit: 2020-03-22 | Discharge: 2020-03-22 | Disposition: A | Discharge status: Home / Self Care | Payer: Medicaid Other | Attending: Emergency Medicine | Admitting: Emergency Medicine

## 2020-03-22 VITALS — BP 100/72 | Wt 139.0 lb

## 2020-03-22 DIAGNOSIS — O99011 Anemia complicating pregnancy, first trimester: Secondary | ICD-10-CM | POA: Diagnosis not present

## 2020-03-22 DIAGNOSIS — O209 Hemorrhage in early pregnancy, unspecified: Secondary | ICD-10-CM

## 2020-03-22 DIAGNOSIS — O3680X0 Pregnancy with inconclusive fetal viability, not applicable or unspecified: Secondary | ICD-10-CM

## 2020-03-22 DIAGNOSIS — N96 Recurrent pregnancy loss: Secondary | ICD-10-CM

## 2020-03-22 DIAGNOSIS — F17290 Nicotine dependence, other tobacco product, uncomplicated: Secondary | ICD-10-CM | POA: Diagnosis not present

## 2020-03-22 DIAGNOSIS — N939 Abnormal uterine and vaginal bleeding, unspecified: Secondary | ICD-10-CM

## 2020-03-22 DIAGNOSIS — O039 Complete or unspecified spontaneous abortion without complication: Secondary | ICD-10-CM | POA: Diagnosis not present

## 2020-03-22 DIAGNOSIS — Z3A01 Less than 8 weeks gestation of pregnancy: Secondary | ICD-10-CM | POA: Insufficient documentation

## 2020-03-22 DIAGNOSIS — D649 Anemia, unspecified: Secondary | ICD-10-CM

## 2020-03-22 LAB — CHLAMYDIA/NGC RT PCR (ARMC ONLY)
Chlamydia Tr: NOT DETECTED
N gonorrhoeae: NOT DETECTED

## 2020-03-22 LAB — CBC WITH DIFFERENTIAL/PLATELET
Abs Immature Granulocytes: 0.06 10*3/uL (ref 0.00–0.07)
Basophils Absolute: 0.1 10*3/uL (ref 0.0–0.1)
Basophils Relative: 1 %
Eosinophils Absolute: 0.5 10*3/uL (ref 0.0–0.5)
Eosinophils Relative: 3 %
HCT: 40.9 % (ref 36.0–46.0)
Hemoglobin: 14.3 g/dL (ref 12.0–15.0)
Immature Granulocytes: 0 %
Lymphocytes Relative: 27 %
Lymphs Abs: 5.2 10*3/uL — ABNORMAL HIGH (ref 0.7–4.0)
MCH: 31.8 pg (ref 26.0–34.0)
MCHC: 35 g/dL (ref 30.0–36.0)
MCV: 91.1 fL (ref 80.0–100.0)
Monocytes Absolute: 1.1 10*3/uL — ABNORMAL HIGH (ref 0.1–1.0)
Monocytes Relative: 6 %
Neutro Abs: 12.1 10*3/uL — ABNORMAL HIGH (ref 1.7–7.7)
Neutrophils Relative %: 63 %
Platelets: 255 10*3/uL (ref 150–400)
RBC: 4.49 MIL/uL (ref 3.87–5.11)
RDW: 13.2 % (ref 11.5–15.5)
Smear Review: NORMAL
WBC: 19.1 10*3/uL — ABNORMAL HIGH (ref 4.0–10.5)
nRBC: 0 % (ref 0.0–0.2)

## 2020-03-22 LAB — BASIC METABOLIC PANEL
Anion gap: 9 (ref 5–15)
BUN: 10 mg/dL (ref 6–20)
CO2: 23 mmol/L (ref 22–32)
Calcium: 9.5 mg/dL (ref 8.9–10.3)
Chloride: 106 mmol/L (ref 98–111)
Creatinine, Ser: 0.5 mg/dL (ref 0.44–1.00)
GFR, Estimated: >60 mL/min (ref >60)
Glucose, Bld: 99 mg/dL (ref 70–99)
Potassium: 3.6 mmol/L (ref 3.5–5.1)
Sodium: 138 mmol/L (ref 135–145)

## 2020-03-22 LAB — HCG, QUANTITATIVE, PREGNANCY: hCG, Beta Chain, Quant, S: 144 m[IU]/mL — ABNORMAL HIGH (ref <5)

## 2020-03-22 LAB — HEMOGLOBIN AND HEMATOCRIT, BLOOD
HCT: 36 % (ref 36.0–46.0)
Hemoglobin: 12.3 g/dL (ref 12.0–15.0)

## 2020-03-22 LAB — WET PREP, GENITAL
Clue Cells Wet Prep HPF POC: NONE SEEN
Sperm: NONE SEEN
Trich, Wet Prep: NONE SEEN
Yeast Wet Prep HPF POC: NONE SEEN

## 2020-03-22 LAB — POC URINE PREG, ED: Preg Test, Ur: POSITIVE — AB

## 2020-03-22 LAB — ABO/RH: ABO/RH(D): A POS

## 2020-03-22 MED ORDER — ONDANSETRON HCL 4 MG/2ML IJ SOLN
INTRAMUSCULAR | Status: AC
  Administered 2020-03-22: 4 mg via INTRAVENOUS
  Filled 2020-03-22: qty 2

## 2020-03-22 MED ORDER — FENTANYL CITRATE (PF) 100 MCG/2ML IJ SOLN
50.0000 ug | Freq: Once | INTRAMUSCULAR | Status: AC
  Administered 2020-03-22: 50 ug via INTRAVENOUS
  Filled 2020-03-22: qty 2

## 2020-03-22 MED ORDER — LACTATED RINGERS IV BOLUS
1000.0000 mL | Freq: Once | INTRAVENOUS | Status: AC
  Administered 2020-03-22: 1000 mL via INTRAVENOUS

## 2020-03-22 MED ORDER — KETOROLAC TROMETHAMINE 30 MG/ML IJ SOLN
15.0000 mg | Freq: Once | INTRAMUSCULAR | Status: AC
  Administered 2020-03-22: 15 mg via INTRAVENOUS
  Filled 2020-03-22: qty 1

## 2020-03-22 MED ORDER — ONDANSETRON HCL 4 MG/2ML IJ SOLN: 4.0000 mg | Freq: Once | INTRAMUSCULAR | Status: AC

## 2020-03-22 MED ORDER — HYDROCODONE-ACETAMINOPHEN 5-325 MG PO TABS: 1.0000 | ORAL_TABLET | Freq: Four times a day (QID) | ORAL | 0 refills | Status: DC | PRN

## 2020-03-22 MED ORDER — FENTANYL CITRATE (PF) 100 MCG/2ML IJ SOLN
25.0000 ug | Freq: Once | INTRAMUSCULAR | Status: AC
Start: 2020-03-22 — End: 2020-03-22
  Administered 2020-03-22: 25 ug via INTRAVENOUS
  Filled 2020-03-22: qty 2

## 2020-03-22 NOTE — ED Triage Notes (Signed)
Pt comes into the ED via POV c/o vaginal bleeding at 6/[redacted] weeks pregnant.  Pt is concerned she may be having a miscarriage.  Pt states she is having diarrhea.  Pt states she has had many miscarriages.  Pt also states she is having lower abdominal cramping.

## 2020-03-22 NOTE — ED Notes (Signed)
Pt refusing [place] at this time until being seen by MD for pain management.

## 2020-03-22 NOTE — Progress Notes (Signed)
Obstetric Problem Visit    Chief Complaint:  Chief Complaint  Patient presents with  . Vaginal Bleeding    Seen @ [facility] ED today, heavy bleeding w/clots. RM 4    History of Present Illness: Patient is a 30 y.o. [id number] Unknown presenting for first trimester bleeding.  The onset of bleeding was .  Is bleeding equal to or greater than normal menstrual flow:  Yes Any recent trauma:  No Recent intercourse:  No History of prior miscarriage:  Yes Prior ultrasound demonstrating IUP: none.  Prior ultrasound demonstrating viable IUP:  No Prior Serum HCG:  Yes Rh status: A POS Performed at [facility], [address] [place], [place] [address]    Review of Systems: Review of Systems  Constitutional: Negative.   Gastrointestinal: Positive for abdominal pain. Negative for nausea and vomiting.  Genitourinary: Negative.     Past Medical History:  Patient Active Problem List   Diagnosis Date Noted  . PTSD (post-traumatic stress disorder) 10/11/2019  . Depression 10/11/2019  . Social anxiety disorder 10/11/2019  . Tobacco use disorder 10/11/2019  . Uterine fibroid 04/06/2019  . Supervision of high risk pregnancy, antepartum 11/24/2018    [facility] Prenatal Labs  Dating  Blood type: A pos  Genetic Screen 1 Screen:    AFP:     Quad:     NIPS: Antibody: negative  Anatomic [place]  Rubella: Immune Varicella: Immune  GTT Early:               Third trimester:  RPR: NR  Rhogam  HBsAg: negative  TDaP vaccine                       Flu Shot: HIV: negative  Baby Food                                GBS:   Contraception  Pap: 11/22/2018  CBB   Hgb AA  CS/VBAC    Support Person         . UTI (urinary tract infection) during pregnancy 07/19/2018    E Coli on initial visit   . 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 Ileus ([place]) 12/01/2017  . Abdominal pain  12/01/2017  . C. difficile colitis 07/23/2017  . 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 Recurrent pregnancy loss with current pregnancy 10/22/2016    05/21/2015 Negative coagulopathy work up (Lupus anticoagulant, phospholipid antibody, factor V), normal TSH screening   . GAD (generalized anxiety disorder) 09/17/2015  . Major depressive disorder, single episode, moderate ([place]) 09/17/2015  . Smoker 11/22/2014    1/2 ppd no specific efforts to quit   Formatting of this note might be different from the original. Overview:  1/2 ppd no specific efforts to quit   . Recurrent vaginitis 11/22/2014    BV uses flagyl prn   . 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 CD (Crohn's disease) ([place]) 07/30/2014    Diagnosed in 11/ 2015 with pain , diarrhea and bloody stools Followed by Dr Allen Norris  "disease without a location found that small bowel follow-through or upper endoscopy and colonoscopy. The patient has been doing well on steroids but she has been explained that long-term steroid use is not an option for  her. The patient will be started on Pentasa to see if her symptoms improve."   Formatting of this note might be different from the original. Overview:  Diagnosed in 11/ 2015 with pain , diarrhea and bloody stools Followed by Dr Allen Norris  "disease without a location found that small bowel follow-through or upper endoscopy and colonoscopy. The patient has been doing well on steroids but she has been explained that long-term steroid use is not an option for her. The patient will be started on Pentasa to see if her symptoms improve."  Last Assessment & Plan:  This patient is a 30 year old woman with presumed Crohn's disease. The site of her Crohn disease has not been ascertained despite upper endoscopy colonoscopy and small bowel follow-through. The patient did very well on steroids in the past. The patient has been told that the feasibility of keeping  her on steroids long-term have higher risks. The patient will be started back on steroids to get her symptoms under control. The patient is not having diarrhea at the present time and states she is having constipation. The patient has been told to find a primary care provider to ascertain why she keeps having miscarriages. The patient and her mother have been explained the plan and agree with it. Formatting of this note might be different from the original. Overview:  Diagnosed in 11/ 2015 with pain , diarrhea and bloody stools Followed by Dr Allen Norris  "disease without a location found that small bowel follow-through or upper endoscopy and colonoscopy. The patient has been doing well on steroids but she has been explained that long-term steroid use is not an option for her. The patient will be started on Pentasa to see if her symptoms improve."  Last Assessment & Plan:  This patient is a 29 year old woman with presumed Crohn's disease. The site of her Crohn disease has not been ascertained despite upper endoscopy colonoscopy and small bowel follow-through. The patient did very well on steroids in the past. The patient has been told that the feasibility of keeping her on steroids long-term have higher risks. The patient will be started back on steroids to get her symptoms under control. The patient is not having diarrhea at the present time and states she is having constipation. The patient has been told to find a primary care provider to ascertain why she keeps having miscarriages. The patient and her mother have been explained the plan and agree with it. Diagnosed in 11/ 2015 with pain , diarrhea and bloody stools Followed by Dr Allen Norris  "disease without a location found that small bowel follow-through or upper endoscopy and colonoscopy. The patient has been doing well on steroids but she has been explained that long-term steroid use is not an option for her. The patient will be started on Pentasa to see if her  symptoms improve."  Last Assessment & Plan:  This patient is a 30 year old woman with presumed Crohn's disease. The site of her Crohn disease has not been ascertained despite upper endoscopy colonoscopy and small bowel follow-through. The patient did very well on steroids in the past. The patient has been told that the feasibility of keeping her on steroids long-term have higher risks. The patient will be started back on steroids to get her symptoms under control. The patient is not having diarrhea at the present time and states she is having constipation. The patient has been told to find a primary care provider to ascertain why she keeps having miscarriages. The patient and her mother  have been explained the plan and agree with it.  Last Assessment & Plan:  Formatting of this note might be different from the original. Patient with reported history of Crohns, diagnosed in 11/ 2015 with pain , diarrhea and bloody stools. Notes from [facility] report that the site of her Crohn disease has not been ascertained despite upper endoscopy colonoscopy and small bowel follow-through. She has been treated with multiple courses of steroids which usually improve symptoms but she has been told in the past that steroids have long term risks.   Notably, has also had recurrent pregnancy losses but negative workup, including negative coagulopathy work up (Lupus anticoagulant, phospholipid antibody, factor V), normal TSH screening.   Currently, she has tried probiotics, tylenol, heating pads. Tried changing her diet in every way she can imagine. She continues to have diarrhea. She describes mucous-containing stools and steatorhea. She has had 2 CT Abd/pelvis in the last 6 mo with no acute abnormalities. I emphasized that I will likely not find the cause of her symptoms today in clinic when she has seen multiple sub-specialists with extensive lab testing and imaging, but I am happy to work with her to coordinate care,  answer acute questions. We discussed our clinic model and she is happy to have a PCP.  - refilled very short course of norco today - per PDMP, appears to fill a few tablets of norco every few months, usually from the ED - referral to GI   . Body mass index (BMI) of 27.0-27.9 in adult 10/03/2013    Past Surgical History:  Past Surgical History:  Procedure Laterality Date  . DILATION AND CURETTAGE OF UTERUS    . DILATION AND EVACUATION N/A 12/02/2016   Procedure: DILATATION AND EVACUATION;  Surgeon: Harlin Heys, MD;  Location: [facility] ORS;  Service: Gynecology;  Laterality: N/A;  . DILATION AND EVACUATION N/A 06/30/2017   Procedure: DILATATION AND EVACUATION;  Surgeon: Harlin Heys, MD;  Location: [facility] ORS;  Service: Gynecology;  Laterality: N/A;  . DILATION AND EVACUATION N/A 12/06/2018   Procedure: SUCTION D&C;  Surgeon: [name], MD;  Location: [facility] ORS;  Service: Gynecology;  Laterality: N/A;  . MYOMECTOMY N/A 04/06/2019   Procedure: MYOMECTOMY VIA LAPAROTOMY;  Surgeon: [name], MD;  Location: [facility] ORS;  Service: Gynecology;  Laterality: N/A;  . TONSILLECTOMY      Obstetric History: [id number]  Family History:  Family History  Problem Relation Age of Onset  . Healthy Mother   . Healthy Father   . Kidney disease Maternal Uncle   . Heart failure Maternal Uncle   . Heart failure Maternal Grandmother   . Hypertension Paternal Grandmother   . Cancer Paternal Grandmother   . Thyroid disease Paternal Aunt   . Cancer Paternal Grandfather   . Mental illness Neg Hx     Social History:  Social History   Socioeconomic History  . Marital status: Married    Spouse name: Not on file  . Number of children: Not on file  . Years of education: Not on file  . Highest education level: Not on file  Occupational History  . Occupation: unemployed  Tobacco Use  . Smoking status: Current Every Day Smoker    Types: Cigars  . Smokeless tobacco: Never Used  .  Tobacco comment: black and mild 2-3 cigars a day  Vaping Use  . Vaping Use: Never used  Substance and Sexual Activity  . Alcohol use: Yes    Comment: occasionally  . Drug  use: No  . Sexual activity: Yes    Birth control/protection: None  Other Topics Concern  . Not on file  Social History Narrative  . Not on file   Social Determinants of Health   Financial Resource Strain: Not on file  Food Insecurity: Not on file  Transportation Needs: Not on file  Physical Activity: Not on file  Stress: Not on file  Social Connections: Not on file  Intimate Partner Violence: Not on file    Allergies:  Allergies  Allergen Reactions  . Aspirin Other (See Comments)    Chron's Disease Chron's Disease Other reaction(s): Other (See Comments) Chron's Disease  . Ibuprofen Other (See Comments)    Chron's Disease Chron's Disease Other reaction(s): Other (See Comments), Other (See Comments) Chron's Disease "blow flare" per pt  . Amoxicillin Rash and Other (See Comments)    Has patient had a PCN reaction causing immediate rash, facial/tongue/throat swelling, SOB or lightheadedness with hypotension: Unknown Has patient had a PCN reaction causing severe rash involving mucus membranes or skin necrosis: Unknown Has patient had a PCN reaction that required hospitalization: Unknown Has patient had a PCN reaction occurring within the last 10 years: No If all of the above answers are "NO", then may proceed with Cephalosporin use.  Other reaction(s): Other (See Comments) Has patient had a PCN reaction causing immediate rash, facial/tongue/throat swelling, SOB or lightheadedness with hypotension: Unknown Has patient had a PCN reaction causing severe rash involving mucus membranes or skin necrosis: Unknown Has patient had a PCN reaction that required hospitalization: Unknown Has patient had a PCN reaction occurring within the last 10 years: No If all of the above answers are "NO", then may proceed with  Cephalosporin use.    Medications: Prior to Admission medications   Medication Sig Start Date End Date Taking? Authorizing Provider  HYDROcodone-acetaminophen (NORCO/VICODIN) 5-325 MG tablet Take 1 tablet by mouth every 6 (six) hours as needed. 03/22/20  Yes [name], MD  acetaminophen (TYLENOL) 500 MG tablet Take by mouth.    [provider]  dicyclomine (BENTYL) 10 MG capsule Take 10 mg by mouth 4 (four) times daily. 07/27/19   [provider]  DULoxetine (CYMBALTA) 30 MG capsule Take 1 capsule (30 mg total) by mouth daily. 10/11/19   Ursula Alert, MD  hydrOXYzine (ATARAX/VISTARIL) 25 MG tablet Take 1 tablet (25 mg total) by mouth 3 (three) times daily as needed for anxiety (and sleep). 10/11/19   Ursula Alert, MD  Multiple Vitamins-Minerals (MULTIVITAMIN WITH MINERALS) tablet Take 1 tablet by mouth daily. 08/02/19   Jerene Dilling, PA  norethindrone (AYGESTIN) 5 MG tablet Take 1 tablet (5 mg total) by mouth daily. 11/13/19   [name], MD  polyethylene glycol powder [facility]) 17 GM/SCOOP powder Take by mouth as directed by your [facility] GI provider. 10/02/19   [provider]  simethicone (MYLICON) 294 MG chewable tablet Chew by mouth.  10/02/19   [provider]  traZODone (DESYREL) 100 MG tablet Take 1-1.5 tablets (100-150 mg total) by mouth at bedtime as needed for sleep. 10/11/19   Ursula Alert, MD    Physical Exam Vitals: Blood pressure 100/72, weight 139 lb (63 kg), last menstrual period 10/16/2019, unknown if currently breastfeeding. General: NAD HEENT: normocephalic, anicteric Neck: Thyroid non-enlarged, no nodules Pulmonary: No increased work of breathing, Extremities: no edema, erythema, or tenderness Neurologic: Grossly intact Psychiatric: mood appropriate, affect full  US OB LESS THAN 14 WEEKS WITH OB TRANSVAGINAL  Result Date: 03/22/2020 CLINICAL DATA:  Vaginal bleeding.  Positive pregnancy test. EXAM: OBSTETRIC <14  WK [place] AND TRANSVAGINAL OB US TECHNIQUE: Both transabdominal and transvaginal ultrasound examinations were performed for complete evaluation of the gestation as well as the maternal uterus, adnexal regions, and pelvic cul-de-sac. Transvaginal technique was performed to assess early pregnancy. COMPARISON:  None. FINDINGS: Intrauterine gestational sac: None Maternal uterus/adnexae: Endometrial thickness measures 3 mm. Both ovaries are normal in appearance. No mass or abnormal free fluid identified. IMPRESSION: Pregnancy of unknown anatomic location (no intrauterine gestational sac or adnexal mass identified). Differential diagnosis includes recent spontaneous abortion, IUP too early to visualize, and non-visualized ectopic pregnancy. Recommend correlation with serial beta-hCG levels, and follow up US if warranted clinically. Electronically Signed   By: Marlaine Hind M.D.   On: 03/22/2020 12:59    Assessment: 30 y.o. [id number] Unknown presenting for evaluation of first trimester vaginal bleeding  Plan: Problem List Items Addressed This Visit   None   Visit Diagnoses    Pregnancy of unknown anatomic location    -  Primary   Relevant Orders   Beta hCG quant (ref lab)   Pregnancy with inconclusive fetal viability, single or unspecified fetus       Relevant Orders   Beta hCG quant (ref lab)   First trimester bleeding       Relevant Orders   Beta hCG quant (ref lab)      1) First trimester bleeding - incidence and clinical course of first trimester bleeding is discussed in detail with the patient today.  Approximately 1/3 of pregnancies ending in live births experienced 1st trimester bleeding.  The amount of bleeding is variable and not necessarily predictive of outcome.  Sources may be cervical or uterine.  Subchorionic hemorrhages are a frequent concurrent findings on ultrasound and are followed expectantly.  These often absorb or regress spontaneously although risk for expansion and further disruption  of the utero-placental interface leading to miscarriage is possible.  There is no clearly documented benefit to limiting or modifying activity and sexual intercourse in altering clinic course of 1st trimester bleeding.    2) If no already done will proceed with TVUS evaluation to document viability, and if uncertain viability or absence of a demonstrable IUP (and no previous documentation of IUP) will trend HCG levels.  3) The patient is A POS Performed at [facility], [address] [place], [place] [address]   rhogam is therefore not indicated to decrease the risk rhesus alloimmunization.    4) Routine bleeding precautions were discussed with the patient prior the conclusion of today's visit.  5) repeat HCG 2/1, referral to [facility] for recurrent miscarriage   [name], MD, [place], [facility] 03/22/2020, 4:34 PM

## 2020-03-22 NOTE — ED Provider Notes (Signed)
[facility] Emergency Department Provider Note  ____________________________________________   Event Date/Time   First MD Initiated Contact with Patient 03/22/20 1054     (approximate)  I have reviewed the triage vital signs and the nursing notes.   HISTORY  Chief Complaint Vaginal Bleeding   HPI Kathleen Owen is a 30 y.o. female [phone] with a past medical history of Crohn's disease, depression, and as noted multiple prior miscarriages never occurred past 8 to 10 weeks who presents for assessment approximately 6 to [redacted] weeks pregnant per last LMP for assessment of some lower abdominal crampy pain associated with diarrhea and passage of some blood clots that started this morning.  Patient states she thinks is a miscarriage and feels very similar to prior miscarriages use in the past.  Denies any headache, earache, sore throat, fevers, chills, cough, chest pain, or back pain, rash or extremity pain or other clear acute complaints.  She does endorse some mild bilateral lower abdominal pain, back pain and malaise.  No dysuria or blood in her stool or urine.  No recent trauma or injuries.         Past Medical History:  Diagnosis Date  . 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 CD (Crohn's disease) ([place]) 07/30/2014  . Crohn's disease [facility]) Jan 2016  . Depression   . Miscarriage 07/18/2014    Patient Active Problem List   Diagnosis Date Noted  . PTSD (post-traumatic stress disorder) 10/11/2019  . Depression 10/11/2019  . Social anxiety disorder 10/11/2019  . Tobacco use disorder 10/11/2019  . Uterine fibroid 04/06/2019  . Supervision of high risk pregnancy, antepartum 11/24/2018  . UTI (urinary tract infection) during pregnancy 07/19/2018  . 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 Ileus ([place]) 12/01/2017  . Abdominal pain 12/01/2017  . C. difficile colitis 07/23/2017  . 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 GAD (generalized anxiety disorder) 09/17/2015  . Major depressive disorder, single episode, moderate ([place]) 09/17/2015  . Smoker 11/22/2014  . Recurrent vaginitis 11/22/2014  . 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 CD (Crohn's disease) ([place]) 07/30/2014  . Body mass index (BMI) of 27.0-27.9 in adult 10/03/2013    Past Surgical History:  Procedure Laterality Date  . DILATION AND CURETTAGE OF UTERUS    . DILATION AND EVACUATION N/A 12/02/2016   Procedure: DILATATION AND EVACUATION;  Surgeon: Harlin Heys, MD;  Location: [facility] ORS;  Service: Gynecology;  Laterality: N/A;  . DILATION AND EVACUATION N/A 06/30/2017   Procedure: DILATATION AND EVACUATION;  Surgeon: Harlin Heys, MD;  Location: [facility] ORS;  Service: Gynecology;  Laterality: N/A;  . DILATION AND EVACUATION N/A 12/06/2018   Procedure: SUCTION D&C;  Surgeon: [name], MD;  Location: [facility] ORS;  Service: Gynecology;  Laterality: N/A;  . MYOMECTOMY N/A 04/06/2019   Procedure: MYOMECTOMY VIA LAPAROTOMY;  Surgeon: [name], MD;  Location: [facility] ORS;  Service: Gynecology;  Laterality: N/A;  . TONSILLECTOMY      Prior to Admission medications   Medication Sig Start Date End Date Taking? Authorizing Provider  acetaminophen (TYLENOL) 500 MG tablet Take by mouth.    [provider]  dicyclomine (BENTYL) 10 MG capsule Take 10 mg by mouth 4 (four) times daily. 07/27/19   [provider]  DULoxetine (CYMBALTA) 30 MG capsule Take 1 capsule (30 mg total) by mouth daily. 10/11/19   Ursula Alert, MD  hydrOXYzine (ATARAX/VISTARIL)  25 MG tablet Take 1 tablet (25 mg total) by mouth 3 (three) times daily as needed for anxiety (and sleep). 10/11/19   Ursula Alert, MD  Multiple Vitamins-Minerals (MULTIVITAMIN WITH MINERALS) tablet Take 1 tablet by mouth daily. 08/02/19   Jerene Dilling, PA  norethindrone (AYGESTIN) 5 MG tablet Take 1 tablet (5 mg  total) by mouth daily. 11/13/19   [name], MD  polyethylene glycol powder [facility]) 17 GM/SCOOP powder Take by mouth as directed by your [facility] GI provider. 10/02/19   [provider]  simethicone (MYLICON) 062 MG chewable tablet Chew by mouth.  10/02/19   [provider]  traZODone (DESYREL) 100 MG tablet Take 1-1.5 tablets (100-150 mg total) by mouth at bedtime as needed for sleep. 10/11/19   Ursula Alert, MD    Allergies Aspirin, Ibuprofen, and Amoxicillin  Family History  Problem Relation Age of Onset  . Healthy Mother   . Healthy Father   . Kidney disease Maternal Uncle   . Heart failure Maternal Uncle   . Heart failure Maternal Grandmother   . Hypertension Paternal Grandmother   . Cancer Paternal Grandmother   . Thyroid disease Paternal Aunt   . Cancer Paternal Grandfather   . Mental illness Neg Hx     Social History Social History   Tobacco Use  . Smoking status: Current Every Day Smoker    Types: Cigars  . Smokeless tobacco: Never Used  . Tobacco comment: black and mild 2-3 cigars a day  Vaping Use  . Vaping Use: Never used  Substance Use Topics  . Alcohol use: Yes    Comment: occasionally  . Drug use: No    Review of Systems  Review of Systems  Constitutional: Negative for chills and fever.  HENT: Negative for sore throat.   Eyes: Negative for pain.  Respiratory: Negative for cough and stridor.   Cardiovascular: Negative for chest pain.  Gastrointestinal: Positive for abdominal pain and diarrhea. Negative for vomiting.  Genitourinary: Negative for dysuria.  Musculoskeletal: Positive for back pain.  Skin: Negative for rash.  Neurological: Negative for seizures, loss of consciousness and headaches.  Psychiatric/Behavioral: Negative for suicidal ideas.  All other systems reviewed and are negative.     ____________________________________________   PHYSICAL EXAM:  VITAL SIGNS: ED Triage Vitals  Enc Vitals Group      BP 03/22/20 0750 112/86     Pulse Rate 03/22/20 0750 (!) 103     Resp 03/22/20 0750 18     Temp 03/22/20 0750 99.9 F (37.7 C)     Temp Source 03/22/20 0750 Oral     SpO2 03/22/20 0750 99 %     Weight 03/22/20 0728 145 lb (65.8 kg)     Height 03/22/20 0728 5' 3"  (1.6 m)     Head Circumference --      Peak Flow --      Pain Score 03/22/20 0728 8     Pain Loc --      Pain Edu? --      Excl. in [place]? --    Vitals:   03/22/20 1215 03/22/20 1339  BP:  99/86  Pulse: 67 72  Resp:  18  Temp:    SpO2: 100% 100%   Physical Exam Vitals and nursing note reviewed. Exam conducted with a chaperone present.  Constitutional:      General: She is not in acute distress.    Appearance: She is well-developed and well-nourished.  HENT:     Head:  Normocephalic and atraumatic.     Right Ear: Tympanic membrane and external ear normal.     Left Ear: Tympanic membrane and external ear normal.     Nose: Nose normal.  Eyes:     Conjunctiva/sclera: Conjunctivae normal.  Cardiovascular:     Rate and Rhythm: Normal rate and regular rhythm.     Pulses: Normal pulses.     Heart sounds: No murmur heard.   Pulmonary:     Effort: Pulmonary effort is normal. No respiratory distress.     Breath sounds: Normal breath sounds.  Abdominal:     Palpations: Abdomen is soft.     Tenderness: There is no abdominal tenderness.  Genitourinary:    Cervix: No cervical motion tenderness or friability.     Comments: Scant mucus appearing discharge with some red clots passing from close cervical os. Vaginal vault otherwise unremarkable. No other active bleeding. Musculoskeletal:        General: No edema.     Cervical back: Neck supple.  Skin:    General: Skin is warm and dry.     Capillary Refill: Capillary refill takes less than 2 seconds.  Neurological:     Mental Status: She is alert and oriented to person, place, and time.  Psychiatric:        Mood and Affect: Mood and affect and mood normal.      _________________________________________   LABS (all labs ordered are listed, but only abnormal results are displayed)  Labs Reviewed  WET PREP, GENITAL - Abnormal; Notable for the following components:      Result Value   WBC, Wet Prep HPF POC MANY (*)    All other components within normal limits  HCG, QUANTITATIVE, PREGNANCY - Abnormal; Notable for the following components:   hCG, Beta Chain, Quant, S 144 (*)    All other components within normal limits  CBC WITH DIFFERENTIAL/PLATELET - Abnormal; Notable for the following components:   WBC 19.1 (*)    Neutro Abs 12.1 (*)    Lymphs Abs 5.2 (*)    Monocytes Absolute 1.1 (*)    All other components within normal limits  POC URINE PREG, ED - Abnormal; Notable for the following components:   Preg Test, Ur POSITIVE (*)    All other components within normal limits  CHLAMYDIA/NGC RT PCR ([facility] ONLY)  BASIC METABOLIC PANEL  HEMOGLOBIN AND HEMATOCRIT, BLOOD  ABO/RH  SURGICAL PATHOLOGY  SURGICAL PATHOLOGY   ____________________________________________  ____________________________________________  RADIOLOGY  ED MD interpretation: No clearly visualized ectopic or IUP. Otherwise unremarkable.  Official radiology report(s): US OB LESS THAN 14 WEEKS WITH OB TRANSVAGINAL  Result Date: 03/22/2020 CLINICAL DATA:  Vaginal bleeding.  Positive pregnancy test. EXAM: OBSTETRIC <14 WK [place] AND TRANSVAGINAL OB US TECHNIQUE: Both transabdominal and transvaginal ultrasound examinations were performed for complete evaluation of the gestation as well as the maternal uterus, adnexal regions, and pelvic cul-de-sac. Transvaginal technique was performed to assess early pregnancy. COMPARISON:  None. FINDINGS: Intrauterine gestational sac: None Maternal uterus/adnexae: Endometrial thickness measures 3 mm. Both ovaries are normal in appearance. No mass or abnormal free fluid identified. IMPRESSION: Pregnancy of unknown anatomic location (no  intrauterine gestational sac or adnexal mass identified). Differential diagnosis includes recent spontaneous abortion, IUP too early to visualize, and non-visualized ectopic pregnancy. Recommend correlation with serial beta-hCG levels, and follow up US if warranted clinically. Electronically Signed   By: Marlaine Hind M.D.   On: 03/22/2020 12:59    ____________________________________________   PROCEDURES  Procedure(s) performed (including Critical Care):  .1-3 Lead EKG Interpretation Performed by: Lucrezia Starch, MD Authorized by: Lucrezia Starch, MD     Interpretation: normal     ECG rate assessment: normal     Rhythm: sinus rhythm     Ectopy: none     Conduction: normal       ____________________________________________   INITIAL IMPRESSION / ASSESSMENT AND PLAN / ED COURSE      Patient presents with [place] to history exam for assessment of some diarrhea and pelvic pain rating to the back associated with passage of some blood clots and a small piece of tissue today in the setting of being approximately [redacted] weeks pregnant by last LMP. She is afebrile hemodynamically stable.  She does on exam have evidence of some clot passage although no significant active bleeding. No evidence of infection have a low suspicion for PID or TOA at this time. Also very low suspicion for torsion given overall description of symptoms with bleeding and passage of tissue. Patient states she is approximately [redacted] weeks pregnant by last LMP her hCG is 144 most consistent with likely missed or complete abortion. Do not believe patient is septic and have low suspicion for other acute or immediate life-threatening intra-abdominal pelvic pathology including appendicitis, diverticulitis, SBO, pancreatitis or cholecystitis. BMP shows no significant metabolic derangements. CBC shows leukocytosis with WBC count of 19.1 with no significant anemia and unremarkable platelets. I suspect this is reactive in the setting of  miscarriage. Patient is Rh+. No indication for RhoGam. Wet prep was unremarkable and GC studies were sent. Repeat H&H sent shows a hemoglobin of 12.3 which is slightly downtrending but no indication for transfusion or other immediate intervention at this time. Patient given below noted analgesia. She does have an OB/GYN appointment scheduled for later today and advised her to keep this appointment. Impression is likely miscarriage with incomplete abortion. Patient discharged stable condition. Strict return precautions advised discussed. Advised patient to take OTC iron supplementation given her anemia.   ____________________________________________   FINAL CLINICAL IMPRESSION(S) / ED DIAGNOSES  Final diagnoses:  Miscarriage  Anemia, unspecified type    Medications  fentaNYL (SUBLIMAZE) injection 50 mcg (50 mcg Intravenous Given 03/22/20 1147)  ondansetron (ZOFRAN) injection 4 mg (4 mg Intravenous Given 03/22/20 1154)  lactated ringers bolus 1,000 mL (0 mLs Intravenous Stopped 03/22/20 1357)  fentaNYL (SUBLIMAZE) injection 25 mcg (25 mcg Intravenous Given 03/22/20 1301)  ketorolac (TORADOL) 30 MG/ML injection 15 mg (15 mg Intravenous Given 03/22/20 1410)     ED Discharge Orders    None       Note:  This document was prepared using Dragon voice recognition software and may include unintentional dictation errors.   Lucrezia Starch, MD 03/22/20 [phone]

## 2020-03-26 ENCOUNTER — Other Ambulatory Visit: Payer: Medicaid Other

## 2020-03-28 ENCOUNTER — Other Ambulatory Visit: Payer: Medicaid Other

## 2020-03-28 LAB — SURGICAL PATHOLOGY

## 2020-03-29 ENCOUNTER — Encounter: Payer: Self-pay | Admitting: Obstetrics and Gynecology

## 2020-03-29 ENCOUNTER — Other Ambulatory Visit: Payer: Medicaid Other

## 2020-03-29 NOTE — Progress Notes (Unsigned)
Spoke with Dr. 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

## 2020-04-25 ENCOUNTER — Telehealth: Payer: Self-pay

## 2020-04-25 NOTE — Telephone Encounter (Signed)
Pt calling; still hasn't had a period; LMP 02/08/20; breasts are sore; wonders if she completed miscarriage or if something else is going on.  [phone]

## 2020-06-25 ENCOUNTER — Other Ambulatory Visit: Payer: Self-pay

## 2020-06-25 ENCOUNTER — Ambulatory Visit: Payer: Medicaid Other

## 2020-06-25 ENCOUNTER — Ambulatory Visit: Payer: Medicaid Other | Admitting: Advanced Practice Midwife

## 2020-06-25 DIAGNOSIS — Z113 Encounter for screening for infections with a predominantly sexual mode of transmission: Secondary | ICD-10-CM

## 2020-06-25 LAB — WET PREP FOR TRICH, YEAST, CLUE
Trichomonas Exam: NEGATIVE
Yeast Exam: NEGATIVE

## 2020-06-25 NOTE — Progress Notes (Signed)
[facility] Department STI clinic/screening visit  Subjective:  Kathleen Owen is a 30 y.o. SBF G24P0 smoker female being seen today for an STI screening visit. The patient reports they do not have symptoms.  Patient reports that they do desire a pregnancy in the next year.   They reported they are not interested in discussing contraception today.  Patient's last menstrual period was 04/26/2020.   Patient has the following medical conditions:   Patient Active Problem List   Diagnosis Date Noted  . PTSD (post-traumatic stress disorder) 10/11/2019  . Depression 10/11/2019  . Social anxiety disorder 10/11/2019  . Tobacco use disorder 10/11/2019  . Uterine fibroid 04/06/2019  . Supervision of high risk pregnancy, antepartum 11/24/2018  . UTI (urinary tract infection) during pregnancy 07/19/2018  . 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 Ileus ([place]) 12/01/2017  . Abdominal pain 12/01/2017  . C. difficile colitis 07/23/2017  . 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 GAD (generalized anxiety disorder) 09/17/2015  . Major depressive disorder, single episode, moderate ([place]) 09/17/2015  . Smoker 11/22/2014  . Recurrent vaginitis 11/22/2014  . 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 CD (Crohn's disease) ([place]) 07/30/2014  . Body mass index (BMI) of 27.0-27.9 in adult 10/03/2013    Chief Complaint  Patient presents with  . SEXUALLY TRANSMITTED DISEASE    screening    HPI  Patient reports no symptoms.  LMP 04/26/20. Last sex 06/23/20 without condom; with current partner x 7 years; 1 sex partner in last 3 mo. Last MJ age 22. Last ETOH 06/23/20 (2 beers) 2x/mo.  Smoker  Last HIV test per patient/review of record was 08/02/19 Patient reports last pap was 11/22/18 neg  See flowsheet for further details and programmatic requirements.    The following portions of the patient's history were reviewed and updated as appropriate:  allergies, current medications, past medical history, past social history, past surgical history and problem list.  Objective:  There were no vitals filed for this visit.  Physical Exam Vitals and nursing note reviewed.  Constitutional:      Appearance: Normal appearance. She is normal weight.  HENT:     Head: Normocephalic and atraumatic.     Comments: Negative cervical lymphadenopathy    Mouth/Throat:     Mouth: Mucous membranes are moist.     Pharynx: Oropharynx is clear. No oropharyngeal exudate or posterior oropharyngeal erythema.  Eyes:     Conjunctiva/sclera: Conjunctivae normal.  Pulmonary:     Effort: Pulmonary effort is normal.  Chest:  Breasts:     Right: No axillary adenopathy or supraclavicular adenopathy.     Left: No axillary adenopathy or supraclavicular adenopathy.    Abdominal:     General: Abdomen is flat.     Palpations: Abdomen is soft. There is no mass.     Tenderness: There is no abdominal tenderness. There is no rebound.     Comments: Soft without masses or tenderness  Genitourinary:    General: Normal vulva.     Exam position: Lithotomy position.     Pubic Area: No rash or pubic lice.      Labia:        Right: No rash or lesion.        Left: No rash or lesion.      Vagina: Vaginal discharge (no leukorrhea, ph<4.5) present. No erythema, bleeding or lesions.     Cervix: Normal.     Uterus: Normal.  Adnexa: Right adnexa normal and left adnexa normal.     Rectum: Normal.  Lymphadenopathy:     Head:     Right side of head: No preauricular or posterior auricular adenopathy.     Left side of head: No preauricular or posterior auricular adenopathy.     Cervical: No cervical adenopathy.     Upper Body:     Right upper body: No supraclavicular or axillary adenopathy.     Left upper body: No supraclavicular or axillary adenopathy.     Lower Body: No right inguinal adenopathy. No left inguinal adenopathy.  Skin:    General: Skin is warm and dry.      Findings: No rash.  Neurological:     Mental Status: She is alert and oriented to person, place, and time.      Assessment and Plan:  Kathleen Owen is a 30 y.o. female presenting to the [facility] Department for STI screening  1. Screening examination for venereal disease Treat wet mount per standing orders Immunization nurse consult - WET PREP FOR [place], YEAST, [place] [facility] - Syphilis Serology, [place] [facility] - Gonococcus culture     No follow-ups on file.  No future appointments.  Herbie Saxon, CNM

## 2020-06-25 NOTE — Progress Notes (Signed)
Wet mount results reviewed.  Treatment not needed, per standing orders. Windle Guard, RN

## 2020-06-29 LAB — GONOCOCCUS CULTURE

## 2020-07-01 LAB — HM HIV SCREENING LAB: HM HIV Screening: NEGATIVE

## 2020-07-04 ENCOUNTER — Other Ambulatory Visit: Payer: Self-pay

## 2020-07-04 ENCOUNTER — Encounter: Payer: Self-pay | Admitting: Emergency Medicine

## 2020-07-04 ENCOUNTER — Emergency Department
Admission: EM | Admit: 2020-07-04 | Discharge: 2020-07-04 | Disposition: A | Discharge status: Home / Self Care | Payer: Medicaid Other | Attending: Emergency Medicine | Admitting: Emergency Medicine

## 2020-07-04 DIAGNOSIS — T887XXA Unspecified adverse effect of drug or medicament, initial encounter: Secondary | ICD-10-CM

## 2020-07-04 DIAGNOSIS — R002 Palpitations: Secondary | ICD-10-CM | POA: Insufficient documentation

## 2020-07-04 DIAGNOSIS — T402X5A Adverse effect of other opioids, initial encounter: Secondary | ICD-10-CM | POA: Insufficient documentation

## 2020-07-04 DIAGNOSIS — F17290 Nicotine dependence, other tobacco product, uncomplicated: Secondary | ICD-10-CM | POA: Diagnosis not present

## 2020-07-04 NOTE — ED Notes (Signed)
Pt denies palpitations at this time, denies CP/SOB; pt in NAD, VSS, resp e/u

## 2020-07-04 NOTE — ED Triage Notes (Signed)
Pt to triage via w/c with no distress noted; pt reports taking nucynta 150m PTA by mistake; c/o "heart racing and feeling shaky"

## 2020-07-04 NOTE — ED Notes (Signed)
Pt refused IV start/labs, consented to 12 lead EKG, sts will wait for MD to eval

## 2020-07-04 NOTE — ED Provider Notes (Signed)
[facility] Emergency Department Provider Note  ____________________________________________  Time seen: Approximately 3:46 AM  I have reviewed the triage vital signs and the nursing notes.   HISTORY  Chief Complaint Palpitations   HPI Kathleen Owen is a 30 y.o. female presents for evaluation of palpitations.  Patient reports that she has a tooth ache and she grabbed a bottle of Tylenol and took 1 pill from the bottle.  She later realized that the bottle did not contain Tylenol Inside.  She reports that she lives with another family and that the bottle belonged to a member of the other family.  She found out that the pill that she took was 100 mg Nucynta.  She reports an hour after taking the medicine that she started having palpitations felt that she had dry mouth, and reports that she was shaking.  She reports that she came in because she wanted to get her heart rate and blood pressure checked.  She reports that her symptoms have resolved at this time.  She denies feeling dizzy, sleepy, chest pain, shortness of breath.  She denies ever taking a symptom in the past.  Past Medical History:  Diagnosis Date  . 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 CD (Crohn's disease) ([place]) 07/30/2014  . Crohn's disease [facility]) Jan 2016  . Depression   . Miscarriage 07/18/2014    Patient Active Problem List   Diagnosis Date Noted  . PTSD (post-traumatic stress disorder) 10/11/2019  . Depression 10/11/2019  . Social anxiety disorder 10/11/2019  . Tobacco use disorder 10/11/2019  . Uterine fibroid 04/06/2019  . Supervision of high risk pregnancy, antepartum 11/24/2018  . UTI (urinary tract infection) during pregnancy 07/19/2018  . 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 Ileus ([place]) 12/01/2017  . Abdominal pain 12/01/2017  . C. difficile colitis 07/23/2017  . 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 Recurrent pregnancy loss with  current pregnancy 10/22/2016  . GAD (generalized anxiety disorder) 09/17/2015  . Major depressive disorder, single episode, moderate ([place]) 09/17/2015  . Smoker 11/22/2014  . Recurrent vaginitis 11/22/2014  . 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 CD (Crohn's disease) ([place]) 07/30/2014  . Body mass index (BMI) of 27.0-27.9 in adult 10/03/2013    Past Surgical History:  Procedure Laterality Date  . DILATION AND CURETTAGE OF UTERUS    . DILATION AND EVACUATION N/A 12/02/2016   Procedure: DILATATION AND EVACUATION;  Surgeon: Harlin Heys, MD;  Location: [facility] ORS;  Service: Gynecology;  Laterality: N/A;  . DILATION AND EVACUATION N/A 06/30/2017   Procedure: DILATATION AND EVACUATION;  Surgeon: Harlin Heys, MD;  Location: [facility] ORS;  Service: Gynecology;  Laterality: N/A;  . DILATION AND EVACUATION N/A 12/06/2018   Procedure: SUCTION D&C;  Surgeon: [name], MD;  Location: [facility] ORS;  Service: Gynecology;  Laterality: N/A;  . MYOMECTOMY N/A 04/06/2019   Procedure: MYOMECTOMY VIA LAPAROTOMY;  Surgeon: [name], MD;  Location: [facility] ORS;  Service: Gynecology;  Laterality: N/A;  . TONSILLECTOMY      Prior to Admission medications   Medication Sig Start Date End Date Taking? Authorizing Provider  acetaminophen (TYLENOL) 500 MG tablet Take by mouth. Patient not taking: Reported on 06/25/2020    [provider]  dicyclomine (BENTYL) 10 MG capsule Take 10 mg by mouth 4 (four) times daily. Patient not taking: Reported on 06/25/2020 07/27/19   [provider]  DULoxetine (CYMBALTA) 30 MG capsule Take 1 capsule (30 mg  total) by mouth daily. Patient not taking: Reported on 06/25/2020 10/11/19   Ursula Alert, MD  HYDROcodone-acetaminophen (NORCO/VICODIN) 5-325 MG tablet Take 1 tablet by mouth every 6 (six) hours as needed. Patient not taking: Reported on 06/25/2020 03/22/20   [name], MD  hydrOXYzine (ATARAX/VISTARIL) 25 MG tablet Take 1 tablet (25 mg total)  by mouth 3 (three) times daily as needed for anxiety (and sleep). Patient not taking: Reported on 06/25/2020 10/11/19   Ursula Alert, MD  Multiple Vitamins-Minerals (MULTIVITAMIN WITH MINERALS) tablet Take 1 tablet by mouth daily. 08/02/19   Jerene Dilling, PA  norethindrone (AYGESTIN) 5 MG tablet Take 1 tablet (5 mg total) by mouth daily. Patient not taking: Reported on 06/25/2020 11/13/19   [name], MD  polyethylene glycol powder [facility]) 17 GM/SCOOP powder Take by mouth as directed by your [facility] GI provider. Patient not taking: Reported on 06/25/2020 10/02/19   [provider]  simethicone (MYLICON) 259 MG chewable tablet Chew by mouth.  Patient not taking: Reported on 06/25/2020 10/02/19   [provider]  traZODone (DESYREL) 100 MG tablet Take 1-1.5 tablets (100-150 mg total) by mouth at bedtime as needed for sleep. Patient not taking: Reported on 06/25/2020 10/11/19   Ursula Alert, MD    Allergies Aspirin, Ibuprofen, and Amoxicillin  Family History  Problem Relation Age of Onset  . Healthy Mother   . Healthy Father   . Kidney disease Maternal Uncle   . Heart failure Maternal Uncle   . Heart failure Maternal Grandmother   . Hypertension Paternal Grandmother   . Cancer Paternal Grandmother   . Thyroid disease Paternal Aunt   . Cancer Paternal Grandfather   . Mental illness Neg Hx     Social History Social History   Tobacco Use  . Smoking status: Current Every Day Smoker    Types: Cigars  . Smokeless tobacco: Never Used  . Tobacco comment: black and mild 2-3 cigars a day  Vaping Use  . Vaping Use: Never used  Substance Use Topics  . Alcohol use: Yes    Alcohol/week: 2.0 standard drinks    Types: 2 Cans of beer per week    Comment: 2x/mo  . Drug use: Not Currently    Types: Marijuana    Comment: last use age 49    Review of Systems  Constitutional: Negative for fever. Eyes: Negative for visual changes. ENT: Negative for sore  throat. Neck: No neck pain  Cardiovascular: Negative for chest pain. + palpitations Respiratory: Negative for shortness of breath. Gastrointestinal: Negative for abdominal pain, vomiting or diarrhea. Genitourinary: Negative for dysuria. Musculoskeletal: Negative for back pain. Skin: Negative for rash. Neurological: Negative for headaches, weakness or numbness. Psych: No SI or HI  ____________________________________________   PHYSICAL EXAM:  VITAL SIGNS: ED Triage Vitals  Enc Vitals Group     BP 07/04/20 0326 116/82     Pulse Rate 07/04/20 0326 91     Resp 07/04/20 0326 18     Temp 07/04/20 0326 98.5 F (36.9 C)     Temp Source 07/04/20 0326 Oral     SpO2 07/04/20 0326 100 %     Weight 07/04/20 0325 140 lb (63.5 kg)     Height 07/04/20 0325 5' 3"  (1.6 m)     Head Circumference --      Peak Flow --      Pain Score 07/04/20 0325 0     Pain Loc --      Pain Edu? --  Excl. in [place]? --     Constitutional: Alert and oriented. Well appearing and in no apparent distress. HEENT:      Head: Normocephalic and atraumatic.         Eyes: Conjunctivae are normal. Sclera is non-icteric.       Mouth/Throat: Mucous membranes are moist.       Neck: Supple with no signs of meningismus. Cardiovascular: Regular rate and rhythm. No murmurs, gallops, or rubs. 2+ symmetrical distal pulses are present in all extremities.  Respiratory: Normal respiratory effort. Lungs are clear to auscultation bilaterally.  Gastrointestinal: Soft, non tender. Musculoskeletal:  No edema, cyanosis, or erythema of extremities. Neurologic: Normal speech and language. Face is symmetric. Moving all extremities. No gross focal neurologic deficits are appreciated. Skin: Skin is warm, dry and intact. No rash noted. Psychiatric: Mood and affect are normal. Speech and behavior are normal.  ____________________________________________   LABS (all labs ordered are listed, but only abnormal results are  displayed)  Labs Reviewed - No data to display ____________________________________________  EKG  ED ECG REPORT I, Rudene Re, the attending physician, personally viewed and interpreted this ECG.  Normal sinus rhythm, rate of 82, normal intervals, normal axis, no ST elevations or depressions.  Normal EKG. ____________________________________________  RADIOLOGY  none  ____________________________________________   PROCEDURES  Procedure(s) performed:yes .1-3 Lead EKG Interpretation Performed by: Rudene Re, MD Authorized by: Rudene Re, MD     Interpretation: normal     ECG rate assessment: normal     Rhythm: sinus rhythm     Ectopy: none     Conduction: normal     Critical Care performed:  None ____________________________________________   INITIAL IMPRESSION / ASSESSMENT AND PLAN / ED COURSE   30 y.o. female presents for evaluation of palpitations and dizziness that started an hour after she took 100 mg Nucynta accidentally thinking she was taking Tylenol for toothache.  Patient reports that the prescription belongs to another household member.  At this time she reports that her symptoms have resolved.  She denies palpitations, dizziness, sedation, chest pain, shortness of breath.  Her EKG shows no signs of ischemia or dysrhythmias.  She is connected to telemetry which shows normal vital signs, normal regular heart rate.  Patient has no signs of sedation or pinpoint pupils.  Recommended monitoring her on telemetry for an hour and checking some blood work but patient declined.  She reports that she just wanted to have her heart rate and blood pressure checked.  She reports "I would hate to take a bed from someone who needs it."  I did explain to the patient that there is no one in the waiting room and that we are happy to take care of her and monitor her for any side effects.  She slightly declined again and thanked me for getting an EKG and reassuring her  that her vital signs at this time are normal.  We did discuss side effects of this medication and recommended return to the emergency room if these develop.  Patient be discharged to the care of her husband was at bedside.      _____________________________________________ Please note:  Patient was evaluated in Emergency Department today for the symptoms described in the history of present illness. Patient was evaluated in the context of the global COVID-19 pandemic, which necessitated consideration that the patient might be at risk for infection with the SARS-CoV-2 virus that causes COVID-19. Institutional protocols and algorithms that pertain to the evaluation of patients at risk  for COVID-19 are in a state of rapid change based on information released by regulatory bodies including the CDC and federal and state organizations. These policies and algorithms were followed during the patient's care in the ED.  Some ED evaluations and interventions may be delayed as a result of limited staffing during the pandemic.   [place] Controlled Substance Database was reviewed by me. ____________________________________________   FINAL CLINICAL IMPRESSION(S) / ED DIAGNOSES   Final diagnoses:  Medication side effect      NEW MEDICATIONS STARTED DURING THIS VISIT:  ED Discharge Orders    None       Note:  This document was prepared using Dragon voice recognition software and may include unintentional dictation errors.    Alfred Levins, [place], MD 07/04/20 [phone]

## 2020-08-20 NOTE — Telephone Encounter (Signed)
Pt saw cnm at HD.

## 2020-09-10 ENCOUNTER — Emergency Department
Admission: EM | Admit: 2020-09-10 | Discharge: 2020-09-11 | Disposition: A | Discharge status: Home / Self Care | Payer: Medicaid Other | Attending: Emergency Medicine | Admitting: Emergency Medicine

## 2020-09-10 ENCOUNTER — Ambulatory Visit: Payer: Self-pay | Admitting: *Deleted

## 2020-09-10 ENCOUNTER — Other Ambulatory Visit: Payer: Self-pay

## 2020-09-10 ENCOUNTER — Encounter: Payer: Self-pay | Admitting: *Deleted

## 2020-09-10 DIAGNOSIS — R52 Pain, unspecified: Secondary | ICD-10-CM

## 2020-09-10 DIAGNOSIS — R102 Pelvic and perineal pain: Secondary | ICD-10-CM | POA: Diagnosis not present

## 2020-09-10 DIAGNOSIS — R197 Diarrhea, unspecified: Secondary | ICD-10-CM | POA: Insufficient documentation

## 2020-09-10 DIAGNOSIS — D72829 Elevated white blood cell count, unspecified: Secondary | ICD-10-CM | POA: Diagnosis not present

## 2020-09-10 DIAGNOSIS — Z20822 Contact with and (suspected) exposure to covid-19: Secondary | ICD-10-CM | POA: Insufficient documentation

## 2020-09-10 DIAGNOSIS — R1030 Lower abdominal pain, unspecified: Secondary | ICD-10-CM | POA: Diagnosis present

## 2020-09-10 DIAGNOSIS — F17290 Nicotine dependence, other tobacco product, uncomplicated: Secondary | ICD-10-CM | POA: Insufficient documentation

## 2020-09-10 DIAGNOSIS — Z8742 Personal history of other diseases of the female genital tract: Secondary | ICD-10-CM | POA: Diagnosis not present

## 2020-09-10 DIAGNOSIS — N898 Other specified noninflammatory disorders of vagina: Secondary | ICD-10-CM | POA: Diagnosis not present

## 2020-09-10 DIAGNOSIS — Z8719 Personal history of other diseases of the digestive system: Secondary | ICD-10-CM | POA: Diagnosis not present

## 2020-09-10 LAB — COMPREHENSIVE METABOLIC PANEL
ALT: 14 U/L (ref 0–44)
AST: 16 U/L (ref 15–41)
Albumin: 4.2 g/dL (ref 3.5–5.0)
Alkaline Phosphatase: 38 U/L (ref 38–126)
Anion gap: 4 — ABNORMAL LOW (ref 5–15)
BUN: 9 mg/dL (ref 6–20)
CO2: 26 mmol/L (ref 22–32)
Calcium: 8.7 mg/dL — ABNORMAL LOW (ref 8.9–10.3)
Chloride: 107 mmol/L (ref 98–111)
Creatinine, Ser: 0.56 mg/dL (ref 0.44–1.00)
GFR, Estimated: >60 mL/min (ref >60)
Glucose, Bld: 86 mg/dL (ref 70–99)
Potassium: 3.7 mmol/L (ref 3.5–5.1)
Sodium: 137 mmol/L (ref 135–145)
Total Bilirubin: 0.7 mg/dL (ref 0.3–1.2)
Total Protein: 7.1 g/dL (ref 6.5–8.1)

## 2020-09-10 LAB — CBC
HCT: 40.5 % (ref 36.0–46.0)
Hemoglobin: 14.1 g/dL (ref 12.0–15.0)
MCH: 32.7 pg (ref 26.0–34.0)
MCHC: 34.8 g/dL (ref 30.0–36.0)
MCV: 94 fL (ref 80.0–100.0)
Platelets: 217 10*3/uL (ref 150–400)
RBC: 4.31 MIL/uL (ref 3.87–5.11)
RDW: 13.3 % (ref 11.5–15.5)
WBC: 15.2 10*3/uL — ABNORMAL HIGH (ref 4.0–10.5)
nRBC: 0 % (ref 0.0–0.2)

## 2020-09-10 LAB — LIPASE, BLOOD: Lipase: 45 U/L (ref 11–51)

## 2020-09-10 NOTE — Telephone Encounter (Signed)
Reason for Disposition . Pain with sexual intercourse (dyspareunia)  Answer Assessment - Initial Assessment Questions 1. LOCATION: "Where does it hurt?"      Allover- lower quadrant 2. RADIATION: "Does the pain shoot anywhere else?" (e.g., chest, back)     To back- R side mostly 3. ONSET: "When did the pain begin?" (e.g., minutes, hours or days ago)      Puberty- gets worse- inflamation 4. SUDDEN: "Gradual or sudden onset?"     gradual 5. PATTERN "Does the pain come and go, or is it constant?"    - If constant: "Is it getting better, staying the same, or worsening?"      (Note: Constant means the pain never goes away completely; most serious pain is constant and it progresses)     - If intermittent: "How long does it last?" "Do you have pain now?"     (Note: Intermittent means the pain goes away completely between bouts)     Can last 1 week- usually with cycle 6. SEVERITY: "How bad is the pain?"  (e.g., Scale 1-10; mild, moderate, or severe)   - MILD (1-3): doesn't interfere with normal activities, abdomen soft and not tender to touch    - MODERATE (4-7): interferes with normal activities or awakens from sleep, abdomen tender to touch    - SEVERE (8-10): excruciating pain, doubled over, unable to do any normal activities      severe 7. RECURRENT SYMPTOM: "Have you ever had this type of stomach pain before?" If Yes, ask: "When was the last time?" and "What happened that time?"      Yes- monthly 8. CAUSE: "What do you think is causing the stomach pain?"     GYN cause-referred to  fertility- January 9. RELIEVING/AGGRAVATING FACTORS: "What makes it better or worse?" (e.g., movement, antacids, bowel movement)     No- uses heating pad 10. OTHER SYMPTOMS: "Do you have any other symptoms?" (e.g., back pain, diarrhea, fever, urination pain, vomiting)       Diarrhea- during cycle time 11. PREGNANCY: "Is there any chance you are pregnant?" "When was your last menstrual period?"        LMP-6/19  Protocols used: Abdominal Pain - [facility]

## 2020-09-10 NOTE — ED Triage Notes (Signed)
Pt reports low abd pain with diarrhea   no vomiting.  Pt states blood in stool   pt alert  speech clear.

## 2020-09-10 NOTE — ED Notes (Signed)
Pt unable to void at this time. 

## 2020-09-10 NOTE — Telephone Encounter (Signed)
Patient is calling to report she is having pain/loose stool every month with her cycle- patient states this has been life long and is getting worse. Patient states she was cleared fr GI cause- but feels she has not been cleared for GYN cause. Patient does have GYN- but feels she is not getting the help she needs. Advised patient to seek care with another GYN if she does not feel she is being heard. Patient advised ED today

## 2020-09-11 ENCOUNTER — Emergency Department: Payer: Medicaid Other

## 2020-09-11 LAB — URINALYSIS, COMPLETE (UACMP) WITH MICROSCOPIC
Bacteria, UA: NONE SEEN
Bilirubin Urine: NEGATIVE
Glucose, UA: NEGATIVE mg/dL
Hgb urine dipstick: NEGATIVE
Ketones, ur: NEGATIVE mg/dL
Leukocytes,Ua: NEGATIVE
Nitrite: NEGATIVE
Protein, ur: NEGATIVE mg/dL
Specific Gravity, Urine: 1.029 (ref 1.005–1.030)
WBC, UA: NONE SEEN WBC/hpf (ref 0–5)
pH: 5 (ref 5.0–8.0)

## 2020-09-11 LAB — POC URINE PREG, ED: Preg Test, Ur: NEGATIVE

## 2020-09-11 LAB — WET PREP, GENITAL
Clue Cells Wet Prep HPF POC: NONE SEEN
Sperm: NONE SEEN
Trich, Wet Prep: NONE SEEN
WBC, Wet Prep HPF POC: NONE SEEN
Yeast Wet Prep HPF POC: NONE SEEN

## 2020-09-11 LAB — RESP PANEL BY RT-PCR (FLU A&B, COVID) ARPGX2
Influenza A by PCR: NEGATIVE
Influenza B by PCR: NEGATIVE
SARS Coronavirus 2 by RT PCR: NEGATIVE

## 2020-09-11 LAB — CHLAMYDIA/NGC RT PCR (ARMC ONLY)
Chlamydia Tr: NOT DETECTED
N gonorrhoeae: NOT DETECTED

## 2020-09-11 MED ORDER — ONDANSETRON HCL 4 MG/2ML IJ SOLN
4.0000 mg | Freq: Once | INTRAMUSCULAR | Status: AC
  Administered 2020-09-11: 4 mg via INTRAVENOUS
  Filled 2020-09-11: qty 2

## 2020-09-11 MED ORDER — ONDANSETRON 4 MG PO TBDP: 4.0000 mg | ORAL_TABLET | Freq: Three times a day (TID) | ORAL | 0 refills | Status: DC | PRN

## 2020-09-11 MED ORDER — HYDROMORPHONE HCL 1 MG/ML IJ SOLN
0.5000 mg | Freq: Once | INTRAMUSCULAR | Status: AC
Start: 2020-09-11 — End: 2020-09-11
  Administered 2020-09-11: 0.5 mg via INTRAVENOUS
  Filled 2020-09-11: qty 1

## 2020-09-11 MED ORDER — DICYCLOMINE HCL 20 MG PO TABS
20.0000 mg | ORAL_TABLET | Freq: Once | ORAL | Status: AC
  Administered 2020-09-11: 20 mg via ORAL
  Filled 2020-09-11: qty 1

## 2020-09-11 MED ORDER — IOHEXOL 300 MG/ML  SOLN
75.0000 mL | Freq: Once | INTRAMUSCULAR | Status: AC | PRN
  Administered 2020-09-11: 75 mL via INTRAVENOUS
  Filled 2020-09-11: qty 75

## 2020-09-11 MED ORDER — MORPHINE SULFATE (PF) 4 MG/ML IV SOLN
4.0000 mg | Freq: Once | INTRAVENOUS | Status: AC
Start: 2020-09-11 — End: 2020-09-11
  Administered 2020-09-11: 4 mg via INTRAVENOUS
  Filled 2020-09-11: qty 1

## 2020-09-11 MED ORDER — DICYCLOMINE HCL 20 MG PO TABS: 20.0000 mg | ORAL_TABLET | Freq: Four times a day (QID) | ORAL | 0 refills | Status: DC

## 2020-09-11 MED ORDER — SODIUM CHLORIDE 0.9 % IV BOLUS
1000.0000 mL | Freq: Once | INTRAVENOUS | Status: AC
  Administered 2020-09-11: 1000 mL via INTRAVENOUS

## 2020-09-11 NOTE — ED Notes (Signed)
MD and writer at bedside for discharge instructions as well as follow up care. All questions answered and instructions for sample return provided.

## 2020-09-11 NOTE — ED Notes (Signed)
Pt reports N/V and abd pain, MD notified and aware

## 2020-09-11 NOTE — ED Notes (Signed)
Pt completed oral contrast, CT technologist notifed

## 2020-09-11 NOTE — ED Provider Notes (Signed)
[facility] Emergency Department Provider Note   ____________________________________________   Event Date/Time   First MD Initiated Contact with Patient 09/10/20 2359     (approximate)  I have reviewed the triage vital signs and the nursing notes.   HISTORY  Chief Complaint Abdominal Pain    HPI Kathleen Owen is a 30 y.o. female who presents to the ED from home with a chief complaint of low abdominal/pelvic pain with diarrhea.  Symptoms x2 to 3 days.  States she "feels like it is in my vagina".  History of PID.  Does endorse vaginal discharge but states she has not had sexual intercourse in over 2 weeks.  Also complaining of 6-7 loose BMs daily.  History of Crohn's disease although she states she was taken off medications.  Denies fever, cough, chest pain, shortness of breath, nausea, vomiting or dysuria.     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/30/2014   CD (Crohn's disease) ([place]) 07/30/2014    Body mass index (BMI) of 27.0-27.9 in adult 10/03/2013    Past Surgical History:  Procedure Laterality Date   DILATION AND CURETTAGE OF UTERUS     DILATION AND EVACUATION N/A 12/02/2016   Procedure: DILATATION AND EVACUATION;  Surgeon: Harlin Heys, MD;  Location: [facility] ORS;  Service: Gynecology;  Laterality: N/A;   DILATION AND EVACUATION N/A 06/30/2017   Procedure: DILATATION AND EVACUATION;  Surgeon: Harlin Heys, MD;  Location: [facility] ORS;  Service: Gynecology;  Laterality: N/A;   DILATION AND EVACUATION N/A 12/06/2018   Procedure: SUCTION D&C;  Surgeon: [name], MD;  Location: [facility] ORS;  Service: Gynecology;  Laterality: N/A;   MYOMECTOMY N/A 04/06/2019   Procedure: MYOMECTOMY VIA LAPAROTOMY;  Surgeon: [name], MD;  Location: [facility] ORS;  Service: Gynecology;  Laterality: N/A;   TONSILLECTOMY      Prior to Admission medications   Medication Sig Start Date End Date Taking? Authorizing Provider  dicyclomine (BENTYL) 20 MG tablet Take 1 tablet (20 mg total) by mouth every 6 (six) hours. 09/11/20  Yes Paulette Blanch, MD  ondansetron (ZOFRAN ODT) 4 MG disintegrating tablet Take 1 tablet (4 mg total) by mouth every 8 (eight) hours as needed for nausea or vomiting. 09/11/20  Yes Paulette Blanch, MD  acetaminophen (TYLENOL) 500 MG tablet Take by mouth. Patient not taking: Reported on 06/25/2020    [provider]  DULoxetine (CYMBALTA) 30 MG capsule Take 1 capsule (30 mg total) by mouth daily. Patient not taking: Reported on 06/25/2020 10/11/19   Eappen,  Saramma, MD  HYDROcodone-acetaminophen (NORCO/VICODIN) 5-325 MG tablet Take 1 tablet by mouth every 6 (six) hours as needed. Patient not taking: Reported on 06/25/2020 03/22/20   [name], MD  hydrOXYzine (ATARAX/VISTARIL) 25 MG tablet Take 1 tablet (25 mg total) by mouth 3 (three) times daily as needed for anxiety (and sleep). Patient not taking: Reported on 06/25/2020 10/11/19   Ursula Alert, MD  Multiple  Vitamins-Minerals (MULTIVITAMIN WITH MINERALS) tablet Take 1 tablet by mouth daily. 08/02/19   Jerene Dilling, PA  norethindrone (AYGESTIN) 5 MG tablet Take 1 tablet (5 mg total) by mouth daily. Patient not taking: Reported on 06/25/2020 11/13/19   [name], MD  polyethylene glycol powder [facility]) 17 GM/SCOOP powder Take by mouth as directed by your [facility] GI provider. Patient not taking: Reported on 06/25/2020 10/02/19   [provider]  simethicone (MYLICON) 830 MG chewable tablet Chew by mouth.  Patient not taking: Reported on 06/25/2020 10/02/19   [provider]  traZODone (DESYREL) 100 MG tablet Take 1-1.5 tablets (100-150 mg total) by mouth at bedtime as needed for sleep. Patient not taking: Reported on 06/25/2020 10/11/19   Ursula Alert, MD    Allergies Aspirin, Ibuprofen, and Amoxicillin  Family History  Problem Relation Age of Onset   Healthy Mother    Healthy Father    Kidney disease Maternal Uncle    Heart failure Maternal Uncle    Heart failure Maternal Grandmother    Hypertension Paternal Grandmother    Cancer Paternal Grandmother    Thyroid disease Paternal Aunt    Cancer Paternal Grandfather    Mental illness Neg Hx     Social History Social History   Tobacco Use   Smoking status: Every Day    Types: Cigars   Smokeless tobacco: Never   Tobacco comments:    black and mild 2-3 cigars a day  Vaping Use   Vaping Use: Never used  Substance Use Topics   Alcohol use: Yes    Alcohol/week: 2.0 standard drinks    Types: 2 Cans of beer per week    Comment: 2x/mo   Drug use: Not Currently    Types: Marijuana    Comment: last use age 51    Review of Systems  Constitutional: No fever/chills Eyes: No visual changes. ENT: No sore throat. Cardiovascular: Denies chest pain. Respiratory: Denies shortness of breath. Gastrointestinal: Positive for abdominal/pelvic pain.  No nausea, no vomiting.  Positive for diarrhea.  No  constipation. Genitourinary: Negative for dysuria. Musculoskeletal: Negative for back pain. Skin: Negative for rash. Neurological: Negative for headaches, focal weakness or numbness.   ____________________________________________   PHYSICAL EXAM:  VITAL SIGNS: ED Triage Vitals [09/10/20 2113]  Enc Vitals Group     BP (!) 101/58     Pulse Rate 81     Resp 20     Temp 98.7 F (37.1 C)     Temp Source Oral     SpO2 99 %     Weight 140 lb (63.5 kg)     Height _0  (1.6 m)     Head Circumference      Peak Flow      Pain Score 8     Pain Loc      Pain Edu?      Excl. in [place]?     Constitutional: Alert and oriented. Well appearing and in mild acute distress. Eyes: Conjunctivae are normal. PERRL. EOMI. Head: Atraumatic. Nose: No congestion/rhinnorhea. Mouth/Throat: Mucous membranes are moist.  Neck: No stridor.   Cardiovascular: Normal rate, regular rhythm. Grossly normal heart sounds.  Good peripheral circulation. Respiratory: Normal respiratory effort.  No retractions. Lungs CTAB. Gastrointestinal: Soft and mildly tender to palpation pelvis without rebound or guarding. No distention. No abdominal bruits. No CVA tenderness. Musculoskeletal: No lower extremity tenderness nor edema.  No joint effusions. Neurologic:  Normal speech and language. No gross focal neurologic deficits are appreciated. No gait instability. Skin:  Skin is warm, dry and intact. No rash noted. Psychiatric: Mood and affect are normal. Speech and behavior are normal.  ____________________________________________   LABS (all labs ordered are listed, but only abnormal results are displayed)  Labs Reviewed  COMPREHENSIVE METABOLIC PANEL - Abnormal; Notable for the following components:      Result Value   Calcium 8.7 (*)    Anion gap 4 (*)    All other components within normal limits  CBC - Abnormal; Notable for the following components:   WBC 15.2 (*)    All other components within normal limits   URINALYSIS, COMPLETE (UACMP) WITH MICROSCOPIC - Abnormal; Notable for the following components:   Color, Urine YELLOW (*)    APPearance HAZY (*)    All other components within normal limits  POC URINE PREG, ED - Normal  RESP PANEL BY RT-PCR (FLU A&B, COVID) ARPGX2  CHLAMYDIA/NGC RT PCR ([facility] ONLY)            WET PREP, GENITAL  C DIFFICILE QUICK SCREEN W PCR REFLEX    GASTROINTESTINAL PANEL BY PCR, STOOL (REPLACES STOOL CULTURE)  LIPASE, BLOOD  POC URINE PREG, ED   ____________________________________________  EKG  None ____________________________________________  RADIOLOGY I, Beatric Fulop J, personally viewed and evaluated these images (plain radiographs) as part of my medical decision making, as well as reviewing the written report by the radiologist.  ED MD interpretation: Unremarkable pelvic ultrasound and CT scan  Official radiology report(s): CT Abdomen Pelvis W Contrast  Result Date: 09/11/2020 CLINICAL DATA:  Acute nonlocalized abdominal pain. Lower abdominal pain with diarrhea EXAM: CT ABDOMEN AND PELVIS WITH CONTRAST TECHNIQUE: Multidetector CT imaging of the abdomen and pelvis was performed using the standard protocol following bolus administration of intravenous contrast. CONTRAST:  8m OMNIPAQUE IOHEXOL 300 MG/ML  SOLN COMPARISON:  12/26/2018 FINDINGS: Lower chest:  No contributory findings. Hepatobiliary: No focal liver abnormality.No evidence of biliary obstruction or stone. Pancreas: Unremarkable. Spleen: Unremarkable. Adrenals/Urinary Tract: Negative adrenals. No hydronephrosis or stone. Unremarkable bladder. Stomach/Bowel:  No obstruction. No appendicitis. Vascular/Lymphatic: No acute vascular abnormality. No mass or adenopathy. Reproductive:No pathologic findings. Other: No ascites or pneumoperitoneum. Musculoskeletal: No acute abnormalities. IMPRESSION: Negative abdominal CT. Electronically Signed   By: JMonte FantasiaM.D.   On: 09/11/2020 04:06   [place] COMPLETE  W TRANSVAGINAL AND TORSION R/O  Result Date: 09/11/2020 CLINICAL DATA:  Pelvic pain, prior myomectomy, LMP 08/11/2020 EXAM: TRANSABDOMINAL AND TRANSVAGINAL ULTRASOUND OF PELVIS DOPPLER ULTRASOUND OF OVARIES TECHNIQUE: Both transabdominal and transvaginal ultrasound examinations of the pelvis were performed. Transabdominal technique was performed for global imaging of the pelvis including uterus, ovaries, adnexal regions, and pelvic cul-de-sac. It was necessary to proceed with endovaginal exam following the transabdominal exam to visualize the endometrium and ovaries bilaterally. Color and duplex Doppler ultrasound was utilized to evaluate blood flow to the ovaries. COMPARISON:  None. FINDINGS: Uterus Measurements: 6.8 x 4.0 x 4.4 cm = volume: 63 mL. No fibroids or other mass visualized. The cervix is unremarkable. Endometrium Thickness: 6 mm.  No focal abnormality visualized. Right ovary  Measurements: 3.0 x 0.8 x 2.7 cm = volume: 3 mL. Normal appearance/no adnexal mass. Left ovary Measurements: 3.6 x 1.7 x 2.0 cm = volume: 7 mL. Normal appearance/no adnexal mass. Pulsed Doppler evaluation of both ovaries demonstrates normal low-resistance arterial and venous waveforms. Other findings No abnormal free fluid. IMPRESSION: Normal pelvic sonogram. Electronically Signed   By: Fidela Salisbury MD   On: 09/11/2020 02:04    ____________________________________________   PROCEDURES  Procedure(s) performed (including Critical Care):  Procedures  Pelvic exam: External exam unremarkable without rashes, lesions or vesicles.  Speculum exam reveals nonmalodorous white discharge, no vaginal bleeding, cervical os is closed.  Bimanual exam reveals mild bilateral adnexal tenderness.  ____________________________________________   INITIAL IMPRESSION / ASSESSMENT AND PLAN / ED COURSE  As part of my medical decision making, I reviewed the following data within the [place] notes reviewed and  incorporated, Labs reviewed, Old chart reviewed, Radiograph reviewed, Notes from prior ED visits, and [place] Controlled Substance Database     30 year old female presenting with pelvic pain and diarrhea. Differential diagnosis includes, but is not limited to, ovarian cyst, ovarian torsion, acute appendicitis, diverticulitis, urinary tract infection/pyelonephritis, endometriosis, bowel obstruction, colitis, renal colic, gastroenteritis, hernia, fibroids, endometriosis, pregnancy related pain including ectopic pregnancy, etc.   Laboratory results demonstrate moderate leukocytosis.  Will start with pelvic ultrasound for imaging, collect stool specimens if patient is able to produce.  If ultrasound is unremarkable, consider CT abdomen/pelvis.  We will also obtain COVID-19 swab.  Clinical Course as of 09/11/20 0608  Wed Sep 11, 2020  0131 Urine POCT is negative. [JS]  0459 Updated patient on negative pelvic ultrasound.  Awaiting STD panel.  Given her persistent pain, will proceed to CT abdomen/pelvis. [JS]  0422 Updated patient on unremarkable CT scan.  Generally patient has had reassuring work-up in the ED.  Has not stooled.  Will discharge home with stool collection kit, prescriptions for Bentyl and Zofran to use as needed.  Patient has an appointment with her PCP today.  Strict return precautions given.  Patient verbalizes understanding agrees with plan of care. [JS]    Clinical Course User Index [JS] Paulette Blanch, MD     ____________________________________________   FINAL CLINICAL IMPRESSION(S) / ED DIAGNOSES  Final diagnoses:  Pain  Lower abdominal pain  Diarrhea, unspecified type     ED Discharge Orders          Ordered    ondansetron (ZOFRAN ODT) 4 MG disintegrating tablet  Every 8 hours PRN        09/11/20 0424    dicyclomine (BENTYL) 20 MG tablet  Every 6 hours        09/11/20 0424             Note:  This document was prepared using Dragon voice recognition software and  may include unintentional dictation errors.    Paulette Blanch, MD 09/11/20 [phone]

## 2020-09-11 NOTE — ED Notes (Signed)
Pt transported to CT via wheelchair by CT technologist

## 2020-09-11 NOTE — ED Notes (Signed)
Patient return from CT

## 2020-09-11 NOTE — Discharge Instructions (Signed)
1.  You may take medicines as needed for abdominal discomfort and nausea (Bentyl/Zofran #20). 2.  BRAT diet x5 days, then slowly advance diet as tolerated. 3.  You may bring your stool specimen to outpatient laboratory at your convenience. 4.  Return to the ER for worsening symptoms, persistent vomiting, difficulty breathing or other concerns.

## 2020-09-21 ENCOUNTER — Emergency Department
Admission: EM | Admit: 2020-09-21 | Discharge: 2020-09-21 | Disposition: A | Discharge status: Home / Self Care | Payer: Medicaid Other | Attending: Emergency Medicine | Admitting: Emergency Medicine

## 2020-09-21 ENCOUNTER — Other Ambulatory Visit: Payer: Self-pay

## 2020-09-21 ENCOUNTER — Emergency Department: Payer: Medicaid Other

## 2020-09-21 DIAGNOSIS — Z28310 Unvaccinated for covid-19: Secondary | ICD-10-CM | POA: Insufficient documentation

## 2020-09-21 DIAGNOSIS — U071 COVID-19: Secondary | ICD-10-CM | POA: Diagnosis not present

## 2020-09-21 DIAGNOSIS — F17290 Nicotine dependence, other tobacco product, uncomplicated: Secondary | ICD-10-CM | POA: Insufficient documentation

## 2020-09-21 DIAGNOSIS — R0602 Shortness of breath: Secondary | ICD-10-CM | POA: Diagnosis present

## 2020-09-21 LAB — CBC WITH DIFFERENTIAL/PLATELET
Abs Immature Granulocytes: 0.03 10*3/uL (ref 0.00–0.07)
Basophils Absolute: 0.1 10*3/uL (ref 0.0–0.1)
Basophils Relative: 1 %
Eosinophils Absolute: 0.1 10*3/uL (ref 0.0–0.5)
Eosinophils Relative: 2 %
HCT: 37.8 % (ref 36.0–46.0)
Hemoglobin: 13.5 g/dL (ref 12.0–15.0)
Immature Granulocytes: 0 %
Lymphocytes Relative: 6 %
Lymphs Abs: 0.4 10*3/uL — ABNORMAL LOW (ref 0.7–4.0)
MCH: 32.8 pg (ref 26.0–34.0)
MCHC: 35.7 g/dL (ref 30.0–36.0)
MCV: 92 fL (ref 80.0–100.0)
Monocytes Absolute: 1.1 10*3/uL — ABNORMAL HIGH (ref 0.1–1.0)
Monocytes Relative: 14 %
Neutro Abs: 5.8 10*3/uL (ref 1.7–7.7)
Neutrophils Relative %: 77 %
Platelets: 159 10*3/uL (ref 150–400)
RBC: 4.11 MIL/uL (ref 3.87–5.11)
RDW: 13.4 % (ref 11.5–15.5)
WBC: 7.5 10*3/uL (ref 4.0–10.5)
nRBC: 0 % (ref 0.0–0.2)

## 2020-09-21 LAB — RESP PANEL BY RT-PCR (FLU A&B, COVID) ARPGX2
Influenza A by PCR: NEGATIVE
Influenza B by PCR: NEGATIVE
SARS Coronavirus 2 by RT PCR: POSITIVE — AB

## 2020-09-21 LAB — TROPONIN I (HIGH SENSITIVITY): Troponin I (High Sensitivity): <2 ng/L (ref <18)

## 2020-09-21 LAB — COMPREHENSIVE METABOLIC PANEL
ALT: 16 U/L (ref 0–44)
AST: 16 U/L (ref 15–41)
Albumin: 4.2 g/dL (ref 3.5–5.0)
Alkaline Phosphatase: 35 U/L — ABNORMAL LOW (ref 38–126)
Anion gap: 6 (ref 5–15)
BUN: 8 mg/dL (ref 6–20)
CO2: 26 mmol/L (ref 22–32)
Calcium: 8.9 mg/dL (ref 8.9–10.3)
Chloride: 103 mmol/L (ref 98–111)
Creatinine, Ser: 0.59 mg/dL (ref 0.44–1.00)
GFR, Estimated: >60 mL/min (ref >60)
Glucose, Bld: 110 mg/dL — ABNORMAL HIGH (ref 70–99)
Potassium: 3.8 mmol/L (ref 3.5–5.1)
Sodium: 135 mmol/L (ref 135–145)
Total Bilirubin: 0.6 mg/dL (ref 0.3–1.2)
Total Protein: 6.9 g/dL (ref 6.5–8.1)

## 2020-09-21 MED ORDER — ONDANSETRON 4 MG PO TBDP
4.0000 mg | ORAL_TABLET | Freq: Once | ORAL | Status: AC
  Administered 2020-09-21: 4 mg via ORAL
  Filled 2020-09-21: qty 1

## 2020-09-21 NOTE — ED Provider Notes (Signed)
[facility] Emergency Department Provider Note  ____________________________________________   Event Date/Time   First MD Initiated Contact with Patient 09/21/20 1143     (approximate)  I have reviewed the triage vital signs and the nursing notes.   HISTORY  Chief Complaint Shortness of Breath   HPI Kathleen Owen is a 30 y.o. female presents to the ED with complaint of chest pain, shortness of breath, bilateral leg pain and headache.  Patient also reports fever for the last 6 days and nausea without vomiting.  Patient reports that she took 2 home COVID test which were negative.  Patient has been taking Tylenol for her fever.  She denies any diarrhea or change in taste or smell.  She reports that no other family members are sick at this time.  Patient did not get the COVID-vaccine.  Currently she reports her pain as a 10/10.         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   Bacterial vaginosis August 2016   recurrent BV   CD (Crohn's disease) ([place]) 07/30/2014   Crohn's disease ([place]) Jan 2016   Depression    Miscarriage 07/18/2014    Patient Active Problem List   Diagnosis Date Noted   PTSD (post-traumatic stress disorder) 10/11/2019   Depression 10/11/2019   Social anxiety disorder 10/11/2019   Tobacco use disorder 10/11/2019   Uterine fibroid 04/06/2019   Supervision of high risk pregnancy, antepartum 11/24/2018   UTI (urinary tract infection) during pregnancy 07/19/2018   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   CD (Crohn's  disease) ([place]) 07/30/2014   Body mass index (BMI) of 27.0-27.9 in adult 10/03/2013    Past Surgical History:  Procedure Laterality Date   DILATION AND CURETTAGE OF UTERUS     DILATION AND EVACUATION N/A 12/02/2016   Procedure: DILATATION AND EVACUATION;  Surgeon: Harlin Heys, MD;  Location: [facility] ORS;  Service: Gynecology;  Laterality: N/A;   DILATION AND EVACUATION N/A 06/30/2017   Procedure: DILATATION AND EVACUATION;  Surgeon: Harlin Heys, MD;  Location: [facility] ORS;  Service: Gynecology;  Laterality: N/A;   DILATION AND EVACUATION N/A 12/06/2018   Procedure: SUCTION D&C;  Surgeon: [name], MD;  Location: [facility] ORS;  Service: Gynecology;  Laterality: N/A;   MYOMECTOMY N/A 04/06/2019   Procedure: MYOMECTOMY VIA LAPAROTOMY;  Surgeon: [name], MD;  Location: [facility] ORS;  Service: Gynecology;  Laterality: N/A;   TONSILLECTOMY      Prior to Admission medications   Medication Sig Start Date End Date Taking? Authorizing Provider  acetaminophen (TYLENOL) 500 MG tablet Take by mouth. Patient not taking: Reported on 06/25/2020    [provider]  dicyclomine (BENTYL) 20 MG tablet Take 1 tablet (20 mg total) by mouth every 6 (six) hours. 09/11/20   Paulette Blanch, MD  DULoxetine (CYMBALTA) 30 MG capsule Take 1 capsule (30 mg total) by mouth daily. Patient not taking: Reported on 06/25/2020 10/11/19   Ursula Alert, MD  HYDROcodone-acetaminophen (NORCO/VICODIN) 5-325 MG tablet Take 1 tablet by mouth every 6 (six) hours as needed. Patient not taking: Reported on 06/25/2020  03/22/20   [name], MD  hydrOXYzine (ATARAX/VISTARIL) 25 MG tablet Take 1 tablet (25 mg total) by mouth 3 (three) times daily as needed for anxiety (and sleep). Patient not taking: Reported on 06/25/2020 10/11/19   Ursula Alert, MD  Multiple Vitamins-Minerals (MULTIVITAMIN WITH MINERALS) tablet Take 1 tablet by mouth daily. 08/02/19   Jerene Dilling, PA  norethindrone (AYGESTIN) 5 MG tablet  Take 1 tablet (5 mg total) by mouth daily. Patient not taking: Reported on 06/25/2020 11/13/19   [name], MD  ondansetron (ZOFRAN ODT) 4 MG disintegrating tablet Take 1 tablet (4 mg total) by mouth every 8 (eight) hours as needed for nausea or vomiting. 09/11/20   Paulette Blanch, MD  polyethylene glycol powder (GLYCOLAX/MIRALAX) 17 GM/SCOOP powder Take by mouth as directed by your [facility] GI provider. Patient not taking: Reported on 06/25/2020 10/02/19   [provider]  simethicone (MYLICON) 196 MG chewable tablet Chew by mouth.  Patient not taking: Reported on 06/25/2020 10/02/19   [provider]  traZODone (DESYREL) 100 MG tablet Take 1-1.5 tablets (100-150 mg total) by mouth at bedtime as needed for sleep. Patient not taking: Reported on 06/25/2020 10/11/19   Ursula Alert, MD    Allergies Aspirin, Ibuprofen, and Amoxicillin  Family History  Problem Relation Age of Onset   Healthy Mother    Healthy Father    Kidney disease Maternal Uncle    Heart failure Maternal Uncle    Heart failure Maternal Grandmother    Hypertension Paternal Grandmother    Cancer Paternal Grandmother    Thyroid disease Paternal Aunt    Cancer Paternal Grandfather    Mental illness Neg Hx     Social History Social History   Tobacco Use   Smoking status: Every Day    Types: Cigars   Smokeless tobacco: Never   Tobacco comments:    black and mild 2-3 cigars a day  Vaping Use   Vaping Use: Never used  Substance Use Topics   Alcohol use: Yes    Alcohol/week: 2.0 standard drinks    Types: 2 Cans of beer per week    Comment: 2x/mo   Drug use: Not Currently    Types: Marijuana    Comment: last use age 21    Review of Systems Constitutional: Positive fever/chills Eyes: No visual changes. ENT: No sore throat. Cardiovascular: Denies chest pain. Respiratory: Positive for shortness of breath. Gastrointestinal: No abdominal pain.  Positive nausea, no vomiting.  No diarrhea.  No  constipation. Genitourinary: Negative for dysuria. Musculoskeletal: Positive musculoskeletal pain especially in the lower extremities. Skin: Negative for rash. Neurological: Negative for headaches, focal weakness or numbness.   ____________________________________________   PHYSICAL EXAM:  VITAL SIGNS: ED Triage Vitals  Enc Vitals Group     BP 09/21/20 1119 95/64     Pulse Rate 09/21/20 1119 (!) 103     Resp 09/21/20 1119 19     Temp 09/21/20 1119 99.5 F (37.5 C)     Temp Source 09/21/20 1119 Oral     SpO2 09/21/20 1119 98 %     Weight 09/21/20 1110 140 lb (63.5 kg)     Height 09/21/20 1110 5' 3"  (1.6 m)     Head Circumference --      Peak Flow --      Pain Score 09/21/20 1109 10     Pain Loc --      Pain Edu? --      Excl. in [place]? --  Constitutional: Alert and oriented. Well appearing and in no acute distress. Eyes: Conjunctivae are normal. PERRL. EOMI. Head: Atraumatic. Nose: No congestion/rhinnorhea. Mouth/Throat: Mucous membranes are moist.   Neck: No stridor.   Cardiovascular: Normal rate, regular rhythm. Grossly normal heart sounds.  Good peripheral circulation. Respiratory: Normal respiratory effort.  No retractions. Lungs CTAB.  No expiratory wheeze noted. Gastrointestinal: Soft and nontender. No distention. No CVA tenderness. Musculoskeletal: Patient is able move upper and lower extremities without any difficulty.  Normal gait was noted.  Patient had no edema noted to the lower extremities. Neurologic:  Normal speech and language. No gross focal neurologic deficits are appreciated.  Skin:  Skin is warm, dry and intact. No rash noted. Psychiatric: Mood and affect are normal. Speech and behavior are normal.  ____________________________________________   LABS (all labs ordered are listed, but only abnormal results are displayed)  Labs Reviewed  RESP PANEL BY RT-PCR (FLU A&B, COVID) ARPGX2 - Abnormal; Notable for the following components:      Result  Value   SARS Coronavirus 2 by RT PCR POSITIVE (*)    All other components within normal limits  CBC WITH DIFFERENTIAL/PLATELET - Abnormal; Notable for the following components:   Lymphs Abs 0.4 (*)    Monocytes Absolute 1.1 (*)    All other components within normal limits  COMPREHENSIVE METABOLIC PANEL - Abnormal; Notable for the following components:   Glucose, Bld 110 (*)    Alkaline Phosphatase 35 (*)    All other components within normal limits  URINALYSIS, COMPLETE (UACMP) WITH MICROSCOPIC  POC URINE PREG, ED  TROPONIN I (HIGH SENSITIVITY)  TROPONIN I (HIGH SENSITIVITY)   ____________________________________________  EKG Reviewed by physician in Main ED. Vent. rate 96 BPM PR interval 136 ms QRS duration 68 ms QT/QTcB 314/396 ms P-R-T axes 74 77 -37 Normal sinus rhythm Cannot rule out Anterior infarct , age undetermined Abnormal ECG ____________________________________________  RADIOLOGY I, Johnn Hai, personally viewed and evaluated these images (plain radiographs) as part of my medical decision making, as well as reviewing the written report by the radiologist.   Official radiology report(s): DG Chest Port 1 View  Result Date: 09/21/2020 CLINICAL DATA:  Chest pain, shortness of breath, bilateral leg pain, headache and fever for 6 days. EXAM: PORTABLE CHEST 1 VIEW COMPARISON:  PA and lateral chest 08/27/2016. FINDINGS: Lungs clear. Heart size normal. No pneumothorax or pleural fluid. No bony abnormality. IMPRESSION: Normal chest. Electronically Signed   By: Inge Rise M.D.   On: 09/21/2020 13:37    ____________________________________________   PROCEDURES  Procedure(s) performed (including Critical Care):  Procedures   ____________________________________________   INITIAL IMPRESSION / ASSESSMENT AND PLAN / ED COURSE  As part of my medical decision making, I reviewed the following data within the electronic MEDICAL RECORD NUMBER Notes from prior ED  visits and [place] Controlled Substance Database     ----------------------------------------- 1:48 PM on 09/21/2020 ----------------------------------------- 30 year old female presents to the ED with complaint of fever and body aches especially to the lower extremities.  Patient reports some nausea without vomiting or diarrhea.  She denies any one in her family with similar symptoms.  Patient has had the symptoms for the last 6 days.  While waiting on labs patient asked for pain medication.  She was reminded that she had not given a urine to obtain her pregnancy status and also to send for complete urinalysis.  At that point she asked that her IV be removed so she could leave.  She  announced to nurses that she was going however they thought she was going to the restroom and instead the female companion with her pushed her out in a wheelchair.  Within 5 minutes of her leaving it was noted that her COVID test was positive.  Nursing staff did call to let her know that she was positive for COVID and had been most likely for the last 6 days.  She is to continue to quarantine for at least another 4 days and to notify people that she has been around that she is positive.  ____________________________________________   FINAL CLINICAL IMPRESSION(S) / ED DIAGNOSES  Final diagnoses:  COVID-19     ED Discharge Orders     None        Note:  This document was prepared using Dragon voice recognition software and may include unintentional dictation errors.    Johnn Hai, PA-C 09/21/20 1538    Lavonia Drafts, MD 09/22/20 [phone]

## 2020-09-21 NOTE — ED Notes (Signed)
Patient complains of chest pain, leg pain and nausea. Emesis bag provided. Provider notified.

## 2020-09-21 NOTE — ED Notes (Signed)
Pt walked out unabl;e to ask patient to sign ama form

## 2020-09-21 NOTE — ED Triage Notes (Addendum)
PT c/o CP, SOB, bilateral leg pain and headache, fever x6 days. Pt took 2 negative home covid tests. Pt denies emesis and diarrhea, c/o nausea. Pt has been taking tylenol for fever.

## 2020-09-21 NOTE — ED Notes (Signed)
Pt requested iv to be removed. Ed rn asked if patient could get a urine sample and patient refused

## 2020-09-25 ENCOUNTER — Encounter: Payer: Self-pay | Admitting: Emergency Medicine

## 2020-09-25 ENCOUNTER — Ambulatory Visit
Admission: EM | Admit: 2020-09-25 | Discharge: 2020-09-25 | Disposition: A | Discharge status: Home / Self Care | Payer: Medicaid Other | Attending: Emergency Medicine | Admitting: Emergency Medicine

## 2020-09-25 ENCOUNTER — Other Ambulatory Visit: Payer: Self-pay

## 2020-09-25 DIAGNOSIS — U071 COVID-19: Secondary | ICD-10-CM | POA: Insufficient documentation

## 2020-09-25 DIAGNOSIS — J029 Acute pharyngitis, unspecified: Secondary | ICD-10-CM | POA: Insufficient documentation

## 2020-09-25 LAB — POCT RAPID STREP A: Streptococcus, Group A Screen (Direct): NEGATIVE

## 2020-09-25 MED ORDER — HYDROCOD POLST-CPM POLST ER 10-8 MG/5ML PO SUER: 5.0000 mL | Freq: Two times a day (BID) | ORAL | 0 refills | Status: DC | PRN

## 2020-09-25 MED ORDER — BENZONATATE 200 MG PO CAPS: 200.0000 mg | ORAL_CAPSULE | Freq: Three times a day (TID) | ORAL | 0 refills | Status: DC | PRN

## 2020-09-25 MED ORDER — ALBUTEROL SULFATE HFA 108 (90 BASE) MCG/ACT IN AERS: 1.0000 | INHALATION_SPRAY | RESPIRATORY_TRACT | 0 refills | Status: DC | PRN

## 2020-09-25 MED ORDER — AEROCHAMBER PLUS MISC: 2 refills | Status: DC

## 2020-09-25 NOTE — Discharge Instructions (Addendum)
your rapid strep was negative today.  500-1,000 mg of Tylenol 3-4 times a day as needed for pain.  Make sure you drink plenty of extra fluids.  Some people find salt water gargles and  [employer]'s "Throat Coat" tea helpful. Take 5 mL of liquid Benadryl and 5 mL of Maalox. Mix it together, and then hold it in your mouth for as long as you can and then swallow. You may do this 4 times a day.  2 puffs from your albuterol inhaler using your spacer every 4 hours for 2 days, then every 6 hours for 2 days, then as needed.  You may back off on the albuterol if you start to feel better sooner.  Tussionex for the cough at night, Tessalon for the cough during the day.  Go to www.goodrx.com  or www.costplusdrugs.com to look up your medications. This will give you a list of where you can find your prescriptions at the most affordable prices. Or ask the pharmacist what the cash price is, or if they have any other discount programs available to help make your medication more affordable. This can be less expensive than what you would pay with insurance.

## 2020-09-25 NOTE — ED Provider Notes (Signed)
HPI  SUBJECTIVE:  Kathleen Owen is a 30 y.o. female who presents with sore throat for the past 3 days.  Noticed a white spot on her left tonsillar pillar yesterday.  She reports fevers 101.8, body aches, headaches, sinus pain and pressure, cough, chest soreness secondary to the cough, dyspnea on exertion occasional wheezing.  She is not sure she is short of breath at rest.  No nasal congestion, rhinorrhea, postnasal drip, loss of sense of smell or taste nausea, vomiting, diarrhea.  No known COVID exposure.  She did not get the vaccine.  No antibiotics in the past month.  She took Tylenol within 6 hours of evaluation.  She has been taking Tylenol, doing warm salt water gargles without improvement in her symptoms.  Symptoms are worse with swallowing.  Patient was seen in the ER on 7/30 for fever, nausea, chest pain, shortness of breath, bilateral leg pain and headache.  COVID was positive.  She left before this was resulted, however, patient was notified by the ED staff.   Patient states that she has been sick for 6, not 10, days.  Patient did not get the COVID-vaccine. she has a history of Crohn's.  She is not currently on any prednisone or DMARDs.  She has a history of frequent strep status post tonsillectomy, C. difficile colitis.  No history of aphthous ulcers, HSV.  LMP: 7/20.  Denies possibility being pregnant.  PMD: [facility].  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   Bacterial vaginosis August 2016   recurrent BV   CD (Crohn's disease) ([place]) 07/30/2014   Crohn's disease ([place]) Jan 2016   Depression    Miscarriage 07/18/2014    Past Surgical History:  Procedure Laterality Date   DILATION AND CURETTAGE OF UTERUS     DILATION AND EVACUATION N/A 12/02/2016   Procedure: DILATATION AND EVACUATION;  Surgeon: Harlin Heys, MD;  Location: [facility] ORS;  Service: Gynecology;  Laterality: N/A;   DILATION AND EVACUATION N/A 06/30/2017   Procedure: DILATATION  AND EVACUATION;  Surgeon: Harlin Heys, MD;  Location: [facility] ORS;  Service: Gynecology;  Laterality: N/A;   DILATION AND EVACUATION N/A 12/06/2018   Procedure: SUCTION D&C;  Surgeon: [name], MD;  Location: [facility] ORS;  Service: Gynecology;  Laterality: N/A;   MYOMECTOMY N/A 04/06/2019   Procedure: MYOMECTOMY VIA LAPAROTOMY;  Surgeon: [name], MD;  Location: [facility] ORS;  Service: Gynecology;  Laterality: N/A;   TONSILLECTOMY      Family History  Problem Relation Age of Onset   Healthy Mother    Healthy Father    Kidney disease Maternal Uncle    Heart failure Maternal Uncle    Heart failure Maternal Grandmother    Hypertension Paternal Grandmother    Cancer Paternal Grandmother    Thyroid disease Paternal Aunt    Cancer Paternal Grandfather    Mental illness Neg Hx     Social History   Tobacco Use   Smoking status: Every Day    Types: Cigars   Smokeless tobacco: Never   Tobacco comments:    black and mild 2-3 cigars a day  Vaping Use   Vaping Use: Never used  Substance Use Topics   Alcohol use: Yes    Alcohol/week: 2.0 standard drinks    Types: 2 Cans of beer per week    Comment: 2x/mo   Drug use: Not Currently    Types: Marijuana    Comment: last use age 42  No current facility-administered medications for this encounter.  Current Outpatient Medications:    albuterol (VENTOLIN HFA) 108 (90 Base) MCG/ACT inhaler, Inhale 1-2 puffs into the lungs every 4 (four) hours as needed for wheezing or shortness of breath., Disp: 1 each, Rfl: 0   benzonatate (TESSALON) 200 MG capsule, Take 1 capsule (200 mg total) by mouth 3 (three) times daily as needed for cough., Disp: 30 capsule, Rfl: 0   chlorpheniramine-HYDROcodone (TUSSIONEX PENNKINETIC ER) 10-8 MG/5ML SUER, Take 5 mLs by mouth every 12 (twelve) hours as needed for cough., Disp: 60 mL, Rfl: 0   Spacer/Aero-Holding Chambers (AEROCHAMBER PLUS) inhaler, Use with inhaler, Disp: 1 each, Rfl: 2    acetaminophen (TYLENOL) 500 MG tablet, Take by mouth. (Patient not taking: Reported on 06/25/2020), Disp: , Rfl:    dicyclomine (BENTYL) 20 MG tablet, Take 1 tablet (20 mg total) by mouth every 6 (six) hours., Disp: 20 tablet, Rfl: 0   DULoxetine (CYMBALTA) 30 MG capsule, Take 1 capsule (30 mg total) by mouth daily. (Patient not taking: Reported on 06/25/2020), Disp: 30 capsule, Rfl: 1   Multiple Vitamins-Minerals (MULTIVITAMIN WITH MINERALS) tablet, Take 1 tablet by mouth daily., Disp: 100 tablet, Rfl: 0   norethindrone (AYGESTIN) 5 MG tablet, Take 1 tablet (5 mg total) by mouth daily. (Patient not taking: Reported on 06/25/2020), Disp: 30 tablet, Rfl: 11   ondansetron (ZOFRAN ODT) 4 MG disintegrating tablet, Take 1 tablet (4 mg total) by mouth every 8 (eight) hours as needed for nausea or vomiting., Disp: 20 tablet, Rfl: 0   polyethylene glycol powder (GLYCOLAX/MIRALAX) 17 GM/SCOOP powder, Take by mouth as directed by your [facility] GI provider. (Patient not taking: Reported on 06/25/2020), Disp: , Rfl:    simethicone (MYLICON) 827 MG chewable tablet, Chew by mouth.  (Patient not taking: Reported on 06/25/2020), Disp: , Rfl:    traZODone (DESYREL) 100 MG tablet, Take 1-1.5 tablets (100-150 mg total) by mouth at bedtime as needed for sleep. (Patient not taking: Reported on 06/25/2020), Disp: 45 tablet, Rfl: 1  Allergies  Allergen Reactions   Aspirin Other (See Comments)    Chron's Disease Chron's Disease Other reaction(s): Other (See Comments) Chron's Disease   Ibuprofen Other (See Comments)    Chron's Disease Chron's Disease Other reaction(s): Other (See Comments), Other (See Comments) Chron's Disease "blow flare" per pt   Amoxicillin Rash and Other (See Comments)    Has patient had a PCN reaction causing immediate rash, facial/tongue/throat swelling, SOB or lightheadedness with hypotension: Unknown Has patient had a PCN reaction causing severe rash involving mucus membranes or skin necrosis: Unknown Has  patient had a PCN reaction that required hospitalization: Unknown Has patient had a PCN reaction occurring within the last 10 years: No If all of the above answers are "NO", then may proceed with Cephalosporin use.  Other reaction(s): Other (See Comments) Has patient had a PCN reaction causing immediate rash, facial/tongue/throat swelling, SOB or lightheadedness with hypotension: Unknown Has patient had a PCN reaction causing severe rash involving mucus membranes or skin necrosis: Unknown Has patient had a PCN reaction that required hospitalization: Unknown Has patient had a PCN reaction occurring within the last 10 years: No If all of the above answers are "NO", then may proceed with Cephalosporin use.     ROS  As noted in HPI.   Physical Exam  BP 97/75 (BP Location: Left Arm)   Pulse 84   Temp 98.5 F (36.9 C)   Resp 18   LMP 09/10/2020 (Exact Date)  SpO2 100%   Constitutional: Well developed, well nourished, no acute distress Eyes:  EOMI, conjunctiva normal bilaterally HENT: Normocephalic, atraumatic,mucus membranes moist.  Mild nasal congestion.  Tonsils surgically absent.  Erythematous spot with a white lesion on the left tonsillar pillar where tonsil was.  Otherwise no other intraoral lesions. Neck: Positive shotty cervical lymphadenopathy Respiratory: Normal inspiratory effort, lungs clear bilaterally Cardiovascular: Normal rate, regular rhythm no murmurs GI: nondistended soft, nontender, no splenomegaly skin: No rash, skin intact Musculoskeletal: no deformities Neurologic: Alert & oriented x 3, no focal neuro deficits Psychiatric: Speech and behavior appropriate   ED Course   Medications - No data to display  Orders Placed This Encounter  Procedures   After Hours POC Rapid Strep A    Standing Status:   Standing    Number of Occurrences:   1   POCT rapid strep A    Standing Status:   Standing    Number of Occurrences:   1    Results for orders placed or  performed during the hospital encounter of 09/25/20 (from the past 24 hour(s))  POCT rapid strep A     Status: None   Collection Time: 09/25/20  5:38 PM  Result Value Ref Range   Streptococcus, Group A Screen (Direct) NEGATIVE NEGATIVE   No results found.  ED Clinical Impression  1. COVID-19 virus infection   2. Viral pharyngitis      ED Assessment/Plan  ER records reviewed.  As noted in HPI.  Strep negative.  Patient has an aphthous ulcer in her throat.  Suspect this is triggered by COVID infection will send home with Benadryl/Maalox, 500-1,000 mg of Tylenol 3-4 times a day as needed for pain.  Tessalon for the cough, Tussionex for the cough at night.  We will send home with regularly scheduled albuterol for the next 4 days for the wheezing, shortness of breath and cough given her history of asthma although her lungs are clear on my exam today.  She is to back off on the albuterol if she is feels better sooner.  Follow-up with PMD as needed.  [place] Narcotic database reviewed for this patient, and feel that the risk/benefit ratio today is favorable for proceeding with a prescription for controlled substance.   Discussed labs, MDM, treatment plan, and plan for follow-up with patient.\patient agrees with plan.   Meds ordered this encounter  Medications   chlorpheniramine-HYDROcodone (TUSSIONEX PENNKINETIC ER) 10-8 MG/5ML SUER    Sig: Take 5 mLs by mouth every 12 (twelve) hours as needed for cough.    Dispense:  60 mL    Refill:  0   benzonatate (TESSALON) 200 MG capsule    Sig: Take 1 capsule (200 mg total) by mouth 3 (three) times daily as needed for cough.    Dispense:  30 capsule    Refill:  0   albuterol (VENTOLIN HFA) 108 (90 Base) MCG/ACT inhaler    Sig: Inhale 1-2 puffs into the lungs every 4 (four) hours as needed for wheezing or shortness of breath.    Dispense:  1 each    Refill:  0   Spacer/Aero-Holding Chambers (AEROCHAMBER PLUS) inhaler    Sig: Use with inhaler     Dispense:  1 each    Refill:  2    Please educate patient on use       *This clinic note was created using Dragon dictation software. Therefore, there may be occasional mistakes despite careful proofreading.  ?    Sarahelizabeth Conway,  Caryl Pina, MD 09/27/20 0730

## 2020-09-25 NOTE — ED Triage Notes (Signed)
Pt presents today with c/o sore throat and cough She was diagnosed with Covid on 7/30 at [facility].

## 2020-09-28 LAB — CULTURE, GROUP A STREP (THRC)

## 2020-10-10 ENCOUNTER — Emergency Department: Payer: Medicaid Other

## 2020-10-10 ENCOUNTER — Encounter: Payer: Self-pay | Admitting: Emergency Medicine

## 2020-10-10 ENCOUNTER — Other Ambulatory Visit: Payer: Self-pay

## 2020-10-10 ENCOUNTER — Emergency Department
Admission: EM | Admit: 2020-10-10 | Discharge: 2020-10-10 | Disposition: A | Discharge status: Home / Self Care | Payer: Medicaid Other | Attending: Emergency Medicine | Admitting: Emergency Medicine

## 2020-10-10 DIAGNOSIS — R42 Dizziness and giddiness: Secondary | ICD-10-CM | POA: Insufficient documentation

## 2020-10-10 DIAGNOSIS — R079 Chest pain, unspecified: Secondary | ICD-10-CM | POA: Insufficient documentation

## 2020-10-10 DIAGNOSIS — Z8616 Personal history of COVID-19: Secondary | ICD-10-CM | POA: Insufficient documentation

## 2020-10-10 DIAGNOSIS — R1011 Right upper quadrant pain: Secondary | ICD-10-CM | POA: Insufficient documentation

## 2020-10-10 DIAGNOSIS — F17290 Nicotine dependence, other tobacco product, uncomplicated: Secondary | ICD-10-CM | POA: Insufficient documentation

## 2020-10-10 LAB — BASIC METABOLIC PANEL
Anion gap: 10 (ref 5–15)
BUN: 9 mg/dL (ref 6–20)
CO2: 24 mmol/L (ref 22–32)
Calcium: 9.2 mg/dL (ref 8.9–10.3)
Chloride: 105 mmol/L (ref 98–111)
Creatinine, Ser: 0.54 mg/dL (ref 0.44–1.00)
GFR, Estimated: >60 mL/min (ref >60)
Glucose, Bld: 121 mg/dL — ABNORMAL HIGH (ref 70–99)
Potassium: 3.4 mmol/L — ABNORMAL LOW (ref 3.5–5.1)
Sodium: 139 mmol/L (ref 135–145)

## 2020-10-10 LAB — CBC
HCT: 38.5 % (ref 36.0–46.0)
Hemoglobin: 13.6 g/dL (ref 12.0–15.0)
MCH: 32.5 pg (ref 26.0–34.0)
MCHC: 35.3 g/dL (ref 30.0–36.0)
MCV: 92.1 fL (ref 80.0–100.0)
Platelets: 198 K/uL (ref 150–400)
RBC: 4.18 MIL/uL (ref 3.87–5.11)
RDW: 13.3 % (ref 11.5–15.5)
WBC: 13.5 K/uL — ABNORMAL HIGH (ref 4.0–10.5)
nRBC: 0 % (ref 0.0–0.2)

## 2020-10-10 LAB — POC URINE PREG, ED: Preg Test, Ur: NEGATIVE

## 2020-10-10 LAB — TROPONIN I (HIGH SENSITIVITY)
Troponin I (High Sensitivity): <2 ng/L (ref <18)
Troponin I (High Sensitivity): <2 ng/L (ref <18)

## 2020-10-10 LAB — D-DIMER, QUANTITATIVE: D-Dimer, Quant: <0.27 ug/mL-FEU (ref 0.00–0.50)

## 2020-10-10 MED ORDER — LIDOCAINE 5 % EX PTCH
1.0000 | MEDICATED_PATCH | CUTANEOUS | Status: DC
  Administered 2020-10-10: 1 via TRANSDERMAL
  Filled 2020-10-10: qty 1

## 2020-10-10 MED ORDER — ACETAMINOPHEN 500 MG PO TABS
1000.0000 mg | ORAL_TABLET | Freq: Once | ORAL | Status: AC
  Administered 2020-10-10: 1000 mg via ORAL
  Filled 2020-10-10: qty 2

## 2020-10-10 MED ORDER — FENTANYL CITRATE (PF) 100 MCG/2ML IJ SOLN
50.0000 ug | Freq: Once | INTRAMUSCULAR | Status: AC
  Administered 2020-10-10: 50 ug via INTRAVENOUS
  Filled 2020-10-10: qty 2

## 2020-10-10 MED ORDER — PREDNISONE 10 MG (21) PO TBPK: ORAL_TABLET | ORAL | 0 refills | Status: DC

## 2020-10-10 MED ORDER — FAMOTIDINE 20 MG PO TABS: 20.0000 mg | ORAL_TABLET | Freq: Every day | ORAL | 1 refills | Status: DC

## 2020-10-10 MED ORDER — SODIUM CHLORIDE 0.9 % IV BOLUS
1000.0000 mL | Freq: Once | INTRAVENOUS | Status: AC
  Administered 2020-10-10: 1000 mL via INTRAVENOUS

## 2020-10-10 MED ORDER — LIDOCAINE VISCOUS HCL 2 % MT SOLN
15.0000 mL | Freq: Once | OROMUCOSAL | Status: AC
  Administered 2020-10-10: 15 mL via ORAL
  Filled 2020-10-10: qty 15

## 2020-10-10 MED ORDER — ALUM & MAG HYDROXIDE-SIMETH 200-200-20 MG/5ML PO SUSP
30.0000 mL | Freq: Once | ORAL | Status: AC
  Administered 2020-10-10: 30 mL via ORAL
  Filled 2020-10-10: qty 30

## 2020-10-10 NOTE — Discharge Instructions (Addendum)
Please seek medical attention for any high fevers, chest pain, shortness of breath, change in behavior, persistent vomiting, bloody stool or any other new or concerning symptoms.  

## 2020-10-10 NOTE — ED Triage Notes (Signed)
Pt reports that she woke up this am and became dizzy. She took her CBG and states that her blood sugar was high at 135 and that her B/P was low she is telling me that her B/P being 112/69 is really high for her. She is able to speak in complete clear sentences.

## 2020-10-10 NOTE — ED Notes (Signed)
Attending provider, Dr Archie Balboa, messaged per pt request for pain medication.

## 2020-10-10 NOTE — ED Provider Notes (Signed)
[facility] Emergency Department Provider Note   ____________________________________________   I have reviewed the triage vital signs and the nursing notes.   HISTORY  Chief Complaint Chest Pain   History limited by: Not Limited   HPI Kathleen Owen is a 30 y.o. female who presents to the emergency department today because of concerns for dizziness and chest pain.  Patient states she first noticed this dizziness this morning when she got up.  She said she sat up started feeling like the room was spinning around her.  This feeling has persisted throughout the day.  Shortly after the dizziness she noticed some pain in her right chest.  She denies any significant shortness of breath has had a slight amount of shortness of breath.  She states that yesterday she did have some right leg pain. Denies any recent fevers. States she was diagnosed with COVID last month.   Records reviewed. Per medical record review patient has a history of recent covid.   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   Bacterial vaginosis August 2016   recurrent BV   CD (Crohn's disease) ([place]) 07/30/2014   Crohn's disease ([place]) Jan 2016   Depression    Miscarriage 07/18/2014    Patient Active Problem List   Diagnosis Date Noted   PTSD (post-traumatic stress disorder) 10/11/2019   Depression 10/11/2019   Social anxiety disorder 10/11/2019   Tobacco use disorder 10/11/2019   Uterine fibroid 04/06/2019   Supervision of high risk pregnancy, antepartum 11/24/2018   UTI (urinary tract infection) during pregnancy 07/19/2018   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/2016   GAD (generalized anxiety disorder) 09/17/2015   Major depressive disorder, single episode, moderate ([place]) 09/17/2015    Smoker 11/22/2014   Recurrent vaginitis 11/22/2014   Airway hyperreactivity 07/30/2014   CD (Crohn's disease) ([place]) 07/30/2014   Body mass index (BMI) of 27.0-27.9 in adult 10/03/2013    Past Surgical History:  Procedure Laterality Date   DILATION AND CURETTAGE OF UTERUS     DILATION AND EVACUATION N/A 12/02/2016   Procedure: DILATATION AND EVACUATION;  Surgeon: Harlin Heys, MD;  Location: [facility] ORS;  Service: Gynecology;  Laterality: N/A;   DILATION AND EVACUATION N/A 06/30/2017   Procedure: DILATATION AND EVACUATION;  Surgeon: Harlin Heys, MD;  Location: [facility] ORS;  Service: Gynecology;  Laterality: N/A;   DILATION AND EVACUATION N/A 12/06/2018   Procedure: SUCTION D&C;  Surgeon: [name], MD;  Location: [facility] ORS;  Service: Gynecology;  Laterality: N/A;   MYOMECTOMY N/A 04/06/2019   Procedure: MYOMECTOMY VIA LAPAROTOMY;  Surgeon: [name], MD;  Location: [facility] ORS;  Service: Gynecology;  Laterality: N/A;   TONSILLECTOMY      Prior to Admission medications   Medication Sig Start Date End Date Taking? Authorizing Provider  acetaminophen (TYLENOL) 500 MG tablet Take by mouth. Patient not taking: Reported on 06/25/2020    [provider]  albuterol (VENTOLIN HFA) 108 (90 Base) MCG/ACT inhaler Inhale 1-2 puffs into the lungs every 4 (four) hours as needed for wheezing or shortness of breath. 09/25/20   Melynda Ripple, MD  benzonatate (TESSALON) 200 MG capsule Take 1 capsule (200 mg total) by mouth 3 (three) times daily as needed for cough. 09/25/20   Melynda Ripple, MD  chlorpheniramine-HYDROcodone [facility]  ER) 10-8 MG/5ML SUER Take 5 mLs by mouth every 12 (twelve) hours as needed for cough. 09/25/20   Melynda Ripple, MD  dicyclomine (BENTYL) 20 MG tablet Take 1 tablet (20 mg total) by mouth every 6 (six) hours. 09/11/20   Paulette Blanch, MD  DULoxetine (CYMBALTA) 30 MG capsule Take 1 capsule (30 mg total) by mouth daily. Patient not  taking: Reported on 06/25/2020 10/11/19   Ursula Alert, MD  Multiple Vitamins-Minerals (MULTIVITAMIN WITH MINERALS) tablet Take 1 tablet by mouth daily. 08/02/19   Jerene Dilling, PA  norethindrone (AYGESTIN) 5 MG tablet Take 1 tablet (5 mg total) by mouth daily. Patient not taking: Reported on 06/25/2020 11/13/19   [name], MD  ondansetron (ZOFRAN ODT) 4 MG disintegrating tablet Take 1 tablet (4 mg total) by mouth every 8 (eight) hours as needed for nausea or vomiting. 09/11/20   Paulette Blanch, MD  polyethylene glycol powder (GLYCOLAX/MIRALAX) 17 GM/SCOOP powder Take by mouth as directed by your [facility] provider. Patient not taking: Reported on 06/25/2020 10/02/19   [provider]  simethicone (MYLICON) 706 MG chewable tablet Chew by mouth.  Patient not taking: Reported on 06/25/2020 10/02/19   [provider]  Spacer/Aero-Holding Chambers (AEROCHAMBER PLUS) inhaler Use with inhaler 09/25/20   Melynda Ripple, MD  traZODone (DESYREL) 100 MG tablet Take 1-1.5 tablets (100-150 mg total) by mouth at bedtime as needed for sleep. Patient not taking: Reported on 06/25/2020 10/11/19   Ursula Alert, MD    Allergies Aspirin, Ibuprofen, and Amoxicillin  Family History  Problem Relation Age of Onset   Healthy Mother    Healthy Father    Kidney disease Maternal Uncle    Heart failure Maternal Uncle    Heart failure Maternal Grandmother    Hypertension Paternal Grandmother    Cancer Paternal Grandmother    Thyroid disease Paternal Aunt    Cancer Paternal Grandfather    Mental illness Neg Hx     Social History Social History   Tobacco Use   Smoking status: Every Day    Types: Cigars   Smokeless tobacco: Never   Tobacco comments:    black and mild 2-3 cigars a day  Vaping Use   Vaping Use: Never used  Substance Use Topics   Alcohol use: Yes    Alcohol/week: 2.0 standard drinks    Types: 2 Cans of beer per week    Comment: 2x/mo   Drug use: Not Currently    Types:  Marijuana    Comment: last use age 55    Review of Systems Constitutional: No fever/chills Eyes: Positive for blurry vision. ENT: No sore throat. Cardiovascular: Positive for chest pain. Respiratory: Denies shortness of breath. Gastrointestinal: No abdominal pain.  No nausea, no vomiting.  No diarrhea.   Genitourinary: Negative for dysuria. Musculoskeletal: Positive for right leg pain. Skin: Negative for rash. Neurological: Positive for dizziness.  ____________________________________________   PHYSICAL EXAM:  VITAL SIGNS: ED Triage Vitals  Enc Vitals Group     BP 10/10/20 1218 112/69     Pulse Rate 10/10/20 1218 90     Resp 10/10/20 1218 20     Temp 10/10/20 1218 98.8 F (37.1 C)     Temp Source 10/10/20 1218 Oral     SpO2 10/10/20 1218 100 %     Weight 10/10/20 1219 135 lb (61.2 kg)     Height 10/10/20 1219 5' 3"  (1.6 m)     Head Circumference --  Peak Flow --      Pain Score 10/10/20 1219 5   Constitutional: Alert and oriented.  Eyes: Conjunctivae are normal.  ENT      Head: Normocephalic and atraumatic.      Nose: No congestion/rhinnorhea.      Mouth/Throat: Mucous membranes are moist.      Neck: No stridor. Hematological/Lymphatic/Immunilogical: No cervical lymphadenopathy. Cardiovascular: Normal rate, regular rhythm.  No murmurs, rubs, or gallops.  Respiratory: Normal respiratory effort without tachypnea nor retractions. Breath sounds are clear and equal bilaterally. No wheezes/rales/rhonchi. Gastrointestinal: Soft and non tender. No rebound. No guarding.  Genitourinary: Deferred Musculoskeletal: Normal range of motion in all extremities. No lower extremity edema. Neurologic:  Normal speech and language. No gross focal neurologic deficits are appreciated.  Skin:  Skin is warm, dry and intact. No rash noted. Psychiatric: Mood and affect are normal. Speech and behavior are normal. Patient exhibits appropriate insight and  judgment.  ____________________________________________    LABS (pertinent positives/negatives)  Upreg negative Trop hs <2 BMP wnl except k 3.4, glu 121 CBC wbc 13.5, hgb 13.6, plt 198  ____________________________________________   EKG  I, Nance Pear, attending physician, personally viewed and interpreted this EKG  EKG Time: 1214 Rate: 102 Rhythm: sinus tachycardia Axis: normal Intervals: qtc 396 QRS: narrow ST changes: no st elevation, t wave inversion III, aVF, V3-V6 Impression: abnormal ekg   ____________________________________________    RADIOLOGY  CXR No active cardiopulmonary disease  ____________________________________________   PROCEDURES  Procedures  ____________________________________________   INITIAL IMPRESSION / ASSESSMENT AND PLAN / ED COURSE  Pertinent labs & imaging results that were available during my care of the patient were reviewed by me and considered in my medical decision making (see chart for details).   Patient presented to the emergency department today because of concern for dizziness and chest pain. Work up without concerning electrolyte abnormality. Slight leukocytosis. CXR without pneumonia. The patient did have troponin and d-dimer sent both of which were without concern. She did feel better in terms of the dizziness after IVFs however persisted with her right sided chest pain. Because of this work up was broadened to include RUQ [place] to evaluate GB. This did not show any concerning GB disease. The patient was given multiple pain medications and modality of medications to see if her pain would improve. Discussed return precautions with patient.   ____________________________________________   FINAL CLINICAL IMPRESSION(S) / ED DIAGNOSES  Final diagnoses:  Nonspecific chest pain  RUQ pain     Note: This dictation was prepared with Dragon dictation. Any transcriptional errors that result from this process are  unintentional     Nance Pear, MD 10/10/20 1946

## 2020-10-29 NOTE — Progress Notes (Signed)
[facility]   Chief Complaint  Patient presents with   Vaginal Pain    Pt started spotting, now blood clots since Saturday after intercourse   Urinary Tract Infection    Frequency and burning urinating, right lower back pain    HPI:      [name] is a 30 y.o. [id number] whose LMP was Patient's last menstrual period was 10/18/2020 (exact date)., presents today for pelvic pain and AUB for 5 days; sx started during sex when she and her partner felt a "pop". Pt started having severe stabbing pain at that time pelvic to RLQ that has persisted and is constant. No relief with tylenol or heating pad.  Has been bleeding since then, heavy flow with large clots. No recent UPT. No vag sx, GI sx. Has had urinary frequency, dysuria and urgency for the past 5 days. Hx of ovar cysts and myomectomy 2/21.   Menses are usually monthly, lasting 7 days, heavy flow for several days with quarter sized clots, and severe dysmen, not improved with tylenol/heating pad (can't have NSAIDs due to Crohns). Also has vomiting due to pain. Wonders if she has endometriosis although no lesions seen on myomectomy surg with DR. Staebler.   Pt is sex active, no new partners. Trying to conceive. Hx of 24 SAB, pt referred to [facility] but they don't take MCD. Hx of dyspareunia since sex active. 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   Bacterial vaginosis August 2016   recurrent BV   CD (Crohn's disease) ([place]) 07/30/2014   Crohn's disease ([place]) Jan 2016   Depression    Miscarriage 07/18/2014    Past Surgical History:  Procedure Laterality Date   DILATION AND CURETTAGE OF UTERUS     DILATION AND EVACUATION N/A 12/02/2016   Procedure: DILATATION AND EVACUATION;  Surgeon: Harlin Heys, MD;  Location: [facility] ORS;  Service: Gynecology;  Laterality: N/A;   DILATION AND EVACUATION N/A 06/30/2017   Procedure: DILATATION AND EVACUATION;  Surgeon: Harlin Heys, MD;  Location: [facility] ORS;  Service: Gynecology;  Laterality: N/A;   DILATION AND EVACUATION N/A 12/06/2018   Procedure: SUCTION D&C;  Surgeon: [name], MD;  Location: [facility] ORS;  Service: Gynecology;  Laterality: N/A;   MYOMECTOMY N/A 04/06/2019   Procedure: MYOMECTOMY VIA LAPAROTOMY;  Surgeon: [name], MD;  Location: [facility] ORS;  Service: Gynecology;  Laterality: N/A;   TONSILLECTOMY      Family History  Problem Relation Age of Onset   Healthy Mother    Healthy Father    Kidney disease Maternal Uncle    Heart failure Maternal Uncle    Thyroid disease Paternal Aunt    Heart failure Maternal Grandmother    Hypertension Paternal Grandmother    Cancer Paternal Grandmother    Cancer Paternal Grandfather    Mental illness Neg Hx     Social History   Socioeconomic History   Marital status: Married    Spouse name: Not on file   Number of children: Not on file   Years of education: Not on file   Highest education level: Not on file  Occupational History   Occupation: unemployed  Tobacco Use   Smoking status: Every Day    Types: Cigars   Smokeless tobacco: Never   Tobacco comments:    black and mild 2-3 cigars a day  Vaping Use   Vaping Use: Never used  Substance and Sexual  Activity   Alcohol use: Yes    Alcohol/week: 2.0 standard drinks    Types: 2 Cans of beer per week    Comment: 2x/mo   Drug use: Not Currently    Types: Marijuana    Comment: last use age 16   Sexual activity: Yes    Partners: Male    Birth control/protection: None  Other Topics Concern   Not on file  Social History Narrative   Not on file   Social Determinants of Health   Financial Resource Strain: Not on file  Food Insecurity: Not on file  Transportation Needs: Not on file  Physical Activity: Not on file  Stress: Not on file  Social Connections: Not on file  Intimate Partner Violence: Not on file    Outpatient Medications Prior to Visit  Medication Sig Dispense  Refill   acetaminophen (TYLENOL) 500 MG tablet Take by mouth. (Patient not taking: Reported on 06/25/2020)     albuterol (VENTOLIN HFA) 108 (90 Base) MCG/ACT inhaler Inhale 1-2 puffs into the lungs every 4 (four) hours as needed for wheezing or shortness of breath. 1 each 0   benzonatate (TESSALON) 200 MG capsule Take 1 capsule (200 mg total) by mouth 3 (three) times daily as needed for cough. 30 capsule 0   chlorpheniramine-HYDROcodone (TUSSIONEX PENNKINETIC ER) 10-8 MG/5ML SUER Take 5 mLs by mouth every 12 (twelve) hours as needed for cough. 60 mL 0   dicyclomine (BENTYL) 20 MG tablet Take 1 tablet (20 mg total) by mouth every 6 (six) hours. 20 tablet 0   DULoxetine (CYMBALTA) 30 MG capsule Take 1 capsule (30 mg total) by mouth daily. (Patient not taking: Reported on 06/25/2020) 30 capsule 1   famotidine (PEPCID) 20 MG tablet Take 1 tablet (20 mg total) by mouth daily. 30 tablet 1   Multiple Vitamins-Minerals (MULTIVITAMIN WITH MINERALS) tablet Take 1 tablet by mouth daily. 100 tablet 0   norethindrone (AYGESTIN) 5 MG tablet Take 1 tablet (5 mg total) by mouth daily. (Patient not taking: Reported on 06/25/2020) 30 tablet 11   ondansetron (ZOFRAN ODT) 4 MG disintegrating tablet Take 1 tablet (4 mg total) by mouth every 8 (eight) hours as needed for nausea or vomiting. 20 tablet 0   polyethylene glycol powder (GLYCOLAX/MIRALAX) 17 GM/SCOOP powder Take by mouth as directed by your [facility] provider. (Patient not taking: Reported on 06/25/2020)     predniSONE (STERAPRED UNI-PAK 21 TAB) 10 MG (21) TBPK tablet Per packaging instructions 21 each 0   simethicone (MYLICON) 397 MG chewable tablet Chew by mouth.  (Patient not taking: Reported on 06/25/2020)     Spacer/Aero-Holding Chambers (AEROCHAMBER PLUS) inhaler Use with inhaler 1 each 2   traZODone (DESYREL) 100 MG tablet Take 1-1.5 tablets (100-150 mg total) by mouth at bedtime as needed for sleep. (Patient not taking: Reported on 06/25/2020) 45 tablet 1   No  facility-administered medications prior to visit.      ROS:  Review of Systems  Constitutional:  Negative for fever.  Gastrointestinal:  Negative for blood in stool, constipation, diarrhea, nausea and vomiting.  Genitourinary:  Positive for dyspareunia, dysuria, frequency, menstrual problem, pelvic pain and urgency. Negative for flank pain, hematuria, vaginal bleeding, vaginal discharge and vaginal pain.  Musculoskeletal:  Negative for back pain.  Skin:  Negative for rash.  BREAST: No symptoms   OBJECTIVE:   Vitals:  BP 100/60   Ht 5' 3"  (1.6 m)   Wt 127 lb (57.6 kg)   LMP 10/18/2020 (Exact  Date)   Breastfeeding No   BMI 22.50 kg/m   Physical Exam Vitals reviewed.  Constitutional:      Appearance: She is well-developed. She is ill-appearing.  Pulmonary:     Effort: Pulmonary effort is normal.  Abdominal:     Tenderness: There is abdominal tenderness in the right lower quadrant, suprapubic area and left lower quadrant. There is guarding.  Genitourinary:    General: Normal vulva.     Pubic Area: No rash.      Labia:        Right: No rash, tenderness or lesion.        Left: No rash, tenderness or lesion.      Vagina: Bleeding present. No vaginal discharge, erythema or tenderness.     Cervix: Cervical motion tenderness present.     Uterus: Normal. Tender. Not enlarged.      Adnexa:        Right: Tenderness present. No mass.         Left: Tenderness present. No mass.    Musculoskeletal:        General: Normal range of motion.     Cervical back: Normal range of motion.  Skin:    General: Skin is warm and dry.  Neurological:     General: No focal deficit present.     Mental Status: She is alert and oriented to person, place, and time.  Psychiatric:        Mood and Affect: Mood normal.        Behavior: Behavior normal.        Thought Content: Thought content normal.        Judgment: Judgment normal.    Results: Results for orders placed or performed in visit on  10/30/20 (from the past 24 hour(s))  POCT urine pregnancy     Status: Normal   Collection Time: 10/30/20  2:51 PM  Result Value Ref Range   Preg Test, Ur Negative Negative  POCT Urinalysis Dipstick     Status: Abnormal   Collection Time: 10/30/20  2:52 PM  Result Value Ref Range   Color, UA yellow    Clarity, UA clear    Glucose, UA Negative Negative   Bilirubin, UA neg    Ketones, UA neg    Spec Grav, UA 1.025 1.010 - 1.025   Blood, UA mod    pH, UA 5.0 5.0 - 8.0   Protein, UA Negative Negative   Urobilinogen, UA     Nitrite, UA neg    Leukocytes, UA Negative Negative   Appearance     Odor    PT WITH MENSTRUAL BLEEDING   Assessment/Plan: Pelvic pain - Plan: US PELVIC COMPLETE WITH TRANSVAGINAL, POCT urine pregnancy, acetaminophen-codeine (TYLENOL #3) 300-30 MG tablet; very tender on exam and pt stays bent over due to pain. Pos CMT/speculum exam very painful. Pt in tears due to pain with exam. STAT GYN u/s was negative, rule out STDs. Neg UPT. Will treat for PID. Rx doxy and flagyl eRxd, pt to [facility] tomorrow for rocephin injection. RTO in 2 wks for f/u.   Screening for STD (sexually transmitted disease) - Plan: Cytology - PAP  UTI symptoms - Plan: POCT Urinalysis Dipstick; neg UA. Check C&S. Will f/u if pos.  Cervical cancer screening - Plan: Cytology - PAP,   Screening for HPV (human papillomavirus) - Plan: Cytology - PAP,    Meds ordered this encounter  Medications   acetaminophen-codeine (TYLENOL #3) 300-30 MG tablet  Sig: Take 1 tablet by mouth every 4 (four) hours as needed for moderate pain.    Dispense:  12 tablet    Refill:  0    Order Specific Question:   Supervising Provider    Answer:   Gae Dry [[id number]]   doxycycline (VIBRAMYCIN) 100 MG capsule    Sig: Take 1 capsule (100 mg total) by mouth 2 (two) times daily for 14 days.    Dispense:  28 capsule    Refill:  0    Order Specific Question:   Supervising Provider    Answer:   Gae Dry  [[id number]]   metroNIDAZOLE (FLAGYL) 500 MG tablet    Sig: Take 1 tablet (500 mg total) by mouth 2 (two) times daily for 14 days.    Dispense:  28 tablet    Refill:  0    Order Specific Question:   Supervising Provider    Answer:   Gae Dry [[id number]]   cefTRIAXone (ROCEPHIN) injection 500 mg       Return in about 4 weeks (around 11/27/2020) for PID f/u.  Inga Noller B. Jilian West, PA-C 10/30/2020 4:51 PM

## 2020-10-30 ENCOUNTER — Encounter: Payer: Self-pay | Admitting: Obstetrics and Gynecology

## 2020-10-30 ENCOUNTER — Ambulatory Visit (INDEPENDENT_AMBULATORY_CARE_PROVIDER_SITE_OTHER): Payer: Medicaid Other | Admitting: Obstetrics and Gynecology

## 2020-10-30 ENCOUNTER — Ambulatory Visit: Admission: RE | Admit: 2020-10-30 | Payer: Medicaid Other | Source: Ambulatory Visit

## 2020-10-30 ENCOUNTER — Other Ambulatory Visit (HOSPITAL_COMMUNITY)
Admission: RE | Admit: 2020-10-30 | Discharge: 2020-10-30 | Disposition: A | Discharge status: Home / Self Care | Payer: Medicaid Other | Source: Ambulatory Visit | Attending: Obstetrics and Gynecology | Admitting: Obstetrics and Gynecology

## 2020-10-30 ENCOUNTER — Ambulatory Visit
Admission: RE | Admit: 2020-10-30 | Discharge: 2020-10-30 | Disposition: A | Discharge status: Home / Self Care | Payer: Medicaid Other | Source: Ambulatory Visit | Attending: Obstetrics and Gynecology | Admitting: Obstetrics and Gynecology

## 2020-10-30 ENCOUNTER — Other Ambulatory Visit: Payer: Self-pay

## 2020-10-30 VITALS — BP 100/60 | Ht 63.0 in | Wt 127.0 lb

## 2020-10-30 DIAGNOSIS — Z113 Encounter for screening for infections with a predominantly sexual mode of transmission: Secondary | ICD-10-CM | POA: Insufficient documentation

## 2020-10-30 DIAGNOSIS — Z1151 Encounter for screening for human papillomavirus (HPV): Secondary | ICD-10-CM | POA: Insufficient documentation

## 2020-10-30 DIAGNOSIS — R102 Pelvic and perineal pain: Secondary | ICD-10-CM

## 2020-10-30 DIAGNOSIS — Z124 Encounter for screening for malignant neoplasm of cervix: Secondary | ICD-10-CM | POA: Diagnosis not present

## 2020-10-30 DIAGNOSIS — R399 Unspecified symptoms and signs involving the genitourinary system: Secondary | ICD-10-CM

## 2020-10-30 LAB — POCT URINALYSIS DIPSTICK
Bilirubin, UA: NEGATIVE
Glucose, UA: NEGATIVE
Ketones, UA: NEGATIVE
Leukocytes, UA: NEGATIVE
Nitrite, UA: NEGATIVE
Protein, UA: NEGATIVE
Spec Grav, UA: 1.025 (ref 1.010–1.025)
pH, UA: 5 (ref 5.0–8.0)

## 2020-10-30 LAB — POCT URINE PREGNANCY: Preg Test, Ur: NEGATIVE

## 2020-10-30 MED ORDER — DOXYCYCLINE HYCLATE 100 MG PO CAPS: 100.0000 mg | ORAL_CAPSULE | Freq: Two times a day (BID) | ORAL | 0 refills | Status: AC

## 2020-10-30 MED ORDER — ACETAMINOPHEN-CODEINE #3 300-30 MG PO TABS: 1.0000 | ORAL_TABLET | ORAL | 0 refills | Status: DC | PRN

## 2020-10-30 MED ORDER — METRONIDAZOLE 500 MG PO TABS: 500.0000 mg | ORAL_TABLET | Freq: Two times a day (BID) | ORAL | 0 refills | Status: AC

## 2020-10-30 MED ORDER — CEFTRIAXONE SODIUM 500 MG IJ SOLR
500.0000 mg | Freq: Once | INTRAMUSCULAR | Status: AC
  Administered 2020-11-05: 500 mg via INTRAMUSCULAR

## 2020-10-30 NOTE — Patient Instructions (Signed)
I value your feedback and you entrusting us with your care. If you get a [facility] patient survey, I would appreciate you taking the time to let us know about your experience today. Thank you! ? ? ?

## 2020-10-31 ENCOUNTER — Ambulatory Visit: Payer: Medicaid Other

## 2020-11-01 ENCOUNTER — Telehealth: Payer: Self-pay | Admitting: Obstetrics and Gynecology

## 2020-11-01 NOTE — Telephone Encounter (Signed)
Called the pt to set up a nurse visit for her rocephin injection.  There was no answer.  I did leave a message.  I also sent her a MyChart message.

## 2020-11-02 ENCOUNTER — Other Ambulatory Visit: Payer: Self-pay | Admitting: Obstetrics and Gynecology

## 2020-11-02 LAB — URINE CULTURE

## 2020-11-02 MED ORDER — NITROFURANTOIN MONOHYD MACRO 100 MG PO CAPS: 100.0000 mg | ORAL_CAPSULE | Freq: Two times a day (BID) | ORAL | 0 refills | Status: DC

## 2020-11-02 NOTE — Progress Notes (Signed)
Rx macrobid for UTI

## 2020-11-04 LAB — CYTOLOGY - PAP
Chlamydia: NEGATIVE
Comment: NEGATIVE
Comment: NEGATIVE
Comment: NORMAL
Diagnosis: NEGATIVE
High risk HPV: NEGATIVE
Neisseria Gonorrhea: NEGATIVE

## 2020-11-04 NOTE — Telephone Encounter (Signed)
Patient is scheduled for 11/05/20

## 2020-11-05 ENCOUNTER — Other Ambulatory Visit: Payer: Self-pay

## 2020-11-05 ENCOUNTER — Ambulatory Visit (INDEPENDENT_AMBULATORY_CARE_PROVIDER_SITE_OTHER): Payer: Medicaid Other

## 2020-11-05 DIAGNOSIS — R102 Pelvic and perineal pain: Secondary | ICD-10-CM | POA: Diagnosis not present

## 2020-11-05 MED ORDER — LIDOCAINE HCL (PF) 1 % IJ SOLN
0.9000 mL | Freq: Once | INTRAMUSCULAR | Status: AC
  Administered 2020-11-05: 0.9 mL

## 2020-11-05 NOTE — Progress Notes (Signed)
Pt here for rocephin 583m  inj which was given IM left dorsoglut after reconstituting with 1% xylocaine 0.965m Pt was in a lot of pain upon arrival; was limping a little while leaving.

## 2020-11-20 ENCOUNTER — Ambulatory Visit: Payer: Medicaid Other | Admitting: Obstetrics and Gynecology

## 2020-11-20 ENCOUNTER — Telehealth: Payer: Self-pay

## 2020-11-20 ENCOUNTER — Other Ambulatory Visit: Payer: Self-pay | Admitting: Obstetrics and Gynecology

## 2020-11-20 DIAGNOSIS — G8929 Other chronic pain: Secondary | ICD-10-CM

## 2020-11-20 DIAGNOSIS — N3000 Acute cystitis without hematuria: Secondary | ICD-10-CM

## 2020-11-20 MED ORDER — FLUCONAZOLE 150 MG PO TABS: 150.0000 mg | ORAL_TABLET | Freq: Once | ORAL | 0 refills | Status: AC

## 2020-11-20 MED ORDER — NITROFURANTOIN MONOHYD MACRO 100 MG PO CAPS: 100.0000 mg | ORAL_CAPSULE | Freq: Two times a day (BID) | ORAL | 0 refills | Status: AC

## 2020-11-20 NOTE — Telephone Encounter (Signed)
Pt seen 10/30/20 for RLQ and pubic pelvic pain. Had neg Gyn u/s for pain. Given exam findings, treated for PID with rocephin, flagyl and doxy. Pt couldn't tolerate flagyl and threw a lot of it up.  C&S then showed E. Coli and pt treated with macrobid. Took macrobid with flagyl that caused vomiting, so pt unsure how much she ingested. Now having bilat LBP. Rx RF eRxd today. . Pt having vaginal pains and itching. Given recent abx use, Rx diflucan eRxd.  Pt states overall RLQ pain not improved and can barely get out of bed due to pain. Given neg GYN u/s and persisting sx, pt needs to go to ED to be further eval. May not be GYN at this point.   Pt with AUB since I saw her 10/30/20 off and on this month. Now having bleeding again but it's time for her period. Menses are usually monthly, lasting 7 days, heavy flow for several days with quarter sized clots, and severe dysmen, not improved with tylenol/heating pad (can't have NSAIDs due to Crohns). Also has vomiting due to pain. Wonders if she has endometriosis although no lesions seen on myomectomy surg with DR. Staebler.   Will forward info to Dr. Georgianne Fick for any suggestions. Told pt that CT scan probably next best imaging but it will take a wk or so to get her in if ordered outpt. Given severity of her sx, she should really go to ED. Pt hesitant because she says they'll just send her back here. Explained to her that it may not be GYN issue afterall and they can navigate this for her.

## 2020-11-20 NOTE — Telephone Encounter (Signed)
Patient is still bleeding, still having pain, new symptoms with lower back pain with Headaches. The metroNIDAZOLE was unable to keep down and is having itching. Please advise patient has been rescheduled to 11/28/20 with [facility] due to needing afternoon around 4 pm appointment

## 2020-11-20 NOTE — Progress Notes (Deleted)
[facility]   No chief complaint on file.   HPI:      [name] is a 30 y.o. [id number] whose LMP was No LMP recorded., presents today for pelvic pain f/u from 3 wks ago. Pt was in severe pain, had neg GYN u/s and neg STD testing. Treated for PID with rocephin, doxy and flagyl.   10/30/20 NOTE: presents today for pelvic pain and AUB for 5 days; sx started during sex when she and her partner felt a "pop". Pt started having severe stabbing pain at that time pelvic to RLQ that has persisted and is constant. No relief with tylenol or heating pad.  Has been bleeding since then, heavy flow with large clots. No recent UPT. No vag sx, GI sx. Has had urinary frequency, dysuria and urgency for the past 5 days. Hx of ovar cysts and myomectomy 2/21.   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   Bacterial vaginosis August 2016   recurrent BV   CD (Crohn's disease) ([place]) 07/30/2014   Crohn's disease ([place]) Jan 2016   Depression    Miscarriage 07/18/2014    Past Surgical History:  Procedure Laterality Date   DILATION AND CURETTAGE OF UTERUS     DILATION AND EVACUATION N/A 12/02/2016   Procedure: DILATATION AND EVACUATION;  Surgeon: Harlin Heys, MD;  Location: [facility];  Service: Gynecology;  Laterality: N/A;   DILATION AND EVACUATION N/A 06/30/2017   Procedure: DILATATION AND EVACUATION;  Surgeon: Harlin Heys, MD;  Location: [facility];  Service: Gynecology;  Laterality: N/A;   DILATION AND EVACUATION N/A 12/06/2018   Procedure: SUCTION D&C;  Surgeon: [name], MD;  Location: [facility];  Service: Gynecology;  Laterality: N/A;   MYOMECTOMY N/A 04/06/2019   Procedure: MYOMECTOMY VIA LAPAROTOMY;  Surgeon: [name], MD;  Location: [facility];  Service: Gynecology;  Laterality: N/A;   TONSILLECTOMY      Family History  Problem Relation Age of Onset   Healthy Mother    Healthy Father    Kidney disease Maternal Uncle     Heart failure Maternal Uncle    Thyroid disease Paternal Aunt    Heart failure Maternal Grandmother    Hypertension Paternal Grandmother    Cancer Paternal Grandmother    Cancer Paternal Grandfather    Mental illness Neg Hx     Social History   Socioeconomic History   Marital status: Married    Spouse name: Not on file   Number of children: Not on file   Years of education: Not on file   Highest education level: Not on file  Occupational History   Occupation: unemployed  Tobacco Use   Smoking status: Every Day    Types: Cigars   Smokeless tobacco: Never   Tobacco comments:    black and mild 2-3 cigars a day  Vaping Use   Vaping Use: Never used  Substance and Sexual Activity   Alcohol use: Yes    Alcohol/week: 2.0 standard drinks    Types: 2 Cans of beer per week    Comment: 2x/mo   Drug use: Not Currently    Types: Marijuana    Comment: last use age 55   Sexual activity: Yes    Partners: Male    Birth control/protection: None  Other Topics Concern   Not on file  Social History Narrative   Not on file   Social Determinants of Radio broadcast assistant  Strain: Not on file  Food Insecurity: Not on file  Transportation Needs: Not on file  Physical Activity: Not on file  Stress: Not on file  Social Connections: Not on file  Intimate Partner Violence: Not on file    Outpatient Medications Prior to Visit  Medication Sig Dispense Refill   acetaminophen-codeine (TYLENOL #3) 300-30 MG tablet Take 1 tablet by mouth every 4 (four) hours as needed for moderate pain. 12 tablet 0   No facility-administered medications prior to visit.      ROS:  Review of Systems BREAST: No symptoms   OBJECTIVE:   Vitals:  There were no vitals taken for this visit.  Physical Exam  Results: No results found for this or any previous visit (from the past 24 hour(s)).   Assessment/Plan: No diagnosis found.    No orders of the defined types were placed in this  encounter.     No follow-ups on file.  Tajee Savant B. Bryony Kaman, PA-C 11/20/2020 10:27 AM

## 2020-11-20 NOTE — Progress Notes (Signed)
Rx macrobid for UTI. Pt had E. Coli on 10/30/20 C&S and given macrobid but threw a lot of it up because took with the flagyl for PID that caused vomiting. Pt unsure how much she ingested. Now having LBP bilat. Will retreat.

## 2020-11-21 NOTE — Telephone Encounter (Signed)
Called pt, no answer, LVMTRC. 

## 2020-11-21 NOTE — Telephone Encounter (Signed)
No ER would be my recommendation she has chronic pain before the only thing we saw was the fibroids so hence the myomectomy.  It may be her chronic pain but I agree with ER evaluation if it is that signficiant

## 2020-11-21 NOTE — Telephone Encounter (Signed)
Patient is returning missed call. Please advise 

## 2020-11-22 NOTE — Telephone Encounter (Signed)
Called pt and adv of [facility]'s recommendation to go to the ED.  She reiterates the she doesn't have the money to keep going to the ED.  Adv at this point it's probably not GYN related; pt states she has been to GI and they say it's not GI related.  Pt states no one is listening to her.  It mostly happens with her period.  Pt feels like she is getting ping ponged around and is wondering if she should fine another doctor.  Adv pt I would write this up and send it back to [facility].

## 2020-11-24 NOTE — Telephone Encounter (Signed)
Not  sure what to do with this pt. Pls advise.

## 2020-11-25 NOTE — Addendum Note (Signed)
Addended by: Ardeth Perfect B on: 09/23/6836 02:49 PM   Modules accepted: Orders

## 2020-11-25 NOTE — Telephone Encounter (Signed)
LM for pt that we are referring her to [facility] pelvic pain clinic. Pt has had neg CT and Gyn u/s, as well as neg GYN surg for leio; Nothing more for [place] to do at this time. [facility] will call her with appt info.

## 2020-11-25 NOTE — Telephone Encounter (Signed)
The only other option would be for her to go to the pelvic pain clinic at [facility]

## 2020-11-28 ENCOUNTER — Ambulatory Visit: Payer: Medicaid Other | Admitting: Obstetrics and Gynecology

## 2021-02-18 NOTE — Progress Notes (Signed)
error 

## 2021-09-08 ENCOUNTER — Ambulatory Visit
Admission: EM | Admit: 2021-09-08 | Discharge: 2021-09-08 | Disposition: A | Discharge status: Home / Self Care | Payer: Medicaid Other | Attending: Emergency Medicine | Admitting: Emergency Medicine

## 2021-09-08 ENCOUNTER — Encounter: Payer: Self-pay | Admitting: Emergency Medicine

## 2021-09-08 DIAGNOSIS — M79642 Pain in left hand: Secondary | ICD-10-CM

## 2021-09-08 DIAGNOSIS — M79641 Pain in right hand: Secondary | ICD-10-CM | POA: Diagnosis not present

## 2021-09-08 MED ORDER — PREDNISONE 20 MG PO TABS: 40.0000 mg | ORAL_TABLET | Freq: Every day | ORAL | 0 refills | Status: DC

## 2021-09-08 MED ORDER — KETOROLAC TROMETHAMINE 30 MG/ML IJ SOLN
30.0000 mg | Freq: Once | INTRAMUSCULAR | Status: AC
  Administered 2021-09-08: 30 mg via INTRAMUSCULAR

## 2021-09-08 NOTE — ED Provider Notes (Signed)
Roderic Palau    CSN: [id number] Arrival date & time: 09/08/21  1808      History   Chief Complaint Chief Complaint  Patient presents with   Hand Pain    HPI Kathleen Owen is a 31 y.o. female.   Patient presents with bilateral hand and finger pain occurring daily for 17 days.  Pain has been intermittent throughout the day, worsen on the days that she works.  Range of motion was intact.  Endorses increased pain and stiffness upon awakening in the morning and endorses that her fingers are almost curled closed.  Pain improves with movement.  Intermittent tingling sensation.  Denies injury or trauma.  Has attempted use of Tylenol arthritis, ice and IcyHot which have been minimally effective.  Patient works as a Therapist, music.   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   CD (Crohn's disease) ([place]) 07/30/2014   Body mass index (BMI) of 27.0-27.9 in adult 10/03/2013    Past Surgical History:   Procedure Laterality Date   DILATION AND CURETTAGE OF UTERUS     DILATION AND EVACUATION N/A 12/02/2016   Procedure: DILATATION AND EVACUATION;  Surgeon: Harlin Heys, MD;  Location: [facility];  Service: Gynecology;  Laterality: N/A;   DILATION AND EVACUATION N/A 06/30/2017   Procedure: DILATATION AND EVACUATION;  Surgeon: Harlin Heys, MD;  Location: [facility];  Service: Gynecology;  Laterality: N/A;   DILATION AND EVACUATION N/A 12/06/2018   Procedure: SUCTION D&C;  Surgeon: [name], MD;  Location: [facility];  Service: Gynecology;  Laterality: N/A;   MYOMECTOMY N/A 04/06/2019   Procedure: MYOMECTOMY VIA LAPAROTOMY;  Surgeon: [name], MD;  Location: [facility];  Service: Gynecology;  Laterality: N/A;   TONSILLECTOMY      OB History     Gravida  18   Para  0   Term      Preterm      AB  16   Living  0      SAB  14   IAB  1   Ectopic      Multiple      Live Births  0            Home Medications    Prior to Admission medications   Medication Sig Start Date End Date Taking? Authorizing Provider  predniSONE (DELTASONE) 20 MG tablet Take 2 tablets (40 mg total) by mouth daily. 09/08/21  Yes Ilya Ess R, NP  acetaminophen-codeine (TYLENOL #3) 300-30 MG tablet Take 1 tablet by  mouth every 4 (four) hours as needed for moderate pain. 05/28/79   Copland, Deirdre Evener, PA-C    Family History Family History  Problem Relation Age of Onset   Healthy Mother    Healthy Father    Kidney disease Maternal Uncle    Heart failure Maternal Uncle    Thyroid disease Paternal Aunt    Heart failure Maternal Grandmother    Hypertension Paternal Grandmother    Cancer Paternal Grandmother    Cancer Paternal Grandfather    Mental illness Neg Hx     Social History Social History   Tobacco Use   Smoking status: Former    Types: Cigars   Smokeless tobacco: Never   Tobacco comments:    black and mild 2-3 cigars a day  Vaping Use   Vaping Use: Every  day  Substance Use Topics   Alcohol use: Yes    Alcohol/week: 2.0 standard drinks of alcohol    Types: 2 Cans of beer per week    Comment: 2x/mo   Drug use: Not Currently    Types: Marijuana    Comment: last use age 41     Allergies   Aspirin, Ibuprofen, and Amoxicillin   Review of Systems Review of Systems  Constitutional: Negative.   Respiratory: Negative.    Cardiovascular: Negative.   Musculoskeletal:  Positive for arthralgias and myalgias. Negative for back pain, gait problem, joint swelling, neck pain and neck stiffness.  Skin: Negative.      Physical Exam Triage Vital Signs ED Triage Vitals  Enc Vitals Group     BP 09/08/21 1827 106/68     Pulse Rate 09/08/21 1827 81     Resp 09/08/21 1827 16     Temp 09/08/21 1827 98.9 F (37.2 C)     Temp Source 09/08/21 1827 Oral     SpO2 09/08/21 1827 98 %     Weight --      Height --      Head Circumference --      Peak Flow --      Pain Score 09/08/21 1826 5     Pain Loc --      Pain Edu? --      Excl. in [place]? --    No data found.  Updated Vital Signs BP 106/68 (BP Location: Left Arm)   Pulse 81   Temp 98.9 F (37.2 C) (Oral)   Resp 16   LMP 08/09/2021   SpO2 98%   Visual Acuity Right Eye Distance:   Left Eye Distance:   Bilateral Distance:    Right Eye Near:   Left Eye Near:    Bilateral Near:     Physical Exam Constitutional:      Appearance: Normal appearance.  HENT:     Head: Normocephalic.  Eyes:     Extraocular Movements: Extraocular movements intact.  Pulmonary:     Effort: Pulmonary effort is normal.  Musculoskeletal:     Comments: Tenderness is present along the base of all 10 metacarpals, no ecchymosis, swelling or deformity present, range of motion is intact, sensation is intact, 2+ radial pulses bilaterally  Skin:    General: Skin is warm and dry.  Neurological:     Mental Status: She is alert and oriented to person, place, and time. Mental status is at baseline.  Psychiatric:         Mood and Affect: Mood normal.        Behavior: Behavior normal.  UC Treatments / Results  Labs (all labs ordered are listed, but only abnormal results are displayed) Labs Reviewed - No data to display  EKG   Radiology No results found.  Procedures Procedures (including critical care time)  Medications Ordered in UC Medications  ketorolac (TORADOL) 30 MG/ML injection 30 mg (has no administration in time range)    Initial Impression / Assessment and Plan / UC Course  I have reviewed the triage vital signs and the nursing notes.  Pertinent labs & imaging results that were available during my care of the patient were reviewed by me and considered in my medical decision making (see chart for details).  Bilateral hand pain  Etiology is most likely irritation to the joints due to overuse, symptomology is consistent with signs of osteoarthritis, discussed with patient, Toradol injection given in office and recommended use of washcloth and hand to keep open overnight as we do not have an splints available in this urgent care, prednisone 40 mg burst prescribed and recommended diclofenac gel to be used prophylactically on days that she works to help prevent symptoms, given written handout of hand exercises to help with pain management, recommended use of heat and discontinuation of ice, given walker referral to orthopedics if symptoms persist or worsen Final Clinical Impressions(s) / UC Diagnoses   Final diagnoses:  Bilateral hand pain     Discharge Instructions      Your pain is most likely being caused by irritation to the ligaments, tendons and joints due to overuse.  You have been given an injection of Toradol here today in the office to help reduce the inflammatory process that occurs the body attempts to heal itself  Starting tomorrow take prednisone every morning with food for the next 5 days to continue to process above, you may use Tylenol 500 to 1000 mg every 6  hours in addition while using the steroid  Once complete you may use diclofenac (Voltaren) gel on the days that you are working to help minimize your discomfort and as a prophylactic treatment  You may use heating pad in 15 minute intervals as needed for additional comfort,  Begin stretching affected area daily for 10 minutes as tolerated to further loosen muscles   As your hands have been curling up while you are sleeping you may follow-up with a washcloth in place inside the hand to prevent it from closing, there are also splints that you may purchase to help aid in this that we unfortunately do not have this urgent care   If pain persist after recommended treatment or reoccurs if may be beneficial to follow up with orthopedic specialist for evaluation, this doctor specializes in the bones and can manage your symptoms long-term with options such as but not limited to imaging, medications or physical therapy      ED Prescriptions     Medication Sig Dispense Auth. Provider   predniSONE (DELTASONE) 20 MG tablet Take 2 tablets (40 mg total) by mouth daily. 10 tablet Hans Eden, NP      PDMP not reviewed this encounter.   Hans Eden, NP 09/08/21 [phone]

## 2021-09-08 NOTE — ED Triage Notes (Signed)
Pt presents with bilateral hand cramping x 2 weeks. 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

## 2021-09-08 NOTE — Discharge Instructions (Addendum)
Your pain is most likely being caused by irritation to the ligaments, tendons and joints due to overuse.  You have been given an injection of Toradol here today in the office to help reduce the inflammatory process that occurs the body attempts to heal itself  Starting tomorrow take prednisone every morning with food for the next 5 days to continue to process above, you may use Tylenol 500 to 1000 mg every 6 hours in addition while using the steroid  Once complete you may use diclofenac (Voltaren) gel on the days that you are working to help minimize your discomfort and as a prophylactic treatment  You may use heating pad in 15 minute intervals as needed for additional comfort,  Begin stretching affected area daily for 10 minutes as tolerated to further loosen muscles   As your hands have been curling up while you are sleeping you may follow-up with a washcloth in place inside the hand to prevent it from closing, there are also splints that you may purchase to help aid in this that we unfortunately do not have this urgent care   If pain persist after recommended treatment or reoccurs if may be beneficial to follow up with orthopedic specialist for evaluation, this doctor specializes in the bones and can manage your symptoms long-term with options such as but not limited to imaging, medications or physical therapy

## 2022-01-15 ENCOUNTER — Emergency Department
Admission: EM | Admit: 2022-01-15 | Discharge: 2022-01-15 | Discharge status: Against Medical Advice | Payer: Medicaid Other | Attending: Emergency Medicine | Admitting: Emergency Medicine

## 2022-01-15 ENCOUNTER — Encounter: Payer: Self-pay | Admitting: Emergency Medicine

## 2022-01-15 DIAGNOSIS — N939 Abnormal uterine and vaginal bleeding, unspecified: Secondary | ICD-10-CM | POA: Insufficient documentation

## 2022-01-15 DIAGNOSIS — M545 Low back pain, unspecified: Secondary | ICD-10-CM | POA: Diagnosis present

## 2022-01-15 DIAGNOSIS — Z5321 Procedure and treatment not carried out due to patient leaving prior to being seen by health care provider: Secondary | ICD-10-CM | POA: Insufficient documentation

## 2022-01-15 LAB — CBC
HCT: 39.7 % (ref 36.0–46.0)
Hemoglobin: 13.5 g/dL (ref 12.0–15.0)
MCH: 30.8 pg (ref 26.0–34.0)
MCHC: 34 g/dL (ref 30.0–36.0)
MCV: 90.6 fL (ref 80.0–100.0)
Platelets: 252 10*3/uL (ref 150–400)
RBC: 4.38 MIL/uL (ref 3.87–5.11)
RDW: 13.6 % (ref 11.5–15.5)
WBC: 19.1 10*3/uL — ABNORMAL HIGH (ref 4.0–10.5)
nRBC: 0 % (ref 0.0–0.2)

## 2022-01-15 LAB — HCG, QUANTITATIVE, PREGNANCY: hCG, Beta Chain, Quant, S: <1 m[IU]/mL (ref <5)

## 2022-01-15 NOTE — ED Triage Notes (Addendum)
Pt reports LMP on 12/12/21, positive home pregnancy test on 01/11/22, lower back pain and scant vaginal bleeding since 01/12/22. Pt reports taking her friends 5-354m Percocet prior to arrival.  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

## 2022-07-25 HISTORY — PX: OVARIAN CYST SURGERY: SHX726

## 2023-01-09 ENCOUNTER — Emergency Department: Payer: Medicaid Other

## 2023-01-09 ENCOUNTER — Other Ambulatory Visit: Payer: Self-pay

## 2023-01-09 ENCOUNTER — Inpatient Hospital Stay
Admission: EM | Admit: 2023-01-09 | Discharge: 2023-01-13 | DRG: 372 | Disposition: A | Discharge status: Home / Self Care | Payer: Medicaid Other | Attending: Internal Medicine | Admitting: Internal Medicine

## 2023-01-09 DIAGNOSIS — Z880 Allergy status to penicillin: Secondary | ICD-10-CM

## 2023-01-09 DIAGNOSIS — Z87891 Personal history of nicotine dependence: Secondary | ICD-10-CM

## 2023-01-09 DIAGNOSIS — F32A Depression, unspecified: Secondary | ICD-10-CM | POA: Diagnosis present

## 2023-01-09 DIAGNOSIS — N760 Acute vaginitis: Secondary | ICD-10-CM | POA: Diagnosis present

## 2023-01-09 DIAGNOSIS — A0471 Enterocolitis due to Clostridium difficile, recurrent: Principal | ICD-10-CM | POA: Diagnosis present

## 2023-01-09 DIAGNOSIS — Z8249 Family history of ischemic heart disease and other diseases of the circulatory system: Secondary | ICD-10-CM

## 2023-01-09 DIAGNOSIS — A0472 Enterocolitis due to Clostridium difficile, not specified as recurrent: Secondary | ICD-10-CM | POA: Diagnosis not present

## 2023-01-09 DIAGNOSIS — K5090 Crohn's disease, unspecified, without complications: Secondary | ICD-10-CM | POA: Diagnosis present

## 2023-01-09 DIAGNOSIS — R5383 Other fatigue: Secondary | ICD-10-CM | POA: Diagnosis present

## 2023-01-09 DIAGNOSIS — Z886 Allergy status to analgesic agent status: Secondary | ICD-10-CM

## 2023-01-09 LAB — COMPREHENSIVE METABOLIC PANEL
ALT: 23 U/L (ref 0–44)
AST: 21 U/L (ref 15–41)
Albumin: 4.4 g/dL (ref 3.5–5.0)
Alkaline Phosphatase: 46 U/L (ref 38–126)
Anion gap: 8 (ref 5–15)
BUN: 9 mg/dL (ref 6–20)
CO2: 25 mmol/L (ref 22–32)
Calcium: 8.8 mg/dL — ABNORMAL LOW (ref 8.9–10.3)
Chloride: 101 mmol/L (ref 98–111)
Creatinine, Ser: 0.63 mg/dL (ref 0.44–1.00)
GFR, Estimated: >60 mL/min (ref >60)
Glucose, Bld: 103 mg/dL — ABNORMAL HIGH (ref 70–99)
Potassium: 3.2 mmol/L — ABNORMAL LOW (ref 3.5–5.1)
Sodium: 134 mmol/L — ABNORMAL LOW (ref 135–145)
Total Bilirubin: 0.7 mg/dL (ref <1.2)
Total Protein: 7.9 g/dL (ref 6.5–8.1)

## 2023-01-09 LAB — CBC
HCT: 43.7 % (ref 36.0–46.0)
Hemoglobin: 15 g/dL (ref 12.0–15.0)
MCH: 31.2 pg (ref 26.0–34.0)
MCHC: 34.3 g/dL (ref 30.0–36.0)
MCV: 90.9 fL (ref 80.0–100.0)
Platelets: 266 10*3/uL (ref 150–400)
RBC: 4.81 MIL/uL (ref 3.87–5.11)
RDW: 13 % (ref 11.5–15.5)
WBC: 12.2 10*3/uL — ABNORMAL HIGH (ref 4.0–10.5)
nRBC: 0 % (ref 0.0–0.2)

## 2023-01-09 LAB — URINALYSIS, ROUTINE W REFLEX MICROSCOPIC
Bilirubin Urine: NEGATIVE
Glucose, UA: NEGATIVE mg/dL
Hgb urine dipstick: NEGATIVE
Ketones, ur: NEGATIVE mg/dL
Leukocytes,Ua: NEGATIVE
Nitrite: NEGATIVE
Protein, ur: NEGATIVE mg/dL
Specific Gravity, Urine: 1.029 (ref 1.005–1.030)
pH: 5 (ref 5.0–8.0)

## 2023-01-09 LAB — C DIFFICILE QUICK SCREEN W PCR REFLEX
C Diff antigen: POSITIVE — AB
C Diff interpretation: DETECTED
C Diff toxin: POSITIVE — AB

## 2023-01-09 LAB — POC URINE PREG, ED: Preg Test, Ur: NEGATIVE

## 2023-01-09 LAB — LIPASE, BLOOD: Lipase: 31 U/L (ref 11–51)

## 2023-01-09 MED ORDER — IOHEXOL 300 MG/ML  SOLN
100.0000 mL | Freq: Once | INTRAMUSCULAR | Status: AC | PRN
  Administered 2023-01-09: 100 mL via INTRAVENOUS

## 2023-01-09 MED ORDER — ACETAMINOPHEN 325 MG PO TABS
650.0000 mg | ORAL_TABLET | Freq: Four times a day (QID) | ORAL | Status: DC | PRN
Start: 2023-01-09 — End: 2023-01-13
  Administered 2023-01-12: 650 mg via ORAL
  Filled 2023-01-09: qty 2

## 2023-01-09 MED ORDER — OXYCODONE HCL 5 MG PO TABS
5.0000 mg | ORAL_TABLET | ORAL | Status: DC | PRN
Start: 2023-01-09 — End: 2023-01-13
  Administered 2023-01-10 – 2023-01-12 (×3): 5 mg via ORAL
  Filled 2023-01-09 (×4): qty 1

## 2023-01-09 MED ORDER — ONDANSETRON HCL 4 MG/2ML IJ SOLN
4.0000 mg | Freq: Four times a day (QID) | INTRAMUSCULAR | Status: DC | PRN
  Administered 2023-01-09 – 2023-01-13 (×13): 4 mg via INTRAVENOUS
  Filled 2023-01-09 (×13): qty 2

## 2023-01-09 MED ORDER — HYDRALAZINE HCL 20 MG/ML IJ SOLN: 5.0000 mg | Freq: Four times a day (QID) | INTRAMUSCULAR | Status: DC | PRN

## 2023-01-09 MED ORDER — VANCOMYCIN HCL 125 MG PO CAPS: 125.0000 mg | ORAL_CAPSULE | Freq: Once | ORAL | Status: DC

## 2023-01-09 MED ORDER — SODIUM CHLORIDE 0.9 % IV BOLUS
1000.0000 mL | Freq: Once | INTRAVENOUS | Status: AC
  Administered 2023-01-09: 1000 mL via INTRAVENOUS

## 2023-01-09 MED ORDER — FENTANYL CITRATE PF 50 MCG/ML IJ SOSY: 50.0000 ug | PREFILLED_SYRINGE | INTRAMUSCULAR | Status: DC | PRN

## 2023-01-09 MED ORDER — OXYCODONE-ACETAMINOPHEN 5-325 MG PO TABS: 1.0000 | ORAL_TABLET | Freq: Once | ORAL | Status: DC

## 2023-01-09 MED ORDER — ONDANSETRON HCL 4 MG PO TABS: 4.0000 mg | ORAL_TABLET | Freq: Four times a day (QID) | ORAL | Status: DC | PRN

## 2023-01-09 MED ORDER — VANCOMYCIN HCL 125 MG PO CAPS: 125.0000 mg | ORAL_CAPSULE | Freq: Every day | ORAL | Status: DC

## 2023-01-09 MED ORDER — FAMOTIDINE IN NACL 20-0.9 MG/50ML-% IV SOLN
20.0000 mg | Freq: Two times a day (BID) | INTRAVENOUS | Status: DC
  Administered 2023-01-09 – 2023-01-11 (×4): 20 mg via INTRAVENOUS
  Filled 2023-01-09 (×4): qty 50

## 2023-01-09 MED ORDER — ONDANSETRON HCL 4 MG/2ML IJ SOLN
4.0000 mg | Freq: Once | INTRAMUSCULAR | Status: AC
  Administered 2023-01-09: 4 mg via INTRAVENOUS
  Filled 2023-01-09: qty 2

## 2023-01-09 MED ORDER — MORPHINE SULFATE (PF) 2 MG/ML IV SOLN
2.0000 mg | INTRAVENOUS | Status: DC | PRN
Start: 2023-01-09 — End: 2023-01-13
  Administered 2023-01-09 – 2023-01-13 (×21): 2 mg via INTRAVENOUS
  Filled 2023-01-09 (×21): qty 1

## 2023-01-09 MED ORDER — VANCOMYCIN HCL 125 MG PO CAPS
125.0000 mg | ORAL_CAPSULE | Freq: Four times a day (QID) | ORAL | Status: DC
  Administered 2023-01-09 (×2): 125 mg via ORAL
  Filled 2023-01-09 (×5): qty 1

## 2023-01-09 MED ORDER — ALUM & MAG HYDROXIDE-SIMETH 200-200-20 MG/5ML PO SUSP: 30.0000 mL | ORAL | Status: DC | PRN

## 2023-01-09 MED ORDER — VANCOMYCIN HCL 125 MG PO CAPS: 125.0000 mg | ORAL_CAPSULE | Freq: Two times a day (BID) | ORAL | Status: DC

## 2023-01-09 MED ORDER — VANCOMYCIN HCL 125 MG PO CAPS: 125.0000 mg | ORAL_CAPSULE | ORAL | Status: DC

## 2023-01-09 MED ORDER — LACTATED RINGERS IV SOLN
INTRAVENOUS | Status: DC
Start: 2023-01-09 — End: 2023-01-10

## 2023-01-09 MED ORDER — ALBUTEROL SULFATE (2.5 MG/3ML) 0.083% IN NEBU
2.5000 mg | INHALATION_SOLUTION | RESPIRATORY_TRACT | Status: DC | PRN
Start: 2023-01-09 — End: 2023-01-13

## 2023-01-09 MED ORDER — MORPHINE SULFATE (PF) 4 MG/ML IV SOLN
4.0000 mg | INTRAVENOUS | Status: DC | PRN
  Administered 2023-01-09: 4 mg via INTRAVENOUS
  Filled 2023-01-09: qty 1

## 2023-01-09 MED ORDER — ENOXAPARIN SODIUM 40 MG/0.4ML IJ SOSY
40.0000 mg | PREFILLED_SYRINGE | INTRAMUSCULAR | Status: DC
  Administered 2023-01-09 – 2023-01-12 (×4): 40 mg via SUBCUTANEOUS
  Filled 2023-01-09 (×5): qty 0.4

## 2023-01-09 MED ORDER — ACETAMINOPHEN 650 MG RE SUPP: 650.0000 mg | Freq: Four times a day (QID) | RECTAL | Status: DC | PRN

## 2023-01-09 NOTE — ED Provider Notes (Signed)
[facility] [place] Provider Note    Event Date/Time   First MD Initiated Contact with Patient 01/09/23 1423     (approximate)   History   Emesis   HPI  Kathleen Owen is a 32 y.o. female with a history of C. difficile 70 years ago who presents to the ER for evaluation nausea vomiting diarrhea feels like she has C. difficile again.  Was recently on antibiotics for BV.  Having trouble keeping anything down over the past few days.  States the pain is generalized.  No localized tenderness to palpation or discomfort.  Multiple episodes of watery foul-smelling diarrhea nonbloody.     Physical Exam   Triage Vital Signs: ED Triage Vitals  Encounter Vitals Group     BP 01/09/23 1340 110/80     Systolic BP Percentile --      Diastolic BP Percentile --      Pulse Rate 01/09/23 1340 85     Resp 01/09/23 1340 16     Temp 01/09/23 1340 98.5 F (36.9 C)     Temp Source 01/09/23 1340 Oral     SpO2 01/09/23 1340 96 %     Weight 01/09/23 1338 125 lb (56.7 kg)     Height 01/09/23 1338 5\' 3"  (1.6 m)     Head Circumference --      Peak Flow --      Pain Score 01/09/23 1338 7     Pain Loc --      Pain Education --      Exclude from Growth Chart --     Most recent vital signs: Vitals:   01/09/23 1340 01/09/23 1746  BP: 110/80   Pulse: 85   Resp: 16   Temp: 98.5 F (36.9 C) 98 F (36.7 C)  SpO2: 96%      Constitutional: Alert  Eyes: Conjunctivae are normal.  Head: Atraumatic. Nose: No congestion/rhinnorhea. Mouth/Throat: Mucous membranes are moist.   Neck: Painless ROM.  Cardiovascular:   Good peripheral circulation. Respiratory: Normal respiratory effort.  No retractions.  Gastrointestinal: Soft and nontender.  Musculoskeletal:  no deformity Neurologic:  MAE spontaneously. No gross focal neurologic deficits are appreciated.  Skin:  Skin is warm, dry and intact. No rash noted. Psychiatric: Mood and affect are normal. Speech and behavior are  normal.    ED Results / Procedures / Treatments   Labs (all labs ordered are listed, but only abnormal results are displayed) Labs Reviewed  C DIFFICILE QUICK SCREEN W PCR REFLEX   - Abnormal; Notable for the following components:      Result Value   C Diff antigen POSITIVE (*)    C Diff toxin POSITIVE (*)    All other components within normal limits  COMPREHENSIVE METABOLIC PANEL - Abnormal; Notable for the following components:   Sodium 134 (*)    Potassium 3.2 (*)    Glucose, Bld 103 (*)    Calcium 8.8 (*)    All other components within normal limits  CBC - Abnormal; Notable for the following components:   WBC 12.2 (*)    All other components within normal limits  URINALYSIS, ROUTINE W REFLEX MICROSCOPIC - Abnormal; Notable for the following components:   Color, Urine AMBER (*)    APPearance CLOUDY (*)    All other components within normal limits  LIPASE, BLOOD  COMPREHENSIVE METABOLIC PANEL  CBC  POC URINE PREG, ED     EKG     RADIOLOGY  PROCEDURES:  Critical Care performed:   Procedures   MEDICATIONS ORDERED IN ED: Medications  vancomycin (VANCOCIN) capsule 125 mg (125 mg Oral Given 01/09/23 1735)    Followed by  vancomycin (VANCOCIN) capsule 125 mg (has no administration in time range)    Followed by  vancomycin (VANCOCIN) capsule 125 mg (has no administration in time range)    Followed by  vancomycin (VANCOCIN) capsule 125 mg (has no administration in time range)    Followed by  vancomycin (VANCOCIN) capsule 125 mg (has no administration in time range)  enoxaparin (LOVENOX) injection 40 mg (has no administration in time range)  lactated ringers infusion ( Intravenous New Bag/Given 01/09/23 1737)  acetaminophen (TYLENOL) tablet 650 mg (has no administration in time range)    Or  acetaminophen (TYLENOL) suppository 650 mg (has no administration in time range)  oxyCODONE (Oxy IR/ROXICODONE) immediate release tablet 5 mg (has no administration in  time range)  morphine (PF) 2 MG/ML injection 2 mg (has no administration in time range)  ondansetron (ZOFRAN) tablet 4 mg (has no administration in time range)    Or  ondansetron (ZOFRAN) injection 4 mg (has no administration in time range)  albuterol (PROVENTIL) (2.5 MG/3ML) 0.083% nebulizer solution 2.5 mg (has no administration in time range)  hydrALAZINE (APRESOLINE) injection 5 mg (has no administration in time range)  famotidine (PEPCID) IVPB 20 mg premix (has no administration in time range)  alum & mag hydroxide-simeth (MAALOX/MYLANTA) 200-200-20 MG/5ML suspension 30 mL (has no administration in time range)  sodium chloride 0.9 % bolus 1,000 mL (0 mLs Intravenous Stopped 01/09/23 1739)  ondansetron (ZOFRAN) injection 4 mg (4 mg Intravenous Given 01/09/23 1453)  iohexol (OMNIPAQUE) 300 MG/ML solution 100 mL (100 mLs Intravenous Contrast Given 01/09/23 1605)     IMPRESSION / MDM / ASSESSMENT AND PLAN / ED COURSE  I reviewed the triage vital signs and the nursing notes.                              Differential diagnosis includes, but is not limited to, C. difficile, enteritis, colitis, diverticulitis, appendicitis, dehydration, viral illness  Patient presenting to the ER for evaluation of symptoms as described above.  Based on symptoms, risk factors and considered above differential, this presenting complaint could reflect a potentially life-threatening illness therefore the patient will be placed on continuous pulse oximetry and telemetry for monitoring.  Laboratory evaluation will be sent to evaluate for the above complaints.  Give IV fluids as well as IV morphine for pain IV antiemetic.  Will check for C. difficile.    Clinical Course as of 01/09/23 1805  Sat Jan 09, 2023  1557 Patient is C. difficile positive.  Went to recheck on patient and she is rocking in pain.  Will order CT imaging to further evaluate. [PR]  1645 CT imaging on my review interpretation without evidence of  obstruction.  Patient still having discomfort.  No sign of toxic megacolon.  Has required hospitalization for C. difficile in the past.  Will consult hospitalist for admission. [PR]    Clinical Course User Index [PR] Willy Eddy, MD     FINAL CLINICAL IMPRESSION(S) / ED DIAGNOSES   Final diagnoses:  C. difficile colitis     Rx / DC Orders   ED Discharge Orders     None        Note:  This document was prepared using Dragon voice recognition software and may  include unintentional dictation errors.    Willy Eddy, MD 01/09/23 [phone]

## 2023-01-09 NOTE — H&P (Signed)
History and Physical    ATLEE MCADOO UJW:[id number] DOB: 13-Jun-1990 DOA: 01/09/2023  PCP: [facility]  Patient coming from: Home  I have personally briefly reviewed patient's old medical records in [facility]  Chief Complaint: Diarrhea, nausea, vomiting  HPI: Kathleen Owen is a 32 y.o. female with medical history significant of recurrent c dificile infections who presents with 1 week of diarrhea associated with abdominal pain and intractable nausea and vomiting and poor p.o. intake.  She was recently prescribed clindamycin for bacterial vaginosis.  Has been unable to keep any solid food down.  States the pain is generalized but worse in the upper abdomen/epigastric area.  Multiple episodes of watery foul-smelling diarrhea.  Nonbloody nonbilious.  On my evaluation patient is resting in bed.  She appears fatigued but otherwise stable.  Vital signs are stable.  Laboratory investigation overall reassuring.  Mild leukocytosis.  CT abdomen pelvis negative for acute issues.  ED Course: C. difficile assay done in the emergency department positive toxin and antigen.  Patient started on oral vancomycin taper given this is her third occurrence.  Pain medication and antinausea medication administered.  Intravenous fluids administered.  Review of Systems: As per HPI otherwise 14 point review of systems negative.    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   Bacterial vaginosis August 2016   recurrent BV   CD (Crohn's disease) (HCC) 07/30/2014   Crohn's disease (HCC) Jan 2016   Depression    Miscarriage 07/18/2014    Past Surgical History:  Procedure Laterality Date   DILATION AND CURETTAGE OF UTERUS     DILATION AND EVACUATION N/A 12/02/2016   Procedure: DILATATION AND EVACUATION;  Surgeon: Linzie Collin, MD;  Location: [facility] ORS;  Service: Gynecology;  Laterality: N/A;   DILATION AND EVACUATION N/A 06/30/2017   Procedure:  DILATATION AND EVACUATION;  Surgeon: Linzie Collin, MD;  Location: [facility] OR[facility];  Service: Gynecology;  Laterality: N/A;   DILATION AND EVACUATION N/A 12/06/2018   Procedure: SUCTION D&C;  Surgeon: Vena Austria, MD;  Location: [facility] OR[facility];  Service: Gynecology;  Laterality: N/A;   MYOMECTOMY N/A 04/06/2019   Procedure: MYOMECTOMY VIA LAPAROTOMY;  Surgeon: Vena Austria, MD;  Location: [facility] ORS;  Service: Gynecology;  Laterality: N/A;   TONSILLECTOMY       reports that she has quit smoking. Her smoking use included cigars. She has never used smokeless tobacco. She reports current alcohol use of about 2.0 standard drinks of alcohol per week. She reports that she does not currently use drugs after having used the following drugs: Marijuana.  Allergies  Allergen Reactions   Aspirin Other (See Comments)    Chron's Disease Chron's Disease Other reaction(s): Other (See Comments) Chron's Disease   Ibuprofen Other (See Comments)    Chron's Disease Chron's Disease Other reaction(s): Other (See Comments), Other (See Comments) Chron's Disease "blow flare" per pt   Amoxicillin Rash and Other (See Comments)    Has patient had a PCN reaction causing immediate rash, facial/tongue/throat swelling, SOB or lightheadedness with hypotension: Unknown Has patient had a PCN reaction causing severe rash involving mucus membranes or skin necrosis: Unknown Has patient had a PCN reaction that required hospitalization: Unknown Has patient had a PCN reaction occurring within the last 10 years: No If all of the above answers are "NO", then may proceed with Cephalosporin use.  Other reaction(s): Other (See Comments) Has patient had a PCN reaction causing immediate rash, facial/tongue/throat swelling, SOB or  lightheadedness with hypotension: Unknown Has patient had a PCN reaction causing severe rash involving mucus membranes or skin necrosis: Unknown Has patient had a PCN reaction that required  hospitalization: Unknown Has patient had a PCN reaction occurring within the last 10 years: No If all of the above answers are "NO", then may proceed with Cephalosporin use.    Family History  Problem Relation Age of Onset   Healthy Mother    Healthy Father    Kidney disease Maternal Uncle    Heart failure Maternal Uncle    Thyroid disease Paternal Aunt    Heart failure Maternal Grandmother    Hypertension Paternal Grandmother    Cancer Paternal Grandmother    Cancer Paternal Grandfather    Mental illness Neg Hx      Prior to Admission medications   Medication Sig Start Date End Date Taking? Authorizing Provider  acetaminophen-codeine (TYLENOL #3) 300-30 MG tablet Take 1 tablet by mouth every 4 (four) hours as needed for moderate pain. 10/30/20   Copland, Ilona Sorrel, PA-C  predniSONE (DELTASONE) 20 MG tablet Take 2 tablets (40 mg total) by mouth daily. 09/08/21   Valinda Hoar, NP    Physical Exam: Vitals:   01/09/23 1338 01/09/23 1340  BP:  110/80  Pulse:  85  Resp:  16  Temp:  98.5 F (36.9 C)  TempSrc:  Oral  SpO2:  96%  Weight: 56.7 kg   Height: 5\' 3"  (1.6 m)     Vitals:   01/09/23 1338 01/09/23 1340  BP:  110/80  Pulse:  85  Resp:  16  Temp:  98.5 F (36.9 C)  TempSrc:  Oral  SpO2:  96%  Weight: 56.7 kg   Height: 5\' 3"  (1.6 m)    General: NAD.  Appears fatigued HEENT: Normocephalic, atraumatic Neck, supple, trachea midline, no tenderness Heart: Regular rate and rhythm, S1/S2 normal, no murmurs Lungs: Lungs clear.  Normal work of breathing.  Room air Abdomen: Soft, nondistended, epigastric tenderness, hyperactive bowel sounds Extremities: Normal, atraumatic, no clubbing or cyanosis, normal muscle tone Skin: No rashes or lesions, normal color Neurologic: Cranial nerves grossly intact, sensation intact, alert and oriented x3 Psychiatric: Normal affect   Labs on Admission: I have personally reviewed following labs and imaging studies  CBC: Recent  Labs  Lab 01/09/23 1344  WBC 12.2*  HGB 15.0  HCT 43.7  MCV 90.9  PLT 266   Basic Metabolic Panel: Recent Labs  Lab 01/09/23 1344  NA 134*  K 3.2*  CL 101  CO2 25  GLUCOSE 103*  BUN 9  CREATININE 0.63  CALCIUM 8.8*   GFR: Estimated Creatinine Clearance: 83.5 mL/min (by C-G formula based on SCr of 0.63 mg/dL). Liver Function Tests: Recent Labs  Lab 01/09/23 1344  AST 21  ALT 23  ALKPHOS 46  BILITOT 0.7  PROT 7.9  ALBUMIN 4.4   Recent Labs  Lab 01/09/23 1344  LIPASE 31   No results for input(s): "AMMONIA" in the last 168 hours. Coagulation Profile: No results for input(s): "INR", "PROTIME" in the last 168 hours. Cardiac Enzymes: No results for input(s): "CKTOTAL", "CKMB", "CKMBINDEX", "TROPONINI" in the last 168 hours. BNP (last 3 results) No results for input(s): "PROBNP" in the last 8760 hours. HbA1C: No results for input(s): "HGBA1C" in the last 72 hours. CBG: No results for input(s): "GLUCAP" in the last 168 hours. Lipid Profile: No results for input(s): "CHOL", "HDL", "LDLCALC", "TRIG", "CHOLHDL", "LDLDIRECT" in the last 72 hours. Thyroid Function Tests:  No results for input(s): "TSH", "T4TOTAL", "FREET4", "T3FREE", "THYROIDAB" in the last 72 hours. Anemia Panel: No results for input(s): "VITAMINB12", "FOLATE", "FERRITIN", "TIBC", "IRON", "RETICCTPCT" in the last 72 hours. Urine analysis:    Component Value Date/Time   COLORURINE AMBER (A) 01/09/2023 1344   APPEARANCEUR CLOUDY (A) 01/09/2023 1344   APPEARANCEUR Hazy 03/23/2014 1437   LABSPEC 1.029 01/09/2023 1344   LABSPEC 1.026 03/23/2014 1437   PHURINE 5.0 01/09/2023 1344   GLUCOSEU NEGATIVE 01/09/2023 1344   GLUCOSEU Negative 03/23/2014 1437   HGBUR NEGATIVE 01/09/2023 1344   BILIRUBINUR NEGATIVE 01/09/2023 1344   BILIRUBINUR neg 10/30/2020 1452   BILIRUBINUR Negative 03/23/2014 1437   KETONESUR NEGATIVE 01/09/2023 1344   PROTEINUR NEGATIVE 01/09/2023 1344   UROBILINOGEN 0.2 10/22/2016  1156   NITRITE NEGATIVE 01/09/2023 1344   LEUKOCYTESUR NEGATIVE 01/09/2023 1344   LEUKOCYTESUR Negative 03/23/2014 1437    Radiological Exams on Admission: CT ABDOMEN PELVIS W CONTRAST  Result Date: 01/09/2023 CLINICAL DATA:  Abdominal pain. EXAM: CT ABDOMEN AND PELVIS WITH CONTRAST TECHNIQUE: Multidetector CT imaging of the abdomen and pelvis was performed using the standard protocol following bolus administration of intravenous contrast. RADIATION DOSE REDUCTION: This exam was performed according to the departmental dose-optimization program which includes automated exposure control, adjustment of the mA and/or kV according to patient size and/or use of iterative reconstruction technique. CONTRAST:  OMNIPAQUE IOHEXOL 300 MG/ML  SOLN COMPARISON:  September 11, 2020 FINDINGS: Lower chest: No acute abnormality. Hepatobiliary: No focal liver abnormality is seen. No gallstones, gallbladder wall thickening, or biliary dilatation. Pancreas: Unremarkable. No pancreatic ductal dilatation or surrounding inflammatory changes. Spleen: Normal in size without focal abnormality. Adrenals/Urinary Tract: Adrenal glands are unremarkable. Kidneys are normal, without renal calculi, focal lesion, or hydronephrosis. Bladder is unremarkable. Stomach/Bowel: Stomach is within normal limits. Appendix appears normal. No evidence of bowel wall thickening, distention, or inflammatory changes. Vascular/Lymphatic: No significant vascular findings are present. No enlarged abdominal or pelvic lymph nodes. Reproductive: Uterus and bilateral adnexa are unremarkable. Other: No abdominal wall hernia or abnormality. No abdominopelvic ascites. Musculoskeletal: No acute or significant osseous findings. IMPRESSION: No CT evidence of acute abdominal/pelvic process. Electronically Signed   By: Ted Mcalpine M.D.   On: 01/09/2023 16:31    EKG: Not done in ED  Assessment/Plan Principal Problem:   C. difficile diarrhea  C. difficile  infection Acute diarrhea Intractable nausea and vomiting Abdominal pain This is patient's third recurrence of C. difficile.  Likely triggered by use of clindamycin. Patient is overall stable CT abdomen pelvis negative for acute colitis Nonbloody diarrhea Plan: Place in observation LR at 125 cc/h x 1 day Multimodal pain control Antiemetics as needed Vancomycin taper Will consider consultation with infectious disease or GI should patient not respond to therapy  History of bacterial vaginosis Recently prescribed clindamycin Currently on hold  History of Crohn's disease May be a contributing factor to the development of recurrent C. Difficile Diagnosis is somewhat in question Patient does not follow with gastroenterology Consider gastroenterology referral on discharge     DVT prophylaxis: SQ lovenox Code Status: Full Family Communication:  Disposition Plan: Return to home environment Consults called: None Admission status: obs, med surg   Tresa Moore MD [facility]   If 7PM-7AM, please contact night-coverage   01/09/2023, 5:02 PM

## 2023-01-09 NOTE — ED Triage Notes (Signed)
Pt to ED emesis, diarrhea for 5 days. Recent travel to [place] Reports hx c diff. Denies fevers. +abd pain

## 2023-01-10 ENCOUNTER — Encounter: Payer: Self-pay | Admitting: Internal Medicine

## 2023-01-10 DIAGNOSIS — Z880 Allergy status to penicillin: Secondary | ICD-10-CM | POA: Diagnosis not present

## 2023-01-10 DIAGNOSIS — Z87891 Personal history of nicotine dependence: Secondary | ICD-10-CM | POA: Diagnosis not present

## 2023-01-10 DIAGNOSIS — K5090 Crohn's disease, unspecified, without complications: Secondary | ICD-10-CM | POA: Diagnosis present

## 2023-01-10 DIAGNOSIS — N760 Acute vaginitis: Secondary | ICD-10-CM | POA: Diagnosis present

## 2023-01-10 DIAGNOSIS — A0472 Enterocolitis due to Clostridium difficile, not specified as recurrent: Secondary | ICD-10-CM | POA: Diagnosis present

## 2023-01-10 DIAGNOSIS — Z8249 Family history of ischemic heart disease and other diseases of the circulatory system: Secondary | ICD-10-CM | POA: Diagnosis not present

## 2023-01-10 DIAGNOSIS — R5383 Other fatigue: Secondary | ICD-10-CM | POA: Diagnosis present

## 2023-01-10 DIAGNOSIS — Z886 Allergy status to analgesic agent status: Secondary | ICD-10-CM | POA: Diagnosis not present

## 2023-01-10 DIAGNOSIS — A0471 Enterocolitis due to Clostridium difficile, recurrent: Secondary | ICD-10-CM | POA: Diagnosis present

## 2023-01-10 DIAGNOSIS — F32A Depression, unspecified: Secondary | ICD-10-CM | POA: Diagnosis present

## 2023-01-10 LAB — CBC
HCT: 33.7 % — ABNORMAL LOW (ref 36.0–46.0)
Hemoglobin: 11.4 g/dL — ABNORMAL LOW (ref 12.0–15.0)
MCH: 31.5 pg (ref 26.0–34.0)
MCHC: 33.8 g/dL (ref 30.0–36.0)
MCV: 93.1 fL (ref 80.0–100.0)
Platelets: 186 10*3/uL (ref 150–400)
RBC: 3.62 MIL/uL — ABNORMAL LOW (ref 3.87–5.11)
RDW: 13 % (ref 11.5–15.5)
WBC: 12.3 10*3/uL — ABNORMAL HIGH (ref 4.0–10.5)
nRBC: 0 % (ref 0.0–0.2)

## 2023-01-10 LAB — COMPREHENSIVE METABOLIC PANEL
ALT: 18 U/L (ref 0–44)
AST: 14 U/L — ABNORMAL LOW (ref 15–41)
Albumin: 3 g/dL — ABNORMAL LOW (ref 3.5–5.0)
Alkaline Phosphatase: 32 U/L — ABNORMAL LOW (ref 38–126)
Anion gap: 7 (ref 5–15)
BUN: 6 mg/dL (ref 6–20)
CO2: 25 mmol/L (ref 22–32)
Calcium: 8.2 mg/dL — ABNORMAL LOW (ref 8.9–10.3)
Chloride: 106 mmol/L (ref 98–111)
Creatinine, Ser: 0.54 mg/dL (ref 0.44–1.00)
GFR, Estimated: >60 mL/min (ref >60)
Glucose, Bld: 94 mg/dL (ref 70–99)
Potassium: 3.2 mmol/L — ABNORMAL LOW (ref 3.5–5.1)
Sodium: 138 mmol/L (ref 135–145)
Total Bilirubin: 0.4 mg/dL (ref <1.2)
Total Protein: 5.5 g/dL — ABNORMAL LOW (ref 6.5–8.1)

## 2023-01-10 MED ORDER — BOOST / RESOURCE BREEZE PO LIQD CUSTOM
1.0000 | Freq: Three times a day (TID) | ORAL | Status: DC
  Administered 2023-01-10 – 2023-01-11 (×3): 1 via ORAL

## 2023-01-10 MED ORDER — POTASSIUM CHLORIDE CRYS ER 20 MEQ PO TBCR
40.0000 meq | EXTENDED_RELEASE_TABLET | Freq: Once | ORAL | Status: AC
  Administered 2023-01-10: 40 meq via ORAL
  Filled 2023-01-10: qty 2

## 2023-01-10 MED ORDER — FIDAXOMICIN 200 MG PO TABS
200.0000 mg | ORAL_TABLET | Freq: Two times a day (BID) | ORAL | Status: DC
  Administered 2023-01-10 – 2023-01-13 (×7): 200 mg via ORAL
  Filled 2023-01-10 (×7): qty 1

## 2023-01-10 MED ORDER — LACTATED RINGERS IV SOLN: INTRAVENOUS | Status: AC

## 2023-01-10 MED ORDER — NICOTINE 14 MG/24HR TD PT24
14.0000 mg | MEDICATED_PATCH | Freq: Every day | TRANSDERMAL | Status: DC
  Administered 2023-01-10 – 2023-01-13 (×4): 14 mg via TRANSDERMAL
  Filled 2023-01-10 (×4): qty 1

## 2023-01-10 NOTE — Progress Notes (Signed)
Pt has infiltrated right forearm with LR, I/v removed and elevate her arm with pillow. Pharmacy made aware.

## 2023-01-10 NOTE — Progress Notes (Signed)
PROGRESS NOTE    Kathleen Owen  GNF:[id number] DOB: 04-03-1990 DOA: 01/09/2023 PCP: [facility]    Brief Narrative:  32 y.o. female with medical history significant of recurrent c dificile infections who presents with 1 week of diarrhea associated with abdominal pain and intractable nausea and vomiting and poor p.o. intake.  She was recently prescribed clindamycin for bacterial vaginosis.  Has been unable to keep any solid food down.  States the pain is generalized but worse in the upper abdomen/epigastric area.  Multiple episodes of watery foul-smelling diarrhea.  Nonbloody nonbilious.   Continues to have diarrhea and abdominal pain.  Remains hemodynamically stable.   Assessment & Plan:   Principal Problem:   C. difficile diarrhea  C. difficile infection Acute diarrhea Intractable nausea and vomiting Abdominal pain This is patient's third recurrence of C. difficile.  Likely triggered by use of clindamycin. Patient is overall stable CT abdomen pelvis negative for acute colitis Nonbloody diarrhea Plan: Continue LR at 100 cc/h Multimodal pain control As needed antiemetics Continue clear liquid diet today, advance as tolerated Patient was having difficulty tolerating oral vancomycin.  Will switch to p.o. Dificid 200 mg twice daily x 10 days for ease of dosing and use.  History of bacterial vaginosis Recently prescribed clindamycin Antibiotics on hold   History of Crohn's disease May be a contributing factor to the development of recurrent C. Difficile Diagnosis is somewhat in question Patient does not follow with gastroenterology Consider gastroenterology referral on discharge    DVT prophylaxis: SQ Lovenox Code Status: Full Family Communication: Partner at bedside 11/16, 11/17 Disposition Plan: Status is: Observation The patient will require care spanning > 2 midnights and should be moved to inpatient because: C. difficile infection associated with  persistent diarrhea and nausea   Level of care: Med-Surg  Consultants:  None  Procedures:  None  Antimicrobials: Dificid   Subjective: Seen and examined.  Sitting up in bed.  Visibly no distress.  Reports fatigue and persistent diarrhea.  Reports nausea.  Objective: Vitals:   01/10/23 0338 01/10/23 0345 01/10/23 0627 01/10/23 0809  BP: (!) 87/53 (!) 90/55 96/63 90/60   Pulse:   60   Resp:      Temp:      TempSrc:      SpO2:   100%   Weight:      Height:        Intake/Output Summary (Last 24 hours) at 01/10/2023 0952 Last data filed at 01/09/2023 1739 Gross per 24 hour  Intake 999 ml  Output --  Net 999 ml   Filed Weights   01/09/23 1338  Weight: 56.7 kg    Examination:  General exam: Appears calm and comfortable  Respiratory system: Clear to auscultation. Respiratory effort normal. Cardiovascular system: S1-S2, tachycardic, regular rhythm, no murmurs, no pedal edema Gastrointestinal system: Soft, nontender, nondistended, hyperactive bowel sounds Central nervous system: Alert and oriented. No focal neurological deficits. Extremities: Symmetric 5 x 5 power. Skin: No rashes, lesions or ulcers Psychiatry: Judgement and insight appear normal. Mood & affect appropriate.     Data Reviewed: I have personally reviewed following labs and imaging studies  CBC: Recent Labs  Lab 01/09/23 1344 01/10/23 0415  WBC 12.2* 12.3*  HGB 15.0 11.4*  HCT 43.7 33.7*  MCV 90.9 93.1  PLT 266 186   Basic Metabolic Panel: Recent Labs  Lab 01/09/23 1344 01/10/23 0415  NA 134* 138  K 3.2* 3.2*  CL 101 106  CO2 25 25  GLUCOSE 103*  94  BUN 9 6  CREATININE 0.63 0.54  CALCIUM 8.8* 8.2*   GFR: Estimated Creatinine Clearance: 83.5 mL/min (by C-G formula based on SCr of 0.54 mg/dL). Liver Function Tests: Recent Labs  Lab 01/09/23 1344 01/10/23 0415  AST 21 14*  ALT 23 18  ALKPHOS 46 32*  BILITOT 0.7 0.4  PROT 7.9 5.5*  ALBUMIN 4.4 3.0*   Recent Labs  Lab  01/09/23 1344  LIPASE 31   No results for input(s): "AMMONIA" in the last 168 hours. Coagulation Profile: No results for input(s): "INR", "PROTIME" in the last 168 hours. Cardiac Enzymes: No results for input(s): "CKTOTAL", "CKMB", "CKMBINDEX", "TROPONINI" in the last 168 hours. BNP (last 3 results) No results for input(s): "PROBNP" in the last 8760 hours. HbA1C: No results for input(s): "HGBA1C" in the last 72 hours. CBG: No results for input(s): "GLUCAP" in the last 168 hours. Lipid Profile: No results for input(s): "CHOL", "HDL", "LDLCALC", "TRIG", "CHOLHDL", "LDLDIRECT" in the last 72 hours. Thyroid Function Tests: No results for input(s): "TSH", "T4TOTAL", "FREET4", "T3FREE", "THYROIDAB" in the last 72 hours. Anemia Panel: No results for input(s): "VITAMINB12", "FOLATE", "FERRITIN", "TIBC", "IRON", "RETICCTPCT" in the last 72 hours. Sepsis Labs: No results for input(s): "PROCALCITON", "LATICACIDVEN" in the last 168 hours.  Recent Results (from the past 240 hour(s))  C Difficile Quick Screen w PCR reflex     Status: Abnormal   Collection Time: 01/09/23  2:39 PM   Specimen: STOOL  Result Value Ref Range Status   C Diff antigen POSITIVE (A) NEGATIVE Final   C Diff toxin POSITIVE (A) NEGATIVE Final   C Diff interpretation Toxin producing C. difficile detected.  Final    Comment: CRITICAL RESULT CALLED TO, READ BACK BY AND VERIFIED WITH:  LES WILLOUGHBY 01/09/2023 1540 CP Performed at [facility], [address], [place], [place] [address]          Radiology Studies: CT ABDOMEN PELVIS W CONTRAST  Result Date: 01/09/2023 CLINICAL DATA:  Abdominal pain. EXAM: CT ABDOMEN AND PELVIS WITH CONTRAST TECHNIQUE: Multidetector CT imaging of the abdomen and pelvis was performed using the standard protocol following bolus administration of intravenous contrast. RADIATION DOSE REDUCTION: This exam was performed according to the departmental dose-optimization program which  includes automated exposure control, adjustment of the mA and/or kV according to patient size and/or use of iterative reconstruction technique. CONTRAST:  OMNIPAQUE IOHEXOL 300 MG/ML  SOLN COMPARISON:  September 11, 2020 FINDINGS: Lower chest: No acute abnormality. Hepatobiliary: No focal liver abnormality is seen. No gallstones, gallbladder wall thickening, or biliary dilatation. Pancreas: Unremarkable. No pancreatic ductal dilatation or surrounding inflammatory changes. Spleen: Normal in size without focal abnormality. Adrenals/Urinary Tract: Adrenal glands are unremarkable. Kidneys are normal, without renal calculi, focal lesion, or hydronephrosis. Bladder is unremarkable. Stomach/Bowel: Stomach is within normal limits. Appendix appears normal. No evidence of bowel wall thickening, distention, or inflammatory changes. Vascular/Lymphatic: No significant vascular findings are present. No enlarged abdominal or pelvic lymph nodes. Reproductive: Uterus and bilateral adnexa are unremarkable. Other: No abdominal wall hernia or abnormality. No abdominopelvic ascites. Musculoskeletal: No acute or significant osseous findings. IMPRESSION: No CT evidence of acute abdominal/pelvic process. Electronically Signed   By: Ted Mcalpine M.D.   On: 01/09/2023 16:31        Scheduled Meds:  enoxaparin (LOVENOX) injection  40 mg Subcutaneous Q24H   feeding supplement  1 Container Oral TID BM   fidaxomicin  200 mg Oral BID   nicotine  14 mg Transdermal  Daily   Continuous Infusions:  famotidine (PEPCID) IV 20 mg (01/10/23 0932)   lactated ringers 100 mL/hr at 01/10/23 0929     LOS: 0 days     Tresa Moore, MD [facility]   If 7PM-7AM, please contact night-coverage  01/10/2023, 9:52 AM

## 2023-01-10 NOTE — Plan of Care (Addendum)
Patient is alert and oriented X 4. She has been receiving prn medicine for pain and nausea as order. Blood pressure is soft. She has been receiving at the rate of 100 ml/hrs.   Problem: Education: Goal: Knowledge of General Education information will improve Description: Including pain rating scale, medication(s)/side effects and non-pharmacologic comfort measures Outcome: Progressing   Problem: Health Behavior/Discharge Planning: Goal: Ability to manage health-related needs will improve Outcome: Progressing   Problem: Clinical Measurements: Goal: Ability to maintain clinical measurements within normal limits will improve Outcome: Progressing Goal: Will remain free from infection Outcome: Progressing Goal: Diagnostic test results will improve Outcome: Progressing Goal: Respiratory complications will improve Outcome: Progressing Goal: Cardiovascular complication will be avoided Outcome: Progressing   Problem: Activity: Goal: Risk for activity intolerance will decrease Outcome: Progressing   Problem: Nutrition: Goal: Adequate nutrition will be maintained Outcome: Progressing   Problem: Coping: Goal: Level of anxiety will decrease Outcome: Progressing   Problem: Elimination: Goal: Will not experience complications related to bowel motility Outcome: Progressing Goal: Will not experience complications related to urinary retention Outcome: Progressing   Problem: Pain Management: Goal: General experience of comfort will improve Outcome: Progressing   Problem: Safety: Goal: Ability to remain free from injury will improve Outcome: Progressing   Problem: Skin Integrity: Goal: Risk for impaired skin integrity will decrease Outcome: Progressing

## 2023-01-11 ENCOUNTER — Other Ambulatory Visit (HOSPITAL_COMMUNITY): Payer: Self-pay

## 2023-01-11 ENCOUNTER — Inpatient Hospital Stay: Payer: Medicaid Other

## 2023-01-11 DIAGNOSIS — A0472 Enterocolitis due to Clostridium difficile, not specified as recurrent: Secondary | ICD-10-CM | POA: Diagnosis not present

## 2023-01-11 LAB — CBC WITH DIFFERENTIAL/PLATELET
Abs Immature Granulocytes: 0.03 10*3/uL (ref 0.00–0.07)
Basophils Absolute: 0.1 10*3/uL (ref 0.0–0.1)
Basophils Relative: 1 %
Eosinophils Absolute: 0.3 10*3/uL (ref 0.0–0.5)
Eosinophils Relative: 4 %
HCT: 34.5 % — ABNORMAL LOW (ref 36.0–46.0)
Hemoglobin: 11.4 g/dL — ABNORMAL LOW (ref 12.0–15.0)
Immature Granulocytes: 0 %
Lymphocytes Relative: 43 %
Lymphs Abs: 3.5 10*3/uL (ref 0.7–4.0)
MCH: 30.7 pg (ref 26.0–34.0)
MCHC: 33 g/dL (ref 30.0–36.0)
MCV: 93 fL (ref 80.0–100.0)
Monocytes Absolute: 0.6 10*3/uL (ref 0.1–1.0)
Monocytes Relative: 7 %
Neutro Abs: 3.6 10*3/uL (ref 1.7–7.7)
Neutrophils Relative %: 45 %
Platelets: 174 10*3/uL (ref 150–400)
RBC: 3.71 MIL/uL — ABNORMAL LOW (ref 3.87–5.11)
RDW: 13.2 % (ref 11.5–15.5)
WBC: 8.1 10*3/uL (ref 4.0–10.5)
nRBC: 0 % (ref 0.0–0.2)

## 2023-01-11 LAB — BASIC METABOLIC PANEL
Anion gap: 6 (ref 5–15)
BUN: <5 mg/dL — ABNORMAL LOW (ref 6–20)
CO2: 28 mmol/L (ref 22–32)
Calcium: 8.5 mg/dL — ABNORMAL LOW (ref 8.9–10.3)
Chloride: 105 mmol/L (ref 98–111)
Creatinine, Ser: 0.5 mg/dL (ref 0.44–1.00)
GFR, Estimated: >60 mL/min (ref >60)
Glucose, Bld: 102 mg/dL — ABNORMAL HIGH (ref 70–99)
Potassium: 3.5 mmol/L (ref 3.5–5.1)
Sodium: 139 mmol/L (ref 135–145)

## 2023-01-11 MED ORDER — FAMOTIDINE 20 MG PO TABS
20.0000 mg | ORAL_TABLET | Freq: Two times a day (BID) | ORAL | Status: DC
  Administered 2023-01-11 – 2023-01-13 (×4): 20 mg via ORAL
  Filled 2023-01-11 (×4): qty 1

## 2023-01-11 MED ORDER — ALPRAZOLAM 0.25 MG PO TABS
0.2500 mg | ORAL_TABLET | Freq: Three times a day (TID) | ORAL | Status: DC | PRN
  Administered 2023-01-11 – 2023-01-12 (×3): 0.25 mg via ORAL
  Filled 2023-01-11 (×3): qty 1

## 2023-01-11 MED ORDER — LACTATED RINGERS IV SOLN: INTRAVENOUS | Status: AC

## 2023-01-11 MED ORDER — FAMOTIDINE 20 MG PO TABS: 20.0000 mg | ORAL_TABLET | Freq: Two times a day (BID) | ORAL | Status: DC

## 2023-01-11 NOTE — Plan of Care (Addendum)
Patient is alert and oriented X 4. Pt is getting a LR at the rate of 100 ml/hrs. She has been getting a prn medicine for pain and nausea. She still has been having a multiple  bowel movements and one episode of vomiting. MD made aware. New order received. Plan of care is ongoing.  Problem: Education: Goal: Knowledge of General Education information will improve Description: Including pain rating scale, medication(s)/side effects and non-pharmacologic comfort measures Outcome: Progressing   Problem: Health Behavior/Discharge Planning: Goal: Ability to manage health-related needs will improve Outcome: Progressing   Problem: Clinical Measurements: Goal: Ability to maintain clinical measurements within normal limits will improve Outcome: Progressing Goal: Will remain free from infection Outcome: Progressing Goal: Diagnostic test results will improve Outcome: Progressing Goal: Respiratory complications will improve Outcome: Progressing Goal: Cardiovascular complication will be avoided Outcome: Progressing   Problem: Activity: Goal: Risk for activity intolerance will decrease Outcome: Progressing   Problem: Nutrition: Goal: Adequate nutrition will be maintained Outcome: Progressing   Problem: Coping: Goal: Level of anxiety will decrease Outcome: Progressing   Problem: Elimination: Goal: Will not experience complications related to bowel motility Outcome: Progressing Goal: Will not experience complications related to urinary retention Outcome: Progressing   Problem: Pain Management: Goal: General experience of comfort will improve Outcome: Progressing   Problem: Safety: Goal: Ability to remain free from injury will improve Outcome: Progressing   Problem: Skin Integrity: Goal: Risk for impaired skin integrity will decrease Outcome: Progressing

## 2023-01-11 NOTE — Discharge Instructions (Signed)
Your nurse navigator can be reached at [phone]

## 2023-01-11 NOTE — Progress Notes (Signed)
PROGRESS NOTE    Kathleen Owen  UJW:[id number] DOB: 03-Feb-1991 DOA: 01/09/2023 PCP: [facility]    Brief Narrative:  32 y.o. female with medical history significant of recurrent c dificile infections who presents with 1 week of diarrhea associated with abdominal pain and intractable nausea and vomiting and poor p.o. intake.  She was recently prescribed clindamycin for bacterial vaginosis.  Has been unable to keep any solid food down.  States the pain is generalized but worse in the upper abdomen/epigastric area.  Multiple episodes of watery foul-smelling diarrhea.  Nonbloody nonbilious.   Continues to have diarrhea and abdominal pain.  Remains hemodynamically stable.   Assessment & Plan:   Principal Problem:   C. difficile diarrhea  C. difficile infection Acute diarrhea Intractable nausea and vomiting Abdominal pain This is patient's third recurrence of C. difficile.  Likely triggered by use of clindamycin. Patient is overall stable CT abdomen pelvis negative for acute colitis Nonbloody diarrhea Diarrhea persistent Plan: Continue LR at 100 cc/h Multimodal pain control As needed antiemetics Clears for today.  Advance to full liquid as tolerated Continue Dificid 200 mg p.o. twice daily Pharmacy doing benefits check and initiating prior authorization for Dificid  History of bacterial vaginosis Recently prescribed clindamycin Antibiotics on hold   History of Crohn's disease May be a contributing factor to the development of recurrent C. Difficile Diagnosis is somewhat in question Patient does not follow with gastroenterology Consider gastroenterology referral on discharge    DVT prophylaxis: SQ Lovenox Code Status: Full Family Communication: Partner at bedside 11/16, 11/17 Disposition Plan: Status is: Inpatient Remains inpatient appropriate because: C. difficile infection with persistent diarrhea     Level of care: Med-Surg  Consultants:   None  Procedures:  None  Antimicrobials: Dificid   Subjective: Seen and examined.  Lying in bed.  Appears fatigued.  Reports some emotional stress.  Objective: Vitals:   01/10/23 1636 01/10/23 2014 01/11/23 0539 01/11/23 0746  BP: 93/63 107/73 91/71 94/66   Pulse: 65 71 60 64  Resp:  18  18  Temp: 98 F (36.7 C) 98 F (36.7 C) 97.7 F (36.5 C) 98 F (36.7 C)  TempSrc: Oral   Oral  SpO2: 97% 100% 98% 100%  Weight:      Height:        Intake/Output Summary (Last 24 hours) at 01/11/2023 1107 Last data filed at 01/11/2023 1058 Gross per 24 hour  Intake 2511.43 ml  Output --  Net 2511.43 ml   Filed Weights   01/09/23 1338  Weight: 56.7 kg    Examination:  General exam: NAD. Respiratory system: Lungs clear normal work of breathing.  Room air Cardiovascular system: S1-S2, RRR, no murmurs, no pedal edema Gastrointestinal system: Soft, nontender, nondistended, hyperactive bowel sounds Central nervous system: Alert and oriented. No focal neurological deficits. Extremities: Symmetric 5 x 5 power. Skin: No rashes, lesions or ulcers Psychiatry: Judgement and insight appear normal. Mood & affect appropriate.     Data Reviewed: I have personally reviewed following labs and imaging studies  CBC: Recent Labs  Lab 01/09/23 1344 01/10/23 0415 01/11/23 0819  WBC 12.2* 12.3* 8.1  NEUTROABS  --   --  3.6  HGB 15.0 11.4* 11.4*  HCT 43.7 33.7* 34.5*  MCV 90.9 93.1 93.0  PLT 266 186 174   Basic Metabolic Panel: Recent Labs  Lab 01/09/23 1344 01/10/23 0415 01/11/23 0819  NA 134* 138 139  K 3.2* 3.2* 3.5  CL 101 106 105  CO2 25  25 28  GLUCOSE 103* 94 102*  BUN 9 6 <5*  CREATININE 0.63 0.54 0.50  CALCIUM 8.8* 8.2* 8.5*   GFR: Estimated Creatinine Clearance: 83.5 mL/min (by C-G formula based on SCr of 0.5 mg/dL). Liver Function Tests: Recent Labs  Lab 01/09/23 1344 01/10/23 0415  AST 21 14*  ALT 23 18  ALKPHOS 46 32*  BILITOT 0.7 0.4  PROT 7.9 5.5*   ALBUMIN 4.4 3.0*   Recent Labs  Lab 01/09/23 1344  LIPASE 31   No results for input(s): "AMMONIA" in the last 168 hours. Coagulation Profile: No results for input(s): "INR", "PROTIME" in the last 168 hours. Cardiac Enzymes: No results for input(s): "CKTOTAL", "CKMB", "CKMBINDEX", "TROPONINI" in the last 168 hours. BNP (last 3 results) No results for input(s): "PROBNP" in the last 8760 hours. HbA1C: No results for input(s): "HGBA1C" in the last 72 hours. CBG: No results for input(s): "GLUCAP" in the last 168 hours. Lipid Profile: No results for input(s): "CHOL", "HDL", "LDLCALC", "TRIG", "CHOLHDL", "LDLDIRECT" in the last 72 hours. Thyroid Function Tests: No results for input(s): "TSH", "T4TOTAL", "FREET4", "T3FREE", "THYROIDAB" in the last 72 hours. Anemia Panel: No results for input(s): "VITAMINB12", "FOLATE", "FERRITIN", "TIBC", "IRON", "RETICCTPCT" in the last 72 hours. Sepsis Labs: No results for input(s): "PROCALCITON", "LATICACIDVEN" in the last 168 hours.  Recent Results (from the past 240 hour(s))  C Difficile Quick Screen w PCR reflex     Status: Abnormal   Collection Time: 01/09/23  2:39 PM   Specimen: STOOL  Result Value Ref Range Status   C Diff antigen POSITIVE (A) NEGATIVE Final   C Diff toxin POSITIVE (A) NEGATIVE Final   C Diff interpretation Toxin producing C. difficile detected.  Final    Comment: CRITICAL RESULT CALLED TO, READ BACK BY AND VERIFIED WITH:  LES WILLOUGHBY 01/09/2023 1540 CP Performed at [facility], [address] [place], [place] [address]          Radiology Studies: CT ABDOMEN PELVIS W CONTRAST  Result Date: 01/09/2023 CLINICAL DATA:  Abdominal pain. EXAM: CT ABDOMEN AND PELVIS WITH CONTRAST TECHNIQUE: Multidetector CT imaging of the abdomen and pelvis was performed using the standard protocol following bolus administration of intravenous contrast. RADIATION DOSE REDUCTION: This exam was performed according to the  departmental dose-optimization program which includes automated exposure control, adjustment of the mA and/or kV according to patient size and/or use of iterative reconstruction technique. CONTRAST:  OMNIPAQUE IOHEXOL 300 MG/ML  SOLN COMPARISON:  September 11, 2020 FINDINGS: Lower chest: No acute abnormality. Hepatobiliary: No focal liver abnormality is seen. No gallstones, gallbladder wall thickening, or biliary dilatation. Pancreas: Unremarkable. No pancreatic ductal dilatation or surrounding inflammatory changes. Spleen: Normal in size without focal abnormality. Adrenals/Urinary Tract: Adrenal glands are unremarkable. Kidneys are normal, without renal calculi, focal lesion, or hydronephrosis. Bladder is unremarkable. Stomach/Bowel: Stomach is within normal limits. Appendix appears normal. No evidence of bowel wall thickening, distention, or inflammatory changes. Vascular/Lymphatic: No significant vascular findings are present. No enlarged abdominal or pelvic lymph nodes. Reproductive: Uterus and bilateral adnexa are unremarkable. Other: No abdominal wall hernia or abnormality. No abdominopelvic ascites. Musculoskeletal: No acute or significant osseous findings. IMPRESSION: No CT evidence of acute abdominal/pelvic process. Electronically Signed   By: Ted Mcalpine M.D.   On: 01/09/2023 16:31        Scheduled Meds:  enoxaparin (LOVENOX) injection  40 mg Subcutaneous Q24H   famotidine  20 mg Oral BID   feeding supplement  1 Container Oral  TID BM   fidaxomicin  200 mg Oral BID   nicotine  14 mg Transdermal Daily   Continuous Infusions:  lactated ringers       LOS: 1 day     Tresa Moore, MD [facility]   If 7PM-7AM, please contact night-coverage  01/11/2023, 11:07 AM

## 2023-01-11 NOTE — Plan of Care (Signed)

## 2023-01-12 ENCOUNTER — Telehealth (HOSPITAL_COMMUNITY): Payer: Self-pay | Admitting: Pharmacy Technician

## 2023-01-12 ENCOUNTER — Other Ambulatory Visit: Payer: Self-pay

## 2023-01-12 DIAGNOSIS — A0472 Enterocolitis due to Clostridium difficile, not specified as recurrent: Secondary | ICD-10-CM | POA: Diagnosis not present

## 2023-01-12 MED ORDER — PANTOPRAZOLE SODIUM 40 MG PO TBEC
40.0000 mg | DELAYED_RELEASE_TABLET | Freq: Two times a day (BID) | ORAL | Status: DC
  Administered 2023-01-12 – 2023-01-13 (×3): 40 mg via ORAL
  Filled 2023-01-12 (×3): qty 1

## 2023-01-12 NOTE — Telephone Encounter (Addendum)
Patient Product/process development scientist completed.    The patient is insured through [facility] MEDICAID.     Ran test claim for Dificid 200 mg and the current 10 day co-pay is $4.00.   This test claim was processed through [facility]- copay amounts may vary at other pharmacies due to pharmacy/plan contracts, or as the patient moves through the different stages of their insurance plan.     Roland Earl, CPHT Pharmacy Technician III Certified Patient Advocate [facility] Patient Advocate Team Direct Number: [phone]  Fax: [phone]

## 2023-01-12 NOTE — Progress Notes (Signed)
PROGRESS NOTE    Kathleen DESAUTEL  ION:[id number] DOB: 02/16/1991 DOA: 01/09/2023 PCP: [facility]    Brief Narrative:  32 y.o. female with medical history significant of recurrent c dificile infections who presents with 1 week of diarrhea associated with abdominal pain and intractable nausea and vomiting and poor p.o. intake.  She was recently prescribed clindamycin for bacterial vaginosis.  Has been unable to keep any solid food down.  States the pain is generalized but worse in the upper abdomen/epigastric area.  Multiple episodes of watery foul-smelling diarrhea.  Nonbloody nonbilious.   Continues to have diarrhea and abdominal pain.  Remains hemodynamically stable.   Assessment & Plan:   Principal Problem:   C. difficile diarrhea  C. difficile infection Acute diarrhea Intractable nausea and vomiting Abdominal pain This is patient's third recurrence of C. difficile.  Likely triggered by use of clindamycin. Patient is overall stable CT abdomen pelvis negative for acute colitis Nonbloody diarrhea Diarrhea persistent but slowly improving Plan: Continue LR at 100 cc/h Multimodal pain control As needed antiemetics Full liquid diet.  Advance as tolerated Continue Dificid 200 mg p.o. twice daily x 10-day course Test claim for Dificid 200 mg is $4.  Please send to [facility] pharmacy at time of discharge  History of bacterial vaginosis Recently prescribed clindamycin Antibiotics on hold Outpatient GYN follow-up   History of Crohn's disease May be a contributing factor to the development of recurrent C. Difficile Diagnosis is somewhat in question Patient does not follow with gastroenterology Consider gastroenterology referral on discharge    DVT prophylaxis: SQ Lovenox Code Status: Full Family Communication: Partner at bedside 11/16, 11/17 Disposition Plan: Status is: Inpatient Remains inpatient appropriate because: C. difficile infection with persistent  diarrhea     Level of care: Med-Surg  Consultants:  None  Procedures:  None  Antimicrobials: Dificid   Subjective: Seen and examined.  Lying in bed.  Reports some burning epigastric pain otherwise seems to be slowly improving.  Objective: Vitals:   01/11/23 1751 01/11/23 2143 01/12/23 0518 01/12/23 0820  BP: 128/77 100/62 (!) 96/57 104/67  Pulse: 77 69 64 74  Resp: 18 18  17   Temp: 98 F (36.7 C) 97.9 F (36.6 C) 98 F (36.7 C) 98.8 F (37.1 C)  TempSrc: Oral     SpO2: 99% 99% 100% 100%  Weight:      Height:        Intake/Output Summary (Last 24 hours) at 01/12/2023 1200 Last data filed at 01/12/2023 0502 Gross per 24 hour  Intake 2546.99 ml  Output 7 ml  Net 2539.99 ml   Filed Weights   01/09/23 1338  Weight: 56.7 kg    Examination:  General exam: No acute distress. Respiratory system: Lungs clear normal work of breathing.  Room air Cardiovascular system: S1-S2, RRR, no murmurs, no pedal edema Gastrointestinal system: Soft, mild TTP epigastrium, hyperactive bowel sounds Central nervous system: Alert and oriented. No focal neurological deficits. Extremities: Symmetric 5 x 5 power. Skin: No rashes, lesions or ulcers Psychiatry: Judgement and insight appear normal. Mood & affect appropriate.     Data Reviewed: I have personally reviewed following labs and imaging studies  CBC: Recent Labs  Lab 01/09/23 1344 01/10/23 0415 01/11/23 0819  WBC 12.2* 12.3* 8.1  NEUTROABS  --   --  3.6  HGB 15.0 11.4* 11.4*  HCT 43.7 33.7* 34.5*  MCV 90.9 93.1 93.0  PLT 266 186 174   Basic Metabolic Panel: Recent Labs  Lab 01/09/23  1344 01/10/23 0415 01/11/23 0819  NA 134* 138 139  K 3.2* 3.2* 3.5  CL 101 106 105  CO2 25 25 28   GLUCOSE 103* 94 102*  BUN 9 6 <5*  CREATININE 0.63 0.54 0.50  CALCIUM 8.8* 8.2* 8.5*   GFR: Estimated Creatinine Clearance: 83.5 mL/min (by C-G formula based on SCr of 0.5 mg/dL). Liver Function Tests: Recent Labs  Lab  01/09/23 1344 01/10/23 0415  AST 21 14*  ALT 23 18  ALKPHOS 46 32*  BILITOT 0.7 0.4  PROT 7.9 5.5*  ALBUMIN 4.4 3.0*   Recent Labs  Lab 01/09/23 1344  LIPASE 31   No results for input(s): "AMMONIA" in the last 168 hours. Coagulation Profile: No results for input(s): "INR", "PROTIME" in the last 168 hours. Cardiac Enzymes: No results for input(s): "CKTOTAL", "CKMB", "CKMBINDEX", "TROPONINI" in the last 168 hours. BNP (last 3 results) No results for input(s): "PROBNP" in the last 8760 hours. HbA1C: No results for input(s): "HGBA1C" in the last 72 hours. CBG: No results for input(s): "GLUCAP" in the last 168 hours. Lipid Profile: No results for input(s): "CHOL", "HDL", "LDLCALC", "TRIG", "CHOLHDL", "LDLDIRECT" in the last 72 hours. Thyroid Function Tests: No results for input(s): "TSH", "T4TOTAL", "FREET4", "T3FREE", "THYROIDAB" in the last 72 hours. Anemia Panel: No results for input(s): "VITAMINB12", "FOLATE", "FERRITIN", "TIBC", "IRON", "RETICCTPCT" in the last 72 hours. Sepsis Labs: No results for input(s): "PROCALCITON", "LATICACIDVEN" in the last 168 hours.  Recent Results (from the past 240 hour(s))  C Difficile Quick Screen w PCR reflex     Status: Abnormal   Collection Time: 01/09/23  2:39 PM   Specimen: STOOL  Result Value Ref Range Status   C Diff antigen POSITIVE (A) NEGATIVE Final   C Diff toxin POSITIVE (A) NEGATIVE Final   C Diff interpretation Toxin producing C. difficile detected.  Final    Comment: CRITICAL RESULT CALLED TO, READ BACK BY AND VERIFIED WITH:  LES WILLOUGHBY 01/09/2023 1540 CP Performed at [facility], [address], [place], [place] [address]          Radiology Studies: DG Abd 1 View  Result Date: 01/11/2023 CLINICAL DATA:  Abdominal pain and distension. EXAM: ABDOMEN - 1 VIEW COMPARISON:  CT abdomen pelvis dated 01/09/2023. FINDINGS: No bowel dilatation or evidence of obstruction. No free air or radiopaque calculi.  The osseous structures are intact. The soft tissues are unremarkable. IMPRESSION: Negative. Electronically Signed   By: Elgie Collard M.D.   On: 01/11/2023 19:02        Scheduled Meds:  enoxaparin (LOVENOX) injection  40 mg Subcutaneous Q24H   famotidine  20 mg Oral BID   feeding supplement  1 Container Oral TID BM   fidaxomicin  200 mg Oral BID   nicotine  14 mg Transdermal Daily   pantoprazole  40 mg Oral BID   Continuous Infusions:     LOS: 2 days     Tresa Moore, MD [facility]   If 7PM-7AM, please contact night-coverage  01/12/2023, 12:00 PM

## 2023-01-13 ENCOUNTER — Other Ambulatory Visit: Payer: Self-pay

## 2023-01-13 DIAGNOSIS — A0472 Enterocolitis due to Clostridium difficile, not specified as recurrent: Secondary | ICD-10-CM | POA: Diagnosis not present

## 2023-01-13 MED ORDER — IPRATROPIUM-ALBUTEROL 0.5-2.5 (3) MG/3ML IN SOLN: 3.0000 mL | RESPIRATORY_TRACT | Status: DC | PRN

## 2023-01-13 MED ORDER — METOPROLOL TARTRATE 5 MG/5ML IV SOLN: 5.0000 mg | INTRAVENOUS | Status: DC | PRN

## 2023-01-13 MED ORDER — HYDRALAZINE HCL 20 MG/ML IJ SOLN: 10.0000 mg | INTRAMUSCULAR | Status: DC | PRN

## 2023-01-13 MED ORDER — ONDANSETRON 4 MG PO TBDP
4.0000 mg | ORAL_TABLET | Freq: Three times a day (TID) | ORAL | 0 refills | Status: DC | PRN
  Filled 2023-01-13: qty 20, 7d supply, fill #0

## 2023-01-13 MED ORDER — PANTOPRAZOLE SODIUM 40 MG PO TBEC
40.0000 mg | DELAYED_RELEASE_TABLET | Freq: Every day | ORAL | 0 refills | Status: DC
  Filled 2023-01-13: qty 30, 30d supply, fill #0

## 2023-01-13 MED ORDER — FIDAXOMICIN 200 MG PO TABS
200.0000 mg | ORAL_TABLET | Freq: Two times a day (BID) | ORAL | 0 refills | Status: DC
  Filled 2023-01-13: qty 12, 6d supply, fill #0

## 2023-01-13 MED ORDER — SENNOSIDES-DOCUSATE SODIUM 8.6-50 MG PO TABS: 1.0000 | ORAL_TABLET | Freq: Every evening | ORAL | Status: DC | PRN

## 2023-01-13 MED ORDER — GUAIFENESIN 100 MG/5ML PO LIQD: 5.0000 mL | ORAL | Status: DC | PRN

## 2023-01-13 NOTE — Discharge Summary (Signed)
Physician Discharge Summary  Kathleen Owen UJW:[id number] DOB: [birth year] DOA: 01/09/2023  PCP: [facility]  Admit date: 01/09/2023 Discharge date: 01/13/2023  Admitted From: Home Disposition: Home  Recommendations for Outpatient Follow-up:  Follow up with PCP in 1-2 weeks Please obtain BMP/CBC in one week your next doctors visit.  Zofran as needed Dificid for 6 more days to complete total 10-day course Follow-up patient GI   Discharge Condition: Stable CODE STATUS: Full code Diet recommendation: Low residue, bland diet  Brief/Interim Summary:  Brief Narrative:  32 y.o. female with medical history significant of recurrent c dificile infections who presents with 1 week of diarrhea associated with abdominal pain and intractable nausea and vomiting and poor p.o. intake.  She was recently prescribed clindamycin for bacterial vaginosis.  Has been unable to keep any solid food down.  States the pain is generalized but worse in the upper abdomen/epigastric area.  Multiple episodes of watery foul-smelling diarrhea.  Nonbloody nonbilious.  Patient was diagnosed with C. difficile colitis   Continues to have diarrhea and abdominal pain.  Remains hemodynamically stable.  Stools are more formed now tolerating Dificid.  Medically stable for discharge today.     Assessment & Plan:   Principal Problem:   C. difficile diarrhea  C. difficile infection Acute diarrhea Intractable nausea and vomiting Abdominal pain Unfortunately recurrent C. difficile.  Currently on Dificid twice daily, total 10-day EOT 11/26.  I have advised her to follow-up outpatient GI.  Would recommend colonoscopy in next 6-8 weeks. Supportive care.   History of bacterial vaginosis Recently prescribed clindamycin.  Currently on hold.  Outpatient follow-up with GYN   History of Crohn's disease Possibly contributing to her C. difficile.  This diagnosis is in question, should follow-up outpatient  GI Seen by Dr Allegra Lai in 2019   DVT prophylaxis: SQ Lovenox Code Status: Full Family Communication: Partner at bedside 11/16, 11/17 Disposition Plan: Status is: Inpatient Remains inpatient appropriate because: C. difficile infection with persistent diarrhea    Subjective: Stools are much more formed.  Frequency of diarrhea has decreased.  Still having some abdominal pain but I advised her to slowly advance her diet.  Physical examination:   General exam: No acute distress. Respiratory system: Lungs clear normal work of breathing.  Room air Cardiovascular system: S1-S2, RRR, no murmurs, no pedal edema Gastrointestinal system: Soft, mild TTP epigastrium, hyperactive bowel sounds Central nervous system: Alert and oriented. No focal neurological deficits. Extremities: Symmetric 5 x 5 power. Skin: No rashes, lesions or ulcers Psychiatry: Judgement and insight appear normal. Mood & affect appropriate.     Discharge Diagnoses:  Principal Problem:   C. difficile diarrhea       Discharge Exam: Vitals:   01/13/23 0455 01/13/23 0746  BP: 99/64 101/60  Pulse: 64 65  Resp:  16  Temp: 97.9 F (36.6 C) 98.6 F (37 C)  SpO2: 95% 97%   Vitals:   01/12/23 1658 01/12/23 2014 01/13/23 0455 01/13/23 0746  BP: 108/67 100/60 99/64 101/60  Pulse: 82 64 64 65  Resp: 18 18  16   Temp: 98.2 F (36.8 C) 98.1 F (36.7 C) 97.9 F (36.6 C) 98.6 F (37 C)  TempSrc: Oral     SpO2: 94% 99% 95% 97%  Weight:      Height:         Discharge Instructions   Allergies as of 01/13/2023       Reactions   Aspirin Other (See Comments)   Chron's Disease Chron's Disease  Other reaction(s): Other (See Comments) Chron's Disease   Ibuprofen Other (See Comments)   Chron's Disease Chron's Disease Other reaction(s): Other (See Comments), Other (See Comments) Chron's Disease "blow flare" per pt   Amoxicillin Rash, Other (See Comments)   Has patient had a PCN reaction causing immediate rash,  facial/tongue/throat swelling, SOB or lightheadedness with hypotension: Unknown Has patient had a PCN reaction causing severe rash involving mucus membranes or skin necrosis: Unknown Has patient had a PCN reaction that required hospitalization: Unknown Has patient had a PCN reaction occurring within the last 10 years: No If all of the above answers are "NO", then may proceed with Cephalosporin use. Other reaction(s): Other (See Comments) Has patient had a PCN reaction causing immediate rash, facial/tongue/throat swelling, SOB or lightheadedness with hypotension: Unknown Has patient had a PCN reaction causing severe rash involving mucus membranes or skin necrosis: Unknown Has patient had a PCN reaction that required hospitalization: Unknown Has patient had a PCN reaction occurring within the last 10 years: No If all of the above answers are "NO", then may proceed with Cephalosporin use.        Medication List     STOP taking these medications    predniSONE 20 MG tablet Commonly known as: DELTASONE       TAKE these medications    acetaminophen 500 MG tablet Commonly known as: TYLENOL Take 500 mg by mouth every 4 (four) hours as needed.   acetaminophen-codeine 300-30 MG tablet Commonly known as: TYLENOL #3 Take 1 tablet by mouth every 4 (four) hours as needed for moderate pain.   fidaxomicin 200 MG Tabs tablet Commonly known as: DIFICID Take 1 tablet (200 mg total) by mouth 2 (two) times daily for 6 days.   ondansetron 4 MG disintegrating tablet Commonly known as: ZOFRAN-ODT Take 1 tablet (4 mg total) by mouth every 8 (eight) hours as needed for nausea or vomiting.   pantoprazole 40 MG tablet Commonly known as: PROTONIX Take 1 tablet (40 mg total) by mouth daily before breakfast.        Follow-up Information     [facility] Follow up.   Why: Hospital follow up. Contact information: [address] [place] [address] [phone]                 Allergies  Allergen Reactions   Aspirin Other (See Comments)    Chron's Disease Chron's Disease Other reaction(s): Other (See Comments) Chron's Disease   Ibuprofen Other (See Comments)    Chron's Disease Chron's Disease Other reaction(s): Other (See Comments), Other (See Comments) Chron's Disease "blow flare" per pt   Amoxicillin Rash and Other (See Comments)    Has patient had a PCN reaction causing immediate rash, facial/tongue/throat swelling, SOB or lightheadedness with hypotension: Unknown Has patient had a PCN reaction causing severe rash involving mucus membranes or skin necrosis: Unknown Has patient had a PCN reaction that required hospitalization: Unknown Has patient had a PCN reaction occurring within the last 10 years: No If all of the above answers are "NO", then may proceed with Cephalosporin use.  Other reaction(s): Other (See Comments) Has patient had a PCN reaction causing immediate rash, facial/tongue/throat swelling, SOB or lightheadedness with hypotension: Unknown Has patient had a PCN reaction causing severe rash involving mucus membranes or skin necrosis: Unknown Has patient had a PCN reaction that required hospitalization: Unknown Has patient had a PCN reaction occurring within the last 10 years: No If all of the above answers are "NO", then  may proceed with Cephalosporin use.    You were cared for by a hospitalist during your hospital stay. If you have any questions about your discharge medications or the care you received while you were in the hospital after you are discharged, you can call the unit and asked to speak with the hospitalist on call if the hospitalist that took care of you is not available. Once you are discharged, your primary care physician will handle any further medical issues. Please note that no refills for any discharge medications will be authorized once you are discharged, as it is imperative that you return to your primary care  physician (or establish a relationship with a primary care physician if you do not have one) for your aftercare needs so that they can reassess your need for medications and monitor your lab values.  You were cared for by a hospitalist during your hospital stay. If you have any questions about your discharge medications or the care you received while you were in the hospital after you are discharged, you can call the unit and asked to speak with the hospitalist on call if the hospitalist that took care of you is not available. Once you are discharged, your primary care physician will handle any further medical issues. Please note that NO REFILLS for any discharge medications will be authorized once you are discharged, as it is imperative that you return to your primary care physician (or establish a relationship with a primary care physician if you do not have one) for your aftercare needs so that they can reassess your need for medications and monitor your lab values.  Please request your Prim.MD to go over all Hospital Tests and Procedure/Radiological results at the follow up, please get all Hospital records sent to your Prim MD by signing hospital release before you go home.  Get CBC, CMP, 2 view Chest X ray checked  by Primary MD during your next visit or SNF MD in 5-7 days ( we routinely change or add medications that can affect your baseline labs and fluid status, therefore we recommend that you get the mentioned basic workup next visit with your PCP, your PCP may decide not to get them or add new tests based on their clinical decision)  On your next visit with your primary care physician please Get Medicines reviewed and adjusted.  If you experience worsening of your admission symptoms, develop shortness of breath, life threatening emergency, suicidal or homicidal thoughts you must seek medical attention immediately by calling 911 or calling your MD immediately  if symptoms less severe.  You Must  read complete instructions/literature along with all the possible adverse reactions/side effects for all the Medicines you take and that have been prescribed to you. Take any new Medicines after you have completely understood and accpet all the possible adverse reactions/side effects.   Do not drive, operate heavy machinery, perform activities at heights, swimming or participation in water activities or provide baby sitting services if your were admitted for syncope or siezures until you have seen by Primary MD or a Neurologist and advised to do so again.  Do not drive when taking Pain medications.   Procedures/Studies: DG Abd 1 View  Result Date: 01/11/2023 CLINICAL DATA:  Abdominal pain and distension. EXAM: ABDOMEN - 1 VIEW COMPARISON:  CT abdomen pelvis dated 01/09/2023. FINDINGS: No bowel dilatation or evidence of obstruction. No free air or radiopaque calculi. The osseous structures are intact. The soft tissues are unremarkable. IMPRESSION: Negative. Electronically  Signed   By: Elgie Collard M.D.   On: 01/11/2023 19:02   CT ABDOMEN PELVIS W CONTRAST  Result Date: 01/09/2023 CLINICAL DATA:  Abdominal pain. EXAM: CT ABDOMEN AND PELVIS WITH CONTRAST TECHNIQUE: Multidetector CT imaging of the abdomen and pelvis was performed using the standard protocol following bolus administration of intravenous contrast. RADIATION DOSE REDUCTION: This exam was performed according to the departmental dose-optimization program which includes automated exposure control, adjustment of the mA and/or kV according to patient size and/or use of iterative reconstruction technique. CONTRAST:  OMNIPAQUE IOHEXOL 300 MG/ML  SOLN COMPARISON:  September 11, 2020 FINDINGS: Lower chest: No acute abnormality. Hepatobiliary: No focal liver abnormality is seen. No gallstones, gallbladder wall thickening, or biliary dilatation. Pancreas: Unremarkable. No pancreatic ductal dilatation or surrounding inflammatory changes. Spleen:  Normal in size without focal abnormality. Adrenals/Urinary Tract: Adrenal glands are unremarkable. Kidneys are normal, without renal calculi, focal lesion, or hydronephrosis. Bladder is unremarkable. Stomach/Bowel: Stomach is within normal limits. Appendix appears normal. No evidence of bowel wall thickening, distention, or inflammatory changes. Vascular/Lymphatic: No significant vascular findings are present. No enlarged abdominal or pelvic lymph nodes. Reproductive: Uterus and bilateral adnexa are unremarkable. Other: No abdominal wall hernia or abnormality. No abdominopelvic ascites. Musculoskeletal: No acute or significant osseous findings. IMPRESSION: No CT evidence of acute abdominal/pelvic process. Electronically Signed   By: Ted Mcalpine M.D.   On: 01/09/2023 16:31     The results of significant diagnostics from this hospitalization (including imaging, microbiology, ancillary and laboratory) are listed below for reference.     Microbiology: Recent Results (from the past 240 hour(s))  C Difficile Quick Screen w PCR reflex     Status: Abnormal   Collection Time: 01/09/23  2:39 PM   Specimen: STOOL  Result Value Ref Range Status   C Diff antigen POSITIVE (A) NEGATIVE Final   C Diff toxin POSITIVE (A) NEGATIVE Final   C Diff interpretation Toxin producing C. difficile detected.  Final    Comment: CRITICAL RESULT CALLED TO, READ BACK BY AND VERIFIED WITH:  LES WILLOUGHBY 01/09/2023 1540 CP Performed at [facility], [address] [place], [place] [address]      Labs: BNP (last 3 results) No results for input(s): "BNP" in the last 8760 hours. Basic Metabolic Panel: Recent Labs  Lab 01/09/23 1344 01/10/23 0415 01/11/23 0819  NA 134* 138 139  K 3.2* 3.2* 3.5  CL 101 106 105  CO2 25 25 28   GLUCOSE 103* 94 102*  BUN 9 6 <5*  CREATININE 0.63 0.54 0.50  CALCIUM 8.8* 8.2* 8.5*   Liver Function Tests: Recent Labs  Lab 01/09/23 1344 01/10/23 0415  AST 21 14*   ALT 23 18  ALKPHOS 46 32*  BILITOT 0.7 0.4  PROT 7.9 5.5*  ALBUMIN 4.4 3.0*   Recent Labs  Lab 01/09/23 1344  LIPASE 31   No results for input(s): "AMMONIA" in the last 168 hours. CBC: Recent Labs  Lab 01/09/23 1344 01/10/23 0415 01/11/23 0819  WBC 12.2* 12.3* 8.1  NEUTROABS  --   --  3.6  HGB 15.0 11.4* 11.4*  HCT 43.7 33.7* 34.5*  MCV 90.9 93.1 93.0  PLT 266 186 174   Cardiac Enzymes: No results for input(s): "CKTOTAL", "CKMB", "CKMBINDEX", "TROPONINI" in the last 168 hours. BNP: Invalid input(s): "POCBNP" CBG: No results for input(s): "GLUCAP" in the last 168 hours. D-Dimer No results for input(s): "DDIMER" in the last 72 hours. Hgb A1c No results for input(s): "HGBA1C"  in the last 72 hours. Lipid Profile No results for input(s): "CHOL", "HDL", "LDLCALC", "TRIG", "CHOLHDL", "LDLDIRECT" in the last 72 hours. Thyroid function studies No results for input(s): "TSH", "T4TOTAL", "T3FREE", "THYROIDAB" in the last 72 hours.  Invalid input(s): "FREET3" Anemia work up No results for input(s): "VITAMINB12", "FOLATE", "FERRITIN", "TIBC", "IRON", "RETICCTPCT" in the last 72 hours. Urinalysis    Component Value Date/Time   COLORURINE AMBER (A) 01/09/2023 1344   APPEARANCEUR CLOUDY (A) 01/09/2023 1344   APPEARANCEUR Hazy 03/23/2014 1437   LABSPEC 1.029 01/09/2023 1344   LABSPEC 1.026 03/23/2014 1437   PHURINE 5.0 01/09/2023 1344   GLUCOSEU NEGATIVE 01/09/2023 1344   GLUCOSEU Negative 03/23/2014 1437   HGBUR NEGATIVE 01/09/2023 1344   BILIRUBINUR NEGATIVE 01/09/2023 1344   BILIRUBINUR neg 10/30/2020 1452   BILIRUBINUR Negative 03/23/2014 1437   KETONESUR NEGATIVE 01/09/2023 1344   PROTEINUR NEGATIVE 01/09/2023 1344   UROBILINOGEN 0.2 10/22/2016 1156   NITRITE NEGATIVE 01/09/2023 1344   LEUKOCYTESUR NEGATIVE 01/09/2023 1344   LEUKOCYTESUR Negative 03/23/2014 1437   Sepsis Labs Recent Labs  Lab 01/09/23 1344 01/10/23 0415 01/11/23 0819  WBC 12.2* 12.3* 8.1    Microbiology Recent Results (from the past 240 hour(s))  C Difficile Quick Screen w PCR reflex     Status: Abnormal   Collection Time: 01/09/23  2:39 PM   Specimen: STOOL  Result Value Ref Range Status   C Diff antigen POSITIVE (A) NEGATIVE Final   C Diff toxin POSITIVE (A) NEGATIVE Final   C Diff interpretation Toxin producing C. difficile detected.  Final    Comment: CRITICAL RESULT CALLED TO, READ BACK BY AND VERIFIED WITH:  LES WILLOUGHBY 01/09/2023 1540 CP Performed at [facility], [address] [place], [place] [address]      Time coordinating discharge:  I have spent 35 minutes face to face with the patient and on the ward discussing the patients care, assessment, plan and disposition with other care givers. >50% of the time was devoted counseling the patient about the risks and benefits of treatment/Discharge disposition and coordinating care.   SIGNED:   Miguel Rota, MD  [facility] 01/13/2023, 12:55 PM   If 7PM-7AM, please contact night-coverage

## 2023-01-13 NOTE — Hospital Course (Addendum)
  Brief Narrative:  32 y.o. female with medical history significant of recurrent c dificile infections who presents with 1 week of diarrhea associated with abdominal pain and intractable nausea and vomiting and poor p.o. intake.  She was recently prescribed clindamycin for bacterial vaginosis.  Has been unable to keep any solid food down.  States the pain is generalized but worse in the upper abdomen/epigastric area.  Multiple episodes of watery foul-smelling diarrhea.  Nonbloody nonbilious.  Patient was diagnosed with C. difficile colitis   Continues to have diarrhea and abdominal pain.  Remains hemodynamically stable.  Stools are more formed now tolerating Dificid.  Medically stable for discharge today.     Assessment & Plan:   Principal Problem:   C. difficile diarrhea  C. difficile infection Acute diarrhea Intractable nausea and vomiting Abdominal pain Unfortunately recurrent C. difficile.  Currently on Dificid twice daily, total 10-day EOT 11/26.  I have advised her to follow-up outpatient GI.  Would recommend colonoscopy in next 6-8 weeks. Supportive care.   History of bacterial vaginosis Recently prescribed clindamycin.  Currently on hold.  Outpatient follow-up with GYN   History of Crohn's disease Possibly contributing to her C. difficile.  This diagnosis is in question, should follow-up outpatient GI Seen by Dr Allegra Lai in 2019   DVT prophylaxis: SQ Lovenox Code Status: Full Family Communication: Partner at bedside 11/16, 11/17 Disposition Plan: Status is: Inpatient Remains inpatient appropriate because: C. difficile infection with persistent diarrhea    Subjective: Stools are much more formed.  Frequency of diarrhea has decreased.  Still having some abdominal pain but I advised her to slowly advance her diet.  Physical examination:   General exam: No acute distress. Respiratory system: Lungs clear normal work of breathing.  Room air Cardiovascular system: S1-S2, RRR, no  murmurs, no pedal edema Gastrointestinal system: Soft, mild TTP epigastrium, hyperactive bowel sounds Central nervous system: Alert and oriented. No focal neurological deficits. Extremities: Symmetric 5 x 5 power. Skin: No rashes, lesions or ulcers Psychiatry: Judgement and insight appear normal. Mood & affect appropriate.

## 2023-01-13 NOTE — Plan of Care (Signed)

## 2023-01-18 ENCOUNTER — Telehealth: Payer: Self-pay

## 2023-01-27 ENCOUNTER — Telehealth: Payer: Self-pay

## 2023-01-27 NOTE — Telephone Encounter (Signed)
Patient called in wanting to know when can she go back to work. I seen she is a patient of [facility] and inform her she will have to call them.

## 2023-02-01 ENCOUNTER — Encounter: Payer: Self-pay | Admitting: Family Medicine

## 2023-02-01 ENCOUNTER — Ambulatory Visit (INDEPENDENT_AMBULATORY_CARE_PROVIDER_SITE_OTHER): Payer: Medicaid Other | Admitting: Family Medicine

## 2023-02-01 VITALS — BP 100/78 | HR 94 | Resp 16 | Ht 63.0 in | Wt 138.5 lb

## 2023-02-01 DIAGNOSIS — F411 Generalized anxiety disorder: Secondary | ICD-10-CM

## 2023-02-01 DIAGNOSIS — A0472 Enterocolitis due to Clostridium difficile, not specified as recurrent: Secondary | ICD-10-CM

## 2023-02-01 DIAGNOSIS — Z09 Encounter for follow-up examination after completed treatment for conditions other than malignant neoplasm: Secondary | ICD-10-CM

## 2023-02-01 MED ORDER — FIDAXOMICIN 200 MG PO TABS: ORAL_TABLET | ORAL | 0 refills | Status: DC

## 2023-02-01 MED ORDER — FIDAXOMICIN 200 MG PO TABS: ORAL_TABLET | ORAL | 0 refills | Status: AC

## 2023-02-01 MED ORDER — ALPRAZOLAM 0.25 MG PO TABS: 0.2500 mg | ORAL_TABLET | Freq: Two times a day (BID) | ORAL | 0 refills | Status: DC | PRN

## 2023-02-01 NOTE — Patient Instructions (Signed)
VISIT SUMMARY:  Today, we discussed your ongoing symptoms following your recent hospitalization for a Clostridium difficile (C. diff) infection. You continue to experience abdominal pain and diarrhea despite initial treatment. We also addressed your anxiety related to marital stress, which has been affecting your sleep and appetite. We reviewed your current medications and made adjustments to better manage your symptoms.  YOUR PLAN:  -CLOSTRIDIOIDES DIFFICILE INFECTION (C. DIFF): C. diff is a bacterial infection that causes severe diarrhea and abdominal pain. Since your symptoms have persisted, we are extending your antibiotic therapy. You will take Fidaxomicin 200 mg twice daily for 5 days, then 200 mg every other day for 20 days. We also recommend strict hand hygiene to prevent reinfection and have referred you to gastroenterology and infectious disease for further evaluation.  I recommend staying out of work to prevent spread of infection. I will provide a note today and recommend that you contact HR to request FMLA paperwork to be sent to me.   -ANXIETY: Anxiety can cause feelings of worry and stress, which can affect your sleep and appetite. Given your recent hospitalization and stress, we have prescribed Xanax 0.25 mg to be taken twice daily as needed for 7 days to help manage your acute symptoms. We discussed the risks and benefits of this medication.  -GENERAL HEALTH MAINTENANCE: It is important to stay hydrated, especially with your ongoing diarrhea, to prevent dehydration. Make sure to drink plenty of fluids.

## 2023-02-01 NOTE — Progress Notes (Signed)
Established patient visit   Patient: Kathleen Owen   DOB: 03-07-1990   31 y.o. Female  MRN: [id number] Visit Date: 02/01/2023  Today's healthcare provider: Ronnald Ramp, MD   Chief Complaint  Patient presents with   Hospitalization Follow-up   Anxiety    She reports she is not eating and sleeping well related to personal problems   Subjective     HPI     Anxiety    Additional comments: She reports she is not eating and sleeping well related to personal problems        Comments   Nausea, abdominal pain and diarrhea continue since being discharged      Last edited by Ashok Cordia, CMA on 02/01/2023 10:22 AM.       Discussed the use of AI scribe software for clinical note transcription with the patient, who gave verbal consent to proceed.  History of Present Illness   The patient is a 32 year old female who was recently hospitalized and diagnosed with Clostridium difficile (C. diff) infection. She was discharged with a prescription for a 10-day course of Fidaxomicin and Zofran as needed. Despite the treatment, the patient continues to experience abdominal pain and diarrhea. The patient also reports anxiety related to marital stress, which has affected her sleep and appetite. During her hospital stay, she was given Xanax, which she found helpful and is requesting a refill.  The patient has a history of gastrointestinal issues and is a known patient of [facility], but has not scheduled a follow-up appointment since her recent hospitalization. She also takes Protonix 40mg  daily for her gastrointestinal symptoms.  The patient's symptoms seem to improve slightly between bouts of infection, but never fully resolve. The frequency of her diarrhea is influenced by her food intake, with episodes often occurring after meals. 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  The patient also has a pineal  cyst, but it is unclear from the conversation if this is contributing to her current symptoms. The patient denies any nausea at this time. She appears overall sad but is not in acute distress and does not appear dehydrated on examination.      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   ondansetron (ZOFRAN-ODT) 4 MG disintegrating tablet Take 1 tablet (4 mg total) by mouth every 8 (eight) hours as needed for nausea or vomiting.   pantoprazole (PROTONIX) 40 MG tablet Take 1 tablet (40 mg total) by mouth daily before breakfast.   [DISCONTINUED] acetaminophen-codeine (TYLENOL #3) 300-30 MG tablet Take 1 tablet by mouth every 4 (four) hours as needed for moderate pain. (Patient not taking: Reported on 01/09/2023)   No facility-administered medications prior to visit.    Review of Systems  Last CBC Lab Results  Component Value Date   WBC 8.1 01/11/2023   HGB 11.4 (L) 01/11/2023   HCT 34.5 (L)  01/11/2023   MCV 93.0 01/11/2023   MCH 30.7 01/11/2023   RDW 13.2 01/11/2023   PLT 174 01/11/2023   Last metabolic panel Lab Results  Component Value Date   GLUCOSE 102 (H) 01/11/2023   NA 139 01/11/2023   K 3.5 01/11/2023   CL 105 01/11/2023   CO2 28 01/11/2023   BUN <5 (L) 01/11/2023   CREATININE 0.50 01/11/2023   GFRNONAA >60 01/11/2023   CALCIUM 8.5 (L) 01/11/2023   PHOS 3.5 07/26/2017   PROT 5.5 (L) 01/10/2023   ALBUMIN 3.0 (L) 01/10/2023   BILITOT 0.4 01/10/2023    ALKPHOS 32 (L) 01/10/2023   AST 14 (L) 01/10/2023   ALT 18 01/10/2023   ANIONGAP 6 01/11/2023   Last hemoglobin A1c No results found for: "HGBA1C" Last thyroid functions Lab Results  Component Value Date   TSH 2.322 10/05/2017        Objective    BP 100/78   Pulse 94   Resp 16   Ht 5\' 3"  (1.6 m)   Wt 138 lb 8 oz (62.8 kg)   LMP 12/28/2022   SpO2 97%   BMI 24.53 kg/m  BP Readings from Last 3 Encounters:  02/01/23 100/78  01/13/23 101/60  01/15/22 117/68   Wt Readings from Last 3 Encounters:  02/01/23 138 lb 8 oz (62.8 kg)  01/09/23 125 lb (56.7 kg)  01/15/22 140 lb (63.5 kg)        Physical Exam  Physical Exam   GENERAL: Sad appearing but in no acute distress, not dehydrated. ABDOMEN: Diffuse abdominal tenderness, no distention, no masses palpated.       No results found for any visits on 02/01/23.  Assessment & Plan     Problem List Items Addressed This Visit       Digestive   C. difficile colitis - Primary    Acute, recurrent infection  Patient reports this as her third infection w/ C diff  Will prescribe an additional 5 days of 200mg  Fidaxomicin QD followed by 200mg  once daily every other day for 20 days  Referral submitted to Gastroenterology, [place] since it has been over a year since she has been seen at [facility]        Relevant Medications   fidaxomicin (DIFICID) 200 MG TABS tablet   Other Relevant Orders   Ambulatory referral to Gastroenterology   Ambulatory referral to Infectious Disease     Other   GAD (generalized anxiety disorder)    Anxiety exacerbated by marital stress and recent hospitalization. Reports difficulty eating and sleeping. Previously prescribed Xanax in the hospital, which provided relief. Short-term Xanax prescribed to manage acute symptoms. Discussed risks of benzodiazepine use, including dependency and sedation, and benefits of short-term relief. - Prescribe Xanax 0.25 mg twice daily as needed for 7 days - Continue  Protonix 40 mg daily      Relevant Medications   ALPRAZolam (XANAX) 0.25 MG tablet   Other Visit Diagnoses     Hospital discharge follow-up                No follow-ups on file.         Ronnald Ramp, MD  [facility] [phone] (phone) [phone] (fax)  [facility]

## 2023-02-01 NOTE — Assessment & Plan Note (Signed)
Anxiety exacerbated by marital stress and recent hospitalization. Reports difficulty eating and sleeping. Previously prescribed Xanax in the hospital, which provided relief. Short-term Xanax prescribed to manage acute symptoms. Discussed risks of benzodiazepine use, including dependency and sedation, and benefits of short-term relief. - Prescribe Xanax 0.25 mg twice daily as needed for 7 days - Continue Protonix 40 mg daily

## 2023-02-01 NOTE — Assessment & Plan Note (Addendum)
Acute, recurrent infection  Patient reports this as her third infection w/ C diff  Will prescribe an additional 5 days of 200mg  Fidaxomicin QD followed by 200mg  once daily every other day for 20 days  Referral submitted to Gastroenterology, [place] since it has been over a year since she has been seen at [facility]

## 2023-02-02 ENCOUNTER — Telehealth: Payer: Self-pay

## 2023-02-02 NOTE — Telephone Encounter (Signed)
Prior authorization submitted through Cover My Meds for Dificid 200 mg tabs.  Kathleen Owen (Key: [id number]) [id number] Dificid 200MG  tablets status: Question ResponseCreated: December 10th, 2024

## 2023-02-18 ENCOUNTER — Ambulatory Visit: Payer: Medicaid Other | Attending: Infectious Diseases | Admitting: Infectious Diseases

## 2023-02-18 VITALS — BP 132/83 | HR 76 | Temp 97.0°F | Ht 63.0 in | Wt 136.0 lb

## 2023-02-18 DIAGNOSIS — K5090 Crohn's disease, unspecified, without complications: Secondary | ICD-10-CM | POA: Diagnosis not present

## 2023-02-18 DIAGNOSIS — Z87891 Personal history of nicotine dependence: Secondary | ICD-10-CM | POA: Insufficient documentation

## 2023-02-18 DIAGNOSIS — A0471 Enterocolitis due to Clostridium difficile, recurrent: Secondary | ICD-10-CM | POA: Insufficient documentation

## 2023-02-18 DIAGNOSIS — B9689 Other specified bacterial agents as the cause of diseases classified elsewhere: Secondary | ICD-10-CM | POA: Insufficient documentation

## 2023-02-18 DIAGNOSIS — A0472 Enterocolitis due to Clostridium difficile, not specified as recurrent: Secondary | ICD-10-CM | POA: Diagnosis not present

## 2023-02-18 DIAGNOSIS — Z5986 Financial insecurity: Secondary | ICD-10-CM | POA: Diagnosis not present

## 2023-02-18 DIAGNOSIS — N760 Acute vaginitis: Secondary | ICD-10-CM | POA: Diagnosis not present

## 2023-02-18 NOTE — Progress Notes (Signed)
NAME: Kathleen Owen  DOB: Jul 08, 1990  MRN: [id number]  Date/Time: 02/18/2023 12:18 PM  Subjective:   ? Miss Benjiman Core, has a history of Crohn's disease, presents with recurrent C. diff infections. She has had three episodes of C. diff, the first of which occurred without prior antibiotic use. She continues to experience diarrhea, which has been ongoing since her third episode of C. diff. She took fidaxamicin recently in Nov 2024 She has ongoing diarrhea after completion of fidoxamicin- Her PCP has prescribed a 20 day course and is waiting on PA She has previously seen a gastroenterologist and had a colonoscopy a few years ago, which led to a diagnosis of Crohn's disease. She has been treated for Crohn's disease with steroids in the past but is not currently on any treatment.  She also has a history of bacterial vaginosis (BV), which has been treated multiple times. She recently had a positive test for BV but was unable to take the prescribed treatment due to a hospital admission for C. diff. She is concerned about starting antibiotics for BV due to the risk of triggering another C. diff infection.  She has lost 20 pounds and is unable to hold food down. She reports abdominal pain on both sides. 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 diff infections.  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   Bacterial vaginosis August 2016   recurrent BV   CD (Crohn's disease) (HCC) 07/30/2014   Crohn's disease (HCC) Jan 2016   Depression    Miscarriage 07/18/2014    Past Surgical History:  Procedure Laterality Date   DILATION AND CURETTAGE OF UTERUS     DILATION AND EVACUATION N/A 12/02/2016   Procedure: DILATATION AND EVACUATION;  Surgeon: Linzie Collin, MD;  Location: [facility] OR[facility];  Service: Gynecology;  Laterality: N/A;   DILATION AND EVACUATION N/A 06/30/2017   Procedure: DILATATION AND  EVACUATION;  Surgeon: Linzie Collin, MD;  Location: [facility] OR[facility];  Service: Gynecology;  Laterality: N/A;   DILATION AND EVACUATION N/A 12/06/2018   Procedure: SUCTION D&C;  Surgeon: Vena Austria, MD;  Location: [facility] OR[facility];  Service: Gynecology;  Laterality: N/A;   MYOMECTOMY N/A 04/06/2019   Procedure: MYOMECTOMY VIA LAPAROTOMY;  Surgeon: Vena Austria, MD;  Location: [facility] OR[facility];  Service: Gynecology;  Laterality: N/A;   OVARIAN CYST SURGERY  07/2022   TONSILLECTOMY      Social History   Socioeconomic History   Marital status: Married    Spouse name: Not on file   Number of children: Not on file   Years of education: Not on file   Highest education level: Not on file  Occupational History   Occupation: unemployed  Tobacco Use   Smoking status: Former    Types: Cigars   Smokeless tobacco: Never   Tobacco comments:    black and mild 2-3 cigars a day  Vaping Use   Vaping status: Every Day  Substance and Sexual Activity   Alcohol use: Yes    Alcohol/week: 2.0 standard drinks of alcohol    Types: 2 Cans of beer per week    Comment: 1 x month   Drug use: Not Currently    Types: Marijuana    Comment: last use age 52   Sexual activity: Yes    Partners: Male    Birth control/protection: None  Other Topics Concern   Not on file  Social History Narrative   Not on file   Social Drivers of Health   Financial Resource Strain: High Risk (10/05/2017)   Overall Financial Resource Strain (CARDIA)    Difficulty of Paying Living Expenses: Very hard  Food Insecurity: No Food Insecurity (01/10/2023)   Hunger Vital Sign    Worried About Running Out of Food in the Last Year: Never true    Ran Out of Food in the Last Year: Never true  Transportation Needs: No Transportation Needs (01/10/2023)   PRAPARE - Administrator, Civil Service (Medical): No    Lack of Transportation (Non-Medical): No  Physical Activity: Unknown (10/05/2017)   Exercise Vital Sign    Days of  Exercise per Week: Patient declined    Minutes of Exercise per Session: Patient declined  Stress: No Stress Concern Present (10/05/2017)   [employer] of Occupational Health - Occupational Stress Questionnaire    Feeling of Stress : Only a little  Social Connections: Unknown (10/05/2017)   Social Connection and Isolation Panel [NHANES]    Frequency of Communication with Friends and Family: Patient declined    Frequency of Social Gatherings with Friends and Family: Patient declined    Attends Religious Services: Patient declined    Database administrator or Organizations: Patient declined    Attends Banker Meetings: Patient declined    Marital Status: Patient declined  Intimate Partner Violence: Not At Risk (01/10/2023)   Humiliation, Afraid, Rape, and Kick questionnaire    Fear of Current or Ex-Partner: No    Emotionally Abused: No    Physically Abused: No    Sexually Abused: No    Family History  Problem Relation Age of Onset   Healthy Mother    Healthy Father    Hypertension Father    Kidney disease Maternal Uncle    Heart failure Maternal Uncle    Thyroid disease Paternal Aunt    Heart failure Maternal Grandmother    Hypertension Paternal Grandmother    Cancer Paternal Grandmother    Cancer Paternal Grandfather    Mental illness Neg Hx    Allergies  Allergen Reactions   Aspirin Other (See Comments)    Chron's Disease Chron's Disease Other reaction(s): Other (See Comments) Chron's Disease   Ibuprofen Other (See Comments)    Chron's Disease Chron's Disease Other reaction(s): Other (See Comments), Other (See Comments) Chron's Disease "blow flare" per pt   Amoxicillin Rash and Other (See Comments)    Has patient had a PCN reaction causing immediate rash, facial/tongue/throat swelling, SOB or lightheadedness with hypotension: Unknown Has patient had a PCN reaction causing severe rash involving mucus membranes or skin necrosis: Unknown Has patient had  a PCN reaction that required hospitalization: Unknown Has patient had a PCN reaction occurring within the last 10 years: No If all of the above answers are "NO", then may proceed with Cephalosporin use.  Other reaction(s): Other (See Comments) Has patient had a PCN reaction causing immediate rash, facial/tongue/throat swelling, SOB or lightheadedness with hypotension: Unknown Has patient had a PCN reaction causing severe rash involving mucus membranes or skin necrosis: Unknown Has patient had a PCN reaction that required hospitalization: Unknown Has patient had a PCN reaction occurring within the last 10 years: No If all of the above answers are "NO", then may proceed with Cephalosporin use.   I? Current Outpatient Medications  Medication Sig Dispense Refill   acetaminophen (TYLENOL) 500 MG tablet Take 500 mg by mouth every 4 (four) hours  as needed.     ALPRAZolam (XANAX) 0.25 MG tablet Take 1 tablet (0.25 mg total) by mouth 2 (two) times daily as needed for anxiety. 30 tablet 0   ondansetron (ZOFRAN-ODT) 4 MG disintegrating tablet Take 1 tablet (4 mg total) by mouth every 8 (eight) hours as needed for nausea or vomiting. 20 tablet 0   fidaxomicin (DIFICID) 200 MG TABS tablet Take 1 tablet (200 mg total) by mouth 2 (two) times daily for 5 days, THEN 1 tablet (200 mg total) every other day for 20 days. (Patient not taking: Reported on 02/18/2023) 25 tablet 0   No current facility-administered medications for this visit.     Abtx:  Anti-infectives (From admission, onward)    None       REVIEW OF SYSTEMS:  Const: negative fever, negative chills, negative weight loss Eyes: negative diplopia or visual changes, negative eye pain ENT: negative coryza, negative sore throat Resp: negative cough, hemoptysis, dyspnea Cards: negative for chest pain, palpitations, lower extremity edema GU: negative for frequency, dysuria and hematuria GI:  diarrhea, abd pain Skin: negative for rash and  pruritus Heme: negative for easy bruising and gum/nose bleeding MS: negative for myalgias, arthralgias, back pain and muscle weakness Neurolo:negative for headaches, dizziness, vertigo, memory problems  Psych: negative for feelings of anxiety, depression  Endocrine: negative for thyroid, diabetes Allergy/Immunology- as above: Objective:  VITALS:  BP 132/83   Pulse 76   Temp (!) 97 F (36.1 C) (Temporal)   Ht 5\' 3"  (1.6 m)   Wt 136 lb (61.7 kg)   BMI 24.09 kg/m   PHYSICAL EXAM:  General: Alert, cooperative, no distress, appears stated age.  Head: Normocephalic, without obvious abnormality, atraumatic. Eyes: Conjunctivae clear, anicteric sclerae. Pupils are equal ENT Nares normal. No drainage or sinus tenderness. Lips, mucosa, and tongue normal. No Thrush Neck: Supple, symmetrical, no adenopathy, thyroid: non tender no carotid bruit and no JVD. Back: No CVA tenderness. Lungs: Clear to auscultation bilaterally. No Wheezing or Rhonchi. No rales. Heart: Regular rate and rhythm, no murmur, rub or gallop. Abdomen: Soft, minimal tenderness to plapation Extremities: atraumatic, no cyanosis. No edema. No clubbing Skin: No rashes or lesions. Or bruising Lymph: Cervical, supraclavicular normal. Neurologic: Grossly non-focal Pertinent Labs LABS C. diff antigen: positive (2019-05) C. diff toxin: negative (2019-05) C. diff antigen: positive (2019-08) C. diff toxin: negative (2019-08)  C. diff antigen: positive (2024) C. diff toxin: positive (2024)  ? Impression/Recommendation ?Recurrent Clostridium difficile (C. diff) Infection Patient has had three episodes of C. diff, with the most recent episode having both antigen and toxin positive. The patient has ongoing diarrhea. Discussed the possibility of C. diff colonization due to underlying Crohn's disease. Also discussed the potential need for fecal microbiota transplantation (FMT) if recurrent C. diff continues.  -Contact primary  care doctor to inquire about the status of prior authorization for fidaxomicin. -If fidaxomicin is not approved, consider vancomycin. -Consider referral to gastroenterology for potential FMT if recurrent C. diff continues.  Crohn's Disease Patient has a history of Crohn's disease but has not been on treatment recently. The patient has ongoing diarrhea, which may be related to both the Crohn's disease and the C. diff infection. -Refer to gastroenterology for re-evaluation of Crohn's disease status and potential treatment.  Bacterial Vaginosis (BV) Patient has a history of recurrent BV. Discussed the potential use of metronidazole for treatment, which would not exacerbate C. diff infection.   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  Follow PRN

## 2023-03-01 ENCOUNTER — Other Ambulatory Visit: Payer: Self-pay | Admitting: Family Medicine

## 2023-03-01 ENCOUNTER — Other Ambulatory Visit: Payer: Self-pay

## 2023-03-01 ENCOUNTER — Telehealth: Payer: Self-pay

## 2023-03-01 MED ORDER — DIFICID 200 MG PO TABS
200.0000 mg | ORAL_TABLET | Freq: Two times a day (BID) | ORAL | 0 refills | Status: AC
  Filled 2023-03-01 (×3): qty 12, 6d supply, fill #0

## 2023-03-01 NOTE — Telephone Encounter (Signed)
 Recieved a fax from covermymeds for Dificid 200mg  Tablets PA has been started   key: [id number]

## 2023-03-01 NOTE — Telephone Encounter (Signed)
 Prescription request was approved today 03/01/23 for fidaxomicin 200mg  twice daily at [facility]

## 2023-03-01 NOTE — Telephone Encounter (Signed)
 Copied from CRM [id number]. Topic: General - Other >> Mar 01, 2023 10:45 AM Missy HERO wrote: Reason for CRM: Pt reports that she needs an approval for the Rx for fidaxomicin  (DIFICID ) 200 MG TABS tablet to be sent to [facility]

## 2023-03-03 NOTE — Telephone Encounter (Signed)
 Yes, please defer treatment and prescription management for recurrent c diff to patient's GI office

## 2023-04-05 ENCOUNTER — Encounter: Payer: Self-pay | Admitting: Family Medicine

## 2023-04-05 ENCOUNTER — Ambulatory Visit: Payer: Self-pay | Admitting: Family Medicine

## 2023-04-05 VITALS — Temp 98.0°F

## 2023-04-05 DIAGNOSIS — Z113 Encounter for screening for infections with a predominantly sexual mode of transmission: Secondary | ICD-10-CM

## 2023-04-05 DIAGNOSIS — A6000 Herpesviral infection of urogenital system, unspecified: Secondary | ICD-10-CM

## 2023-04-05 DIAGNOSIS — N739 Female pelvic inflammatory disease, unspecified: Secondary | ICD-10-CM

## 2023-04-05 LAB — WET PREP FOR TRICH, YEAST, CLUE: Trichomonas Exam: NEGATIVE

## 2023-04-05 LAB — HM HIV SCREENING LAB: HM HIV Screening: NEGATIVE

## 2023-04-05 MED ORDER — METRONIDAZOLE 0.75 % EX GEL: 1.0000 | Freq: Every day | CUTANEOUS | Status: AC

## 2023-04-05 MED ORDER — CEFTRIAXONE SODIUM 500 MG IJ SOLR
500.0000 mg | Freq: Once | INTRAMUSCULAR | Status: AC
  Administered 2023-04-05: 500 mg via INTRAMUSCULAR

## 2023-04-05 MED ORDER — AZITHROMYCIN 500 MG PO TABS: 1000.0000 mg | ORAL_TABLET | Freq: Every day | ORAL | Status: DC

## 2023-04-05 MED ORDER — ACYCLOVIR 400 MG PO TABS: 400.0000 mg | ORAL_TABLET | Freq: Three times a day (TID) | ORAL | Status: AC

## 2023-04-05 NOTE — Progress Notes (Signed)
 Pt here for STI screening.  Has multiple symptoms.  Wet mount results reviewed with patient.  Per orders, Rocephin  500mg  IM given in RUOQ without complications.  Azithromycin  500mg  (1 gram) and Metrogel  Vaginal Gel dispensed to patient.  Counseled on medications, side effects, plan of care and when to contact clinic with questions or concerns.  Condoms given.  Contact card x 1 given.-Laneisha Mino, RN

## 2023-04-05 NOTE — Progress Notes (Signed)
 [facility] Department STI clinic [address], [address] [place] [address] Main phone: [phone]  STI screening visit  Subjective:  Kathleen Owen is a 33 y.o. female being seen today for an STI screening visit. The patient reports they do have symptoms.  Patient reports that they do desire a pregnancy in the next year.   They reported they are not interested in discussing contraception today.    Patient's last menstrual period was 03/26/2023.  Patient has the following medical conditions:  Patient Active Problem List   Diagnosis Date Noted   C. 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/2016   GAD (generalized anxiety disorder) 09/17/2015   Major depressive disorder, single episode, moderate (HCC) 09/17/2015   Smoker 11/22/2014   Recurrent vaginitis 11/22/2014   Airway hyperreactivity 07/30/2014   CD (Crohn's disease) (HCC) 07/30/2014   Body mass index (BMI) of 27.0-27.9 in adult 10/03/2013    Chief Complaint  Patient presents with   SEXUALLY TRANSMITTED DISEASE    STI screening-burning sensation, vaginal itching, pain with intercourse, some bleeding, yellowish-cream discharge    HPI HPI Patient reports  to clinic with c/o vaginal itching, discharge, and pelvic pain that has been going on since December. Reports that she was seen and given tx for yeast, however it hasn't gotten better. Reports burning near her vagina.  Does the patient using douching products? No  Last HIV test per patient/review of record was  Lab Results  Component Value Date   HMHIVSCREEN  Negative - Validated 07/01/2020    Lab Results  Component Value Date   HIV Non Reactive 11/22/2018     Last HEPC test per patient/review of record was No results found for: "HMHEPCSCREEN" No components found for: "HEPC"   Last HEPB test per patient/review of record was No components found for: "HMHEPBSCREEN"   Patient reports last pap was:     Component Value Date/Time   DIAGPAP  10/30/2020 0914    - Negative for intraepithelial lesion or malignancy (NILM)   DIAGPAP  11/22/2018 1022    - Negative for intraepithelial lesion or malignancy (NILM)   HPVHIGH Negative 10/30/2020 0914   ADEQPAP  10/30/2020 0914    Satisfactory for evaluation; transformation zone component PRESENT.   ADEQPAP Satisfactory for evaluation.  The absence of an 11/22/2018 1022   ADEQPAP  11/22/2018 1022    endocervical/transformation zone component is not uncommon in pregnant   ADEQPAP patients. 11/22/2018 1022   No results found for: "SPECADGYN"   Screening for MPX risk: Does the patient have an unexplained rash? No Is the patient MSM? No Does the patient endorse multiple sex partners or anonymous sex partners? No Did the patient have close or sexual contact with a person diagnosed with MPX? No Has the patient traveled outside the [place]  where MPX is endemic? No Is there a high clinical suspicion for MPX-- evidenced by one of the following No  -Unlikely to be chickenpox  -Lymphadenopathy  -Rash that present in same phase of evolution on any given body part See flowsheet for further details and programmatic requirements.   Immunization history:  Immunization History  Administered Date(s) Administered   DTP 05/13/1990, 07/22/1990, 09/23/1990, 09/01/1991   DTaP 06/07/1995   HIB (PRP-OMP) 05/13/1990, 07/22/1990, 09/23/1990, 09/01/1991   HPV Quadrivalent 04/24/2008   Hep B, Unspecified 11/16/2001, 01/06/2002, 06/16/2002   Hpv-Unspecified 04/18/2007, 06/16/2007   MMR 09/01/1991, 06/07/1995   OPV  05/13/1990, 07/22/1990, 09/01/1991, 06/07/1995   Tdap 04/24/2008     The following portions of the patient's history were reviewed and updated as appropriate: allergies, current medications, past medical history, past social history, past surgical history and problem list.  Objective:  There were no vitals filed for this visit.  Physical Exam Vitals and nursing note reviewed.  Constitutional:      Appearance: Normal appearance.  HENT:     Head: Normocephalic and atraumatic.     Mouth/Throat:     Mouth: Mucous membranes are moist.     Pharynx: Oropharynx is clear. No oropharyngeal exudate or posterior oropharyngeal erythema.  Pulmonary:     Effort: Pulmonary effort is normal.  Abdominal:     General: Abdomen is flat.     Palpations: There is no mass.     Tenderness: There is no abdominal tenderness. There is no rebound.  Genitourinary:    Exam position: Lithotomy position.     Pubic Area: No rash or pubic lice.      Labia:        Right: No rash or lesion.        Left: No rash or lesion.      Vagina: Vaginal discharge present. No erythema, bleeding or lesions.     Cervix: Cervical motion tenderness present. No discharge, friability, lesion or erythema.     Uterus: Normal.      Adnexa: Right adnexa normal and left adnexa normal.     Rectum: Normal.       Comments: pH = 4  Cluster of lesions in red circle, erythematous, pruritic and painful- about 5-6 lesions, approx 2-3 mm in size each Lymphadenopathy:     Head:     Right side of head: No preauricular or posterior auricular adenopathy.     Left side of head: No preauricular or posterior auricular adenopathy.     Cervical: No cervical adenopathy.     Upper Body:     Right upper body: No supraclavicular, axillary or epitrochlear adenopathy.     Left upper body: No supraclavicular, axillary or epitrochlear adenopathy.     Lower Body: No right inguinal adenopathy. No left inguinal adenopathy.  Skin:    General: Skin is warm  and dry.     Findings: No rash.  Neurological:     Mental Status: She is alert and oriented to person, place, and time.     Assessment and Plan:  Kathleen Owen is a 33 y.o. female presenting to the [facility] Department for STI screening  1. Screening for venereal disease (Primary)  - Chlamydia/Gonorrhea [facility] - HIV [place] [facility] - Syphilis Serology, [facility] - WET PREP FOR TRICH, YEAST, CLUE  2. PID (pelvic inflammatory disease) -treated today based on CMT and symptoms -has a hx of PID- last tx in 2022 per chart review -pt reports she had a period in Jan, but it was short and she could be pregnant  - cefTRIAXone  (ROCEPHIN ) injection 500 mg - azithromycin  (ZITHROMAX ) 500 MG tablet; Take 2 tablets (1,000 mg total) by mouth daily. - metroNIDAZOLE  (METROGEL ) 0.75 % gel; Apply 1 Application topically at bedtime for 5 days.  3. Genital herpes  simplex, unspecified site -pattern of small raised lesions on left labia- consistent with HSV -pt reports recent testing for HSV but considering the presentation today, I recommended swabbing of the lesions and treatment  - Virology, [place] [facility] - acyclovir  (ZOVIRAX ) 400 MG tablet; Take 1 tablet (400 mg total) by mouth 3 (three) times daily for 10 days.   Patient accepted all screenings including vaginal CT/GC and bloodwork for HIV/RPR, and wet prep. Patient meets criteria for HepB screening? No. Ordered? not applicable Patient meets criteria for HepC screening? No. Ordered? not applicable  Treat wet prep per standing order Discussed time line for State Lab results and that patient will be called with positive results and encouraged patient to call if she had not heard in 2 weeks.  Counseled to return or seek care for continued or worsening symptoms Recommended repeat testing in 3 months with positive results. Recommended condom use with all sex for STI prevention.   Patient is currently using  nothing  to prevent  pregnancy.    Return if symptoms worsen or fail to improve, for STI screening.  No future appointments.  Earleen Glazier, [place]

## 2023-06-08 ENCOUNTER — Ambulatory Visit: Admitting: Nurse Practitioner

## 2023-06-08 ENCOUNTER — Encounter: Payer: Self-pay | Admitting: Nurse Practitioner

## 2023-06-08 DIAGNOSIS — R102 Pelvic and perineal pain: Secondary | ICD-10-CM

## 2023-06-08 DIAGNOSIS — Z113 Encounter for screening for infections with a predominantly sexual mode of transmission: Secondary | ICD-10-CM | POA: Diagnosis not present

## 2023-06-08 DIAGNOSIS — B3731 Acute candidiasis of vulva and vagina: Secondary | ICD-10-CM

## 2023-06-08 LAB — HEPATITIS B SURFACE ANTIGEN

## 2023-06-08 LAB — WET PREP FOR TRICH, YEAST, CLUE: Trichomonas Exam: NEGATIVE

## 2023-06-08 LAB — HM HIV SCREENING LAB: HM HIV Screening: NEGATIVE

## 2023-06-08 LAB — HM HEPATITIS C SCREENING LAB: HM Hepatitis Screen: NEGATIVE

## 2023-06-08 MED ORDER — FLUCONAZOLE 150 MG PO TABS: 150.0000 mg | ORAL_TABLET | ORAL | 0 refills | Status: DC

## 2023-06-08 MED ORDER — CLOTRIMAZOLE 1 % VA CREA: 1.0000 | TOPICAL_CREAM | Freq: Every day | VAGINAL | Status: AC

## 2023-06-08 NOTE — Progress Notes (Signed)
 Pt here for STI screening.  Does have symptoms.  Wet mount results reviewed with patient.  Results positive for few yeast.  Clotrimazole 1% Vaginal cream 1bx dispensed to patient.  Counseled and patient verbalizes understanding.  Rx for oral medication for yeast sent to patient's desired pharmacy.  Condoms declined-Brayden Betters, RN

## 2023-06-08 NOTE — Progress Notes (Signed)
 [facility] Department STI clinic [address] [place] [address] Main phone: [phone]  STI screening visit  Subjective:  Kathleen Owen is a 33 y.o. female being seen today for an STI screening visit. The patient reports they do have symptoms.  Patient reported pregnancy desire in the next year not assessed.   They reported they are not interested in discussing contraception today.    Patient's last menstrual period was 05/28/2023.  Patient has the following medical conditions:  Patient Active Problem List   Diagnosis Date Noted   C. 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/2016   GAD (generalized anxiety disorder) 09/17/2015   Major depressive disorder, single episode, moderate (HCC) 09/17/2015   Smoker 11/22/2014   Recurrent vaginitis 11/22/2014   Airway hyperreactivity 07/30/2014   CD (Crohn's disease) (HCC) 07/30/2014   Body mass index (BMI) of 27.0-27.9 in adult 10/03/2013   Chief Complaint  Patient presents with   SEXUALLY TRANSMITTED DISEASE    Pt is here STD screening and having symptoms   Patient is a pleasant 33 y.o. female who presents to the office today requesting symptomatic STI testing.  Patient reports symptoms include genital itching since December. Reports after last visit that slightly resolved but has returned. Also reports minimal lower abdominal pain, pain with sex, swollen labia, irritation to vagina from scratching, increased white, clumpy, non-odorous vaginal discharge, and urethral burning with urination (not skin burning), a feeling of  inability to empty bladder, mild low back pain, and some nausea. She denies fever, chills, or body aches.  Patient reports that after her last visit she used organic toilet paper and the symptoms may have come back after switching back to regular toilet paper. She reports also taking 2 Clorox baths per week for eczema relief.  Patient indicates 1 female partner in the last 2 months. She reports practicing vaginal sex and uses condoms sometimes. Patient indicates STI history of Gonorrhea in 2022. Patient reports last sex was prior to/or in early February. She indicates no use of contraception method.  Patient indicates LMP was 05/28/23.  Of note, patient has extensive history with pelvic pain and vaginosis. At her most recent visit with this clinic in February 2025 (about 2 months ago) the patient had similar symptoms and was treated for PID (allergy to Amoxicillin so alternative used).    Last HIV test per patient/review of record was  Lab Results  Component Value Date   HMHIVSCREEN Negative - Validated 04/05/2023    Lab Results  Component Value Date   HIV Non Reactive 11/22/2018     Last HEPC test per patient/review of record was No results found for: "HMHEPCSCREEN" No components found for: "HEPC"   Last HEPB test per patient/review of record was No components found for: "HMHEPBSCREEN"   Patient reports last pap was:      Component Value Date/Time   DIAGPAP  10/30/2020 0914    - Negative for intraepithelial lesion or malignancy (NILM)   DIAGPAP  11/22/2018 1022    - Negative for intraepithelial lesion or malignancy (NILM)   HPVHIGH Negative 10/30/2020 0914   ADEQPAP  10/30/2020 0914    Satisfactory for evaluation; transformation zone component PRESENT.   ADEQPAP Satisfactory for evaluation.  The absence of an 11/22/2018 1022   ADEQPAP  11/22/2018 1022    endocervical/transformation zone component is not uncommon in pregnant   ADEQPAP patients. 11/22/2018 1022   Screening for MPX  risk: Does the patient have an unexplained rash? No Is the patient MSM? No Does the patient endorse multiple sex partners or anonymous sex partners? No Did the patient have close or sexual contact with a person diagnosed with MPX? No Has the patient traveled outside the [place] where MPX is endemic? No Is there a high clinical suspicion for MPX-- evidenced by one of the following No  -Unlikely to be chickenpox  -Lymphadenopathy  -Rash that present in same phase of evolution on any given body part See flowsheet for further details and programmatic requirements.   Immunization history:  Immunization History  Administered Date(s) Administered   DTP 05/13/1990, 07/22/1990, 09/23/1990, 09/01/1991   DTaP 06/07/1995   HIB (PRP-OMP) 05/13/1990, 07/22/1990, 09/23/1990, 09/01/1991   HPV Quadrivalent 04/24/2008   Hep B, Unspecified 11/16/2001, 01/06/2002, 06/16/2002   Hpv-Unspecified 04/18/2007, 06/16/2007   MMR 09/01/1991, 06/07/1995   OPV 05/13/1990, 07/22/1990, 09/01/1991, 06/07/1995   Tdap 04/24/2008     The following portions of the patient's history were reviewed and updated as appropriate: allergies, current medications, past medical history, past social history, past surgical history and problem list.  Objective:  There were no vitals filed for this visit.  Physical Exam Nursing note reviewed. Exam conducted with a chaperone present Bradd Canary., RN present as chaperone.).  Constitutional:      Appearance: Normal appearance.  HENT:     Head: Normocephalic.     Salivary Glands: Right salivary gland is not diffusely enlarged or tender. Left salivary gland is not diffusely enlarged or tender.     Mouth/Throat:     Lips: Pink. No lesions.     Mouth: Mucous membranes are moist.     Tongue: No lesions. Tongue does not deviate from midline.     Pharynx: Oropharynx is clear. Uvula midline.     Tonsils: No tonsillar exudate.  Eyes:     General:        Right eye: No discharge.        Left  eye: No discharge.  Pulmonary:     Effort: Pulmonary effort is normal.  Genitourinary:    General: Normal vulva.     Exam position: Lithotomy position.     Pubic Area: No rash or pubic lice.      Tanner stage (genital): 5.     Labia:        Right: Tenderness present. No rash, lesion or injury.        Left: Tenderness present. No rash, lesion or injury.      Vagina: Vaginal discharge present. No erythema, tenderness, bleeding or lesions.     Cervix: Discharge present. No cervical motion tenderness, friability, lesion, erythema, cervical bleeding or eversion.     Uterus: Normal.      Adnexa: Right adnexa normal and left adnexa normal.     Comments: pH<4.5 Labia mildly inflamed with erythema and edematous.  Copious amounts of non-odorous, white/yellow, clumpy and thin discharge present on outside of vagina and within vaginal canal. Moderate amount of clear vaginal discharge from cervical os. No CMT or adnexal pain on palpation.    Lymphadenopathy:     Head:     Right side of head:  No submental, submandibular, tonsillar, preauricular or posterior auricular adenopathy.     Left side of head: No submental, submandibular, tonsillar, preauricular or posterior auricular adenopathy.     Cervical: No cervical adenopathy.     Right cervical: No superficial or posterior cervical adenopathy.    Left cervical: No superficial or posterior cervical adenopathy.     Upper Body:     Right upper body: No supraclavicular or axillary adenopathy.     Left upper body: No supraclavicular or axillary adenopathy.     Lower Body: No right inguinal adenopathy. No left inguinal adenopathy.  Skin:    General: Skin is warm and dry.     Findings: No lesion or rash.     Comments: Skin tone appropriate for ethnicity.   Neurological:     Mental Status: She is alert and oriented to person, place, and time.  Psychiatric:        Attention and Perception: Attention and perception normal.        Mood and Affect: Mood  and affect normal.        Speech: Speech normal.        Behavior: Behavior normal. Behavior is cooperative.        Thought Content: Thought content normal.     Assessment and Plan:  Kathleen Owen is a 33 y.o. female presenting to the [facility] Department for STI screening  1. Screening for venereal disease (Primary) Wet prep positive for few yeast.  Testing again for gonorrhea and chlamydia on vaginal swab on the chance most recent testing was collected too soon after exposure and was a false negative. Patient was treated, but there is concern treatment may have been inadequate.  - Chlamydia/Gonorrhea [place] [facility] - HBV Antigen/[facility]ibody [facility] - HIV/HCV [facility] [facility] - WET PREP FOR TRICH, YEAST, CLUE  2. Vulvovaginal candidiasis With patient reported symptoms and vaginal discharge present on PE, and yeast on wet prep in office today, treating patient with oral and vaginal medications for yeast.   Regarding general vaginal health, advised patient to stop taking baths. She reports taking Clorox baths about twice per week for eczema. Advised the Clorox could be the cause of her irritation and baths in general are usually avoided to prevent vaginal concerns. Encouraged to sleep without undergarments on and to only wear cotton undergarments. Advised if the organic toilet paper seems to be more gentle she could use that as she did in the past. Discussed continuing probiotics and trying boric acid vaginal suppositories.   - fluconazole (DIFLUCAN) 150 MG tablet; Take 1 tablet (150 mg total) by mouth every 3 (three) days.  Dispense: 2 tablet; Refill: 0 - clotrimazole (GYNE-LOTRIMIN) 1 % vaginal cream; Place 1 Applicatorful vaginally at bedtime for 7 days.  3. Pelvic pain Advised patient at this time if her STI testing is negative and antifungal treatment does not help she should return to her OBGYN or PCP for further evaluation related to her abdominal pain. Advised this may be  related to cysts or uterine fibroid, both of which patient has history of. Also advised it could be due to her Crohn's diagnosis and she may need an evaluation by GI.   Patient verbalized understanding and agreed with plan.   Patient accepted screenings including vaginal CT/GC and bloodwork for HIV/HBV/HCV, and wet prep. Patient meets criteria for HepB screening? Yes. Ordered? yes Patient meets criteria for HepC screening? Yes. Ordered? yes  Treat wet prep per standing order Discussed time  line for State Lab results and that patient will be called with positive results and encouraged patient to call if she had not heard in 2 weeks.  Counseled to return or seek care for continued or worsening symptoms Recommended repeat testing in 3 months with positive results. Recommended condom use with all sex for STI prevention.   Patient is currently using  no method  to prevent pregnancy.    Return if symptoms worsen or fail to improve.  No future appointments.  Total time with patient 30 minutes.   Merleen Stare, NP

## 2023-08-09 ENCOUNTER — Ambulatory Visit: Payer: Self-pay

## 2023-08-09 NOTE — Telephone Encounter (Signed)
 FYI Only or Action Required?: FYI only for provider  Patient was last seen in primary care on 02/01/2023 by Mimi Alt, MD. Called Nurse Triage reporting Rash. Symptoms began several weeks ago. Interventions attempted: Nothing. Symptoms are: unchanged.  Triage Disposition: see PCP within 24-72 hours.   Patient/caregiver understands and will follow disposition?: Yes   Copied from CRM [id number]. Topic: Clinical - Red Word Triage >> Aug 09, 2023  1:45 PM Ivette P wrote: [facility] that prompted transfer to Nurse Triage: - broke out on back and coming to chest, allergic reaction or irriatating skin, Itching, red bump painful when scratching. No pain throughout day.   - been for a few weeks, and started to spread to chest Reason for Disposition  SEVERE itching (i.e., interferes with sleep, normal activities or school)  Answer Assessment - Initial Assessment Questions 1. APPEARANCE of RASH: Describe the rash. (e.g., spots, blisters, raised areas, skin peeling, scaly)     Raised red bumps, itch 2. SIZE: How big are the spots? (e.g., tip of pen, eraser, coin; inches, centimeters)     Tip of pen 3. LOCATION: Where is the rash located?     Back and spreading to chest 4. COLOR: What color is the rash? (Note: It is difficult to assess rash color in people with darker-colored skin. When this situation occurs, simply ask the caller to describe what they see.)     red 5. ONSET: When did the rash begin?     Couple weeks ago 6. FEVER: Do you have a fever? If Yes, ask: What is your temperature, how was it measured, and when did it start?     no 7. ITCHING: Does the rash itch? If Yes, ask: How bad is the itch? (Scale 1-10; or mild, moderate, severe)     Mild to severe 8. CAUSE: What do you think is causing the rash?     unknown 9. MEDICINE FACTORS: Have you started any new medicines within the last 2 weeks? (e.g., antibiotics)      no 10. OTHER SYMPTOMS: Do you have  any other symptoms? (e.g., dizziness, headache, sore throat, joint pain)       no 11. PREGNANCY: Is there any chance you are pregnant? When was your last menstrual period?       na  Protocols used: Rash or Redness - [facility]

## 2023-08-11 ENCOUNTER — Encounter: Payer: Self-pay | Admitting: Family Medicine

## 2023-08-11 ENCOUNTER — Ambulatory Visit (INDEPENDENT_AMBULATORY_CARE_PROVIDER_SITE_OTHER): Admitting: Family Medicine

## 2023-08-11 VITALS — BP 92/68 | HR 80 | Ht 63.0 in | Wt 135.0 lb

## 2023-08-11 DIAGNOSIS — L700 Acne vulgaris: Secondary | ICD-10-CM

## 2023-08-11 DIAGNOSIS — R5382 Chronic fatigue, unspecified: Secondary | ICD-10-CM | POA: Diagnosis not present

## 2023-08-11 MED ORDER — BENZOYL PEROXIDE WASH 5 % EX LIQD
Freq: Two times a day (BID) | CUTANEOUS | 12 refills | Status: DC
Start: 2023-08-11 — End: 2023-12-07

## 2023-08-11 MED ORDER — DOXYCYCLINE HYCLATE 100 MG PO TABS: 100.0000 mg | ORAL_TABLET | Freq: Two times a day (BID) | ORAL | 0 refills | Status: DC

## 2023-08-11 NOTE — Progress Notes (Addendum)
 ACUTE VISIT   Patient: Kathleen Owen   DOB: [birth year]   33 y.o. Female  MRN: [id number]   PCP: Mimi Alt, MD  Chief Complaint  Patient presents with  . Rash    Red itchy bumps on her back and moving to the front this has been going on for a couple weeks    Subjective    HPI HPI     Rash    Additional comments: Red itchy bumps on her back and moving to the front this has been going on for a couple weeks       Last edited by Bart Lieu, CMA on 08/11/2023  4:13 PM.       Discussed the use of AI scribe software for clinical note transcription with the patient, who gave verbal consent to proceed.  History of Present Illness Kathleen Owen is a 33 year old female who presents with red itchy bumps on her back and chest.  She has developed red, itchy papules that began on her shoulder and have spread to her back and chest over the past couple of weeks. The lesions are not present on her legs, neck, or face. The pruritus is significant, particularly nocturnal, affecting her sleep. She falls asleep easily due to fatigue but is disturbed by the itching once in bed.  She has not changed her soap or body wash, continuing to use Dove and a Scenic body wash, which she has used since high school. She recently changed her shower head to one with filter beads, which is the only change she can recall. She is unsure if any new clothing or bra changes could be contributing to the issue. No new medications, supplements, or changes in detergent. No new perfumes or lotions. She does not use an exfoliating scrub on her back, only a regular washcloth, and is cautious about touching her back due to fear of aggravating the condition.  She is concerned about low iron levels, as she feels fatigued and her mother has suggested this could be related. She reports no energy and dark circles under her eyes. No menorrhagia but acknowledges feeling tired often. She has not noticed  any hormonal breakouts or other skin changes.  No systemic symptoms such as fever or malaise. Absence of similar symptoms in other household members.     Medications: Outpatient Medications Prior to Visit  Medication Sig  . acetaminophen  (TYLENOL ) 500 MG tablet Take 500 mg by mouth every 4 (four) hours as needed. (Patient not taking: Reported on 08/11/2023)  . ALPRAZolam  (XANAX ) 0.25 MG tablet Take 1 tablet (0.25 mg total) by mouth 2 (two) times daily as needed for anxiety. (Patient not taking: Reported on 08/11/2023)  . azithromycin  (ZITHROMAX ) 500 MG tablet Take 2 tablets (1,000 mg total) by mouth daily. (Patient not taking: Reported on 08/11/2023)  . fluconazole  (DIFLUCAN ) 150 MG tablet Take 1 tablet (150 mg total) by mouth every 3 (three) days. (Patient not taking: Reported on 08/11/2023)  . ondansetron  (ZOFRAN -ODT) 4 MG disintegrating tablet Take 1 tablet (4 mg total) by mouth every 8 (eight) hours as needed for nausea or vomiting. (Patient not taking: Reported on 08/11/2023)   No facility-administered medications prior to visit.   Lab Results  Component Value Date   WBC 8.1 01/11/2023   HGB 11.4 (L) 01/11/2023   HCT 34.5 (L) 01/11/2023   MCV 93.0 01/11/2023   PLT 174 01/11/2023         Objective  BP 92/68   Pulse 80   Ht 5' 3 (1.6 m)   Wt 135 lb (61.2 kg)   SpO2 100%   BMI 23.91 kg/m    Physical Exam   Physical Exam SKIN: Comedones and non-inflamed papules on the back, no cystic lesions. Comedones on the forehead, bilateral cheeks, extending to the upper neck.   No results found for any visits on 08/11/23.  Assessment & Plan     Assessment and Plan Assessment & Plan Acneiform Eruption Red, itchy papules on back and chest, following bra pattern, suggest contact dermatitis or acneiform eruption. No lesions on legs, neck, or face. No new medications, supplements, or detergent changes. No similar symptoms previously and no household contacts affected. Pityriasis  ruled out. Condition may be related to clogged pores or dead skin accumulation, possibly exacerbated by conditioner residue. Doxycycline  prescribed with caution due to increased sun sensitivity and risk of sunburn. - Prescribe benzoyl peroxide 5% solution twice daily, focusing on the back, especially at night. - Advise use of a [place] exfoliating cloth to scrub her back to help remove dead skin and unclog pores. - Prescribe doxycycline  100 mg twice daily, with caution to avoid sun exposure due to increased sensitivity and risk of sunburn.  Fatigue Persistent fatigue and dark circles under eyes. Concerned about low iron levels. Considers anemia or other deficiencies. Denies heavy periods or symptoms of significant blood loss. Will evaluate for anemia, iron deficiency, vitamin deficiencies, and thyroid function. - Order CBC to evaluate for anemia. - Order ferritin level to assess iron stores. - Order vitamin B12 and folate levels to check for deficiencies. - Order vitamin D level to assess for deficiency. - Order thyroid function tests to rule out hypothyroidism.  Follow-up Follow-up needed to assess response to treatment and review lab results. - Schedule follow-up appointment in August. - Advise to return for lab work Monday through Friday, 8:00 AM to 11:30 AM or 1:00 PM to 4:30 PM.      Return in about 2 months (around 10/11/2023) for acne f/u .        Mimi Alt, MD  [facility] [phone] (phone) [phone] (fax)  [facility]

## 2023-08-12 NOTE — Addendum Note (Signed)
 Addended by: SIMMONS-ROBINSON, Genean Adamski L on: 08/12/2023 11:28 AM   Modules accepted: Orders

## 2023-08-13 ENCOUNTER — Telehealth: Payer: Self-pay

## 2023-08-13 NOTE — Telephone Encounter (Signed)
 Attempted to contact the pt, the mailbox was full, wasn't able to leave a message. If the pt returns the call E2C2 of to inform her that yes she needs to be fasting for her labs

## 2023-08-21 LAB — CBC
Hematocrit: 39.4 % (ref 34.0–46.6)
Hemoglobin: 12.9 g/dL (ref 11.1–15.9)
MCH: 29.9 pg (ref 26.6–33.0)
MCHC: 32.7 g/dL (ref 31.5–35.7)
MCV: 91 fL (ref 79–97)
Platelets: 273 10*3/uL (ref 150–450)
RBC: 4.31 x10E6/uL (ref 3.77–5.28)
RDW: 12.7 % (ref 11.7–15.4)
WBC: 11.7 10*3/uL — ABNORMAL HIGH (ref 3.4–10.8)

## 2023-08-21 LAB — IRON,TIBC AND FERRITIN PANEL
Ferritin: 34 ng/mL (ref 15–150)
Iron Saturation: 26 % (ref 15–55)
Iron: 88 ug/dL (ref 27–159)
Total Iron Binding Capacity: 334 ug/dL (ref 250–450)
UIBC: 246 ug/dL (ref 131–425)

## 2023-08-21 LAB — VITAMIN B12: Vitamin B-12: 305 pg/mL (ref 232–1245)

## 2023-08-21 LAB — VITAMIN D 25 HYDROXY (VIT D DEFICIENCY, FRACTURES): Vit D, 25-Hydroxy: 18.3 ng/mL — ABNORMAL LOW (ref 30.0–100.0)

## 2023-08-21 LAB — TSH+T4F+T3FREE
Free T4: 1.21 ng/dL (ref 0.82–1.77)
T3, Free: 3.1 pg/mL (ref 2.0–4.4)
TSH: 1.73 u[IU]/mL (ref 0.450–4.500)

## 2023-08-21 LAB — FOLATE: Folate: 6.5 ng/mL (ref >3.0)

## 2023-08-23 ENCOUNTER — Ambulatory Visit: Payer: Self-pay | Admitting: Family Medicine

## 2023-08-23 DIAGNOSIS — E559 Vitamin D deficiency, unspecified: Secondary | ICD-10-CM

## 2023-08-23 MED ORDER — VITAMIN D (ERGOCALCIFEROL) 1.25 MG (50000 UNIT) PO CAPS: 50000.0000 [IU] | ORAL_CAPSULE | ORAL | 1 refills | Status: DC

## 2023-10-15 ENCOUNTER — Ambulatory Visit: Admitting: Family Medicine

## 2023-10-15 NOTE — Progress Notes (Deleted)
      Established patient visit   Patient: Kathleen Owen   DOB: [birth year]   33 y.o. Female  MRN: [id number] Visit Date: 10/15/2023  Today's healthcare provider: Rockie Agent, MD   No chief complaint on file.  Subjective       Discussed the use of AI scribe software for clinical note transcription with the patient, who gave verbal consent to proceed.  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 (Patient not taking: Reported on 08/11/2023)   ALPRAZolam  (XANAX ) 0.25 MG tablet Take 1 tablet (0.25 mg total) by mouth 2 (two) times daily as needed for anxiety. (Patient not taking: Reported on 08/11/2023)   azithromycin  (ZITHROMAX ) 500 MG tablet Take 2 tablets (1,000 mg total) by mouth daily. (Patient not taking: Reported on 08/11/2023)   benzoyl peroxide  5 % external liquid Apply topically 2 (two) times daily.   doxycycline  (VIBRA -TABS) 100 MG tablet Take 1 tablet (100 mg total) by mouth 2 (two) times daily.   fluconazole  (DIFLUCAN ) 150 MG tablet Take 1 tablet (150 mg total) by mouth every 3 (three) days. (Patient not taking: Reported on 08/11/2023)   ondansetron  (ZOFRAN -ODT) 4 MG disintegrating tablet Take 1 tablet (4 mg total) by mouth every 8 (eight) hours as needed for nausea or vomiting. (Patient not taking: Reported on 08/11/2023)   Vitamin D , Ergocalciferol , (DRISDOL ) 1.25 MG (50000 UNIT) CAPS capsule Take 1 capsule (50,000 Units total) by mouth every 7 (seven) days.   No facility-administered medications prior to visit.    Review of Systems  {Insert previous labs  (optional):23779} {See past labs  Heme  Chem  Endocrine  Serology  Results Review (optional):1}   Objective    There were no vitals taken for this visit. {Insert last BP/Wt (optional):23777}{See vitals history (optional):1}    Physical Exam  ***  No results found for any visits on 10/15/23.  Assessment & Plan     Problem List Items Addressed This Visit   None   Assessment and Plan Assessment & Plan      No follow-ups on file.         Rockie Agent, MD  [facility] [phone] (phone) [phone] (fax)  [facility]

## 2023-11-12 ENCOUNTER — Ambulatory Visit (INDEPENDENT_AMBULATORY_CARE_PROVIDER_SITE_OTHER): Admitting: Family Medicine

## 2023-11-12 ENCOUNTER — Other Ambulatory Visit (HOSPITAL_COMMUNITY)
Admission: RE | Admit: 2023-11-12 | Discharge: 2023-11-12 | Disposition: A | Discharge status: Home / Self Care | Source: Ambulatory Visit | Attending: Family Medicine | Admitting: Family Medicine

## 2023-11-12 ENCOUNTER — Encounter: Payer: Self-pay | Admitting: Family Medicine

## 2023-11-12 VITALS — BP 106/71 | HR 78 | Temp 98.6°F | Ht 63.0 in | Wt 137.1 lb

## 2023-11-12 DIAGNOSIS — N3000 Acute cystitis without hematuria: Secondary | ICD-10-CM | POA: Diagnosis not present

## 2023-11-12 DIAGNOSIS — B64 Unspecified protozoal disease: Secondary | ICD-10-CM

## 2023-11-12 DIAGNOSIS — R102 Pelvic and perineal pain: Secondary | ICD-10-CM | POA: Diagnosis present

## 2023-11-12 DIAGNOSIS — R350 Frequency of micturition: Secondary | ICD-10-CM | POA: Diagnosis not present

## 2023-11-12 DIAGNOSIS — B3731 Acute candidiasis of vulva and vagina: Secondary | ICD-10-CM | POA: Diagnosis not present

## 2023-11-12 DIAGNOSIS — K50911 Crohn's disease, unspecified, with rectal bleeding: Secondary | ICD-10-CM

## 2023-11-12 DIAGNOSIS — B839 Helminthiasis, unspecified: Secondary | ICD-10-CM

## 2023-11-12 LAB — POCT URINALYSIS DIPSTICK
Bilirubin, UA: NEGATIVE
Blood, UA: NEGATIVE
Glucose, UA: NEGATIVE
Ketones, UA: POSITIVE
Odor: ABNORMAL
Protein, UA: POSITIVE — AB
Spec Grav, UA: 1.01 (ref 1.010–1.025)
Urobilinogen, UA: 0.2 U/dL
pH, UA: 8 (ref 5.0–8.0)

## 2023-11-12 MED ORDER — SULFAMETHOXAZOLE-TRIMETHOPRIM 800-160 MG PO TABS: 1.0000 | ORAL_TABLET | Freq: Two times a day (BID) | ORAL | 0 refills | Status: AC

## 2023-11-12 MED ORDER — PRAZIQUANTEL 600 MG PO TABS: 600.0000 mg | ORAL_TABLET | ORAL | 0 refills | Status: DC

## 2023-11-12 MED ORDER — FLUCONAZOLE 150 MG PO TABS: 150.0000 mg | ORAL_TABLET | ORAL | 0 refills | Status: DC

## 2023-11-12 MED ORDER — TRIAMCINOLONE ACETONIDE 0.025 % EX OINT: 1.0000 | TOPICAL_OINTMENT | Freq: Two times a day (BID) | CUTANEOUS | 0 refills | Status: DC

## 2023-11-12 NOTE — Progress Notes (Signed)
 Established patient visit   Patient: Kathleen Owen   DOB: 04/26/1990   33 y.o. Female  MRN: [id number] Visit Date: 11/12/2023  Today's healthcare provider: Rockie Agent, MD   Chief Complaint  Patient presents with   Medical Management of Chronic Issues    Patient is present for follow up of chronic issues    Vaginal Itching    Patient c/o vaginal itching, pain and frequency of urination x days. Would like to check for uti and swab for all sti and yeast/ BV. Has been swabbed and done blood testing for HSV.    Subjective     HPI     Medical Management of Chronic Issues    Additional comments: Patient is present for follow up of chronic issues         Vaginal Itching    Additional comments: Patient c/o vaginal itching, pain and frequency of urination x days. Would like to check for uti and swab for all sti and yeast/ BV. Has been swabbed and done blood testing for HSV.       Last edited by Cherry Chiquita HERO, CMA on 11/12/2023  2:14 PM.       Discussed the use of AI scribe software for clinical note transcription with the patient, who gave verbal consent to proceed.  History of Present Illness Kathleen Owen is a 33 year old female with Crohn's disease who presents with concerns of vaginal itching, pain, and urinary frequency.  She has been experiencing vaginal itching, pain, and increased urinary frequency for the past two to three months. Previous evaluations suggested a yeast infection, and she was treated with Diflucan  (fluconazole ), but her symptoms have persisted. A point of care urinalysis today was positive for nitrites and ketones. No dietary changes such as a keto diet that might explain the presence of ketones. She has a history of recurrent UTIs and has experienced kidney infections in the past.  She  requests testing for chlamydia, gonorrhea, yeast, and bacterial vaginosis (BV). She has previously been tested for HSV with both blood and swab  tests.  She has a history of Crohn's disease and previous C. diff infections. She has been experiencing gastrointestinal symptoms and recently had a stool test conducted by a veterinarian, which was positive for protozoa and helminths. She mentions a history of travel to [place] and a possible connection to her current symptoms.   She has a history of being unable to take amoxicillin, aspirin, or ibuprofen .     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 (Patient not taking: Reported on 08/11/2023)   ALPRAZolam  (XANAX ) 0.25 MG tablet Take 1 tablet (0.25 mg total) by mouth 2 (two) times daily as needed for anxiety. (Patient not taking: Reported on 08/11/2023)   azithromycin  (ZITHROMAX ) 500 MG tablet Take 2 tablets (1,000 mg total) by mouth daily. (Patient not taking: Reported on 08/11/2023)   benzoyl peroxide  5 % external liquid Apply topically 2 (two) times daily.   ondansetron  (ZOFRAN -ODT) 4 MG disintegrating tablet Take 1 tablet (4 mg total) by mouth every 8 (eight) hours as  needed for nausea or vomiting. (Patient not taking: Reported on 08/11/2023)   Vitamin D , Ergocalciferol , (DRISDOL ) 1.25 MG (50000 UNIT) CAPS capsule Take 1 capsule (50,000 Units total) by mouth every 7 (seven) days.   [DISCONTINUED] doxycycline  (VIBRA -TABS) 100 MG tablet Take 1 tablet (100 mg total) by mouth 2 (two) times daily.   [DISCONTINUED] fluconazole  (DIFLUCAN ) 150 MG tablet Take 1 tablet (150 mg total) by mouth every 3 (three) days. (Patient not taking: Reported on 08/11/2023)   No facility-administered medications prior to visit.    Review of  Systems  Last CBC Lab Results  Component Value Date   WBC 11.3 (H) 11/12/2023   HGB 12.8 11/12/2023   HCT 39.6 11/12/2023   MCV 91 11/12/2023   MCH 29.5 11/12/2023   RDW 12.8 11/12/2023   PLT 276 11/12/2023   Last metabolic panel Lab Results  Component Value Date   GLUCOSE 95 11/12/2023   NA 138 11/12/2023   K 4.0 11/12/2023   CL 99 11/12/2023   CO2 24 11/12/2023   BUN 10 11/12/2023   CREATININE 0.63 11/12/2023   EGFR 120 11/12/2023   CALCIUM 9.7 11/12/2023   PHOS 3.5 07/26/2017   PROT 7.3 11/12/2023   ALBUMIN 4.5 11/12/2023   LABGLOB 2.8 11/12/2023   BILITOT 0.5 11/12/2023   ALKPHOS 49 11/12/2023   AST 10 11/12/2023   ALT 9 11/12/2023   ANIONGAP 6 01/11/2023   Last lipids No results found for: CHOL, HDL, LDLCALC, LDLDIRECT, TRIG, CHOLHDL Last hemoglobin A1c No results found for: HGBA1C Last thyroid functions Lab Results  Component Value Date   TSH 1.730 08/20/2023   Last vitamin D  Lab Results  Component Value Date   VD25OH 18.3 (L) 08/20/2023   Last vitamin B12 and Folate Lab Results  Component Value Date   VITAMINB12 305 08/20/2023   FOLATE 6.5 08/20/2023        Objective    BP 106/71 (BP Location: Right Arm, Patient Position: Sitting, Cuff Size: Normal)   Pulse 78   Temp 98.6 F (37 C) (Oral)   Ht 5' 3 (1.6 m)   Wt 137 lb 1.6 oz (62.2 kg)   LMP 11/06/2023 (Exact Date)   SpO2 100%   BMI 24.29 kg/m  BP Readings from Last 3 Encounters:  11/12/23 106/71  08/11/23 92/68  02/18/23 132/83   Wt Readings from Last 3 Encounters:  11/12/23 137 lb 1.6 oz (62.2 kg)  08/11/23 135 lb (61.2 kg)  02/18/23 136 lb (61.7 kg)        Physical Exam  Physical Exam ABDOMEN: Suprapubic tenderness present, bowel sounds normal. GENITOURINARY: External genitalia normal in appearance. No erythema, no lesions, slightly moist, no bleeding on external exam. MUSCULOSKELETAL: Back examined, no abnormalities noted.    Results for orders placed  or performed in visit on 11/12/23  POCT Urinalysis Dipstick  Result Value Ref Range   Color, UA Orange    Clarity, UA clear    Glucose, UA Negative Negative   Bilirubin, UA negative    Ketones, UA Positive- Trace    Spec Grav, UA 1.010 1.010 - 1.025   Blood, UA negative    pH, UA 8.0 5.0 - 8.0   Protein, UA Positive (A) Negative   Urobilinogen, UA 0.2 0.2 or 1.0 E.U./dL   Nitrite, UA postive    Leukocytes, UA Moderate (2+) (A) Negative   Appearance clear    Odor abnormal     Assessment & Plan     Problem List Items  Addressed This Visit     Acute cystitis without hematuria - Primary   Relevant Medications   sulfamethoxazole -trimethoprim  (BACTRIM  DS) 800-160 MG tablet   triamcinolone  (KENALOG ) 0.025 % ointment   CD (Crohn's disease) (HCC)   Other Visit Diagnoses       Urinary frequency       Relevant Orders   Urine Culture   POCT Urinalysis Dipstick (Completed)     Vaginal pain       Relevant Orders   Cervicovaginal ancillary only     Vulvovaginal candidiasis       Relevant Medications   fluconazole  (DIFLUCAN ) 150 MG tablet   sulfamethoxazole -trimethoprim  (BACTRIM  DS) 800-160 MG tablet   triamcinolone  (KENALOG ) 0.025 % ointment     Helminth infection       Relevant Medications   fluconazole  (DIFLUCAN ) 150 MG tablet   praziquantel  (BILTRICIDE ) 600 MG tablet   sulfamethoxazole -trimethoprim  (BACTRIM  DS) 800-160 MG tablet   Other Relevant Orders   CMP14+EGFR   CBC     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 Symptoms include vaginal itching, pain, and frequency of urination. The condition has been  ongoing for about two to three months. No hematuria noted. - Prescribe Bactrim  800-160 mg twice a day for one week - Order urine culture - Advise to start Diflucan  after completing antibiotics  Vulvar irritation Vulvar irritation with symptoms of swelling and pain upon contact. The external genitalia appear normal with no erythema or lesions, but slightly moist. The irritation is likely secondary to the UTI. - Prescribe triamcinolone  cream 0.025% to be applied twice a day for up to one week - Advise not to use the cream until starting antibiotics  Intestinal parasitic infection (helminths and protozoa) Stool study conducted by a veterinarian showed positive results for protozoa and helminths, with eggs and larvae embedded in the GI mucosa. She has a history of travel to [place] and exposure to dogs with fleas, which may be the source of the infection. - Prescribe Praziquantel  600 mg as a single dose today and another 600 mg dose in one week - Consider albendazole if necessary, ensuring no interaction with UTI treatment  Diarrhea Ongoing diarrhea, possibly related to intestinal parasitic infection or Crohn's disease. She reports tenderness in the suprapubic area. - Monitor symptoms and adjust treatment based on response to antiparasitic therapy  Crohn's disease Crohn's disease with previous complications including C. diff infection. She is trying to manage symptoms and has been seeking GI specialist care.  Chronic back pain with skin irritation Chronic back pain with skin irritation, possibly related to massage therapy. She reports bumps around the bra strap area, which spread when the bra was removed. - Provide a note to discontinue massage therapy membership if not beneficial     Return in about 6 weeks (around 12/24/2023) for UTI,vaginal pain f/u .         Rockie Agent, MD  [facility] [phone] (phone) [phone] (fax)  [facility]

## 2023-11-12 NOTE — Patient Instructions (Signed)
 To keep you healthy, please keep in mind the following health maintenance items that you are due for:   Health Maintenance Due  Topic Date Due   Pneumococcal Vaccine (1 of 2 - PCV) Never done   DTaP/Tdap/Td (7 - Td or Tdap) 04/25/2018     Best Wishes,   Dr. Lang

## 2023-11-13 LAB — CMP14+EGFR
ALT: 9 IU/L (ref 0–32)
AST: 10 IU/L (ref 0–40)
Albumin: 4.5 g/dL (ref 3.9–4.9)
Alkaline Phosphatase: 49 IU/L (ref 41–116)
BUN/Creatinine Ratio: 16 (ref 9–23)
BUN: 10 mg/dL (ref 6–20)
Bilirubin Total: 0.5 mg/dL (ref 0.0–1.2)
CO2: 24 mmol/L (ref 20–29)
Calcium: 9.7 mg/dL (ref 8.7–10.2)
Chloride: 99 mmol/L (ref 96–106)
Creatinine, Ser: 0.63 mg/dL (ref 0.57–1.00)
Globulin, Total: 2.8 g/dL (ref 1.5–4.5)
Glucose: 95 mg/dL (ref 70–99)
Potassium: 4 mmol/L (ref 3.5–5.2)
Sodium: 138 mmol/L (ref 134–144)
Total Protein: 7.3 g/dL (ref 6.0–8.5)
eGFR: 120 mL/min/1.73 (ref >59)

## 2023-11-13 LAB — CBC
Hematocrit: 39.6 % (ref 34.0–46.6)
Hemoglobin: 12.8 g/dL (ref 11.1–15.9)
MCH: 29.5 pg (ref 26.6–33.0)
MCHC: 32.3 g/dL (ref 31.5–35.7)
MCV: 91 fL (ref 79–97)
Platelets: 276 x10E3/uL (ref 150–450)
RBC: 4.34 x10E6/uL (ref 3.77–5.28)
RDW: 12.8 % (ref 11.7–15.4)
WBC: 11.3 x10E3/uL — ABNORMAL HIGH (ref 3.4–10.8)

## 2023-11-14 LAB — URINE CULTURE

## 2023-11-15 LAB — CERVICOVAGINAL ANCILLARY ONLY
Bacterial Vaginitis (gardnerella): NEGATIVE
Candida Glabrata: NEGATIVE
Candida Vaginitis: POSITIVE — AB
Chlamydia: NEGATIVE
Comment: NEGATIVE
Comment: NEGATIVE
Comment: NEGATIVE
Comment: NEGATIVE
Comment: NEGATIVE
Comment: NORMAL
Neisseria Gonorrhea: NEGATIVE
Trichomonas: NEGATIVE

## 2023-11-16 ENCOUNTER — Other Ambulatory Visit: Payer: Self-pay | Admitting: Family Medicine

## 2023-11-16 ENCOUNTER — Telehealth: Payer: Self-pay

## 2023-11-16 DIAGNOSIS — B839 Helminthiasis, unspecified: Secondary | ICD-10-CM

## 2023-11-16 DIAGNOSIS — B64 Unspecified protozoal disease: Secondary | ICD-10-CM

## 2023-11-16 MED ORDER — PRAZIQUANTEL 600 MG PO TABS: 600.0000 mg | ORAL_TABLET | ORAL | 0 refills | Status: DC

## 2023-11-16 NOTE — Telephone Encounter (Unsigned)
 Copied from CRM #[id number]. Topic: Clinical - Lab/Test Results >> Nov 16, 2023  1:10 PM Yolanda T wrote: Reason for CRM: patient called to get the results from her labs on 9/19. Please f/u with patient

## 2023-11-16 NOTE — Telephone Encounter (Signed)
 Copied from CRM [id number]. Topic: Clinical - Medication Question >> Nov 16, 2023 10:27 AM Montie POUR wrote: Reason for CRM:  [facility] pharmacy needs a new order for praziquantel  (BILTRICIDE ) 600 MG tablet Need to correct: Dispense Quantity: 0.107 tablet Sig: Take 1 tablet (600 mg total) by mouth as directed. Please call [facility] with any questions at [phone]. Thanks

## 2023-11-16 NOTE — Telephone Encounter (Signed)
 Rx updated.

## 2023-11-17 ENCOUNTER — Ambulatory Visit: Payer: Self-pay | Admitting: Family Medicine

## 2023-11-17 NOTE — Telephone Encounter (Signed)
 Contacted patient with results. No questions at this time and patient understood

## 2023-11-17 NOTE — Telephone Encounter (Signed)
Please see results encounter

## 2023-11-24 ENCOUNTER — Telehealth: Payer: Self-pay

## 2023-11-24 NOTE — Telephone Encounter (Unsigned)
 Copied from CRM [id number]. Topic: Clinical - Lab/Test Results >> Nov 24, 2023  8:56 AM Willma SAUNDERS wrote: Reason for CRM: Alan from [employer] stating additional diagnosis code for labs done on 08/20/2023.  Alan can be reached at [phone]

## 2023-12-07 ENCOUNTER — Ambulatory Visit
Admission: EM | Admit: 2023-12-07 | Discharge: 2023-12-07 | Disposition: A | Discharge status: Home / Self Care | Attending: Emergency Medicine | Admitting: Emergency Medicine

## 2023-12-07 DIAGNOSIS — U071 COVID-19: Secondary | ICD-10-CM | POA: Diagnosis not present

## 2023-12-07 MED ORDER — PAXLOVID (300/100) 20 X 150 MG & 10 X 100MG PO TBPK: 3.0000 | ORAL_TABLET | Freq: Two times a day (BID) | ORAL | 0 refills | Status: AC

## 2023-12-07 NOTE — Discharge Instructions (Addendum)
 Take the Paxlovid as directed.  Take Tylenol  as needed for fever or discomfort.  Rest and keep yourself hydrated.    Follow up with your primary care provider.  Go to the emergency department if you have worsening symptoms.

## 2023-12-07 NOTE — ED Provider Notes (Signed)
 Kathleen Owen    CSN: [id number] Arrival date & time: 12/07/23  1043      History   Chief Complaint Chief Complaint  Patient presents with   Covid Positive    HPI Kathleen Owen is a 33 y.o. female.  Patient presents with 2-day history of fever, body aches, chills, congestion, sore throat, mild cough.  Tmax 100.  She has been treating her symptoms with Tylenol .  No shortness of breath, vomiting, diarrhea.  She reports positive COVID test at home today.  Her medical history includes Crohn's disease, airway hyperreactivity, depression, anxiety, PTSD.  Patient is not currently on any prescription medications.   The history is provided by the patient and medical records.    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   Bacterial vaginosis August 2016   recurrent BV   CD (Crohn's disease) (HCC) 07/30/2014   Crohn's disease (HCC) Jan 2016   Depression    Miscarriage 07/18/2014    Patient Active Problem List   Diagnosis Date Noted   Acute cystitis without hematuria 11/12/2023   C. 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/2016   GAD (generalized anxiety disorder) 09/17/2015   Major depressive disorder, single episode, moderate (HCC) 09/17/2015   Smoker 11/22/2014   Recurrent vaginitis 11/22/2014   Airway hyperreactivity 07/30/2014   CD (Crohn's disease) (HCC) 07/30/2014   Body mass index (BMI) of 27.0-27.9 in adult 10/03/2013    Past Surgical History:  Procedure Laterality Date   DILATION AND CURETTAGE OF UTERUS     DILATION AND EVACUATION N/A 12/02/2016    Procedure: DILATATION AND EVACUATION;  Surgeon: Janit Alm Agent, MD;  Location: [facility];  Service: Gynecology;  Laterality: N/A;   DILATION AND EVACUATION N/A 06/30/2017   Procedure: DILATATION AND EVACUATION;  Surgeon: Janit Alm Agent, MD;  Location: [facility];  Service: Gynecology;  Laterality: N/A;   DILATION AND EVACUATION N/A 12/06/2018   Procedure: SUCTION D&C;  Surgeon: Lake Read, MD;  Location: [facility];  Service: Gynecology;  Laterality: N/A;   MYOMECTOMY N/A 04/06/2019   Procedure: MYOMECTOMY VIA LAPAROTOMY;  Surgeon: Lake Read, MD;  Location: [facility];  Service: Gynecology;  Laterality: N/A;   OVARIAN CYST SURGERY  07/2022   TONSILLECTOMY      OB History     Gravida  19   Para  0   Term      Preterm      AB  16   Living  0      SAB  14   IAB  1   Ectopic      Multiple      Live Births  0            Home Medications    Prior to Admission medications   Medication Sig Start Date End Date Taking? Authorizing Provider  nirmatrelvir/ritonavir (PAXLOVID, 300/100,) 20 x 150 MG & 10 x 100MG  TBPK Take 3 tablets by mouth 2 (two) times daily for 5 days. 12/07/23 12/12/23 Yes Corlis Burnard DEL, NP    Family History Family History  Problem Relation Age of Onset  Healthy Mother    Healthy Father    Hypertension Father    Kidney disease Maternal Uncle    Heart failure Maternal Uncle    Thyroid disease Paternal Aunt    Heart failure Maternal Grandmother    Hypertension Paternal Grandmother    Cancer Paternal Grandmother    Cancer Paternal Grandfather    Mental illness Neg Hx     Social History Social History   Tobacco Use   Smoking status: Former    Types: Cigars   Smokeless tobacco: Never   Tobacco comments:    black and mild 2-3 cigars a day  Vaping Use   Vaping status: Every Day  Substance Use Topics   Alcohol use: Yes    Alcohol/week: 2.0 standard drinks of alcohol    Types: 2 Cans of beer per week    Comment: 1 x month    Drug use: Not Currently    Types: Marijuana    Comment: last use age 66     Allergies   Aspirin, Ibuprofen , and Amoxicillin   Review of Systems Review of Systems  Constitutional:  Positive for chills, fatigue and fever.  HENT:  Positive for congestion and sore throat. Negative for ear pain.   Respiratory:  Positive for cough. Negative for shortness of breath.   Gastrointestinal:  Negative for abdominal pain, diarrhea and vomiting.     Physical Exam Triage Vital Signs ED Triage Vitals  Encounter Vitals Group     BP 12/07/23 1143 107/74     Girls Systolic BP Percentile --      Girls Diastolic BP Percentile --      Boys Systolic BP Percentile --      Boys Diastolic BP Percentile --      Pulse Rate 12/07/23 1143 64     Resp 12/07/23 1143 17     Temp 12/07/23 1143 98 F (36.7 C)     Temp src --      SpO2 12/07/23 1143 98 %     Weight --      Height --      Head Circumference --      Peak Flow --      Pain Score 12/07/23 1133 6     Pain Loc --      Pain Education --      Exclude from Growth Chart --    No data found.  Updated Vital Signs BP 107/74   Pulse 64   Temp 98 F (36.7 C)   Resp 17   LMP 12/03/2023 (Exact Date)   SpO2 98%   Visual Acuity Right Eye Distance:   Left Eye Distance:   Bilateral Distance:    Right Eye Near:   Left Eye Near:    Bilateral Near:     Physical Exam Constitutional:      General: She is not in acute distress.    Appearance: She is ill-appearing.  HENT:     Right Ear: Tympanic membrane normal.     Left Ear: Tympanic membrane normal.     Nose: Nose normal.     Mouth/Throat:     Mouth: Mucous membranes are moist.     Pharynx: Oropharynx is clear.  Cardiovascular:     Rate and Rhythm: Normal rate and regular rhythm.     Heart sounds: Normal heart sounds.  Pulmonary:     Effort: Pulmonary effort is normal. No respiratory distress.     Breath sounds: Normal breath sounds.  Abdominal:  General: Bowel sounds are  normal.     Palpations: Abdomen is soft.     Tenderness: There is no abdominal tenderness. There is no guarding or rebound.  Neurological:     Mental Status: She is alert.      UC Treatments / Results  Labs (all labs ordered are listed, but only abnormal results are displayed) Labs Reviewed - No data to display  EKG   Radiology No results found.  Procedures Procedures (including critical care time)  Medications Ordered in UC Medications - No data to display  Initial Impression / Assessment and Plan / UC Course  I have reviewed the triage vital signs and the nursing notes.  Pertinent labs & imaging results that were available during my care of the patient were reviewed by me and considered in my medical decision making (see chart for details).    Positive self-administered COVID test.  Afebrile and vital signs are stable.  Lungs are clear and O2 sat is 98% on room air.  Treating with Paxlovid.  GFR 120 on 11/12/2023.  Discussed that this medication is emergency authorized for treatment of COVID.  Discussed the side effects of Paxlovid, including dysgeusia, diarrhea, myalgias, hypertension.  Also discussed the possibility of rebound COVID.  Instructed patient to follow up with her PCP.  ED precautions given.  Patient agrees to plan of care.    Final Clinical Impressions(s) / UC Diagnoses   Final diagnoses:  Positive self-administered antigen test for COVID-19     Discharge Instructions      Take the Paxlovid as directed.  Take Tylenol  as needed for fever or discomfort.  Rest and keep yourself hydrated.    Follow up with your primary care provider.  Go to the emergency department if you have worsening symptoms.         ED Prescriptions     Medication Sig Dispense Auth. Provider   nirmatrelvir/ritonavir (PAXLOVID, 300/100,) 20 x 150 MG & 10 x 100MG  TBPK Take 3 tablets by mouth 2 (two) times daily for 5 days. 30 tablet Corlis Burnard DEL, NP      PDMP not reviewed  this encounter.   Corlis Burnard DEL, NP 12/07/23 1218

## 2023-12-07 NOTE — ED Triage Notes (Signed)
 Patient to Urgent Care with complaints of sore throat/ nasal congestion/ chills/ body aches/ fevers (100).   Symptoms started Sunday. Positive Covid test today.  Meds: tylenol 

## 2023-12-27 ENCOUNTER — Telehealth: Payer: Self-pay

## 2023-12-27 NOTE — Telephone Encounter (Signed)
 Received call from patient who is requesting serial beta hcg testing be done.  LMP 12/01/23.  Patient had positive home test last night.  She is a G19P0.  She reports having numerous fertility work ups done by Ob/Gyn over the years and everything has been normal.  She was seen briefly by a fertility specialist but could not afford the appointments.  Last miscarriage was in 2023 requiring a D&C.  Advised I will need to check with the provider on call to determine what might be the best first step for this pregnancy.  Message forwarded to Harland Birkenhead MD for review and plan of care.

## 2023-12-28 NOTE — Telephone Encounter (Signed)
 Spoke to Dr. Starla in person.  Pt is scheduled to see her next Monday to discuss current pregnancy and hx of miscarriages.

## 2024-01-03 ENCOUNTER — Ambulatory Visit: Admitting: Obstetrics & Gynecology

## 2024-01-03 VITALS — BP 103/71 | HR 90 | Ht 63.0 in | Wt 138.8 lb

## 2024-01-03 DIAGNOSIS — Z3A00 Weeks of gestation of pregnancy not specified: Secondary | ICD-10-CM

## 2024-01-03 DIAGNOSIS — Z3400 Encounter for supervision of normal first pregnancy, unspecified trimester: Secondary | ICD-10-CM

## 2024-01-03 DIAGNOSIS — O2620 Pregnancy care for patient with recurrent pregnancy loss, unspecified trimester: Secondary | ICD-10-CM

## 2024-01-03 MED ORDER — PROGESTERONE 200 MG PO CAPS
ORAL_CAPSULE | ORAL | 1 refills | Status: AC
Start: 2024-01-03 — End: ?

## 2024-01-03 NOTE — Progress Notes (Signed)
 Subjective:    Kathleen Owen is a married [id number] at unknown gest age being seen today for her first obstetrical visit.  Her last normal period was in August but she has bleeding episodes in Sept and October. Her first + UPT was about 2 weeks ago. Her obstetrical history is significant for Chron's disease. It is diet controlled at present, doing well. She has been seeing the GI at [facility] in [place] but plans to change to a GI at [facility]. Patient does intend to breast feed. Pregnancy history fully reviewed. She has seen [facility] in [place] last week with a phone visit. This pregnancy was spontaneous. She has never met those providers in person.  Patient reports fatigue. She has had recent screening labs and they are normal.  Vitals:   01/03/24 1443  BP: 103/71  Pulse: 90  Weight: 138 lb 12.8 oz (63 kg)  Height: 5' 3 (1.6 m)    HISTORY: OB History  Gravida Para Term Preterm AB Living  19 0   19   SAB IAB Ectopic Multiple Live Births  17 1       # Outcome Date GA Lbr Len/2nd Weight Sex Type Anes PTL Lv  19 SAB 12/06/18          18 SAB 12/02/16          17 SAB 2018          16 SAB 2017          15 SAB 07/22/14          14 SAB 04/23/14          13 SAB 03/23/14          12 SAB 03/23/13 [redacted]w[redacted]d         11 IAB 07/21/08          10 AB 2007     TAB     9 SAB           8 SAB           7 SAB           6 SAB           5 SAB           4 SAB           3 SAB           2 SAB         ND  1 SAB         ND   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   Bacterial vaginosis August 2016   recurrent BV   CD (Crohn's disease) (HCC) 07/30/2014   Crohn's disease (HCC) Jan 2016   Depression    Miscarriage 07/18/2014   Past Surgical History:  Procedure Laterality Date   DILATION AND CURETTAGE OF UTERUS     DILATION AND EVACUATION N/A 12/02/2016   Procedure: DILATATION AND EVACUATION;  Surgeon: Janit Alm Agent, MD;  Location: [facility];   Service: Gynecology;  Laterality: N/A;   DILATION AND EVACUATION N/A 06/30/2017   Procedure: DILATATION AND EVACUATION;  Surgeon: Janit Alm Agent, MD;  Location: [facility];  Service: Gynecology;  Laterality: N/A;   DILATION AND EVACUATION N/A 12/06/2018   Procedure: SUCTION D&C;  Surgeon: Lake Read, MD;  Location: [facility];  Service: Gynecology;  Laterality: N/A;   MYOMECTOMY N/A 04/06/2019   Procedure: MYOMECTOMY VIA LAPAROTOMY;  Surgeon: Lake,  Glory, MD;  Location: [facility];  Service: Gynecology;  Laterality: N/A;   OVARIAN CYST SURGERY  07/2022   TONSILLECTOMY     Family History  Problem Relation Age of Onset   Healthy Mother    Healthy Father    Hypertension Father    Kidney disease Maternal Uncle    Heart failure Maternal Uncle    Thyroid disease Paternal Aunt    Heart failure Maternal Grandmother    Hypertension Paternal Grandmother    Cancer Paternal Grandmother    Cancer Paternal Grandfather    Mental illness Neg Hx    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 She reports a normal pap smear at [facility] (family [facility]) that was normal this year. She denies a h/o abnormal paps.   Exam  Well nourished, well hydrated Black female, no apparent distress She is ambulating and conversing normally. General:  alert   Breasts:  inspection negative, no nipple discharge or bleeding, no masses or nodularity palpable  Lungs: clear to auscultation bilaterally  Heart:  regular rate and rhythm, S1, S2 normal, no murmur, click, rub or gallop  Abdomen: soft, non-tender; bowel sounds normal; no masses,  no organomegaly I was unable to hear FHTs with a Doppler   Assessment:    Pregnancy: [id number] Patient Active Problem List   Diagnosis Date Noted   Acute cystitis without hematuria 11/12/2023   C. 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/2016   GAD (generalized anxiety disorder) 09/17/2015   Major depressive disorder, single episode, moderate (HCC) 09/17/2015   Smoker 11/22/2014   Recurrent vaginitis 11/22/2014   Airway hyperreactivity 07/30/2014   CD (Crohn's disease) (HCC) 07/30/2014   Body mass index (BMI) of 27.0-27.9 in adult 10/03/2013        Plan:     Initial labs drawn. Prenatal vitamins. Problem list reviewed and updated. Start progesterone  now Baby asa around 10 weeks Will need scan to determine gestational age. She was offered an appt tomorrow at 10:15. Genetics prn when gest age is determined. Check progesterone  level and QBHCG.  Harland JAYSON Birkenhead 01/03/2024

## 2024-01-03 NOTE — Addendum Note (Signed)
 Addended by: MILLICENT MATHIS CROME on: 01/03/2024 03:09 PM   Modules accepted: Orders

## 2024-01-04 ENCOUNTER — Ambulatory Visit (INDEPENDENT_AMBULATORY_CARE_PROVIDER_SITE_OTHER)

## 2024-01-04 DIAGNOSIS — O2620 Pregnancy care for patient with recurrent pregnancy loss, unspecified trimester: Secondary | ICD-10-CM | POA: Diagnosis not present

## 2024-01-04 DIAGNOSIS — Z3A01 Less than 8 weeks gestation of pregnancy: Secondary | ICD-10-CM | POA: Diagnosis not present

## 2024-01-04 DIAGNOSIS — Z3400 Encounter for supervision of normal first pregnancy, unspecified trimester: Secondary | ICD-10-CM

## 2024-01-04 LAB — PROGESTERONE: Progesterone: 13.5 ng/mL

## 2024-01-04 NOTE — Addendum Note (Signed)
 Addended by: MILLICENT MATHIS CROME on: 01/04/2024 09:41 AM   Modules accepted: Orders

## 2024-01-17 ENCOUNTER — Other Ambulatory Visit: Payer: Self-pay

## 2024-01-17 DIAGNOSIS — O2620 Pregnancy care for patient with recurrent pregnancy loss, unspecified trimester: Secondary | ICD-10-CM

## 2024-01-17 NOTE — Progress Notes (Signed)
 Dating scan order placed per 01/04/24 U/S Impression: 1. [redacted]w[redacted]d Gestational sac seen consistent with a very early IUP. 2. Recommend EDD of 09/05/24 based on U/S.   Recommendations: 1.Clinical correlation with the patient's History and Physical Exam. 2. Pt. To return in 2 weeks to confirm progression of pregnancy.   Berwyn DELENA Hummer, RDMS (AB,OB,BR),RVT

## 2024-01-19 ENCOUNTER — Other Ambulatory Visit

## 2024-01-19 DIAGNOSIS — Z3A01 Less than 8 weeks gestation of pregnancy: Secondary | ICD-10-CM | POA: Diagnosis not present

## 2024-01-19 DIAGNOSIS — O2620 Pregnancy care for patient with recurrent pregnancy loss, unspecified trimester: Secondary | ICD-10-CM

## 2024-01-24 ENCOUNTER — Telehealth

## 2024-01-24 ENCOUNTER — Ambulatory Visit

## 2024-01-24 VITALS — BP 119/66 | Ht 63.0 in | Wt 141.0 lb

## 2024-01-24 DIAGNOSIS — Z3201 Encounter for pregnancy test, result positive: Secondary | ICD-10-CM

## 2024-01-24 LAB — PREGNANCY, URINE: Preg Test, Ur: POSITIVE — AB

## 2024-01-24 MED ORDER — PRENATAL 27-0.8 MG PO TABS: 1.0000 | ORAL_TABLET | Freq: Every day | ORAL | Status: AC

## 2024-01-24 NOTE — Progress Notes (Signed)
 UPT positive. Unsure where she plans prenatal care. Advised to establish care ASAP with high risk provider, such as [facility] or [facility]. Hx SAB x 19, no live births. Hx Crohn's disease which she explains is managed by diet.  Denies recent depression symptoms and acknowledges strong support system.   Consult Dr JAYSON Helling and informed of patient status and hx. Provider recommends patient to establish prenatal care with high risk provider, such as [facility] or [facility]. Also recommends to add Vitamin D  and omega 3 supplement to current prenatal vitamin as many women are deficient early in pregnancy.   RN Discussed provider recommendations with patient and she is in agreement.   Advised to seek immediate medical attention/go to ER with cramping and/or bleeding.    Positive preg packet given and reviewed.   The patient was dispensed prenatal vitamins #100 today per [facility] Dr JAYSON Helling. I provided counseling today regarding the medication. We discussed the medication, the side effects and when to call clinic. Patient given the opportunity to ask questions. Questions answered.    Sent to clerk for presumptive eligibility/ medicaid/preg women. Patty Lopezgarcia, RN

## 2024-01-26 ENCOUNTER — Encounter

## 2024-02-04 ENCOUNTER — Ambulatory Visit: Admitting: Family Medicine

## 2024-02-04 ENCOUNTER — Encounter: Payer: Self-pay | Admitting: Family Medicine

## 2024-02-04 ENCOUNTER — Other Ambulatory Visit (HOSPITAL_COMMUNITY)
Admission: RE | Admit: 2024-02-04 | Discharge: 2024-02-04 | Disposition: A | Source: Ambulatory Visit | Attending: Family Medicine | Admitting: Family Medicine

## 2024-02-04 VITALS — BP 96/60 | HR 82 | Ht 63.0 in | Wt 144.0 lb

## 2024-02-04 DIAGNOSIS — R300 Dysuria: Secondary | ICD-10-CM | POA: Diagnosis not present

## 2024-02-04 DIAGNOSIS — O2341 Unspecified infection of urinary tract in pregnancy, first trimester: Secondary | ICD-10-CM | POA: Diagnosis not present

## 2024-02-04 DIAGNOSIS — O209 Hemorrhage in early pregnancy, unspecified: Secondary | ICD-10-CM

## 2024-02-04 DIAGNOSIS — Z3A09 9 weeks gestation of pregnancy: Secondary | ICD-10-CM

## 2024-02-04 MED ORDER — SULFAMETHOXAZOLE-TRIMETHOPRIM 800-160 MG PO TABS: 1.0000 | ORAL_TABLET | Freq: Two times a day (BID) | ORAL | 0 refills | Status: AC

## 2024-02-04 NOTE — Progress Notes (Signed)
 Established patient visit   Patient: Kathleen Owen   DOB: 11-12-1990   33 y.o. Female  MRN: [id number] Visit Date: 02/04/2024  Today's healthcare provider: Rockie Agent, MD   Chief Complaint  Patient presents with   Follow-up    12 week f/u for UTI,vaginal pain. She was prescribe Bactrim  800-160 mg BID X 1 week and advised to start Diflucan  after completing abx and prescribed triamcinolone  cream 0.025% BID X 1 week. Patient reports she was not able to finish the medication due to it making her sick and only completed 2 days worth of medications. Symptoms are still present and the same. She reports she is pregnant.    Subjective     HPI     Follow-up    Additional comments: 12 week f/u for UTI,vaginal pain. She was prescribe Bactrim  800-160 mg BID X 1 week and advised to start Diflucan  after completing abx and prescribed triamcinolone  cream 0.025% BID X 1 week. Patient reports she was not able to finish the medication due to it making her sick and only completed 2 days worth of medications. Symptoms are still present and the same. She reports she is pregnant.       Last edited by Lilian Fitzpatrick, CMA on 02/04/2024  3:06 PM.       Discussed the use of AI scribe software for clinical note transcription with the patient, who gave verbal consent to proceed.  History of Present Illness Kathleen Owen is a 33 year old female who presents with urinary tract infection and vaginal pain.  She is experiencing symptoms of a urinary tract infection, including urinary frequency and dysuria. She was previously prescribed Bactrim  800-160 mg twice a day for a week but was unable to complete the course due to feeling sick, only managing two days of medication. Her urine analysis showed blood and white blood cells but was negative for nitrites.  She is currently nine weeks pregnant and was informed of her pregnancy during a visit to the emergency department. An ultrasound with  her previous OB-GYN indicated a nonviable pregnancy with a large sac, and the dates were inconsistent with her expectations. She has since changed providers and has an upcoming appointment. She started spotting today with vaginal bleeding.  She is experiencing vaginal pain and was previously prescribed triamcinolone  cream. Current symptoms persist.  She is allergic to aspirin, ibuprofen , and amoxicillin.     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   Bacterial vaginosis August 2016   recurrent BV   CD (Crohn's disease) (HCC) 07/30/2014   Crohn's disease (HCC) Jan 2016   Depression    Miscarriage 07/18/2014    Medications: Show/hide medication list[1]  Review of Systems      Objective    BP 96/60 (BP Location: Left Arm, Patient Position: Sitting, Cuff Size: Normal)   Pulse 82   Ht 5' 3 (1.6 m)   Wt 144 lb (65.3 kg)   LMP 12/03/2023 (Exact Date)   SpO2 100%   BMI 25.51 kg/m      Physical Exam  Physical Exam GENERAL: Well developed, no acute distress ABDOMEN: Soft, no suprapubic tenderness, no costovertebral angle tenderness    No results found for any visits on 02/04/24.  Assessment & Plan     Problem List Items Addressed This Visit   None Visit Diagnoses       Dysuria    -  Primary     [redacted]  weeks gestation of pregnancy         Urinary tract infection in mother during first trimester of pregnancy         Vaginal bleeding in pregnancy, first trimester           Assessment and Plan Assessment & Plan Urinary tract infection in pregnancy, first trimester subacute Persistent urinary tract infection symptoms despite incomplete Bactrim  course. Urinalysis shows blood and white blood cells, negative for nitrites. Concerns about antibiotic efficacy due to incomplete course and pregnancy status. - Ordered urine culture and microscopy - Prescribed Bactrim  800/160 mg twice daily for 10 days  Vaginal bleeding in pregnancy, first  trimester acute Started spotting with bacterial bleeding. Previous ultrasound indicated nonviable pregnancy with a large sac. Concerns about pregnancy loss due to spotting and bleeding. - Ordered gonorrhea, chlamydia, trichomonas, bacterial vaginosis, and yeast vaginal swab - Advised to go to maternal assessment unit for ultrasound to determine pregnancy viability, pt states that she will go to [facility] today  Pregnancy, [redacted] weeks gestation Currently 9 weeks weeks gestation. Previous ultrasound suggested nonviable pregnancy with a large sac. Dates were off from expected, leading to change in OB-GYN provider. Concerns about pregnancy viability due to spotting and bleeding. - Advised to go to maternal assessment unit for ultrasound to determine pregnancy viability     Return if symptoms worsen or fail to improve.         Rockie Agent, MD  [facility] [phone] (phone) [phone] (fax)  [facility]     [1]  Outpatient Medications Prior to Visit  Medication Sig   Prenatal Vit-Fe Fumarate-FA (MULTIVITAMIN-PRENATAL) 27-0.8 MG TABS tablet Take 1 tablet by mouth daily at 12 noon.   Prenatal Vit-Fe Fumarate-FA (PRENATAL VITAMINS) 27-0.8 MG TABS Take by mouth.   progesterone  (PROMETRIUM ) 200 MG capsule 1 tablet orally daily   No facility-administered medications prior to visit.

## 2024-02-04 NOTE — Patient Instructions (Addendum)
 [facility] [address] [place],  [place]  [address]  Main: [phone]

## 2024-02-05 ENCOUNTER — Other Ambulatory Visit: Payer: Self-pay

## 2024-02-05 DIAGNOSIS — J45909 Unspecified asthma, uncomplicated: Secondary | ICD-10-CM | POA: Insufficient documentation

## 2024-02-05 DIAGNOSIS — Z560 Unemployment, unspecified: Secondary | ICD-10-CM | POA: Insufficient documentation

## 2024-02-05 DIAGNOSIS — O039 Complete or unspecified spontaneous abortion without complication: Secondary | ICD-10-CM | POA: Diagnosis present

## 2024-02-05 DIAGNOSIS — O4690 Antepartum hemorrhage, unspecified, unspecified trimester: Secondary | ICD-10-CM | POA: Diagnosis present

## 2024-02-05 DIAGNOSIS — D649 Anemia, unspecified: Secondary | ICD-10-CM | POA: Diagnosis not present

## 2024-02-05 DIAGNOSIS — K5090 Crohn's disease, unspecified, without complications: Secondary | ICD-10-CM | POA: Insufficient documentation

## 2024-02-05 DIAGNOSIS — Z87891 Personal history of nicotine dependence: Secondary | ICD-10-CM | POA: Insufficient documentation

## 2024-02-05 DIAGNOSIS — N96 Recurrent pregnancy loss: Secondary | ICD-10-CM | POA: Diagnosis not present

## 2024-02-05 DIAGNOSIS — O021 Missed abortion: Secondary | ICD-10-CM | POA: Diagnosis not present

## 2024-02-05 LAB — URINALYSIS, MICROSCOPIC ONLY
Bacteria, UA: NONE SEEN
Casts: NONE SEEN /LPF

## 2024-02-05 LAB — URINALYSIS, ROUTINE W REFLEX MICROSCOPIC
Bacteria, UA: NONE SEEN
Bilirubin Urine: NEGATIVE
Glucose, UA: NEGATIVE mg/dL
Ketones, ur: NEGATIVE mg/dL
Leukocytes,Ua: NEGATIVE
Nitrite: NEGATIVE
Protein, ur: 100 mg/dL — AB
RBC / HPF: >50 RBC/hpf (ref 0–5)
Specific Gravity, Urine: 1.026 (ref 1.005–1.030)
Squamous Epithelial / HPF: 0 /HPF (ref 0–5)
pH: 5 (ref 5.0–8.0)

## 2024-02-05 LAB — POC URINE PREG, ED: Preg Test, Ur: POSITIVE — AB

## 2024-02-05 NOTE — ED Triage Notes (Signed)
 Went to [facility] ED  earlier today because she was having abdominal pain, vaginal bleeding told she was having a miscarriage. Treated for yeast infection and UTI. Reports that she is scheduled for D&C next week.  Increased bleeding about 1 hour ago. Changed pad 3 times in an hour with blood clots. G 25 P0

## 2024-02-05 NOTE — ED Notes (Addendum)
Pt provided urine specimen.

## 2024-02-06 ENCOUNTER — Emergency Department: Admitting: General Practice

## 2024-02-06 ENCOUNTER — Ambulatory Visit: Payer: Self-pay | Admitting: Family Medicine

## 2024-02-06 ENCOUNTER — Ambulatory Visit
Admission: EM | Admit: 2024-02-06 | Discharge: 2024-02-06 | Disposition: A | Discharge status: Home / Self Care | Attending: Emergency Medicine | Admitting: Emergency Medicine

## 2024-02-06 ENCOUNTER — Encounter
Admission: EM | Disposition: A | Discharge status: Home / Self Care | Payer: Self-pay | Source: Home / Self Care | Attending: Emergency Medicine

## 2024-02-06 DIAGNOSIS — O039 Complete or unspecified spontaneous abortion without complication: Secondary | ICD-10-CM

## 2024-02-06 DIAGNOSIS — O4690 Antepartum hemorrhage, unspecified, unspecified trimester: Secondary | ICD-10-CM

## 2024-02-06 DIAGNOSIS — Z3A01 Less than 8 weeks gestation of pregnancy: Secondary | ICD-10-CM | POA: Diagnosis not present

## 2024-02-06 LAB — POCT URINALYSIS DIPSTICK
Glucose, UA: NEGATIVE
Nitrite, UA: NEGATIVE
Protein, UA: POSITIVE — AB
Spec Grav, UA: 1.025 (ref 1.010–1.025)
Urobilinogen, UA: NEGATIVE U/dL — AB
pH, UA: 6 (ref 5.0–8.0)

## 2024-02-06 LAB — URINE CULTURE: Organism ID, Bacteria: NO GROWTH

## 2024-02-06 LAB — CBC WITH DIFFERENTIAL/PLATELET
Abs Immature Granulocytes: 0.05 K/uL (ref 0.00–0.07)
Basophils Absolute: 0.1 K/uL (ref 0.0–0.1)
Basophils Relative: 0 %
Eosinophils Absolute: 0.2 K/uL (ref 0.0–0.5)
Eosinophils Relative: 1 %
HCT: 32.7 % — ABNORMAL LOW (ref 36.0–46.0)
Hemoglobin: 10.9 g/dL — ABNORMAL LOW (ref 12.0–15.0)
Immature Granulocytes: 0 %
Lymphocytes Relative: 24 %
Lymphs Abs: 4.1 K/uL — ABNORMAL HIGH (ref 0.7–4.0)
MCH: 29.7 pg (ref 26.0–34.0)
MCHC: 33.3 g/dL (ref 30.0–36.0)
MCV: 89.1 fL (ref 80.0–100.0)
Monocytes Absolute: 1.2 K/uL — ABNORMAL HIGH (ref 0.1–1.0)
Monocytes Relative: 7 %
Neutro Abs: 12 K/uL — ABNORMAL HIGH (ref 1.7–7.7)
Neutrophils Relative %: 68 %
Platelets: 237 K/uL (ref 150–400)
RBC: 3.67 MIL/uL — ABNORMAL LOW (ref 3.87–5.11)
RDW: 13.6 % (ref 11.5–15.5)
Smear Review: NORMAL
WBC: 17.5 K/uL — ABNORMAL HIGH (ref 4.0–10.5)
nRBC: 0 % (ref 0.0–0.2)

## 2024-02-06 LAB — HCG, QUANTITATIVE, PREGNANCY: hCG, Beta Chain, Quant, S: 2523 m[IU]/mL — ABNORMAL HIGH (ref <5)

## 2024-02-06 LAB — ABO/RH: ABO/RH(D): A POS

## 2024-02-06 SURGERY — DILATION AND EVACUATION, UTERUS
Anesthesia: General

## 2024-02-06 MED ORDER — EPHEDRINE 5 MG/ML INJ
INTRAVENOUS | Status: AC
  Filled 2024-02-06: qty 5

## 2024-02-06 MED ORDER — FENTANYL CITRATE (PF) 100 MCG/2ML IJ SOLN
INTRAMUSCULAR | Status: AC
  Filled 2024-02-06: qty 2

## 2024-02-06 MED ORDER — LIDOCAINE HCL (PF) 2 % IJ SOLN
INTRAMUSCULAR | Status: DC | PRN
  Administered 2024-02-06: 50 mg

## 2024-02-06 MED ORDER — SODIUM CHLORIDE (PF) 0.9 % IJ SOLN
INTRAMUSCULAR | Status: AC
  Filled 2024-02-06: qty 20

## 2024-02-06 MED ORDER — FENTANYL CITRATE (PF) 100 MCG/2ML IJ SOLN
INTRAMUSCULAR | Status: DC | PRN
  Administered 2024-02-06 (×2): 50 ug via INTRAVENOUS

## 2024-02-06 MED ORDER — MIDAZOLAM HCL 5 MG/5ML IJ SOLN
INTRAMUSCULAR | Status: DC | PRN
  Administered 2024-02-06: 2 mg via INTRAVENOUS

## 2024-02-06 MED ORDER — ONDANSETRON 4 MG PO TBDP: 4.0000 mg | ORAL_TABLET | Freq: Three times a day (TID) | ORAL | 0 refills | Status: AC | PRN

## 2024-02-06 MED ORDER — OXYCODONE HCL 5 MG/5ML PO SOLN: 5.0000 mg | Freq: Once | ORAL | Status: AC | PRN

## 2024-02-06 MED ORDER — OXYCODONE-ACETAMINOPHEN 5-325 MG PO TABS: 1.0000 | ORAL_TABLET | Freq: Four times a day (QID) | ORAL | 0 refills | Status: AC | PRN

## 2024-02-06 MED ORDER — 0.9 % SODIUM CHLORIDE (POUR BTL) OPTIME
TOPICAL | Status: DC | PRN
  Administered 2024-02-06: 500 mL

## 2024-02-06 MED ORDER — PROPOFOL 10 MG/ML IV BOLUS
INTRAVENOUS | Status: DC | PRN
  Administered 2024-02-06: 200 mg via INTRAVENOUS

## 2024-02-06 MED ORDER — ONDANSETRON HCL 4 MG/2ML IJ SOLN
INTRAMUSCULAR | Status: AC
  Filled 2024-02-06: qty 2

## 2024-02-06 MED ORDER — MIDAZOLAM HCL 2 MG/2ML IJ SOLN
INTRAMUSCULAR | Status: AC
  Filled 2024-02-06: qty 2

## 2024-02-06 MED ORDER — OXYCODONE HCL 5 MG PO TABS
5.0000 mg | ORAL_TABLET | Freq: Once | ORAL | Status: AC | PRN
  Administered 2024-02-06: 5 mg via ORAL

## 2024-02-06 MED ORDER — VASOPRESSIN 20 UNIT/ML IV SOLN
INTRAVENOUS | Status: AC
  Filled 2024-02-06: qty 1

## 2024-02-06 MED ORDER — DEXAMETHASONE SOD PHOSPHATE PF 10 MG/ML IJ SOLN
INTRAMUSCULAR | Status: DC | PRN
  Administered 2024-02-06: 10 mg via INTRAVENOUS

## 2024-02-06 MED ORDER — FENTANYL CITRATE (PF) 100 MCG/2ML IJ SOLN
25.0000 ug | INTRAMUSCULAR | Status: DC | PRN
  Administered 2024-02-06 (×2): 25 ug via INTRAVENOUS

## 2024-02-06 MED ORDER — EPHEDRINE SULFATE (PRESSORS) 25 MG/5ML IV SOSY
PREFILLED_SYRINGE | INTRAVENOUS | Status: DC | PRN
  Administered 2024-02-06: 10 mg via INTRAVENOUS
  Administered 2024-02-06: 5 mg via INTRAVENOUS

## 2024-02-06 MED ORDER — DEXTROSE IN LACTATED RINGERS 5 % IV SOLN: INTRAVENOUS | Status: DC | PRN

## 2024-02-06 MED ORDER — HYDROMORPHONE HCL 1 MG/ML IJ SOLN
0.5000 mg | Freq: Once | INTRAMUSCULAR | Status: AC
  Administered 2024-02-06: 0.5 mg via INTRAVENOUS
  Filled 2024-02-06: qty 0.5

## 2024-02-06 MED ORDER — PHENYLEPHRINE HCL (PRESSORS) 10 MG/ML IV SOLN
INTRAVENOUS | Status: DC | PRN
  Administered 2024-02-06 (×3): 160 ug via INTRAVENOUS
  Administered 2024-02-06: 80 ug via INTRAVENOUS
  Administered 2024-02-06: 160 ug via INTRAVENOUS

## 2024-02-06 MED ORDER — VASOPRESSIN 20 UNIT/ML IV SOLN
INTRAVENOUS | Status: DC | PRN
  Administered 2024-02-06: 20 [IU]

## 2024-02-06 MED ORDER — GLYCOPYRROLATE 0.2 MG/ML IJ SOLN
INTRAMUSCULAR | Status: DC | PRN
  Administered 2024-02-06: .2 mg via INTRAVENOUS

## 2024-02-06 MED ORDER — PROPOFOL 10 MG/ML IV BOLUS
INTRAVENOUS | Status: AC
  Filled 2024-02-06: qty 20

## 2024-02-06 MED ORDER — SODIUM CHLORIDE 0.9 % IV SOLN
100.0000 mg | Freq: Once | INTRAVENOUS | Status: AC
  Administered 2024-02-06: 100 mg via INTRAVENOUS
  Filled 2024-02-06: qty 100

## 2024-02-06 MED ORDER — PHENYLEPHRINE 80 MCG/ML (10ML) SYRINGE FOR IV PUSH (FOR BLOOD PRESSURE SUPPORT)
PREFILLED_SYRINGE | INTRAVENOUS | Status: AC
  Filled 2024-02-06: qty 10

## 2024-02-06 MED ORDER — GLYCOPYRROLATE 0.2 MG/ML IJ SOLN
INTRAMUSCULAR | Status: AC
  Filled 2024-02-06: qty 1

## 2024-02-06 MED ORDER — LIDOCAINE HCL (PF) 2 % IJ SOLN
INTRAMUSCULAR | Status: AC
  Filled 2024-02-06: qty 5

## 2024-02-06 MED ORDER — ONDANSETRON HCL 4 MG/2ML IJ SOLN
INTRAMUSCULAR | Status: DC | PRN
  Administered 2024-02-06: 4 mg via INTRAVENOUS

## 2024-02-06 MED ORDER — OXYCODONE HCL 5 MG PO TABS
ORAL_TABLET | ORAL | Status: AC
  Filled 2024-02-06: qty 1

## 2024-02-06 SURGICAL SUPPLY — 24 items
DRSG TELFA 3X8 NADH STRL (GAUZE/BANDAGES/DRESSINGS) IMPLANT
FILTER UTR ASPR SPEC (MISCELLANEOUS) ×1 IMPLANT
GAUZE 4X4 16PLY ~~LOC~~+RFID DBL (SPONGE) IMPLANT
GLOVE BIO SURGEON STRL SZ7 (GLOVE) ×1 IMPLANT
GLOVE BIOGEL PI IND STRL 7.5 (GLOVE) ×1 IMPLANT
GOWN STRL REUS W/ TWL LRG LVL3 (GOWN DISPOSABLE) ×2 IMPLANT
HANDLE YANKAUER SUCT BULB TIP (MISCELLANEOUS) IMPLANT
KIT BERKELEY 1ST TRIMESTER 3/8 (MISCELLANEOUS) ×1 IMPLANT
KIT TURNOVER CYSTO (KITS) ×1 IMPLANT
MANIFOLD NEPTUNE II (INSTRUMENTS) ×1 IMPLANT
NDL HYPO 22X1.5 SAFETY MO (MISCELLANEOUS) IMPLANT
NEEDLE HYPO 22X1.5 SAFETY MO (MISCELLANEOUS) IMPLANT
PACK DNC HYST (MISCELLANEOUS) ×1 IMPLANT
PAD OB MATERNITY 11 LF (PERSONAL CARE ITEMS) ×1 IMPLANT
PAD PREP OB/GYN DISP 24X41 (PERSONAL CARE ITEMS) ×1 IMPLANT
SCRUB CHG 4% DYNA-HEX 4OZ (MISCELLANEOUS) ×1 IMPLANT
SET BERKELEY SUCTION TUBING (SUCTIONS) ×1 IMPLANT
SET CYSTO IRRIGATION (SET/KITS/TRAYS/PACK) IMPLANT
SOLN STERILE WATER 500 ML (IV SOLUTION) ×1 IMPLANT
SOLUTION PREP PVP 2OZ (MISCELLANEOUS) ×1 IMPLANT
SYR 10ML LL (SYRINGE) IMPLANT
TOWEL OR 17X26 4PK STRL BLUE (TOWEL DISPOSABLE) ×1 IMPLANT
TRAP FLUID SMOKE EVACUATOR (MISCELLANEOUS) ×1 IMPLANT
VACURETTE 7MM F TIP STRL (CANNULA) IMPLANT

## 2024-02-06 NOTE — Transfer of Care (Signed)
 Immediate Anesthesia Transfer of Care Note  Patient: Kathleen Owen  Procedure(s) Performed: DILATION AND EVACUATION, UTERUS  Patient Location: PACU  Anesthesia Type:General  Level of Consciousness: sedated  Airway & Oxygen Therapy: Patient Spontanous Breathing and Patient connected to face mask oxygen  Post-op Assessment: Report given to RN and Post -op Vital signs reviewed and stable  Post vital signs: Reviewed  Last Vitals:  Vitals Value Taken Time  BP 97/56   Temp    Pulse 77 02/06/24 07:03  Resp 12   SpO2 100 % 02/06/24 07:03  Vitals shown include unfiled device data.  Last Pain:         Complications: No notable events documented.

## 2024-02-06 NOTE — ED Provider Notes (Signed)
 [facility] Provider Note    None    (approximate)   History   Vaginal Bleeding   HPI  Kathleen Owen is a 33 y.o. female with prior miscarriages who comes in with concerns for vaginal bleeding.  Patient reports bleeding 2-3 pads an hour over the past few hours.  She denies ever having a miscarriage like this before.  She has been taking oxycodone  for pain but significant pain in her lower abdomen.  Patient was seen on 12/12 and diagnosed with threatened miscarriage.  Hemoglobin was 11.5 patient is a positive.  hCG was 7000.  Ultrasound Definite intrauterine pregnancy with estimated gestational age of [redacted] weeks 0 days based on crown-rump length. Cardiac activity is not visualized and likely too early to detect.  She followed up with a recheck on 12/13 and found to have an hCG of 4000 Hemoglobin yesterday was 11.6   Physical Exam   Triage Vital Signs: ED Triage Vitals  Encounter Vitals Group     BP 02/05/24 2255 109/71     Girls Systolic BP Percentile --      Girls Diastolic BP Percentile --      Boys Systolic BP Percentile --      Boys Diastolic BP Percentile --      Pulse Rate 02/05/24 2247 90     Resp 02/05/24 2247 18     Temp 02/05/24 2247 98.9 F (37.2 C)     Temp Source 02/05/24 2247 Oral     SpO2 02/05/24 2247 100 %     Weight 02/05/24 2247 144 lb (65.3 kg)     Height 02/05/24 2247 5' 3 (1.6 m)     Head Circumference --      Peak Flow --      Pain Score 02/05/24 2251 7     Pain Loc --      Pain Education --      Exclude from Growth Chart --     Most recent vital signs: Vitals:   02/05/24 2247 02/05/24 2255  BP:  109/71  Pulse: 90   Resp: 18   Temp: 98.9 F (37.2 C)   SpO2: 100%      General: Awake, no distress.  CV:  Good peripheral perfusion.  Resp:  Normal effort.  Abd:  No distention.  Reported tenderness in the lower abdomen without any rebound, guarding Other:  Significant amount of pooling in the vaginal vault removal  of clots but still 5 fox swabs of bleeding and still having more blood in the vault   ED Results / Procedures / Treatments   Labs (all labs ordered are listed, but only abnormal results are displayed) Labs Reviewed  CBC WITH DIFFERENTIAL/PLATELET - Abnormal; Notable for the following components:      Result Value   WBC 17.5 (*)    RBC 3.67 (*)    Hemoglobin 10.9 (*)    HCT 32.7 (*)    Neutro Abs 12.0 (*)    Lymphs Abs 4.1 (*)    Monocytes Absolute 1.2 (*)    All other components within normal limits  URINALYSIS, ROUTINE W REFLEX MICROSCOPIC - Abnormal; Notable for the following components:   Color, Urine YELLOW (*)    APPearance HAZY (*)    Hgb urine dipstick LARGE (*)    Protein, ur 100 (*)    All other components within normal limits  POC URINE PREG, ED - Abnormal; Notable for the following components:   Preg Test,  Ur POSITIVE (*)    All other components within normal limits  ABO/RH      PROCEDURES:  Critical Care performed: No  Procedures   MEDICATIONS ORDERED IN ED: Medications - No data to display   IMPRESSION / MDM / ASSESSMENT AND PLAN / ED COURSE  I reviewed the triage vital signs and the nursing notes.   Patient's presentation is most consistent with acute presentation with potential threat to life or bodily function.   Patient comes in with concerns for vaginal bleeding vitals are stable but given significant amount of bleeding I did order hemoglobin, repeat hCG.  Her white count is elevated but she has no fever seems less likely septic abortion.  Her hCG is downtrending and urine with significant RBCs in it most likely secondary to vaginal bleeding.  She is a positive does not need RhoGAM.  Pelvic exam was with significant amount of bleeding so I do feel like we need to talk to OB  4:49 AM D/w jennifer who recommends d/w Dr verdon  Dr. Verdon is agreeable to [facility] given the amount of vaginal bleeding. Pt to OR  The patient is on the cardiac monitor  to evaluate for evidence of arrhythmia and/or significant heart rate changes.      FINAL CLINICAL IMPRESSION(S) / ED DIAGNOSES   Final diagnoses:  Vaginal bleeding in pregnancy  Miscarriage     Rx / DC Orders   ED Discharge Orders     None        Note:  This document was prepared using Dragon voice recognition software and may include unintentional dictation errors.   Ernest Ronal BRAVO, MD 02/06/24 [phone]

## 2024-02-06 NOTE — Discharge Instructions (Signed)
Discharge instructions after a Dilation and Curettage ° °Signs and Symptoms to Report ° °Call our office at [phone] if you have any of the following:  ° °• Fever over 100.4 degrees or higher °• Severe stomach pain not relieved with pain medications °• Bright red bleeding that’s heavier than a period that does not slow with rest after the first 24 hours °• To go the bathroom a lot (frequency), you can’t hold your urine (urgency), or it hurts when you empty your bladder (urinate) °• Chest pain °• Shortness of breath °• Pain in the calves of your legs °• Severe nausea and vomiting not relieved with anti-nausea medications °• Any concerns ° °What You Can Expect after Surgery °• You may see some pink tinged, bloody fluid. This is normal. You may also have cramping for several days.  ° °Activities after Your Discharge °Follow these guidelines to help speed your recovery at home: °• Don’t drive if you are in pain or taking narcotic pain medicine. You may drive when you can safely slam on the brakes, turn the wheel forcefully, and rotate your torso comfortably. This is typically 4-7 days. Practice in a parking lot or side street prior to attempting to drive regularly.  °• Ask others to help with household chores until you feel up to doing tasks. °• Don’t do strenuous activities, exercises, or sports like vacuuming, tennis, squash, etc. until your doctor says it is safe to do so. °• Walk as you feel able. Rest often since it may take a week or two for your energy level to return to normal.  °• You may climb stairs °• Avoid constipation: °  -Eat fruits, vegetables, and whole grains. Eat small meals as your appetite will take time to return to normal. °  -Drink 6 to 8 glasses of water each day unless your doctor has told you to limit your fluids. °  -Use a laxative or stool softener as needed if constipation becomes a problem. You may take Miralax, metamucil, Citrucil, Colace, Senekot, FiberCon, etc. If this does not  relieve the constipation, try two tablespoons of Milk Of Magnesia every 8 hours until your bowels move.  °• You may shower.  °• Do not get in a hot tub, swimming pool, etc. until your doctor agrees. °• Do not douche, use tampons, or have sex until you stop spotting, usually about 2 weeks. °• Take your pain medicine when you need it. The medicine may not work as well if the pain is bad. ° °Take the medicines you were taking before surgery. Other medications you might need are pain medications (ibuprofen), medications for constipation (Colace) and nausea medications (Zofran).  ° ° ° ° ° °Coping with Pregnancy Loss °Pregnancy loss can happen any time during a pregnancy. Often the cause is not known. It is rarely because of anything you did. Pregnancy loss in early pregnancy (during the first trimester) is called a miscarriage. This type of pregnancy loss is the most common.  ° °Any pregnancy loss can be devastating. You will need to recover both physically and emotionally. Most women are able to get pregnant again after a pregnancy loss and deliver a healthy baby. ° °How to manage emotional recovery ° °Pregnancy loss is very hard emotionally. You may feel many different emotions while you grieve. You may feel sad and angry. You may also feel guilty. It is normal to have periods of crying. Emotional recovery can take longer than physical recovery. It is different for everyone.   Some women may feel back to normal quickly and others take longer. °Taking these steps can help you cope: °· Remember that it is unlikely you did anything to cause the pregnancy loss. °· Share your thoughts and feelings with friends, family, and your partner. Remember that your partner is also recovering emotionally. °· Make sure you have a good support system, and do not spend too much time alone. °· Meet with a pregnancy loss counselor or join a pregnancy loss support group. °· Get enough sleep and eat a healthy diet. Return to regular exercise  when you have recovered physically. °· Do not use drugs or alcohol to manage your emotions. °· Consider seeing a mental health professional to help you recover emotionally. °· Ask a friend or loved one to help you decide what to do with any clothing and nursery items you received for your baby. ° °How to recognize emotional stress °It is normal to have emotional stress after a pregnancy loss. But emotional stress that lasts a long time or becomes severe requires treatment. Watch out for these signs of severe emotional stress: °· Sadness, anger, or guilt that is not going away and is interfering with your normal activities. °· Relationship problems that have occurred or gotten worse since the pregnancy loss. °· Signs of depression that last longer than 2 weeks. These may include: °? Sadness. °? Anxiety. °? Hopelessness. °? Loss of interest in activities you enjoy. °? Inability to concentrate. °? Trouble sleeping or sleeping too much. °? Loss of appetite or overeating. °? Thoughts of death or of hurting yourself. °Follow these instructions at home: °Medicines °· Take over-the-counter and prescription medicines only as told by your health care provider. °Activity °· Rest at home until your energy level returns. Return to your normal activities as told by your health care provider. Ask your health care provider what activities are safe for you. °General instructions °· Keep all follow-up visits as told by your health care provider. This is important. °· It may be helpful to meet with others who have experienced pregnancy loss. Ask your health care provider about support groups and resources. °· To help you and your partner with the process of grieving, talk with your health care provider or seek counseling. °· When you are ready, meet with your health care provider to discuss steps to take for a future pregnancy. °Where to find more information °· [place] Department of Health and Human Services Office on Women's Health:  www.womenshealth.gov °· American Pregnancy Association: www.americanpregnancy.org °Contact a health care provider if: °· You continue to experience grief, sadness, or lack of motivation for everyday activities, and those feelings do not improve over time. °· You are struggling to recover emotionally, especially if you are using alcohol or substances to help. °Get help right away if: °· You have thoughts of hurting yourself or others. °If you ever feel like you may hurt yourself or others, or have thoughts about taking your own life, get help right away. You can go to your nearest emergency department or call: °· Your local emergency services (911 in the [place] °· A suicide crisis helpline, such as the National Suicide Prevention Lifeline at 1-800-273-8255. This is open 24 hours a day. °Summary °· Any pregnancy loss can be difficult physically and emotionally. °· You may experience many different emotions while you grieve. Emotional recovery can last longer than physical recovery. °· It is normal to have emotional stress after a pregnancy loss. But emotional stress that lasts a   long time or becomes severe requires treatment. °· See your health care provider if you are struggling emotionally after a pregnancy loss. °This information is not intended to replace advice given to you by your health care provider. Make sure you discuss any questions you have with your health care provider. °Document Released: 04/22/2017 Document Revised: 04/22/2017 Document Reviewed: 04/22/2017 °Elsevier Interactive Patient Education © 2019 [employer] ° ° °

## 2024-02-06 NOTE — Anesthesia Preprocedure Evaluation (Signed)
 Anesthesia Evaluation  Patient identified by MRN, date of birth, ID band Patient awake    Reviewed: Allergy & Precautions, H&P , NPO status , Patient's Chart, lab work & pertinent test results  History of Anesthesia Complications Negative for: history of anesthetic complications  Airway Mallampati: III  TM Distance: >3 FB Neck ROM: full    Dental  (+) Chipped, Poor Dentition   Pulmonary neg shortness of breath, asthma , neg recent URI, Patient abstained from smoking., former smoker          Cardiovascular Exercise Tolerance: Good (-) angina (-) Past MI and (-) DOE negative cardio ROS      Neuro/Psych  PSYCHIATRIC DISORDERS Anxiety Depression    negative neurological ROS  negative psych ROS   GI/Hepatic negative GI ROS, Neg liver ROS,neg GERD  ,,  Endo/Other  negative endocrine ROS    Renal/GU      Musculoskeletal   Abdominal   Peds  Hematology  (+) Blood dyscrasia, anemia   Anesthesia Other Findings Past Medical History: 07/30/2014: Airway hyperreactivity August 2016: Bacterial vaginosis     Comment:  recurrent BV 07/30/2014: CD (Crohn's disease) (HCC) Jan 2016: Crohn's disease (HCC) No date: Depression 07/18/2014: Miscarriage  Past Surgical History: No date: DILATION AND CURETTAGE OF UTERUS 12/02/2016: DILATION AND EVACUATION; N/A     Comment:  Procedure: DILATATION AND EVACUATION;  Surgeon: Janit Alm Agent, MD;  Location: [facility] ORS;  Service:               Gynecology;  Laterality: N/A; 06/30/2017: DILATION AND EVACUATION; N/A     Comment:  Procedure: DILATATION AND EVACUATION;  Surgeon: Janit Alm Agent, MD;  Location: [facility] ORS;  Service:               Gynecology;  Laterality: N/A; No date: TONSILLECTOMY  BMI    Body Mass Index: 26.56 kg/m      Reproductive/Obstetrics negative OB ROS                              Anesthesia Physical Anesthesia  Plan  ASA: 2  Anesthesia Plan: General ETT   Post-op Pain Management:    Induction: Intravenous  PONV Risk Score and Plan: 4 or greater and Ondansetron , Dexamethasone  and Midazolam   Airway Management Planned: Oral ETT  Additional Equipment:   Intra-op Plan:   Post-operative Plan: Extubation in OR  Informed Consent: I have reviewed the patients History and Physical, chart, labs and discussed the procedure including the risks, benefits and alternatives for the proposed anesthesia with the patient or authorized representative who has indicated his/her understanding and acceptance.     Dental Advisory Given  Plan Discussed with: Anesthesiologist, CRNA and Surgeon  Anesthesia Plan Comments: (Patient consented for risks of anesthesia including but not limited to:  - adverse reactions to medications - damage to eyes, teeth, lips or other oral mucosa - nerve damage due to positioning  - sore throat or hoarseness - Damage to heart, brain, nerves, lungs, other parts of body or loss of life  Patient voiced understanding and assent.)        Anesthesia Quick Evaluation

## 2024-02-06 NOTE — H&P (Signed)
 Consult History and Physical   SERVICE: Gynecology   Patient Name: Kathleen Owen Patient MRN:   [id number]  CC: Vaginal bleeding and pelvic pain  HPI: Kathleen Owen is a 33 y.o. 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 On ER physician's exam, bleeding significant with drop in hbg since yesterday of 1g and tachycardia. WBC also abnormal, elevated to 17.5 but afebrile.   WBC yesterday 16.1 in the ED for same. She was discharged with f/u plan for [facility] later this week. She was in pain at that time and required morphine  in the ED, discharged home with toradol  and reglan , which she isn't taking because the pharmacist mentioned these were not normal meds after a miscarriage.  She is being treated for UTI and yeast, but the bactrim  made her nauseated and she hasn't completed the course. No dysuria currently.  Does have a hx of Crohns  Review of Systems: positives in bold GEN:   fevers, chills, weight changes, appetite changes, fatigue, night sweats HEENT:  HA, vision changes, hearing loss, congestion, rhinorrhea, sinus pressure, dysphagia CV:   CP, palpitations PULM:  SOB, cough GI:  abd pain, N/V/D/C vaginal bleeding- significant GU:  dysuria, urgency, frequency MSK:  arthralgias, myalgias, back pain, swelling SKIN:  rashes, color changes, pallor NEURO:  numbness, weakness, tingling, seizures, dizziness, tremors PSYCH:  depression, anxiety, behavioral problems, confusion  HEME/LYMPH:  easy bruising or bleeding ENDO:  heat/cold intolerance  Past Obstetrical History: OB History     Gravida  20   Para  0   Term  0   Preterm  0   AB  19   Living  0      SAB  17   IAB  1   Ectopic  0   Multiple  0   Live Births  0           Past Gynecologic History: Patient's last menstrual period was 12/03/2023 (exact date).   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   Bacterial vaginosis August 2016   recurrent BV   CD (Crohn's disease) (HCC) 07/30/2014   Crohn's disease (HCC) Jan 2016   Depression    Miscarriage 07/18/2014    Past Surgical History:   Past Surgical History:  Procedure Laterality Date   DILATION AND CURETTAGE OF UTERUS     DILATION AND EVACUATION N/A 12/02/2016   Procedure: DILATATION AND EVACUATION;  Surgeon: Janit Alm Agent, MD;  Location: [facility] ORS;  Service: Gynecology;  Laterality: N/A;   DILATION AND EVACUATION N/A 06/30/2017   Procedure: DILATATION AND EVACUATION;  Surgeon: Janit Alm Agent, MD;  Location: [facility] OR[facility];  Service: Gynecology;  Laterality: N/A;   DILATION AND EVACUATION N/A 12/06/2018   Procedure: SUCTION D&C;  Surgeon: Lake Read, MD;  Location: [facility] OR[facility];  Service: Gynecology;  Laterality: N/A;   MYOMECTOMY N/A 04/06/2019   Procedure: MYOMECTOMY VIA LAPAROTOMY;  Surgeon: Lake Read, MD;  Location: [facility] ORS;  Service: Gynecology;  Laterality: N/A;   OVARIAN CYST SURGERY  07/2022   TONSILLECTOMY      Family History:  family history includes Cancer in her paternal grandfather and paternal grandmother; Healthy in her father and mother; Heart failure in her maternal grandmother and maternal uncle; Hypertension in her father and paternal grandmother; Kidney disease in her maternal uncle; Thyroid disease in her paternal aunt.  Social History:  Social History   Socioeconomic  History   Marital status: Married    Spouse name: Not on file   Number of children: Not on file   Years of education: Not on file   Highest education level: Not on file  Occupational History   Occupation: unemployed  Tobacco Use   Smoking status: Former    Types: Cigars   Smokeless tobacco: Never   Tobacco comments:    black and mild 2-3 cigars a day. Stopped cigarettes 2023. Stopped vaping 02/2023.   Vaping Use   Vaping status: Former  Substance and Sexual Activity   Alcohol use: Not  Currently    Alcohol/week: 2.0 standard drinks of alcohol    Types: 2 Cans of beer per week    Comment: last use 09/2023   Drug use: Not Currently    Types: Marijuana    Comment: last use age 73   Sexual activity: Yes    Partners: Male    Birth control/protection: None, Condom, Injection, Pill    Comment: last depo 2023  Other Topics Concern   Not on file  Social History Narrative   Not on file   Social Drivers of Health   Tobacco Use: High Risk (02/05/2024)   Received from [facility]   Patient History    Smoking Tobacco Use: Every Day    Smokeless Tobacco Use: Never    Passive Exposure: Not on file  Financial Resource Strain: Patient Declined (02/01/2024)   Received from [facility]   Overall Financial Resource Strain (CARDIA)    How hard is it for you to pay for the very basics like food, housing, medical care, and heating?: Patient declined  Food Insecurity: Patient Declined (02/01/2024)   Received from [facility]   Epic    Within the past 12 months, you worried that your food would run out before you got the money to buy more.: Patient declined    Within the past 12 months, the food you bought just didn't last and you didn't have money to get more.: Patient declined  Transportation Needs: Patient Declined (02/01/2024)   Received from [facility] - Transportation    Lack of Transportation (Medical): Patient declined    Lack of Transportation (Non-Medical): Patient declined  Physical Activity: Not on file  Stress: No Stress Concern Present (08/11/2023)   [employer] - Occupational Stress Questionnaire    Feeling of Stress: Not at all  Social Connections: Not on file  Intimate Partner Violence: Not At Risk (01/24/2024)   Epic    Fear of Current or Ex-Partner: No    Emotionally Abused: No    Physically Abused: No    Sexually Abused: No  Depression (PHQ2-9): Low Risk (01/24/2024)   Depression (PHQ2-9)    PHQ-2 Score:  0  Recent Concern: Depression (PHQ2-9) - High Risk (11/12/2023)   Depression (PHQ2-9)    PHQ-2 Score: 17  Alcohol Screen: Low Risk (08/11/2023)   Alcohol Screen    Last Alcohol Screening Score (AUDIT): 0  Housing: Unknown (08/11/2023)   Epic    Unable to Pay for Housing in the Last Year: No    Number of Times Moved in the Last Year: Not on file    Homeless in the Last Year: No  Utilities: Patient Declined (02/01/2024)   Received from [facility]   Utilities    Within the past 12 months, have you been unable to get utilities(heat, electricity) when it was really needed?: Patient  refused  Health Literacy: Adequate Health Literacy (08/11/2023)   B1300 Health Literacy    Frequency of need for help with medical instructions: Never    Home Medications:  Medications reconciled in EPIC  Medications Ordered Prior to Encounter[1]  Allergies:  Allergies[2]  Physical Exam:  Temp:  [98.9 F (37.2 C)] 98.9 F (37.2 C) (12/13 2247) Pulse Rate:  [90] 90 (12/13 2247) Resp:  [18] 18 (12/13 2247) BP: (109)/(71) 109/71 (12/13 2255) SpO2:  [100 %] 100 % (12/13 2247) Weight:  [65.3 kg] 65.3 kg (12/13 2247)   General Appearance:  Well developed, well nourished, no acute distress, alert and oriented x3 HEENT:  Normocephalic atraumatic, extraocular movements intact, moist mucous membranes Cardiovascular:  Normal S1/S2, regular rate and rhythm, no murmurs Pulmonary:  clear to auscultation, no wheezes, rales or rhonchi, symmetric air entry, good air exchange Abdomen:  Bowel sounds present, soft, nontender, nondistended, no abnormal masses, no epigastric pain Extremities:  Full range of motion, no pedal edema, 2+ distal pulses, no tenderness Skin:  normal coloration and turgor, no rashes, no suspicious skin lesions noted  Neurologic:  Cranial nerves 2-12 grossly intact, normal muscle tone, strength 5/5 all four extremities Psychiatric:  Normal mood and affect, appropriate, no AH/VH Pelvic:   deferred- large clot expelled while we were talking   Labs/Studies:   CBC and Coags:  Lab Results  Component Value Date   WBC 17.5 (H) 02/05/2024   NEUTOPHILPCT 68 02/05/2024   EOSPCT 1 02/05/2024   BASOPCT 0 02/05/2024   LYMPHOPCT 24 02/05/2024   HGB 10.9 (L) 02/05/2024   HCT 32.7 (L) 02/05/2024   MCV 89.1 02/05/2024   PLT 237 02/05/2024   INR 1.0 11/30/2016   CMP:  Lab Results  Component Value Date   NA 138 11/12/2023   K 4.0 11/12/2023   CL 99 11/12/2023   CO2 24 11/12/2023   BUN 10 11/12/2023   CREATININE 0.63 11/12/2023   CREATININE 0.50 01/11/2023   CREATININE 0.54 01/10/2023   PROT 7.3 11/12/2023   BILITOT 0.5 11/12/2023   ALT 9 11/12/2023   AST 10 11/12/2023   ALKPHOS 49 11/12/2023   Other Labs:  Lab Results  Component Value Date   HCGBETAQNT 2,523 (H) 02/05/2024   HCGBETAQNT <1 01/15/2022   HCGBETAQNT 144 (H) 03/22/2020    Other Imaging: US  OB LESS THAN 14 WEEKS WITH OB TRANSVAGINAL Result Date: 01/23/2024 Images from the original result were not included. FIRST TRIMESTER ULTRASOUND REPORT Patient Name: Kathleen Owen DOB: 17-Jun-1990 MRN: [id number] Location: [facility] Date of Service: 01/19/2024 Ordering Provider: Harland Birkenhead, MD Indications:F/U to confirm progression of pregnancy. Findings: Singleton intrauterine pregnancy is visualized with a CRL consistent with [redacted]w[redacted]d gestation, giving an (U/S) EDD of 09/09/24. The (U/S) EDD is consistent with the clinically established EDD of 09/05/24 based on LMP. FHR: None detected. CRL measurement: 5.5 mm Yolk sac is visualized and appears abnormal (Large). Amnion: visualized and appears normal Right Ovary is normal in appearance. Left Ovary is normal appearance. Corpus luteal cyst:  Right ovary Survey of the adnexa demonstrates no adnexal masses. There is no free peritoneal fluid in the cul de sac. Impression: 1. [redacted]w[redacted]d Non- Viable Singleton Intrauterine pregnancy by U/S. 2. Recommend EDD of 09/09/24 based  on U/S. 3. Yolk sac appears large. 4. There is a discrepancy in dates as well. Patient should be [redacted]w[redacted]d  today based off of initial U/S that was performed on: 01/04/24. Recommendations: 1.Clinical correlation with the patient's History and  Physical Exam. 2. Dr. Starla spoke with patient and reviewed results (probably non-viable pregnancy). At this time the patient doesn't want to do anything. [place] Kathleen Owen, RDMS (AB,OB,BR),RVT Clinical Impression:  Prior US  reviewed Early pregnancy loss, miscarriage, with retention of the embryo Normal general sonographic findings  This recommendation follows SRU consensus guidelines: Diagnostic Criteria for Nonviable Pregnancy Early in the First Trimester. Kathleen Owen Med 2013; [id number].  Jennifer M Ozan 01/23/2024 5:33 PM    Assessment / Plan:   Kathleen Owen is a 32 y.o. [id number] who presents with another spontaneous abortion  Spontaneous miscarriage: Causes of spontaneous miscarriage discussed with patient, including prevalence, common causes, and the expectation that this event does not increase the chance that she will miscarry again in the future. Emotional support given.  Management options discussed, including: Expectant, medical and surgical.  Pt has opted for: suction D&C  Consents signed today. Risks of surgery were discussed with the patient including but not limited to: bleeding which may require transfusion; infection which may require antibiotics; injury to uterus or surrounding organs; intrauterine scarring which may impair future fertility; need for additional procedures including laparotomy or laparoscopy; and other postoperative/anesthesia complications. Written informed consent was obtained.  This is an unscheduled urgent surgery. She will have a postop visit in 2 weeks to review operative findings and pathology.     [1]  No current facility-administered medications on file prior to encounter.   Current Outpatient Medications on File  Prior to Encounter  Medication Sig Dispense Refill   Prenatal Vit-Fe Fumarate-FA (MULTIVITAMIN-PRENATAL) 27-0.8 MG TABS tablet Take 1 tablet by mouth daily at 12 noon.     Prenatal Vit-Fe Fumarate-FA (PRENATAL VITAMINS) 27-0.8 MG TABS Take by mouth.     progesterone  (PROMETRIUM ) 200 MG capsule 1 tablet orally daily 90 capsule 1   sulfamethoxazole -trimethoprim  (BACTRIM  DS) 800-160 MG tablet Take 1 tablet by mouth 2 (two) times daily for 10 days. 20 tablet 0  [2]  Allergies Allergen Reactions   Aspirin Other (See Comments)    Chron's Disease Chron's Disease Other reaction(s): Other (See Comments) Chron's Disease   Ibuprofen  Other (See Comments)    Chron's Disease Chron's Disease Other reaction(s): Other (See Comments), Other (See Comments) Chron's Disease blow flare per pt   Amoxicillin Rash and Other (See Comments)    Has patient had a PCN reaction causing immediate rash, facial/tongue/throat swelling, SOB or lightheadedness with hypotension: Unknown Has patient had a PCN reaction causing severe rash involving mucus membranes or skin necrosis: Unknown Has patient had a PCN reaction that required hospitalization: Unknown Has patient had a PCN reaction occurring within the last 10 years: No If all of the above answers are NO, then may proceed with Cephalosporin use.  Other reaction(s): Other (See Comments) Has patient had a PCN reaction causing immediate rash, facial/tongue/throat swelling, SOB or lightheadedness with hypotension: Unknown Has patient had a PCN reaction causing severe rash involving mucus membranes or skin necrosis: Unknown Has patient had a PCN reaction that required hospitalization: Unknown Has patient had a PCN reaction occurring within the last 10 years: No If all of the above answers are NO, then may proceed with Cephalosporin use.

## 2024-02-06 NOTE — Op Note (Addendum)
 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  Procedure: 1. Suction D&C 2. SNP microarray sent  Surgeon: Heather Penton, MD  Assistant(s):  None  Anesthesia: General LMA anesthesia  Anesthesiologist: Leavy Ned, MD Anesthesiologist: Leavy Ned, MD CRNA: Leontine Katz, CRNA  Estimated Blood Loss:  less than 50 mL         Total IV Fluids: 700ml  Urine Output: 20ml         Specimens: Products of conception         Complications:  None; patient tolerated the procedure well.         Disposition: PACU - hemodynamically stable.         Condition: stable  Findings: Uterus measuring 6 weeks; normal cervix, vagina, perineum.   Indication for procedure/Consents: 33 y.o. [id number]  here for urgent unscheduled surgery for the aforementioned diagnoses.     Risks of surgery were discussed with the patient including but not limited to: bleeding which may require transfusion; infection which may require antibiotics; injury to uterus or surrounding organs; intrauterine scarring which may impair future fertility; need for additional procedures including laparotomy or laparoscopy; and other postoperative/anesthesia complications. Written informed consent was obtained.    Procedure Details:   The patient received oral antibiotics while in the preoperative area.  She was then taken to the operating room where general anesthesia was administered and was found to be adequate.  After a formal and adequate timeout was performed, she was placed in the dorsal lithotomy position and examined with the above findings. She was then prepped and draped in the sterile manner.   Her bladder was catheterized for an estimated amount of clear, yellow urine. A speculum was then placed in the patient's vagina and a single tooth tenaculum was applied to the  anterior lip of the cervix.    No uterine sounding was performed on this pregnant uterus. Her cervix was serially dilated to accommodate a 7 sized flexible suction curette.  A sharp curettage was then performed until there was a gritty texture in all four quadrants.  The tenaculum was removed from the anterior lip of the cervix and the vaginal speculum was removed after noting good hemostasis. The patient tolerated the procedure well and was taken to the recovery area awake, extubated and in stable condition.  The patient will be discharged to home as per PACU criteria.  She received iv doxycycline  at the conclusion of the procedure, as she was nauseated prior to and it took a while for pharmacy to send the iv version of this ppx med. Routine postoperative instructions given.  She was prescribed Percocet, zofran   and Colace.  She will follow up in the clinic in two weeks for postoperative evaluation.

## 2024-02-06 NOTE — Anesthesia Procedure Notes (Signed)
 Procedure Name: LMA Insertion Date/Time: 02/06/2024 6:15 AM  Performed by: Leontine Katz, CRNAPre-anesthesia Checklist: Patient identified, Patient being monitored, Timeout performed, Emergency Drugs available and Suction available Patient Re-evaluated:Patient Re-evaluated prior to induction Oxygen Delivery Method: Circle system utilized Preoxygenation: Pre-oxygenation with 100% oxygen Induction Type: IV induction Ventilation: Mask ventilation without difficulty LMA: LMA inserted LMA Size: 3.0 Tube type: Oral Number of attempts: 1 Placement Confirmation: positive ETCO2 and breath sounds checked- equal and bilateral Tube secured with: Tape Dental Injury: Teeth and Oropharynx as per pre-operative assessment

## 2024-02-07 ENCOUNTER — Encounter: Payer: Self-pay | Admitting: Obstetrics and Gynecology

## 2024-02-07 NOTE — Anesthesia Postprocedure Evaluation (Signed)
 Anesthesia Post Note  Patient: Kathleen Owen  Procedure(s) Performed: DILATION AND EVACUATION, UTERUS  Patient location during evaluation: PACU Anesthesia Type: General Level of consciousness: awake and alert Pain management: pain level controlled Vital Signs Assessment: post-procedure vital signs reviewed and stable Respiratory status: spontaneous breathing, nonlabored ventilation, respiratory function stable and patient connected to nasal cannula oxygen Cardiovascular status: blood pressure returned to baseline and stable Postop Assessment: no apparent nausea or vomiting Anesthetic complications: no   No notable events documented.   Last Vitals:  Vitals:   02/06/24 0845 02/06/24 0902  BP: (!) 95/57 96/62  Pulse: (!) 109 95  Resp: (!) 27 16  Temp:    SpO2: 98% 97%    Last Pain:  Vitals:   02/06/24 0902  TempSrc:   PainSc: 7                  Debby Mines

## 2024-02-08 ENCOUNTER — Encounter: Payer: Self-pay | Admitting: Obstetrics and Gynecology

## 2024-02-08 LAB — CERVICOVAGINAL ANCILLARY ONLY
Bacterial Vaginitis (gardnerella): POSITIVE — AB
Candida Glabrata: NEGATIVE
Candida Vaginitis: POSITIVE — AB
Chlamydia: NEGATIVE
Comment: NEGATIVE
Comment: NEGATIVE
Comment: NEGATIVE
Comment: NEGATIVE
Comment: NEGATIVE
Comment: NORMAL
Neisseria Gonorrhea: NEGATIVE
Trichomonas: NEGATIVE

## 2024-02-08 LAB — SURGICAL PATHOLOGY

## 2024-02-22 ENCOUNTER — Encounter: Admitting: Certified Nurse Midwife
